# Patient Record
Sex: Male | Born: 1935 | Race: White | Hispanic: No
Health system: Western US, Academic
[De-identification: ages and names within clinical notes are randomized; demographics above are authoritative.]

## PROBLEM LIST (undated history)

## (undated) DIAGNOSIS — M629 Disorder of muscle, unspecified: Secondary | ICD-10-CM

## (undated) DIAGNOSIS — I719 Aortic aneurysm of unspecified site, without rupture: Secondary | ICD-10-CM

## (undated) DIAGNOSIS — G4730 Sleep apnea, unspecified: Secondary | ICD-10-CM

## (undated) HISTORY — PX: PR REVJ TOT KNEE ARTHRP FEM&ENTIRE TIBIAL COMPONE: 27487

## (undated) HISTORY — PX: SURGICAL HX OTHER: 99

## (undated) HISTORY — PX: PR RPR 1ST INGUN HRNA AGE 6 MO-5 YRS REDUCIBLE: 49500

## (undated) HISTORY — DX: Aortic aneurysm of unspecified site, without rupture: I71.9

## (undated) HISTORY — PX: VASECTOMY W/TRAY: 6200

## (undated) HISTORY — DX: Sleep apnea, unspecified: G47.30

## (undated) HISTORY — PX: LIGATION PRQ VAS DEFERENS UNI/BI SPX: 55450

## (undated) HISTORY — DX: Disorder of muscle, unspecified: M62.9

## (undated) MED ORDER — SYMBICORT 160-4.5 MCG/ACT IN AERO
INHALATION_SPRAY | RESPIRATORY_TRACT | 2 refills | Status: AC
Start: 2016-09-22 — End: ?

## (undated) MED ORDER — ATENOLOL 50 MG OR TABS
ORAL_TABLET | ORAL | Status: AC
Start: 2010-03-28 — End: ?

## (undated) MED ORDER — TAMSULOSIN HCL 0.4 MG OR CAPS
ORAL_CAPSULE | ORAL | Status: AC
Start: 2010-12-12 — End: ?

## (undated) MED ORDER — FINASTERIDE 5 MG OR TABS
ORAL_TABLET | ORAL | Status: AC
Start: 2010-11-20 — End: ?

## (undated) DEATH — deceased

---

## 1992-06-14 ENCOUNTER — Other Ambulatory Visit (HOSPITAL_BASED_OUTPATIENT_CLINIC_OR_DEPARTMENT_OTHER): Payer: Self-pay

## 1997-12-19 ENCOUNTER — Other Ambulatory Visit (HOSPITAL_BASED_OUTPATIENT_CLINIC_OR_DEPARTMENT_OTHER): Payer: Self-pay | Admitting: Family Medicine

## 1998-07-01 ENCOUNTER — Other Ambulatory Visit (HOSPITAL_BASED_OUTPATIENT_CLINIC_OR_DEPARTMENT_OTHER): Payer: Self-pay | Admitting: Gastroenterology

## 1998-07-03 LAB — PATHOLOGY, SURGICAL

## 1999-05-29 ENCOUNTER — Encounter: Payer: Self-pay | Admitting: Specialist

## 1999-06-13 ENCOUNTER — Encounter: Payer: Self-pay | Admitting: Specialist

## 1999-09-30 ENCOUNTER — Encounter: Payer: Self-pay | Admitting: Specialist

## 1999-10-15 ENCOUNTER — Encounter: Payer: No Typology Code available for payment source | Admitting: Podiatrist

## 1999-10-20 ENCOUNTER — Other Ambulatory Visit (HOSPITAL_BASED_OUTPATIENT_CLINIC_OR_DEPARTMENT_OTHER): Payer: Self-pay | Admitting: Specialist

## 1999-10-20 ENCOUNTER — Encounter: Payer: No Typology Code available for payment source | Admitting: Specialist

## 1999-10-23 ENCOUNTER — Encounter: Payer: No Typology Code available for payment source | Admitting: Specialist

## 1999-11-05 ENCOUNTER — Encounter: Payer: No Typology Code available for payment source | Admitting: Podiatrist

## 2000-03-03 ENCOUNTER — Encounter: Payer: No Typology Code available for payment source | Admitting: Family Medicine

## 2000-07-13 ENCOUNTER — Encounter: Payer: No Typology Code available for payment source | Admitting: Family Medicine

## 2000-07-14 ENCOUNTER — Encounter: Payer: No Typology Code available for payment source | Admitting: Orthopaedic Surgery

## 2000-07-14 ENCOUNTER — Encounter: Payer: No Typology Code available for payment source | Admitting: Podiatrist

## 2000-08-12 ENCOUNTER — Encounter: Payer: No Typology Code available for payment source | Admitting: Orthopaedic Surgery

## 2000-08-17 ENCOUNTER — Encounter: Payer: No Typology Code available for payment source | Admitting: Podiatrist

## 2000-08-24 ENCOUNTER — Encounter: Payer: No Typology Code available for payment source | Admitting: Family Medicine

## 2000-09-09 ENCOUNTER — Encounter: Payer: No Typology Code available for payment source | Admitting: Orthopaedic Surgery

## 2000-09-22 ENCOUNTER — Encounter: Payer: No Typology Code available for payment source | Admitting: Family Medicine

## 2000-10-13 ENCOUNTER — Encounter: Payer: No Typology Code available for payment source | Admitting: Orthopaedic Surgery

## 2000-12-23 ENCOUNTER — Encounter: Payer: No Typology Code available for payment source | Admitting: Orthopaedic Surgery

## 2001-01-13 ENCOUNTER — Encounter: Payer: Self-pay | Admitting: Family Medicine

## 2001-01-28 ENCOUNTER — Encounter: Payer: No Typology Code available for payment source | Admitting: Otolaryngology

## 2001-05-04 ENCOUNTER — Encounter: Payer: No Typology Code available for payment source | Admitting: Family Medicine

## 2001-05-11 ENCOUNTER — Other Ambulatory Visit: Payer: No Typology Code available for payment source

## 2001-05-31 ENCOUNTER — Encounter: Payer: No Typology Code available for payment source | Admitting: Orthopaedic Surgery

## 2001-08-23 ENCOUNTER — Encounter: Payer: No Typology Code available for payment source | Admitting: Orthopaedic Surgery

## 2001-08-25 ENCOUNTER — Encounter: Payer: No Typology Code available for payment source | Admitting: Family Medicine

## 2001-10-03 ENCOUNTER — Other Ambulatory Visit (HOSPITAL_BASED_OUTPATIENT_CLINIC_OR_DEPARTMENT_OTHER): Payer: Self-pay | Admitting: Gastroenterology

## 2001-10-03 ENCOUNTER — Encounter: Payer: No Typology Code available for payment source | Admitting: Gastroenterology

## 2001-10-05 LAB — PATHOLOGY, SURGICAL

## 2001-10-11 ENCOUNTER — Encounter: Payer: No Typology Code available for payment source | Admitting: Orthopaedic Surgery

## 2002-03-21 ENCOUNTER — Encounter: Payer: Self-pay | Admitting: Orthopaedic Surgery

## 2002-03-21 ENCOUNTER — Encounter: Payer: No Typology Code available for payment source | Admitting: Orthopaedic Surgery

## 2002-03-29 ENCOUNTER — Encounter: Payer: Self-pay | Admitting: Medical

## 2002-03-29 ENCOUNTER — Ambulatory Visit (INDEPENDENT_AMBULATORY_CARE_PROVIDER_SITE_OTHER): Payer: Medicare Other | Admitting: Family Medicine

## 2002-03-29 ENCOUNTER — Encounter (INDEPENDENT_AMBULATORY_CARE_PROVIDER_SITE_OTHER): Payer: Self-pay | Admitting: Family Medicine

## 2002-03-29 VITALS — BP 150/70 | HR 88 | Temp 96.8°F | Resp 16 | Ht 70.5 in | Wt 218.0 lb

## 2002-03-29 DIAGNOSIS — C020 Malignant neoplasm of dorsal surface of tongue: Secondary | ICD-10-CM

## 2002-03-29 HISTORY — DX: Malignant neoplasm of dorsal surface of tongue: C02.0

## 2002-03-29 MED ORDER — ALBUTEROL 90 MCG/ACT IN AERS
INHALATION_SPRAY | RESPIRATORY_TRACT | Status: DC
Start: 2002-03-29 — End: 2004-12-17

## 2002-03-29 MED ORDER — ZITHROMAX 250 MG OR TABS
ORAL_TABLET | ORAL | Status: AC
Start: 2002-03-29 — End: 2002-04-03

## 2002-03-29 NOTE — Progress Notes (Signed)
Pt c/o saturday with sore throat, The initial symptoms were sore throat and then the chills, upper body aches, cough, plugged ears all started yesterday. Temperature of 99.8 degrees.    There is a constant ache over the left axilla since three days ago.    Cough is productive of thick yellow phlegm.    No ill contacts. Had flu shot 3-4 weeks ago.    PHYSICAL EXAM:  General: healthy, alert, no distress  Skin: Skin color, texture, turgor normal. No rashes or concerning lesions.  Head: Normocephalic. No masses, lesions, tenderness or abnormalities  Eyes: Lids/periorbital skin normal, Conjunctivae/corneas clear, PERRL, EOM's intact  Oropharynx: Lips, mucosa, and tongue normal. Teeth and gums normal., posterior pharynx without erythema or drainage  Lungs: scattered rhales on left lower lung fields.  Heart: normal rate, regular rhythm and no murmurs, clicks, or gallops  Back: Back symmetric, no deformity; ROM normal; No CVA tenderness.    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    A/P    1) Bronchitis VS early pneumonia. Zithromax with albuterol.    2) Follow up with PMD for cardiac evaluation.

## 2002-04-18 ENCOUNTER — Encounter: Payer: Medicare Other | Admitting: Family Medicine

## 2002-07-11 ENCOUNTER — Encounter: Payer: Medicare Other | Admitting: Orthopaedic Surgery

## 2002-08-02 ENCOUNTER — Encounter: Payer: Medicare Other | Admitting: Orthopaedic Surgery

## 2002-09-06 ENCOUNTER — Encounter: Payer: Self-pay | Admitting: Orthopaedic Surgery

## 2002-09-18 ENCOUNTER — Telehealth (INDEPENDENT_AMBULATORY_CARE_PROVIDER_SITE_OTHER): Payer: Self-pay | Admitting: Family Medicine

## 2002-09-18 NOTE — Telephone Encounter (Signed)
>>   Tracy Conway Mon Sep 18, 2002 7:18 PM  Effort made to contact patient by phone to have a lipid test performed. No answer, no message left.

## 2002-09-19 ENCOUNTER — Telehealth (INDEPENDENT_AMBULATORY_CARE_PROVIDER_SITE_OTHER): Payer: Self-pay | Admitting: Family Medicine

## 2002-09-19 NOTE — Telephone Encounter (Signed)
>>   Tracy Conway Tue Sep 19, 2002 7:03 PM  Effort made to contact patient by phone to have a lipid test performed. No answer, no message left.  Letter Sent

## 2002-10-12 ENCOUNTER — Encounter: Payer: Medicare Other | Admitting: Family Medicine

## 2002-10-17 ENCOUNTER — Other Ambulatory Visit: Payer: Medicare Other

## 2002-10-24 ENCOUNTER — Encounter: Payer: Medicare Other | Admitting: Family Medicine

## 2002-11-21 ENCOUNTER — Encounter: Payer: Medicare Other | Admitting: Surgery

## 2002-12-12 ENCOUNTER — Encounter: Payer: Medicare Other | Admitting: Surgery

## 2003-01-23 ENCOUNTER — Encounter: Payer: Medicare Other | Admitting: Cardiac Surgery

## 2003-01-26 ENCOUNTER — Other Ambulatory Visit: Payer: Medicare Other

## 2003-02-02 ENCOUNTER — Other Ambulatory Visit: Payer: Medicare Other

## 2003-02-13 ENCOUNTER — Encounter: Payer: Medicare Other | Admitting: Cardiac Surgery

## 2003-02-14 ENCOUNTER — Encounter: Payer: Medicare Other | Admitting: Family Medicine

## 2003-03-20 ENCOUNTER — Encounter: Payer: Medicare Other | Admitting: Family Medicine

## 2003-05-21 ENCOUNTER — Other Ambulatory Visit: Payer: Medicare Other

## 2003-05-29 ENCOUNTER — Encounter: Payer: Medicare Other | Admitting: Cardiac Surgery

## 2003-08-27 ENCOUNTER — Encounter: Payer: No Typology Code available for payment source | Admitting: Family Practice

## 2003-08-27 ENCOUNTER — Encounter (HOSPITAL_COMMUNITY): Payer: No Typology Code available for payment source

## 2003-11-21 ENCOUNTER — Encounter: Payer: MEDICARE | Admitting: Family Medicine

## 2003-11-26 ENCOUNTER — Other Ambulatory Visit: Payer: Self-pay

## 2003-12-03 ENCOUNTER — Other Ambulatory Visit: Payer: No Typology Code available for payment source

## 2003-12-04 ENCOUNTER — Encounter: Payer: MEDICARE | Admitting: Cardiac Surgery

## 2004-02-11 ENCOUNTER — Encounter: Payer: PRIVATE HEALTH INSURANCE | Admitting: Family Medicine

## 2004-05-13 ENCOUNTER — Encounter: Payer: MEDICARE | Admitting: Cardiovascular Disease

## 2004-07-09 ENCOUNTER — Encounter: Payer: MEDICARE | Admitting: Cardiology

## 2004-11-18 ENCOUNTER — Other Ambulatory Visit: Payer: Self-pay | Admitting: Family Medicine

## 2004-12-01 ENCOUNTER — Other Ambulatory Visit: Payer: MEDICARE

## 2004-12-01 ENCOUNTER — Other Ambulatory Visit: Payer: Self-pay | Admitting: Family Medicine

## 2004-12-01 LAB — COMPREHENSIVE METABOLIC PANEL
ALT (GPT): 20 U/L (ref 12–59)
AST (GOT): 23 U/L (ref 10–44)
Albumin: 3.9 g/dL (ref 3.5–5.2)
Alkaline Phosphatase (Total): 47 U/L (ref 36–161)
Anion Gap: 5 (ref 5–15)
Bilirubin (Total): 1 mg/dL (ref 0.2–1.3)
Calcium: 9.7 mg/dL (ref 8.9–10.2)
Carbon Dioxide, Total: 27 mEq/L (ref 22–32)
Chloride: 108 mEq/L (ref 98–108)
Creatinine: 1.9 mg/dL — ABNORMAL HIGH (ref 0.3–1.2)
Glucose: 102 mg/dL (ref 62–125)
Potassium: 4.7 mEq/L (ref 3.7–5.2)
Protein (Total): 6.7 g/dL (ref 6.0–8.2)
Sodium: 140 mEq/L (ref 136–145)
Urea Nitrogen: 35 mg/dL — ABNORMAL HIGH (ref 8–21)

## 2004-12-01 LAB — CK, CREATINE KINASE, TOTAL ACTIVITY: Creatinine Kinase Total Activity: 152 U/L (ref 30–285)

## 2004-12-01 LAB — LIPID PANEL
Cholesterol (LDL): 90 mg/dL (ref <130)
Cholesterol/HDL Ratio: 4.1
HDL Cholesterol: 32 mg/dL — ABNORMAL LOW (ref >40)
Total Cholesterol: 132 mg/dL (ref <200)
Triglyceride: 50 mg/dL (ref <150)

## 2004-12-01 LAB — PSA, SCREENING: PSA, Diagnostic/Monitoring: 4.61 ng/mL — ABNORMAL HIGH (ref 0.00–4.00)

## 2004-12-09 ENCOUNTER — Encounter: Payer: MEDICARE | Admitting: Cardiac Surgery

## 2004-12-15 ENCOUNTER — Encounter: Payer: Self-pay | Admitting: Family Medicine

## 2004-12-15 NOTE — Progress Notes (Signed)
Tracy Conway, Tracy Conway [id number]  Family Medicine - Outpt Record Authenticated  Service date: 11-Feb-2004  Dictated by Burgess Estelle, MD, Thurnell Lose on 21-Feb-2004    Family Medicine - Outpt Record    REASON FOR VISIT: FOLLOWUP HYPERTENSION.    SUBJECTIVE: Tracy Conway has been diagnosed incidentally with an abdominal aortic aneurysm for which he has seen Dr. Brunilda Payor in Cardiac Surgery. His goal systolic blood pressure is supposed to be between 100 and 120. Almost all of his blood pressure readings have been above that level.    CURRENT MEDICATIONS:   1. Nifedipine SR 90 mg po daily.    2. Lotensin which he increased to 40 mg po daily.    3. Zocor 40 mg po daily.    4. Atenolol, which was at 25 mg daily, which he has gone up to 50 mg po daily and is doing well on.    5. Albuterol.    6. ASA.    7. Glucosamine.    8. Clobetasol ointment.    EXAMINATION: The patient alert, cooperative, oriented times three in NAD. Weight is 224 lb, blood pressure is 130/72 with a pulse of 62 on our machine, and was 131/75 on the patient's machine. Therefore, there seems to be good correlation between the patient's readings and our readings. The patient has brought in a very nicely laid out list of all of his blood pressure readings since August 30th. It does appear that a number of the readings are quite far above the range where the patient should be.    ASSESSMENT AND PLAN: Hypertension under less than adequate control given his aneurysm of the ascending aorta and small abdominal aortic aneurysm. I am a little reticent to go up on atenolol right now because the patient's pulse is fairly low, though we could potentially try to push this medication. At this point I think that what we will try to do is increase his Lotensin to a total of 60 mg a day. He will now take 20 mg tablets, two tablets in the morning, and one tablet in the evening. He was given a prescription for #270 with prn refills. I also refilled his atenolol. I would like to see him  again in about six weeks time for further assessment of his blood pressure. If it is not ideal at that time, then we will consider either going up with the atenolol, or perhaps adding a diuretic. The patient was happy with this plan. He will return to clinic prn. Of note is that he should have another comprehensive metabolic panel, and probably cholesterol panel done in about December or January.            Signature Line  Electronically Reviewed/Signed On: 03/27/04 at 14:07   _______________________________________   Tana Coast, MD   Attending, Department of [facility] Medicine   [address], [address]   [place], [place]                               CET/OM   DD:02/21/04   TD:02/22/04       [phone]         CC Address Information   none        ------------------------------------------------------------------Tracy Conway, Tracy Conway [id number]  Family Medicine - Outpt Record Authenticated  Service date: 21-Nov-2003  Dictated by Burgess Estelle, MD, Thurnell Lose on 21-Nov-2003    Family Medicine - Outpt Record      REASON FOR VISIT:  YEARLY PHYSICAL EXAM.    SUBJECTIVE: The patient is a very pleasant 70 year old gentleman who continues to work at [employer]. He is doing remarkably well, especially considering his rather extensive past medical history. His only complaint now is of achy joints, mostly his shoulders and his knees.    PAST MEDICAL HISTORY: This was extensively reviewed.    1. 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 He had partial glossectomy in 1994, along with lymph node dissection on that side of his neck.    2. Hypertension.    3. History of tobacco use, quit in the year 2000.    4. 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    5. History of alcohol abuse, quit in 1986.    6. Bilateral hearing aids.    7. Eczema, lower extremities.    8. Hypercholesterolemia.    9. Erectile dysfunction.    10. Tubular adenoma. Most recent colonoscopy was in 2003 which also showed mild  diverticulosis. Followup was recommended in 2008 with a CT colonograph because of difficulty in reaching the terminal ilium with the scope because of a tortuous colon. If the CT scan showed significant polyp regrowth, then colonoscopy would need to be repeated.    11. History of proteinuria with mild renal failure with a creatinine in the region of 1.3 to 1.5, stable over the last several years.    12. Nephrolithiasis in 1988, status post lithotripsy.    13. Episodes of pericarditis in 1977 and 1988 that apparently did not require hospitalization.    14. Incidental findings on CT scan of lung nodules which have been stable and are likely benign.    15. Incidental finding of ascending aortic aneurysm for which patient has been seen by Cardiothoracic Surgery, Dr. Brunilda Payor. He requires CT scans every six months to assess the stability of the aneurysm. Dr. Brunilda Payor also recommends that his blood pressure be kept under fairly tight control, in the region of 125/60s.    16. His past medical history on [employer] indicates that he has a past history of hepatitis C. This is not correct. 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 He has recovered well from that, and liver function tests and liver enzyme tests are unremarkable.    PAST SURGICAL HISTORY:    1. Ventral and umbilical hernias repaired in 1998 and 1969.    2. History of hallux rigidus with a left first MTP joint replaced by Dr. Lamar Benes in 2002.    3. Status post carpal tunnel surgeries; left hand in May 2003, right hand in June 2003.    4. Status post right trigger thumb release done in April 2004 by Dr. Clent Ridges.    5. Hemorrhoidectomy, July 2004.    CURRENT MEDICATIONS:    1. Nifedipine SR 90 mg po daily. Patient reports that sometimes this medication is seen in his stool completely undissolved. Therefore, he has started cutting them in half in order to have the medication absorbed into his system. The undissolved capsule is seen only rarely. I let  him know that there could be concern that the sustained release form of the medication would no longer be active and that he would be taking short-acting nifedipine if he did cut the medications in half, and short-acting nifedipine is associated with increased cardiac risk.    2. Lotensin 30 mg po daily.    3. Zocor 40 mg po daily.    4.  Atenolol 25 mg po daily.    5. Albuterol prn. He has not used this in many years.    6. ASA 81 mg po daily.    7. Glucosamine daily.    8. Saw palmetto daily.    9. Clobetasol 0.05% ointment, which he uses in conjunction with Ultimate Aloe Moisturizer that he gets over the Internet, which helps a lot with his eczema.    HABITS: Currently a nonsmoker. No alcohol.    REVIEW OF SYSTEMS: The patient is concerned about his prostate as his father has a history of prostate cancer. He has noticed a little bit of decreased urine flow. He only has nocturia times one. No postvoid dribbling or other problems. No problems getting his stream started or stopped. He gets occasional shortness of breath with activity, but he still is able to do essentially whatever he wants.    HEALTH CARE MAINTENANCE ISSUES: He recently had a cholesterol profile drawn that came back within normal limits. His creatinine was 1.4. Liver tests were unremarkable. PSA was normal. He is trying to follow a diet and to remain active. His last tetanus shot was in 2004. He had a Pneumovax shot in 2004.    PHYSICAL EXAMINATION: Patient alert, cooperative, oriented times three in NAD. Weight is 221 lb, which is essentially stable over the last few years. Height 70", blood pressure 138/68, pulse 60 and regular. Head and neck exam reveals EOMs full. Oropharynx unremarkable. There is a scar along the lateral border of the left side of his tongue. No increased cervical lymph nodes. He does have scarring from previous surgery along the left side of his neck. Auscultation of the chest reveals somewhat decreased air entry bilaterally,  but air entry present to bases bilaterally. No crackles or wheezes are heard. Heart sounds are somewhat distant, but S1 and S2 sound normal. I do not hear any S3 or S4 or murmurs. Abdomen: Mildly obese, soft, nontender. No detectable organomegaly or masses. I am unable to feel an abdominal aortic aneurysm. Extremities: No cyanosis. No edema. The patient actually has good peripheral pulses in the posterior tibial areas bilaterally.    ASSESSMENT AND PLAN: A 70 year old gentleman with extensive past medical history but currently doing well and is stable. He was given refills on his medications. We need to watch his blood pressure to keep it in the region of 125/60s. He should return to clinic in several months for a blood pressure recheck. He does have a blood pressure cuff at home, and he monitors his blood pressure there, as well. Otherwise, remain on current medications. He will return to clinic if he has any further problems.    ADDENDUM 12/14/03: Received email from patient that his blood pressure readings are going up somewhat. Will increase his Atenolol to 50 mg po qd. Another prescription reflecting this dosage change was mailed to the patient.      Signature Line  Electronically Reviewed/Signed On: 12/14/03 at 11:51   _______________________________________   Tana Coast, MD   Attending, Department of [facility] Medicine   [address], [address]   [place], [place]                               CET/OM   DD:11/21/03   TD:11/22/03       [phone]         CC Address Information   none

## 2004-12-17 ENCOUNTER — Ambulatory Visit: Payer: MEDICARE | Admitting: Family Medicine

## 2004-12-17 VITALS — BP 118/58 | HR 68 | Wt 219.0 lb

## 2004-12-17 LAB — BASIC METABOLIC PANEL
Anion Gap: 14 (ref 5–15)
Calcium: 9.7 mg/dL (ref 8.9–10.2)
Carbon Dioxide, Total: 25 mEq/L (ref 22–32)
Chloride: 108 mEq/L (ref 98–108)
Creatinine: 1.6 mg/dL — ABNORMAL HIGH (ref 0.3–1.2)
Glucose: 90 mg/dL (ref 62–125)
Potassium: 5.3 mEq/L — ABNORMAL HIGH (ref 3.7–5.2)
Sodium: 147 mEq/L — ABNORMAL HIGH (ref 136–145)
Urea Nitrogen: 35 mg/dL — ABNORMAL HIGH (ref 8–21)

## 2004-12-17 MED ORDER — LIPITOR 40 MG OR TABS
ORAL_TABLET | ORAL | Status: DC
Start: 2004-12-17 — End: 2005-02-05

## 2004-12-17 MED ORDER — HYDROCHLOROTHIAZIDE 12.5 MG OR CAPS
ORAL_CAPSULE | ORAL | Status: DC
Start: 2004-12-17 — End: 2005-02-05

## 2004-12-17 MED ORDER — ATENOLOL 50 MG OR TABS
ORAL_TABLET | ORAL | Status: DC
Start: 2004-12-17 — End: 2005-02-05

## 2004-12-17 MED ORDER — LOTENSIN 20 MG OR TABS
ORAL_TABLET | ORAL | Status: DC
Start: 2004-12-17 — End: 2005-02-05

## 2004-12-17 NOTE — Progress Notes (Signed)
Addended by: Sherlean Foot on: 12/18/2004 12:09:57 PM     Modules accepted: Orders      -----------------------------------------------  FOB BASIC SERVICES  Patient checked in: YES  Vitals: YES  Medications reviewed: YES     FU visit and/or single test scheduled: NO  Room Use in Excess of 25 Minutes: no  Room Use Additional 25 Minutes : no  Chaperoning (other than procedures): no    Total number of YES responses: 3  Completed by: Floria Raveling      ------------------------------------------   Tracy Conway is a 70 year old male here for an annual health maintenance exam. Other concerns today include the following:     1) Fatigue with exertion. pt wonders if this is due to Lipitor. Nurse in Cardiology seemed to think it might be. Pt does not get muscle pains, just "drags" after about 10 minutes of gardening or working around the house. He still manages to complete tasks, but has to pace himself. He did not feel this way when he was on Zocor. He does not get chest pain on exertion and was recently seen by Dr. Roseanne Reno in Cardiology clinic who did not think he had problems with ischemia. Pt has a stress echo in 2003 that did not show signs of ischemia. Pt is also on Atenolol 50 mg qd which might be contributing to exercise intolerance.    2) Abnormal recent PSA. this just came back slightly elevated at 4.61 which is a change for him. Pt does also report mild slowing of urinary stream.    3) Elevated Creatinine. Pt has history of mild chronic renal failure, perhaps secondary to HTN, with Creat generally around 1.2-1.3. last Creat up to 1.9. Also had elevated BUN. Pt wonders if this is related to dehydration as it was very hot at the time he had the test, and he was doing heavy work and was not drinking much. He also noted that his BP was quite low at that time.    4) Pt recently saw Dr. Brunilda Payor to f/u ascending aortic aneurysm, had repeat CT. BP has been under excellent control (goal systolic 100-120). No change  in aneurysm.    Current dietary habits: has modified his diet substantially after seeing Dr. Roseanne Reno. Avoiding simple starches, eating healthier grains, watching cholesterol. Has been successful in losing some weight. Was 227, now 219.  Current exercise habits: walks daily.    Patient Active Problem List:    HYPERTENSION NOS(aka HTN)[401.9]   Priority: High [1]   Date Noted: 03/29/2002   PURE HYPERCHOLESTEROLEM[272.0]   Priority: High [1]   Date Noted: 03/29/2002   MALIG NEOPLASM DORSAL TONGUE(aka CANCER)[141.1]   Priority: High [1]   Date Noted: 03/29/2002   Comment: Surgery ten years ago          There is no previous medical history on file.    There is no previous surgical history on file.    No family history on file      MEDICATIONS: See list below    Social History   Marital Status: Married Spouse Name:    Years of Education: Number of children:     Occupational History   None on file    Social History Main Topics   Tobacco Use: Quit Packs/Day: Years:    Comment: quit three years ago   Alcohol Use: No    Comment: quit 17 years ago   Drug Use: Not on file    Sexual Activity: Not on  file     Other Topics Concern   None on file    Social History Narrative   None on file          REVIEW OF SYSTEMS:    CONSTITUTIONAL: Denies, daytime somnolence, weight gain, fever, chills, night sweats    NEUROLOGIC: Denies, headaches, dizziness, syncope, focal weakness and any neurological problems    OPHTHALMIC: Denies, any vision problems    ENT: Denies, ear pain, nasal congestion and any ear nose or throat problems: though does have bilat hearing aids    RESPIRATORY: Denies, dyspnea at rest, cough, wheeze, known nighttime apneas, any respiratory problems, . Does have, dyspnea on exertion     CARDIOVASCULAR: Denies, chest pain, palpitations, paroxysmal nocturnal dyspnea, claudication    GI: Denies, change in appetite, dysphagia, nausea, vomiting, dyspepsia, abdominal pain, abdominal bloating, diarrhea, constipation,  hematochezia, melena, . Does have stools that are brownish-yellowish, soft easy to pass.    GENITOURINARY: Denies, penile sores, urethral discharge, testicular swelling, testicular pain, any genito-urinary problems, . Does have, erectile dysfunction for many years. takes saw palmetto, urinary hesitancy : see hpi.    INTEGUMENTARY: Denies, any skin problems    RHEUMATOLOGIC/MSK: Denies, arthralgias, any joint or arthritic problems    ENDOCRINE: Denies, polyuria, polydipsia    PSYCHOSOCIAL: Denies, dysthymia, sleep disturbance, anxiety and any psychological problems          PHYSICAL EXAM:      General: healthy, alert, no distress  Evident difficulty hearing: YES--only in that he is wearing bilat hearing aids.  Skin: Skin color, texture, turgor normal. No rashes or concerning lesions., prominent vasculature in lower extremities.  Head: Normocephalic. No masses, lesions, tenderness or abnormalities  Eyes: Lids/periorbital skin normal, Conjunctivae/corneas clear, PERRL, EOM's intact  Ears: exam deferred,   Nose:exam deferred  Oropharynx: Lips, mucosa, and tongue normal. Teeth and gums normal., posterior pharynx without erythema or drainage, though scar left side of tongue from prior cancer surgery.  Neck: Neck supple. No adenopathy. Thyroid symmetric, normal size, without nodules and scarring left side of neck from prior surgery for oral cancer. Had lymphnodes removed, but he seems to have a small non-tender mobile palpable LNat region of L tonsillar area.  Lungs: clear to auscultation though breath sounds somewhat distant in keeping with history of emphysema.  Heart: soft heart sounds. S1 and S2 present. no murmurs heard.  Abd: soft, non-tender. BS normal. No masses or organomegaly  GU: Penis: no lesions or discharge. Testes: no masses or tenderness. No hernia.  Rectal: sphincter tone normal, no mass and stool guaiac negative  Prostate: Normal size, symmetrical bilaterally, no nodules or tenderness, though difficult to  feel all ht way around the prostate.  Ext: Normal, without deformities, very minimal ankle edema, no skin discoloration, radial and post-tibial pulses 2+ bilaterally,  Neuro: Grossly normal to observation, gait normal    ASSESSMENT AND PLAN:    1. Routine screening:   Total nonfasting cholesterol: YES done in last several weeks. Results discussed. HDL a bit low.   Screen for Type 2 DM w/fasting plasma glucose: YES recent blood glucose normal.   PSA: YES see hpi.   TSH for hypothyroidism: NO   Colon Ca screening with Hemoccults x 3: N/A will be due for f/u WITH COLONOSCOPY AND CT COLONOGRAPHY in 2008      Screening for AAA with ultrasound: YES cT SCAN JUST DONE WHICH SHOWS ONLY BODERLINE ABDOMINAL AORTIC ANEURYSM, AND STABLE ASCENDING AORTIC ANEURYSM.   Screening for STD's including  HIV: NO   Screening for hepatitis C: NO   Vision screening: NO    2. Immunizations today:   Td: NO      Pneumococcal: NO Had immunization in 2004   Influenza: NO   Hep A vaccine: NO    3. Preventive counseling: healthy dietary guidelines, proper exercise    4. Re fatigue with exertion. Unclear if this is due to Lipitor. We discussed Crestor. Since it is a similar med, no sure if there would be an advantage, but will also look into whether it might be better for HDL. Will watch for now. i wonder if this symptom might be due to Atenolol.    5. Abnormal PSA: will refer to Urology, also because of mild urinary hesitancy symptoms.    6. Elevated Creat: will repeat it and BUN today. Also decided to reduce benazepril to 20 mg qd, which was also what Dr. Roseanne Reno had suggested (though pt did not implement) because of hyperkalemia, though pt does not have that any more.    This note was generated using a standard template approved by the Medical Director.

## 2004-12-18 LAB — PSA, SCREENING: PSA, Diagnostic/Monitoring: 3.53 ng/mL (ref 0.00–4.00)

## 2005-01-09 ENCOUNTER — Encounter: Payer: MEDICARE | Admitting: Registered Nurse

## 2005-02-05 ENCOUNTER — Other Ambulatory Visit: Payer: Self-pay | Admitting: Family Medicine

## 2005-02-05 MED ORDER — NIFEDIPINE 90 MG OR TBCR
EXTENDED_RELEASE_TABLET | ORAL | Status: DC
Start: 2005-02-05 — End: 2005-10-09

## 2005-02-05 MED ORDER — LIPITOR 40 MG OR TABS
ORAL_TABLET | ORAL | Status: DC
Start: 2005-02-05 — End: 2005-10-09

## 2005-02-05 MED ORDER — ATENOLOL 50 MG OR TABS
ORAL_TABLET | ORAL | Status: DC
Start: 2005-02-05 — End: 2005-10-09

## 2005-02-05 MED ORDER — HYDROCHLOROTHIAZIDE 12.5 MG OR CAPS
ORAL_CAPSULE | ORAL | Status: DC
Start: 2005-02-05 — End: 2005-10-09

## 2005-02-05 MED ORDER — LOTENSIN 20 MG OR TABS
ORAL_TABLET | ORAL | Status: DC
Start: 2005-02-05 — End: 2005-10-09

## 2005-02-05 NOTE — Progress Notes (Signed)
Pt requires new prescriptions for his medications because his insurance changed.    Sherlean Foot, MD

## 2005-05-08 ENCOUNTER — Encounter: Payer: Self-pay | Admitting: Cardiovascular Disease

## 2005-07-21 ENCOUNTER — Ambulatory Visit: Payer: MEDICARE | Admitting: Family Practice

## 2005-07-21 ENCOUNTER — Ambulatory Visit: Payer: PPO

## 2005-07-21 VITALS — BP 112/62 | HR 60 | Temp 98.4°F | Wt 226.0 lb

## 2005-07-21 LAB — COMPREHENSIVE METABOLIC PANEL
ALT (GPT): 17 U/L (ref 10–48)
AST (GOT): 20 U/L (ref 15–40)
Albumin: 3.8 g/dL (ref 3.5–5.2)
Alkaline Phosphatase (Total): 45 U/L (ref 36–161)
Anion Gap: 9 (ref 5–15)
Bilirubin (Total): 1.4 mg/dL — ABNORMAL HIGH (ref 0.2–1.3)
Calcium: 9.2 mg/dL (ref 8.9–10.2)
Carbon Dioxide, Total: 26 mEq/L (ref 22–32)
Chloride: 103 mEq/L (ref 98–108)
Creatinine: 1.9 mg/dL — ABNORMAL HIGH (ref 0.3–1.2)
Glucose: 103 mg/dL (ref 62–125)
Potassium: 3.8 mEq/L (ref 3.7–5.2)
Protein (Total): 6.8 g/dL (ref 6.0–8.2)
Sodium: 138 mEq/L (ref 136–145)
Urea Nitrogen: 31 mg/dL — ABNORMAL HIGH (ref 8–21)

## 2005-07-21 LAB — CBC (HEMOGRAM)
Hematocrit: 40 % (ref 38–50)
Hemoglobin: 13.3 g/dL (ref 13.0–18.0)
MCH: 28.9 pg (ref 27.3–33.6)
MCHC: 32.8 g/dL (ref 32.3–35.7)
MCV: 88 fL (ref 81–98)
Platelet Count: 196 10*3/uL (ref 150–400)
RBC: 4.6 mil/uL (ref 4.40–5.60)
WBC: 11.1 10*3/uL — ABNORMAL HIGH (ref 4.3–10.0)

## 2005-07-21 LAB — URINALYSIS COMPLETE, URN: Specific Gravity, URN: 1.02 g/mL (ref 1.002–1.02)

## 2005-07-21 LAB — DIFF/SMEAR EVALUATION ADD
Absolute Eosinophil Count: 0.22 10*3/uL (ref 0–0.50)
Absolute Lymphocyte Count: 2.22 10*3/uL (ref 1.00–4.80)
Basophils: 0 10*3/uL (ref 0–0.20)
Monocytes: 0.67 10*3/uL (ref 0–0.80)
Neutrophils: 7.99 10*3/uL — ABNORMAL HIGH (ref 1.80–7.00)

## 2005-07-21 NOTE — Progress Notes (Signed)
S:Elderly male in today with CC of RLQ abdominal pain for 1.5 days. Symptoms came on gradually and have persisted.Pain described as crampy. Severity 8/10 last evening, 3-4/10 now. He developed chills and fever reported at 103 degrees at 9:00 pm last evening.Symptoms aggravated by palpation or certain movements.He has no interest in eating though he is hungry. He denies nausea, vomiting, diarrhea, changes in stool shape or color. Denies shortness of breath, chest pain. No changes in urine output, no dysuria, no flank pain.    Patient Active Problem List:   HYPERTENSION NOS(aka HTN) [401.9]   PURE HYPERCHOLESTEROLEM [272.0]   MALIG NEOPLASM DORSAL TONGUE(aka CANCER) [141.1]    Review of patient's allergies indicates:   -- Penicillin G    -- hives     No current hospital medications on file as of 07/21/05.  Outpatient prescriptions as of 07/21/05:  LOTENSIN 20 MG OR TABS, Take 1 tablet by mouth every day, Disp: 90, Rfl: prn  ATENOLOL 50 MG OR TABS, 1 TABLET DAILY, Disp: 90, Rfl: prn  LIPITOR 40 MG OR TABS, 1 TABLET DAILY, Disp: 90, Rfl: prn  HYDROCHLOROTHIAZIDE 12.5 MG OR CAPS, 1 TABLET DAILY, Disp: 90, Rfl: prn  NIFEDIPINE 90 MG OR TBCR, Take 1 tablet by mouth every day, Disp: 90, Rfl: 0  ASPIRIN 81 MG OR TABS, 1 TABLET DAILY, Disp: , Rfl:   CLINDAMYCIN HCL 300 MG OR CAPS, 2 caps prn prior to dental surgery, Disp: , Rfl:     O: Pleasant male in NAD. BP 112/62   Pulse 60   Temp (Src) 98.4 F (36.9 C) (Temporal)   Wt 226 lbs (102.513 kg)  Abdomen reveals tenderness in the RLQ, with rebound tenderness noted. Mild guarding noted. No palpable mass or organomegaly. Abdomen is soft and bowel sounds are normal.    A/P: Abdominal pain-UA,CMP,CBC pending. Patient needs further imaging and evaluation of his abdomen possibly by surgery, radiology recommende CT with contrast, depending on his kidney function which the CMP should assess. To expedite this I spoke with the triage nurse in the [facility] ER and patient was sent directly  there. Patient will followup here in [facility] PRN.            -----------------------------------------------  FOB BASIC SERVICES  Patient checked in: YES  Vitals: YES  Medications reviewed: YES     FU visit and/or single test scheduled: NO  Room Use in Excess of 25 Minutes: no  Room Use Additional 25 Minutes : no  Chaperoning (other than procedures): no    Total number of YES responses: 3    Completed by: Malachy Chamber  ------------------------------

## 2005-07-22 ENCOUNTER — Ambulatory Visit: Payer: PPO | Admitting: Family Medicine

## 2005-07-22 VITALS — BP 106/60 | HR 59 | Temp 97.9°F | Wt 225.0 lb

## 2005-07-22 DIAGNOSIS — N2589 Other disorders resulting from impaired renal tubular function: Secondary | ICD-10-CM

## 2005-07-22 HISTORY — DX: Other disorders resulting from impaired renal tubular function: N25.89

## 2005-07-22 NOTE — Progress Notes (Signed)
-----------------------------------------------    FOB BASIC SERVICES  Patient checked in: YES  Vitals: YES  Medications reviewed: YES     FU visit and/or single test scheduled: NO  Room Use in Excess of 25 Minutes: no  Room Use Additional 25 Minutes : no  Chaperoning (other than procedures): no    Total number of YES responses: 3  Completed by: Jolaine Click  {delete this reminder and go to Orders to enter FOB level}  ------------------------------------------    Tracy Conway is a 71 year old male here to discuss the following:    789.03 ABDOMINAL PAIN RLQ Seen in ED after initial assessment in [facility] for RLQ abd pin. Concern for appendicitis  USN reassuring, CT not done because of eleveated creatinine  588.89 IMPAIRED RENAL FUNCT DISORDERS NEC PHs of slight elevatioin of creatnine to 1.6. PT has been taking aleve on a regular basis for aches     OBJECTIVE:  BP 106/60   Pulse 59   Temp (Src) 97.9 F (36.6 C) (Temporal)   Wt 225 lbs (102.059 kg)  General: healthy, alert, no distress  Abdomen: soft, non-tender. BS normal. No masses or organomegaly, positive findings: diastasis recti, obese, tenderness mild RLQ  Creatinein 1.9, bun 35  Urine spec grac 0,30  Skin warm dry well perfused    ASSESSMENT AND PLAN:    789.03 ABDOMINAL PAIN RLQ  Note:fu Ed and clinic assessment for possible surgicl abdomen  Plan: fu prn as he continues to improve, no blood tests recommended    588.89 IMPAIRED RENAL FUNCT DISORDERS NEC  Note: creatinine elevated in 11/2005 and now worst but pt on aleve  And pt dehydrated  Plan: advised no NSAIDS then recheck creatinine, also rehydrate  Advised fu with Dr. Burgess Estelle and recheck creatine in 4-8 weeks  Careful discussin with pt of the importance of fu      Illa Level, MD

## 2005-09-29 ENCOUNTER — Telehealth: Payer: Self-pay | Admitting: Family Medicine

## 2005-09-29 NOTE — Telephone Encounter (Signed)
Tracy Conway called to schedule his annual physical. Because there are no available slots for a physical on his PCP's schedule (Dr. Burgess Estelle), he has chosen to schedule with Dr. Waylan Boga who is a treating member of the same medical team. He is requesting lab tests to be ordered before his appointment, which is scheduled for Friday, 6.15. He would like a CBC and PSA, the latter to which is related to his upcoming urology appointment.

## 2005-10-07 ENCOUNTER — Other Ambulatory Visit: Payer: Self-pay | Admitting: Family Medicine

## 2005-10-07 LAB — BASIC METABOLIC PANEL
Anion Gap: 4 — ABNORMAL LOW (ref 5–15)
Calcium: 10.4 mg/dL — ABNORMAL HIGH (ref 8.9–10.2)
Carbon Dioxide, Total: 27 mEq/L (ref 22–32)
Chloride: 108 mEq/L (ref 98–108)
Creatinine: 1.7 mg/dL — ABNORMAL HIGH (ref 0.3–1.2)
Glucose: 107 mg/dL (ref 62–125)
Potassium: 3.9 mEq/L (ref 3.7–5.2)
Sodium: 139 mEq/L (ref 136–145)
Urea Nitrogen: 37 mg/dL — ABNORMAL HIGH (ref 8–21)

## 2005-10-09 ENCOUNTER — Ambulatory Visit: Payer: PPO | Admitting: Sports Medicine

## 2005-10-09 VITALS — BP 108/62 | HR 56 | Wt 222.0 lb

## 2005-10-09 LAB — CBC, DIFF
Absolute Eosinophil Count: 0.27 10*3/uL (ref 0–0.50)
Absolute Lymphocyte Count: 1.57 10*3/uL (ref 1.00–4.80)
Basophils: 0.06 10*3/uL (ref 0–0.20)
Hematocrit: 39 % (ref 38–50)
Hemoglobin: 13.2 g/dL (ref 13.0–18.0)
MCH: 29.3 pg (ref 27.3–33.6)
MCHC: 33.6 g/dL (ref 32.3–35.7)
MCV: 87 fL (ref 81–98)
Monocytes: 0.65 10*3/uL (ref 0–0.80)
Neutrophils: 2.71 10*3/uL (ref 1.80–7.00)
Platelet Count: 211 10*3/uL (ref 150–400)
RBC: 4.52 mil/uL (ref 4.40–5.60)
WBC: 5.25 10*3/uL (ref 4.3–10.0)

## 2005-10-09 LAB — PSA, FREE
Prostate Specific Antigen (Free) %: 25 %
Prostate Specific Antigen (Free): 0.94 ng/mL

## 2005-10-09 LAB — LIPID PANEL
Cholesterol (LDL): 77 mg/dL (ref <130)
Cholesterol/HDL Ratio: 3.4
HDL Cholesterol: 36 mg/dL — ABNORMAL LOW (ref >40)
Total Cholesterol: 123 mg/dL (ref <200)
Triglyceride: 48 mg/dL (ref <150)

## 2005-10-09 LAB — PSA, TOTAL & REFLEXIVE FREE: PSA, Diagnostic/Monitoring: 3.82 ng/mL (ref 0.00–4.00)

## 2005-10-09 MED ORDER — ATENOLOL 50 MG OR TABS
ORAL_TABLET | ORAL | Status: DC
Start: 2005-10-09 — End: 2006-03-02

## 2005-10-09 MED ORDER — LOTENSIN 20 MG OR TABS
ORAL_TABLET | ORAL | Status: DC
Start: 2005-10-09 — End: 2006-01-12

## 2005-10-09 MED ORDER — ASPIRIN 81 MG OR TABS
ORAL_TABLET | ORAL | Status: DC
Start: 2005-10-09 — End: 2010-12-30

## 2005-10-09 MED ORDER — HYDROCHLOROTHIAZIDE 12.5 MG OR CAPS
ORAL_CAPSULE | ORAL | Status: DC
Start: 2005-10-09 — End: 2006-03-02

## 2005-10-09 MED ORDER — LIPITOR 40 MG OR TABS
ORAL_TABLET | ORAL | Status: DC
Start: 2005-10-09 — End: 2006-03-02

## 2005-10-09 MED ORDER — NIFEDIPINE 90 MG OR TBCR
EXTENDED_RELEASE_TABLET | ORAL | Status: DC
Start: 2005-10-09 — End: 2006-03-03

## 2005-10-09 NOTE — Progress Notes (Signed)
-----------------------------------------------    FOB BASIC SERVICES  Patient checked in:  YES  Vitals:  YES  Medications reviewed:  YES     FU visit and/or single test scheduled:  NO  Room Use in Excess of 25 Minutes:  no  Room Use Additional 25 Minutes : no  Chaperoning (other than procedures): no    Total number of YES responses: 3  Completed by: Sanela Samardzic    ------------------------------------------

## 2005-10-09 NOTE — Progress Notes (Signed)
S: 71yo male pt of Dr. Burgess Estelle presenting for CPE.  Note: pt had CPE done 12/17/04.  Current c/o are:    1. Requesting labs - PSA, CBC.  He has his Urology appt scheduled for Monday for f/u elevated PSA.    2. Refill Meds -  Per pt, he initially lowered his Benazepril to 20mg  QD however after 2 months noted that his BP remained elevated.  Per pt, states goal BP is <120 as per Dr. Brunilda Payor (CT surgery) with respect to his aortic aneurysm.  He therefore increased back to 40mg  QD and has been stable on this.      All: nkda  Meds:  1. ASA 81mg  QD  2. Lipitor 40mg  QD  3. HCTZ 12.5mg  QD  4. Atenolol 50mg  QD  5. Nifedipine 90mg  ER QD  6. Benazepril 20mg  2 tabs QD  7. Omega-3 1200mg  QD    O: Gen - wnwd aaox3 in NAD  Resp - lungs CTA bilat, no w/c/r  CV - RRR, no s3/s4/m    Labs:  (10/07/05) BMP - wnl including K 3.9.  Except: Cr 1.7 BUN 37 Ca 10.4  (07/21/05) BUN 31 Cr 1.9    A/P:   1. Medication refill  Meds refilled as above.  Stable CRI.  Pt encouraged to f/u with Dr. Burgess Estelle for any other medication adjustment.    2. Labs  CBC, PSA, FLP ordered per pt request    3. CPE  D/w pt that he is not yet due for CPE.  He will schedule this with Dr. Burgess Estelle in 9/07.  Rtc prn    Precepted with Dr. Elita Boone    [place] Chun, MD

## 2005-10-12 ENCOUNTER — Encounter: Payer: PPO | Admitting: Registered Nurse

## 2005-10-15 NOTE — Progress Notes (Addendum)
The patient's history and physical findings were discussed with Dr. Chun on the day of service.  I agree with his assessment and plan.    Jonathan Drezner, M.D.

## 2005-10-16 NOTE — Telephone Encounter (Signed)
Pt already seen in clinic.  ct

## 2005-10-20 ENCOUNTER — Ambulatory Visit: Payer: PPO | Admitting: Family Practice

## 2005-10-20 ENCOUNTER — Encounter: Payer: Self-pay | Admitting: Family Practice

## 2005-10-20 VITALS — BP 110/60 | HR 74 | Wt 223.0 lb

## 2005-10-20 NOTE — Progress Notes (Signed)
S: 71 yo male with swollen lright elbow for 1 month. 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 This resulted in a swollen tender elbow. The tenderness subsided after 2 weeks, but the swelling persist. Non tender for the most part. No prior hx of this. No changes in his range of motion. He has a hx of aortic aneuryism that is being followed by surgery. HE has an elevated creatinine of 1.7 and has been told to avoid NSAIDS.  He reports a history of arthritis, but denies hx of gout. He denies fever, chills or sweats. Symptoms are improving gradually.  O: Male in NAD. BP 110/60  Pulse 74  Wt 223 lbs (101.152 kg)  .Upper extremities:  Normal alignment and symmetry.  Normal shoulder, elbow, wrist hand ROM without pain.  All joints appear stable without sign of subluxation.  Muscle strength and tone is normal.  The right elbow has swollen mildly tender mass present at the olecranon. Mobile mass that does not inhibit range of motion.    A/P: Olecranon bursittiis-Given his elevated creatinine, avoid NSAIDS. I recommeded tylenol as needed for pain which is mild currently. I suggest he give this more time as this may resolve on its own. If it continues, we could drain it with an 18 guge needle. He will folowup PRN.

## 2005-10-20 NOTE — Progress Notes (Signed)
-----------------------------------------------    FOB BASIC SERVICES  Patient checked in:  YES  Vitals:  YES  Medications reviewed:  YES     FU visit and/or single test scheduled:  NO  Room Use in Excess of 25 Minutes:  no  Room Use Additional 25 Minutes : no  Chaperoning (other than procedures): no    Total number of YES responses: 3    Completed by: Cheryl L Millett    ------------------------------------------

## 2005-12-02 ENCOUNTER — Ambulatory Visit: Payer: PPO | Admitting: Family Medicine

## 2005-12-02 VITALS — BP 106/64 | HR 58 | Wt 224.0 lb

## 2005-12-02 LAB — PR X-RAY SHOULDER 2+ VW

## 2005-12-02 LAB — PR X-RAY KNEE 1 OR 2 VIEW

## 2005-12-02 NOTE — Progress Notes (Signed)
Tracy Conway is a 71 year old male here for an annual health maintenance exam. Other concerns today include the following:     1) R Shoulder pain   3 wk hx of pain in the R shoulder.  Occurs periodically, worst in the morning after having slept on it.  Does not note limitation in range of motion or any particular activity/trauma that precipitates it.  No swelling, redness.    2) Stiff painful knees   Long hx of pain in both knees, L knee more painful.  Stiff in the morning.  Was dx w/ arthiritis years ago.  No weakness, swelling, redness, numbness, tingling or pins and needles in the legs.      Patient Active Problem List   Diagnoses Date Noted   . IMPAIRED RENAL FUNCT DISORDERS NEC [588.89] 07/22/2005     Priority: High   . HYPERTENSION NOS(aka HTN) [401.9] 03/29/2002     Priority: High   . PURE HYPERCHOLESTEROLEM [272.0] 03/29/2002     Priority: High   . MALIG NEOPLASM DORSAL TONGUE(aka CANCER) [141.1] 03/29/2002     Priority: High     Surgery ten years ago             Past Medical History   Diagnosis Date   . AORTIC ANEURYSM NOS      Aortic Aneurysm         History   Social History   . Marital Status: Married     Spouse Name: N/A     Number of Children: N/A   . Years of Education: N/A   Occupational History   . Not on file.   Social History Main Topics   . Tobacco Use: Quit      quit three years ago   . Alcohol Use: No      quit 17 years ago   . Drug Use: Not on file   . Sexually Active: Not on file   Other Topics Concern   . Not on file   Social History Narrative   . No narrative on file           REVIEW OF SYSTEMS:    CONSTITUTIONAL: Denies, fatigue, daytime somnolence, unexpected weight loss, unexpected weight gain, fever, chills, night sweats    NEUROLOGIC: Denies, headaches, syncope, focal weakness, signficant memory problems, sensory deficits, paresthesias, aphasia, tremors and involuntary movements    OPHTHALMIC: Denies, change in vision, discharge, redness, blurry vision, diplopia, eye pain    ENT:  Denies, ear pain, epistaxis, nasal congestion, throat pain, allergic rhinitis symptoms and any ear nose or throat problems    RESPIRATORY: Denies, dyspnea at rest, hemoptysis, nighttime apnea, any respiratory problems    CARDIOVASCULAR: Denies, chest pain or pressure at rest or during exercise, palpitations, orthopnea, paroxysmal nocturnal dyspnea, claudication, any cardiovascular problems    GI: Denies, change in appetite, dysphagia, odynophagia, nausea, vomiting, dyspepsia, abdominal pain, diarrhea, constipation, hematochezia, melena    GENITOURINARY: Denies, urethral discharge, erectile dysfunction, dysuria, any genito-urinary problems    INTEGUMENTARY: Denies, skin rash, easy bruising, hair loss    RHEUMATOLOGIC/MSK: Both Knee pain, swelling, stiffness in the morning (left knee more sever than the right).    ENDOCRINE: Denies, polydipsia, heat intolerance, cold intolerance, prior blood sugar problems, prior thyroid problems    PSYCHOSOCIAL: Denies, depressed mood, anhedonia, sleep disturbance, anxiety and any psychological problems    HIV RISK: Denies, history of a sexually transmitted disease, transfusion between 1978 and 1985 and use of illicit drugs  by injection      PHYSICAL EXAM:      General: healthy, alert, no distress, relaxed, cooperative, smiling  Evident difficulty hearing: NO  Skin: Skin color, texture, turgor normal. No rashes or concerning lesions.  Head: Normocephalic. No masses, lesions, tenderness or abnormalities  Eyes: Lids/periorbital skin normal, Conjunctivae/corneas clear, PERRL, EOM's intact  Ears: External ears normal. Canals clear. TM's normal.  Nose:normal  Oropharynx: Lips, mucosa, and tongue normal. Teeth and gums normal., posterior pharynx without erythema or drainage  Neck: Neck supple. No adenopathy. Thyroid symmetric, normal size, without nodules  Lungs: clear to auscultation  Heart: normal rate, regular rhythm and no murmurs, clicks, or gallops  Abd: soft, non-tender. BS normal.  No masses or organomegaly  GU: deferred  Rectal: deferred  Prostate: Deferred  Ext: Normal, without deformities, edema, or skin discoloration, radial and DP pulses 2+ bilaterally  Neuro:  Grossly normal to observation, gait normal  Knees are normal on inspection, no erythema, effusion.  No [employer] damage bilaterally.  Right acromion more pronounced than L.  Limitted range of motion     ASSESSMENT AND PLAN:  [name] is a 71 yo man in good health.    1.  R shoulder arthiralgia: Hx, Sx and PE suggest tendonitis or rotator cuff damage   - Xray to r/o bone/joint dz    2.  Knee pain: Hx, Sx, PE consistent w/ OA   -Xray to monitor joint integrity or bone spurs

## 2005-12-02 NOTE — Progress Notes (Signed)
Pt here for yearly exam.  Please see Karie Mainland Ravanpay's note for details of visit.    Generally doing well, though has some pain in both knees L > R.  Also some right shoulder pain, especially after he sleeps on that side at night.  Pt notices decreased urinary stream--was seen by urology for minimally elevated psa, and urologist suggested flomax, though pt is reluctant to start because of his chronic mild renal failure.    Labs on epic and mindscape reivewed.    PMH: as in Epic and reviewed in in [facility] paper chart.  Meds: see epic list.    Exam:  Pt alert and in NAD.  BP 106/64  Pulse 58  Wt 224 lbs (101.606 kg)  Chest: clear to bases  CV: distant heart sounds, slightly loudish S2, no obvious murmurs.  Abdo: obese, soft NT, ND, no obvious organomeg.  Extrem:  Decreased rom both knees esp in flexion, but no other abnormalities detected  Shoudler: right:  Decreased internal rotation and adduction  Pulses: decreased at post tib, and dorsalis pedis bilat.    A/P:  1) generally healthy 71 yo M despite his lengthy past history.  His labs are up to date.  2) will get xrays of his knees and shoulder.  3) will check with our pharmacist re his meds and his kidney function.  Might consider starting tamsulosin if deemed ok.    Sherlean Foot, MD

## 2005-12-02 NOTE — Progress Notes (Signed)
-----------------------------------------------    FOB BASIC SERVICES  Patient checked in:  YES  Vitals:  YES  Medications reviewed:  YES     FU visit and/or single test scheduled:  NO  Room Use in Excess of 25 Minutes:  no  Room Use Additional 25 Minutes : no  Chaperoning (other than procedures): no    Total number of YES responses: 3  Completed by: Sanela Samardzic    ------------------------------------------

## 2005-12-08 ENCOUNTER — Encounter: Payer: PPO | Admitting: Cardiac Surgery

## 2005-12-08 ENCOUNTER — Other Ambulatory Visit: Payer: PPO

## 2005-12-24 ENCOUNTER — Telehealth: Payer: Self-pay | Admitting: Family Medicine

## 2005-12-24 ENCOUNTER — Encounter: Payer: Self-pay | Admitting: Family Medicine

## 2005-12-24 DIAGNOSIS — I712 Thoracic aortic aneurysm, without rupture, unspecified: Secondary | ICD-10-CM

## 2005-12-24 DIAGNOSIS — J449 Chronic obstructive pulmonary disease, unspecified: Secondary | ICD-10-CM

## 2005-12-24 DIAGNOSIS — N200 Calculus of kidney: Secondary | ICD-10-CM

## 2005-12-24 DIAGNOSIS — L259 Unspecified contact dermatitis, unspecified cause: Secondary | ICD-10-CM

## 2005-12-24 DIAGNOSIS — N400 Enlarged prostate without lower urinary tract symptoms: Secondary | ICD-10-CM

## 2005-12-24 DIAGNOSIS — D126 Benign neoplasm of colon, unspecified: Secondary | ICD-10-CM

## 2005-12-24 DIAGNOSIS — N529 Male erectile dysfunction, unspecified: Secondary | ICD-10-CM

## 2005-12-24 DIAGNOSIS — H9190 Unspecified hearing loss, unspecified ear: Secondary | ICD-10-CM

## 2005-12-24 DIAGNOSIS — Q210 Ventricular septal defect: Secondary | ICD-10-CM

## 2005-12-24 DIAGNOSIS — I714 Abdominal aortic aneurysm, without rupture, unspecified: Secondary | ICD-10-CM

## 2005-12-24 DIAGNOSIS — F17200 Nicotine dependence, unspecified, uncomplicated: Secondary | ICD-10-CM

## 2005-12-24 HISTORY — DX: Unspecified hearing loss, unspecified ear: H91.90

## 2005-12-24 HISTORY — DX: Unspecified contact dermatitis, unspecified cause: L25.9

## 2005-12-24 HISTORY — DX: Chronic obstructive pulmonary disease, unspecified: J44.9

## 2005-12-24 HISTORY — DX: Ventricular septal defect: Q21.0

## 2005-12-24 HISTORY — DX: Calculus of kidney: N20.0

## 2005-12-24 HISTORY — DX: Thoracic aortic aneurysm, without rupture, unspecified: I71.20

## 2005-12-24 HISTORY — DX: Nicotine dependence, unspecified, uncomplicated: F17.200

## 2005-12-24 HISTORY — DX: Benign prostatic hyperplasia without lower urinary tract symptoms: N40.0

## 2005-12-24 HISTORY — DX: Male erectile dysfunction, unspecified: N52.9

## 2005-12-24 HISTORY — DX: Benign neoplasm of colon, unspecified: D12.6

## 2005-12-24 HISTORY — DX: Abdominal aortic aneurysm, without rupture, unspecified: I71.40

## 2005-12-24 MED ORDER — FLOMAX 0.4 MG OR CP24
EXTENDED_RELEASE_CAPSULE | ORAL | Status: DC
Start: 2005-12-24 — End: 2006-03-02

## 2005-12-24 NOTE — Telephone Encounter (Signed)
Spoke to Pt at work on 12/22/05.  Discussed with him that i had spoken to the pharmacist and that she believed that all his current medications should be OK with his degree of chronic renal failure.  The Atenolol might at some point need to be switched to Metoprolol--especially in the case that he develops hypotension that might be due to accumulation of active metabolites of atenolol.  However, we do not seem to be at that point currently.  The Flomax should be fine.  Will send him a presscription for that med.  Pt also mentioned that Dr. Brunilda Payor thought he might benefit from a cardiac stress test because of "plaques" seen on his latest chest CT--will get a stress echo. His last strress echo wa in 2003.

## 2006-01-12 ENCOUNTER — Telehealth: Payer: Self-pay | Admitting: Family Medicine

## 2006-01-12 MED ORDER — LOTENSIN 20 MG OR TABS
ORAL_TABLET | ORAL | Status: DC
Start: 2006-01-12 — End: 2006-03-02

## 2006-01-12 NOTE — Telephone Encounter (Signed)
Received email note from pt that his insurance is changing and he needs to resubmit his prescriptions.  Has them all from Dr. Waylan Boga, except Lotensin 20 mg tabs 2 tabs po daily.  Will write that out and mail to pt.  Sherlean Foot, MD

## 2006-01-21 ENCOUNTER — Other Ambulatory Visit: Payer: PPO

## 2006-02-12 ENCOUNTER — Telehealth: Payer: Self-pay | Admitting: Family Medicine

## 2006-02-12 NOTE — Telephone Encounter (Signed)
Received email from pt stating that he is still having considerable shoulder pain.  I wonder if he would benefit from steroid injection.  Will ask our sports med specialists to see him.  He is awa til the 24th October.  Sherlean Foot, MD

## 2006-02-18 ENCOUNTER — Ambulatory Visit: Payer: PRIVATE HEALTH INSURANCE | Admitting: Family Medicine

## 2006-02-18 VITALS — BP 107/61 | HR 59 | Wt 223.0 lb

## 2006-02-18 NOTE — Progress Notes (Addendum)
Cc:  R shoulder    Hx:  Tracy Conway is a pleasant 71 year old M sent to me by Dr. Burgess Estelle for evaluation of R shoulder pain.  He c/o R shoulder problems for the last 1.5 to 2 months.  Pain is superior and anterior and also in his axilla.  His only new activity was beginning a light workout and weightlifting program in September.  Prior to that he had some "normal achiness" in his shoulder.  Last week he reports having more excruciating shoulder pain and spoke to Dr. Burgess Estelle who made the referral. He has been on vacation since (in [place]) and has not worked out and his shoulder is feeling better today.  When his shoulder did hurt more, ibuprofen was helpful.    ROS:  No neck pain.  No numbness or tingling.    Past Medical History   Diagnosis Date   . AORTIC ANEURYSM NOS      Aortic Aneurysm   . MALIG NEOPLASM DORSAL TONGUE(aka CANCER) 03/29/2002     Ssurgery 1994, T1N1 squamous cell ca L tongue, partial glossectomy; L supraorbital hyoid neck dissection    . TOBACCO USE DISORDER 12/24/2005     Quit 2000.   Marland Kitchen HEARING LOSS NOS 12/24/2005     Bilat hearing aids.   . ECZEMA  12/24/2005     Lower extremities in particular.   Marland Kitchen ERECTILE DYSFUNCTION 12/24/2005   . BENIGN NEOPLASM LG BOWEL 12/24/2005     Tubular adenoma. Dx'ed flex sig 2000--> colonoscopy 2000: N.  F/U 2003: mild diverticulosis, tubular adenoma.  F/U due 2008 with CT colonography.   . IMPAIRED RENAL FUNCT DISORDERS NEC 07/22/2005     Creat ~ 1.2   . CALCULUS OF KIDNEY 12/24/2005     Lithotripsy 1988   . 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 THORACIC AORTIC ANEURYSM 12/24/2005     Ascending aortic aneurysm.  Followed by Dr. Brunilda Payor, Cardiac surgery.  Gets regular CT scans.  Goal systolic BP: 100-120   . ABDOM AORTIC ANEURYSM 12/24/2005     Borderline, seen on CT 8/06.   Marland Kitchen VENTRICULAR SEPT DEFECT 12/24/2005     Small muscular VSD--NEEDS SBE PROPHYLAXIS. (Clindamycin 600 mg prior to dental visits.)   . HYPERTROPHY PROSTATE W/O OBST 12/24/2005     Current outpatient  prescriptions   Medication Sig   . LOTENSIN 20 MG OR TABS Take 2 tablets by mouth every day   . FLOMAX 0.4 MG OR CP24 1 cap po qd, 1/2 hour after same meal of each day   . ATENOLOL 50 MG OR TABS 1 TABLET DAILY   . LIPITOR 40 MG OR TABS 1 TABLET DAILY   . HYDROCHLOROTHIAZIDE 12.5 MG OR CAPS 1 TABLET DAILY   . NIFEDIPINE 90 MG OR TBCR Take 1 tablet by mouth every day   . ASPIRIN 81 MG OR TABS 1 TABLET DAILY   . CLINDAMYCIN HCL 300 MG OR CAPS 2 caps prn prior to dental surgery     Exam:  Blood pressure 107/61, pulse 59, weight 223 lbs (101.152 kg).  Gen- WD, NAD  Neck- good ROM without shoulder pain  Neck- FROM in flexion, extension, side flexion and rotation without exacerbation of shoulder pain  R shoulder- no muscle atrophy, limited active and passive ROM in flexion, abduction, external rotation, and internal rotation compared to L shoulder, R shoulder flexion to 110 passive, 20 external rotation passive, L3 internal rotation active, normal deltoid and  RTC strength, 5/5 abduction, 5/5 external rotation with elbow at side, 5/5 Jobe's position without pain, neg Neer and Hawking impingement tests,  enlarged AC jt but nontender, anterior subacromial region, biceps tendon, and GH jt nontender    Xrays:  I reviewed xrays of his R shoulder taken in August 2007 and my interpretation is as follows: moderated AC jt arthritis with superior spurs, mild GH jt arthritis with subchondral sclerosis of glenoid but preserved jt space    A/P:  1) R shoulder mild GH jt arthritis  - ROM limitations are greater than expected given xray appearance, suggesting some soft tissue restrictions and fibrosis.  Expect would respond well to physical therapy.  - referred to PT for gentle ROM exercises  - recent new wtlifting activities may be culprit that exacerbated shoulder.  Discussed in great detail is wtlifting program and suggested modifications to reduce stress on the shoulder.  Also emphasized importance of low resistance and no straining  (ie must be able to breath through all repetitions) given h/o aortic aneurysm.  Currently BP well controlled.    2) R shoulder moderate AC jt arthritis  - may also contribute a little to pain, but not restriction in ROM.    3) aortic aneurysm  - wt lifting modifications as above    F/u prn.  If pain returns, would recommend cortisone injection (probably both subacromial and GH jt).    Christiane Ha Drezner,MD

## 2006-02-18 NOTE — Progress Notes (Signed)
-----------------------------------------------    FOB BASIC SERVICES  Patient checked in:  YES  Vitals:  YES  Medications reviewed:  YES     FU visit and/or single test scheduled:  NO  Room Use in Excess of 25 Minutes:  no  Room Use Additional 25 Minutes : no  Chaperoning (other than procedures): no    Total number of YES responses: 3  Completed by: Kevin H Arnett    ------------------------------------------    Pre-immunization screening questions were asked and answered.  VIS given to patient.  Patient has no allergies to vaccine or components.  Patient or their caregiver gave verbal consent for immunization.      I administered the injection.  Kevin H Arnett, Medical Assistant

## 2006-03-02 ENCOUNTER — Telehealth: Payer: Self-pay | Admitting: Family Medicine

## 2006-03-02 NOTE — Telephone Encounter (Signed)
PLease call [employer] at [phone] to confirm validity of written Rx signed last June by Dr Waylan Boga and 1 by Dr Burgess Estelle.

## 2006-03-03 MED ORDER — LIPITOR 40 MG OR TABS
ORAL_TABLET | ORAL | Status: DC
Start: 2006-03-02 — End: 2007-01-03

## 2006-03-03 MED ORDER — ATENOLOL 50 MG OR TABS
ORAL_TABLET | ORAL | Status: DC
Start: 2006-03-02 — End: 2007-01-03

## 2006-03-03 MED ORDER — HYDROCHLOROTHIAZIDE 12.5 MG OR CAPS
ORAL_CAPSULE | ORAL | Status: DC
Start: 2006-03-02 — End: 2007-01-03

## 2006-03-03 MED ORDER — LOTENSIN 20 MG OR TABS
ORAL_TABLET | ORAL | Status: DC
Start: 2006-03-02 — End: 2006-09-24

## 2006-03-03 MED ORDER — FLOMAX 0.4 MG OR CP24
EXTENDED_RELEASE_CAPSULE | ORAL | Status: DC
Start: 2006-03-02 — End: 2007-01-03

## 2006-03-03 MED ORDER — PROCARDIA XL 90 MG OR TBCR
EXTENDED_RELEASE_TABLET | ORAL | Status: DC
Start: 2006-03-03 — End: 2007-01-03

## 2006-03-03 NOTE — Telephone Encounter (Signed)
I called the number and the question was whether he was getting long acting generic adalat or procardia--the generic forms are not quite the same.  I decided to place him on Procardia XL 90 mg qd--generic form.  Sherlean Foot, MD

## 2006-05-17 ENCOUNTER — Telehealth: Payer: Self-pay | Admitting: Family Medicine

## 2006-05-17 NOTE — Telephone Encounter (Signed)
Received email from pt who states that his knee pain is progressing to the point that it is very painful to walk and feels as if it is going to give out.  Will see if we can have him seen by Orthopedics soon.  I wonder if he might require surgery.

## 2006-06-11 ENCOUNTER — Encounter: Payer: PRIVATE HEALTH INSURANCE | Admitting: Orthopaedic Surgery

## 2006-07-23 ENCOUNTER — Encounter (HOSPITAL_BASED_OUTPATIENT_CLINIC_OR_DEPARTMENT_OTHER): Payer: PRIVATE HEALTH INSURANCE | Admitting: Orthopaedic Surgery

## 2006-09-18 ENCOUNTER — Telehealth (HOSPITAL_BASED_OUTPATIENT_CLINIC_OR_DEPARTMENT_OTHER): Payer: Self-pay | Admitting: Family Medicine

## 2006-09-18 ENCOUNTER — Ambulatory Visit (EMERGENCY_DEPARTMENT_HOSPITAL): Payer: PRIVATE HEALTH INSURANCE

## 2006-09-18 NOTE — Telephone Encounter (Signed)
SUBJECTIVE:  Tracy Conway is a 72 year old male who calls in to after-hours line for a one-day h/o RLQ cramping, pain, and bloated feeling. He states he has had this before put usually passes in a few hours. This started last night. He denies N/V. Last BM was yesterday and was normal. Today, he has only had water. His temp currently is 101.5 and BP is 116/64 as measured at home. He said he was able to palpate deeply around his RLQ to see if it was his appendix and he didn't think so.    OBJECTIVE:  This may represent a diverticulitis vs appendicitis. Given the history I urged him to seek evaluation and treatment at the ER. He agreed to this plan and will have his wife drive him down presently.

## 2006-09-19 ENCOUNTER — Encounter (HOSPITAL_COMMUNITY): Payer: PRIVATE HEALTH INSURANCE | Admitting: Family Medicine

## 2006-09-23 ENCOUNTER — Encounter (HOSPITAL_BASED_OUTPATIENT_CLINIC_OR_DEPARTMENT_OTHER): Payer: PRIVATE HEALTH INSURANCE | Admitting: Orthopaedic Surgery

## 2006-09-24 ENCOUNTER — Ambulatory Visit (HOSPITAL_BASED_OUTPATIENT_CLINIC_OR_DEPARTMENT_OTHER): Payer: PRIVATE HEALTH INSURANCE | Admitting: Family Medicine

## 2006-09-24 VITALS — BP 126/80 | HR 76 | Wt 226.0 lb

## 2006-09-24 LAB — URINALYSIS COMPLETE, URN
Bacteria, URN: NONE SEEN
Bilirubin (Qual), URN: NEGATIVE
Epith Cells_Renal/Trans,URN: NEGATIVE /HPF
Epith Cells_Squamous, URN: NEGATIVE /LPF
Glucose Qual, URN: NEGATIVE mg/dL
Ketones, URN: NEGATIVE mg/dL
Leukocyte Esterase, URN: NEGATIVE
Nitrite, URN: NEGATIVE
Occult Blood, URN: NEGATIVE
Protein (Alb Semiquant), URN: NEGATIVE mg/dL
RBC, URN: NEGATIVE /HPF
Specific Gravity, URN: 1.019 g/mL (ref 1.002–1.02)
WBC, URN: NEGATIVE /HPF
pH, URN: 5.5 (ref 5.0–8.0)

## 2006-09-24 LAB — BASIC METABOLIC PANEL
Anion Gap: 2 — ABNORMAL LOW (ref 5–15)
Calcium: 10.3 mg/dL — ABNORMAL HIGH (ref 8.9–10.2)
Carbon Dioxide, Total: 28 mEq/L (ref 22–32)
Chloride: 110 mEq/L — ABNORMAL HIGH (ref 98–108)
Creatinine: 1.5 mg/dL — ABNORMAL HIGH (ref 0.3–1.2)
GFR, Calc, African American: 60 mL/min (ref >59)
GFR, Calc, European American: 49 mL/min — ABNORMAL LOW (ref >59)
Glucose: 143 mg/dL — ABNORMAL HIGH (ref 62–125)
Potassium: 4.7 mEq/L (ref 3.7–5.2)
Sodium: 140 mEq/L (ref 136–145)
Urea Nitrogen: 31 mg/dL — ABNORMAL HIGH (ref 8–21)

## 2006-09-24 LAB — PHOSPHATE: Phosphate: 3.7 mg/dL (ref 2.5–4.5)

## 2006-09-24 MED ORDER — LOTENSIN 20 MG OR TABS
ORAL_TABLET | ORAL | Status: DC
Start: 2006-09-24 — End: 2007-01-03

## 2006-09-24 NOTE — Progress Notes (Signed)
-------------------------------------------    Attending: Tyrone Schimke, MD  I discussed this patient's history and physical findings with the resident, as well as the plan for this patient.  I have reviewed and confirm the findings and plan as documented in the resident's note .  Key findings based upon this discussion:  73 year old in f/u for sigmoid diverticulitis.  Cr was somewhat elevated during stay without workup.  His most recent Cr=2.0.    His last u/a was over a year ago, neg for protein.  Repeat CMP and complete u/a with culture.  Referral to nephrology.  -------------------------------------------

## 2006-09-24 NOTE — Progress Notes (Signed)
1. F/U diverticulitis - Admitted earlier this week, got IV abx, with remarkable overnight improvement. D/C'd on Levofloxacin 250 and Flagyl 500 qid x 10-day total course. Says RLQ pain has resolved. Still feels somewhat bloated. Had some lower back pain that resolved.  Still has soft stool, one BM/day. NO n/v. Appetite okay.    2. F/U CRI - has had baseline Cr 1.6-1.9 x 2 yr, was 1.8-2.0 in hospital. UA one yr ago was neg for prot. No dysuria. Has hx nephrolithiasis and lithotripsy, no recent stones.     Patient Active Problem List   Diagnoses Code    HYPERTENSION NOS(aka HTN) 401.9    PURE HYPERCHOLESTEROLEM 272.0    MALIG NEOPLASM DORSAL TONGUE(aka CANCER) 141.1    IMPAIRED RENAL FUNCT DISORDERS NEC 588.89    TOBACCO USE DISORDER 305.1    HEARING LOSS NOS 389.9    ECZEMA  692.9    ERECTILE DYSFUNCTION 607.84    BENIGN NEOPLASM LG BOWEL 211.3    CALCULUS OF KIDNEY 592.0    COPD 496    THORACIC AORTIC ANEURYSM 441.2    ABDOM AORTIC ANEURYSM 441.4    VENTRICULAR SEPT DEFECT 745.4    HYPERTROPHY PROSTATE W/O OBST 600.00       BP 126/80   Pulse 76   Wt 226 lbs (102.513 kg)  GEN: aaox3. Nad.  ABD: ND. NT. BS+    A/P: 71yM with:  1. Resolving diverticulitis -- responding well to Levofloxacin and Flagyl. Cont course. ADAT. RTC if pain returns, or cannot tolerate anything orally.  2. Elevated Cr, c/w Stage III CKD  -recheck BMP with PO4  -check UA and UCx. If UTI --> treat. If protein --> get 24h.  -increase Lotensin for ACEI benefit to 60mg  daily  -refer to Renal for assessment and recs.    F/U with Dr. Burgess Estelle in 3 months for med mgmt

## 2006-09-26 LAB — URINE C/S: Colony Count: 400

## 2006-10-11 ENCOUNTER — Telehealth (HOSPITAL_BASED_OUTPATIENT_CLINIC_OR_DEPARTMENT_OTHER): Payer: Self-pay | Admitting: Family Medicine

## 2006-10-11 NOTE — Telephone Encounter (Signed)
[employer] [facility] IS CALLING ON REFERENCE #: [id number]  HAS QUESTIONS ABOUT THE MEDICATON  LOTENSIN 20 MG

## 2006-10-12 NOTE — Telephone Encounter (Signed)
Patient mailed to [facility], needs for 90 days and not 60. [employer] notified.

## 2006-10-25 ENCOUNTER — Telehealth (HOSPITAL_BASED_OUTPATIENT_CLINIC_OR_DEPARTMENT_OTHER): Payer: Self-pay | Admitting: Family Medicine

## 2006-10-25 NOTE — Telephone Encounter (Signed)
Received email from Pt requesting that he be referred for CT colonography--he is due for this for colon cancer screening--this test recommended by GI as it was difficult to reach the end of his colon.  If there is evidence on this test of significant polyp regrowth--he will need repeat colonoscopy too. ct

## 2006-11-02 ENCOUNTER — Encounter (HOSPITAL_BASED_OUTPATIENT_CLINIC_OR_DEPARTMENT_OTHER): Payer: PRIVATE HEALTH INSURANCE | Admitting: Nephrology

## 2006-11-10 ENCOUNTER — Telehealth (HOSPITAL_BASED_OUTPATIENT_CLINIC_OR_DEPARTMENT_OTHER): Payer: Self-pay | Admitting: Family Medicine

## 2006-11-10 NOTE — Telephone Encounter (Signed)
Patient is scheduled for a CT tomorrow, Thurs. July 17th. It has been denied, but they need a provider to contact the MD at [employer] who does the authorizing for [employer] to see if they can have this authorized, otherwise we will need to cancel the CT for tomorrow.  Please let me know what is decided so that I can contact CT.

## 2006-11-10 NOTE — Telephone Encounter (Signed)
Dr. Biagio Quint calls back from [employer] [facility].  Monia Pouch has strict criteria for allowing CT colonography.  Relayed to this physician that Mr. Hollings had an incolomplete colonoscopy in 6/03 2/2 tortuous colon, where it was recommended that he receive a CT colonosgraphy.  Dr. Biagio Quint says w/ this info, the patient meets criteria for this procedure, so he will fax an authorization to our office.  Jenna Luo, MD

## 2006-11-11 ENCOUNTER — Other Ambulatory Visit (HOSPITAL_BASED_OUTPATIENT_CLINIC_OR_DEPARTMENT_OTHER): Payer: PRIVATE HEALTH INSURANCE

## 2006-11-15 ENCOUNTER — Encounter (HOSPITAL_BASED_OUTPATIENT_CLINIC_OR_DEPARTMENT_OTHER): Payer: PRIVATE HEALTH INSURANCE

## 2006-11-19 ENCOUNTER — Telehealth (HOSPITAL_BASED_OUTPATIENT_CLINIC_OR_DEPARTMENT_OTHER): Payer: Self-pay | Admitting: Family Medicine

## 2006-11-19 NOTE — Telephone Encounter (Signed)
Pharmacy is requesting refills on the attached, please advise.  Fax received 11/19/06  Thanks, Courtney Smith.

## 2006-11-22 ENCOUNTER — Telehealth (HOSPITAL_BASED_OUTPATIENT_CLINIC_OR_DEPARTMENT_OTHER): Payer: Self-pay | Admitting: Family Medicine

## 2006-11-22 NOTE — Telephone Encounter (Signed)
Pharmacy is requesting refills on the attached, please advise.  Fax recieved07/27/08  Thanks, Kae Heller.

## 2006-11-29 MED ORDER — NIFEDIPINE 90 MG OR TBCR
EXTENDED_RELEASE_TABLET | ORAL | Status: DC
Start: 2006-11-22 — End: 2007-01-03

## 2006-11-29 NOTE — Telephone Encounter (Signed)
OK to refill.  ct

## 2006-12-17 ENCOUNTER — Encounter (HOSPITAL_BASED_OUTPATIENT_CLINIC_OR_DEPARTMENT_OTHER): Payer: Self-pay | Admitting: Family Medicine

## 2006-12-21 ENCOUNTER — Encounter (HOSPITAL_BASED_OUTPATIENT_CLINIC_OR_DEPARTMENT_OTHER): Payer: PRIVATE HEALTH INSURANCE | Admitting: Nephrology

## 2006-12-25 ENCOUNTER — Encounter (HOSPITAL_BASED_OUTPATIENT_CLINIC_OR_DEPARTMENT_OTHER): Payer: Self-pay | Admitting: Family Medicine

## 2007-01-03 ENCOUNTER — Ambulatory Visit (HOSPITAL_BASED_OUTPATIENT_CLINIC_OR_DEPARTMENT_OTHER): Payer: PRIVATE HEALTH INSURANCE | Admitting: Family Medicine

## 2007-01-03 ENCOUNTER — Other Ambulatory Visit: Payer: Self-pay | Admitting: Nephrology

## 2007-01-03 ENCOUNTER — Encounter (HOSPITAL_BASED_OUTPATIENT_CLINIC_OR_DEPARTMENT_OTHER): Payer: Self-pay | Admitting: Family Medicine

## 2007-01-03 VITALS — BP 102/51 | HR 58 | Temp 98.2°F | Ht 70.25 in | Wt 219.0 lb

## 2007-01-03 MED ORDER — TRIAMCINOLONE ACETONIDE 0.1 % EX OINT
TOPICAL_OINTMENT | CUTANEOUS | Status: DC
Start: 2007-01-03 — End: 2009-12-17

## 2007-01-03 MED ORDER — FLOMAX 0.4 MG OR CP24
EXTENDED_RELEASE_CAPSULE | ORAL | Status: DC
Start: 2007-01-03 — End: 2009-05-24

## 2007-01-03 MED ORDER — LIPITOR 40 MG OR TABS
ORAL_TABLET | ORAL | Status: DC
Start: 2007-01-03 — End: 2008-01-11

## 2007-01-03 MED ORDER — NIFEDIPINE 90 MG OR TBCR
EXTENDED_RELEASE_TABLET | ORAL | Status: DC
Start: 2007-01-03 — End: 2008-03-12

## 2007-01-03 MED ORDER — LOTENSIN 20 MG OR TABS
ORAL_TABLET | ORAL | Status: DC
Start: 2007-01-03 — End: 2007-05-06

## 2007-01-03 MED ORDER — SPECTAZOLE 1 % EX CREA
TOPICAL_CREAM | CUTANEOUS | Status: DC
Start: 2007-01-03 — End: 2009-12-17

## 2007-01-03 MED ORDER — PROCARDIA XL 90 MG OR TBCR
EXTENDED_RELEASE_TABLET | ORAL | Status: DC
Start: 2007-01-03 — End: 2007-01-12

## 2007-01-03 MED ORDER — HYDROCHLOROTHIAZIDE 12.5 MG OR CAPS
ORAL_CAPSULE | ORAL | Status: DC
Start: 2007-01-03 — End: 2008-01-11

## 2007-01-03 MED ORDER — ATENOLOL 50 MG OR TABS
ORAL_TABLET | ORAL | Status: DC
Start: 2007-01-03 — End: 2008-01-06

## 2007-01-03 NOTE — Progress Notes (Signed)
Tracy Conway is a 72 year old male here for an annual health maintenance exam. Other concerns today include the following:     1) Prostate Ca surveillance:  Pt has history of PSA in high-normal range and a family history of prostate cancer.  Has been receiving annual PSA and had a Urology consult which recommended no additional intervention other than following the PSA annually.    2) R arm skin lesion - Thick, crusted skin lesion noted in R distal tricep region.  Has been increasing in size over past year.  Previous treatment with liquid nitrogen did not remove lesion.  Lesion is slightly pruritic, non-painful and does not bleed.  Primary concern for pt is increase in size.    3) Axillary rash - Pt notes 2-3 mo history of pruritic, red rash in bilateral axilla.  Previously used Old Spice deoderant but has discontinued this.  Application of OTC topical Lamisil helps to relieve pruritis but has not completely resolved condition and has not relieved redness.  No prior history of similar condition.  No pain or unusual odor noted        Patient Active Problem List   Diagnoses Date Noted   . TOBACCO USE DISORDER [305.1] 12/24/2005     Priority: High     Quit 2000.  EtOH--quit 1986.   Marland Kitchen HEARING LOSS NOS [389.9] 12/24/2005     Priority: High     Bilat hearing aids.   . ECZEMA  [692.9] 12/24/2005     Priority: High     Lower extremities in particular.   Marland Kitchen ERECTILE DYSFUNCTION [607.84] 12/24/2005     Priority: High   . BENIGN NEOPLASM LG BOWEL [211.3] 12/24/2005     Priority: High     Tubular adenoma. Dx'ed flex sig 2000--> colonoscopy 2000: N.  F/U 2003: mild diverticulosis, tubular adenoma.  F/U due 2008 with CT colonography.--done 7/08: ess. N.  F/U??   . CALCULUS OF KIDNEY [592.0] 12/24/2005     Priority: High     Lithotripsy 1988   . COPD [496] 12/24/2005     Priority: High     emphysema   . THORACIC AORTIC ANEURYSM [441.2] 12/24/2005     Priority: High     Ascending aortic aneurysm.  Followed by Dr. Brunilda Payor,  Cardiac surgery.  Gets regular CT scans.  Goal systolic BP: 100-120   . ABDOM AORTIC ANEURYSM [441.4] 12/24/2005     Priority: High     Borderline, seen on CT 8/06.   Marland Kitchen VENTRICULAR SEPT DEFECT [745.4] 12/24/2005     Priority: High     Small muscular VSD--NEEDS SBE PROPHYLAXIS. (Clindamycin 600 mg prior to dental visits.)   . HYPERTROPHY PROSTATE W/O OBST [600.00] 12/24/2005     Priority: High   . Chronic Kidney Disease -- Stage III [588.89] 07/22/2005     Priority: High     Creat ~ 1.8. Normal albuminuria (8/08)  Likely 2/2 HTN, poss component from hx obstructing nephrolithiasis.  8/08 Renal: referred to Uro for 8mm stone, nonobstructing. Also doing w/u for stone type, poss target prevention.   Marland Kitchen HYPERTENSION NOS(aka HTN) [401.9] 03/29/2002     Priority: High   . PURE HYPERCHOLESTEROLEM [272.0] 03/29/2002     Priority: High   . MALIG NEOPLASM DORSAL TONGUE(aka CANCER) [141.1] 03/29/2002     Priority: High     Ssurgery 1994, T1N1 squamous cell ca L tongue, partial glossectomy; L supraorbital hyoid neck dissection  Past Medical History   Diagnosis Date   . AORTIC ANEURYSM NOS      Aortic Aneurysm   . MALIG NEOPLASM DORSAL TONGUE(aka CANCER) 03/29/2002     Ssurgery 1994, T1N1 squamous cell ca L tongue, partial glossectomy; L supraorbital hyoid neck dissection    . TOBACCO USE DISORDER 12/24/2005     Quit 2000.   Marland Kitchen HEARING LOSS NOS 12/24/2005     Bilat hearing aids.   . ECZEMA  12/24/2005     Lower extremities in particular.   Marland Kitchen ERECTILE DYSFUNCTION 12/24/2005   . BENIGN NEOPLASM LG BOWEL 12/24/2005     Tubular adenoma. Dx'ed flex sig 2000--> colonoscopy 2000: N.  F/U 2003: mild diverticulosis, tubular adenoma.  F/U due 2008 with CT colonography.   . IMPAIRED RENAL FUNCT DISORDERS NEC 07/22/2005     Creat ~ 1.2   . CALCULUS OF KIDNEY 12/24/2005     Lithotripsy 1988   . 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 THORACIC AORTIC ANEURYSM 12/24/2005     Ascending aortic aneurysm.  Followed by Dr. Brunilda Payor, Cardiac surgery.  Gets  regular CT scans.  Goal systolic BP: 100-120   . ABDOM AORTIC ANEURYSM 12/24/2005     Borderline, seen on CT 8/06.   Marland Kitchen VENTRICULAR SEPT DEFECT 12/24/2005     Small muscular VSD--NEEDS SBE PROPHYLAXIS. (Clindamycin 600 mg prior to dental visits.)   . HYPERTROPHY PROSTATE W/O OBST 12/24/2005         Past Surgical History   Procedure Date   . Ventral hernia repair      x3   . Removal of tongue carcinoma with lymphadenectomy    . Fusion l foot distal phalange    . Lithotrypsy of r renal calculi    . Retinaculum release b/l wrists    . Vasectomy w/tray      x2   . Vasectomy reversal          Family History   Problem Relation   . Cancer Father     Prostate   . Arthritis Father   . Other Father     Chronic Kidney Disease   . Psych Mother     Alzheimer's Disease         MEDICATIONS: See list below    History   Social History   . Marital Status: Married     Spouse Name: N/A     Number of Children: 5   . Years of Education: N/A   Occupational History   . 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 Tobacco Use: Quit      quit three years ago   . Alcohol Use: No      quit 17 years ago   . Drug Use: No   . Sexually Active: Yes -- Male partner(s)      no protection   Other Topics Concern   . Not on file   Social History Narrative   . No narrative on file           REVIEW OF SYSTEMS:    CONSTITUTIONAL: Denies, fatigue, daytime somnolence    NEUROLOGIC: Denies, headaches and signficant memory problems    OPHTHALMIC: Denies, change in vision, discharge    ENT: Denies, hearing difficulties, nasal congestion and any ear nose or throat problems    RESPIRATORY: Denies, dyspnea on exertion, dyspnea at rest, cough, wheezing, any respiratory problems    CARDIOVASCULAR:  Denies, chest pain or pressure at rest or during exercise, palpitations, peripheral edema, any cardiovascular problems    GI: Denies, change in appetite, dysphagia, nausea, vomiting, dyspepsia, abdominal pain, diarrhea, constipation, melena    GENITOURINARY:  Denies, dysuria, any genito-urinary problems    INTEGUMENTARY: See above    RHEUMATOLOGIC/MSK: Denies, arthralgias, joint swelling, pain in limbs, back pain, any joint or arthritic problems    ENDOCRINE: Denies, polyuria, prior blood sugar problems    PSYCHOSOCIAL: Denies, depressed mood, sleep disturbance and any psychological problems    HIV RISK: Denies, multiple sexual partners and history of a sexually transmitted disease      PHYSICAL EXAM:  Blood pressure 102/51, pulse 58, temperature 98.2 F (36.8 C), temperature source Temporal, height 5' 10.25" (1.784 m), weight 219 lb (99.338 kg).    General: healthy, alert, no distress  Evident difficulty hearing: NO  Skin: Skin color, texture, turgor normal.   60mmx6mm hyperkeratotic lesion raised above skin, non-painful, non-mobile noted on R posterios upper arm.  Head: Normocephalic. No masses, lesions, tenderness or abnormalities  Eyes: Lids/periorbital skin normal, Conjunctivae/corneas clear, PERRL, EOM's intact  Ears: External ears normal. Canals clear. R TM clear, L TM degenrated with multiple holes and cerumen impaction  Nose:normal, nares patent, no rhinorrhea  Oropharynx: Lips, mucosa, and tongue normal. Teeth and gums normal., posterior pharynx without erythema or drainage  Neck: supple. No adenopathy. Thyroid symmetric, normal size, without nodules  Lungs: clear to auscultation  Heart: normal rate, regular rhythm and no murmurs, clicks, or gallops  Abd: soft, non-tender. BS normal. No masses or organomegaly, palpable mesh located medially at location of past ventral hernia repairs.  GU: Penis: deferred  Rectal: deferred  Prostate: Deferred  Ext: Normal, without deformities, edema, or skin discoloration, radial and DP pulses 2+ bilaterally  Neuro:  Grossly normal to observation, Cranial nerves 2-12 intact, strength intact all four extremities, sensation intact to soft touch all four extremities, patellar and biceps brachii reflexes 2+ bilaterally, gait  normal    ASSESSMENT AND PLAN:    1. Health Maintenance:       Fasting Lipid Panel, Fasting CMP (glucose noted to be 142 in past CMP, post-hospitalization), PSA for  annual screening.  CT Colonoscopy completed recently, results showed no evidence of colonic   disease or neoplasm (other notable findings include abdominal aortic aneurysm (3.0cm), and   atherosclerotic disease of abdomial aorta and common iliac arteries).     2. Aortic Aneurysm - Thoracic and abdominal anerysms have been noted.  Cardiology following progress of aneurysms, continue follow-up as needed.    3.  Sebborheic Keratosis - Examination of lesion on posterior R arm consistent with this diagnosis, unlikely malignant in nature.  Second cryotherapy performed in office today.  Pt instructed on what to expect at site.  Pt instructed to return if lesion persists or worsens and will consider dematology referral at the time.    4. Tinea Cruris - Rash in axilla most likely fungal in nature.  Recommended keeping the site clean and dry.  Prescriptions given for 14 days of Spectazole and Triamcinolone treatment course.  Pt again instructed to return to clinic for presistence or worsening.    5. Medication Refills - Pt in changing insurance carriers and requested paper copies of all prescriptions (see med list).  These were provide during the visit.    6. Follow-up: 1 year unless otherwise indicated by lab results or other pt concerns.    Dezarae Mcclaran, MS-4.

## 2007-01-03 NOTE — Progress Notes (Signed)
-----------------------------------------------    FOB BASIC SERVICES  Patient checked in:  YES  Vitals:  YES  Medications reviewed:  YES     FU visit and/or single test scheduled:  NO  Room Use in Excess of 25 Minutes:  no  Room Use Additional 25 Minutes : no  Chaperoning (other than procedures): no    Total number of YES responses: 3  Completed by: Kevin H Arnett    ------------------------------------------

## 2007-01-06 LAB — STONE FORMER PANEL 1, URN
Calcium Interval: 24 hr
Calcium Total Volume: 1875 mL
Calcium, URN: 13.4 mg/dL
Calcium/24 HR, URN: 251 mg/24 hr (ref 50–300)
Citrate Interval, URN: 24 hr
Citrate Total Volume, URN: 1875 mL
Citrate/24 Hr, URN: 1.9 mmol/24 hr (ref 1.7–6.4)
Citrate/Unit, URN: 1 mmol/L
Creatinine Interval, URN: 24 hr
Creatinine Total Volume, URN: 1875 mL
Creatinine/24H, URN: 1800 mg/24 hr (ref 1000–2000)
Creatinine/Unit, URN: 96 mg/dL
Interval, URN: 24 hr
Oxalate Collection Interval (Sendout): 24 h
Oxalate Conc., URN (Sendout): 0.23 mmol/L
Oxalate Total Volume (Sendout: 1875 mL
Oxalate/24 hr (mmol/24 h), Urine (Sendout): 0.43 mmol/spec (ref 0.11–0.46)
Oxalate/24hr, (mg/spec), Urine (Sendout): 37.8 mg/spec (ref 9.7–40.5)
Oxalate/Unit, URN (Sendout): 20.2 mg/L
Sodium, URN: 135 mEq/L
Sodium/24Hr, URN: 253 mEq/24 hr — ABNORMAL HIGH (ref 40–220)
Total Volume, URN: 1875 mL
Uric Acid Interval, URN: 24 hr
Uric Acid Total Volume, URN: 1875 mL
Uric Acid, URN: 22 mg/dL
Uric Acid/24H, URN: 0.41 g/24 hr (ref 0.30–0.80)

## 2007-01-07 ENCOUNTER — Encounter (HOSPITAL_BASED_OUTPATIENT_CLINIC_OR_DEPARTMENT_OTHER): Payer: Self-pay | Admitting: Family Medicine

## 2007-01-07 DIAGNOSIS — K5732 Diverticulitis of large intestine without perforation or abscess without bleeding: Secondary | ICD-10-CM

## 2007-01-07 DIAGNOSIS — M159 Polyosteoarthritis, unspecified: Secondary | ICD-10-CM

## 2007-01-07 HISTORY — DX: Polyosteoarthritis, unspecified: M15.9

## 2007-01-07 HISTORY — DX: Diverticulitis of large intestine without perforation or abscess without bleeding: K57.32

## 2007-01-07 NOTE — Progress Notes (Signed)
Tracy Conway is a 72 year old male here for an annual health maintenance exam. Pt seen along with Nicole Cella MS IV--please see his note for dettails. Other concerns today include the following:     Yearly exam  Wants psa check.  Was high last year and pt saw urology--no abnormalities detected, other than bph.  It was recommended that he continue with psa checks yearly.  Pt has some decreased urinary stream from bph, better with flomax.  R arm lesion was treated with liquid N2--did not go away.  Has axillary rash that itches a bit.  Used to use old spice deodorant, but has stopped using any products in that area.  Has tried lamisil otc cream with not much help.    Current dietary habits: tries  Current exercise habits: active    Patient Active Problem List   Diagnoses Date Noted   . GENERAL OSTEOARTHROSIS [715.00] 01/07/2007     Priority: High     Has seen orthopedics--cortisone shot to L knee helped.   Marland Kitchen DIVERTICULITIS OF COLON W/O BLEED [562.11] 01/07/2007     Priority: High     Hospitalized at [facility] May 2008   . TOBACCO USE DISORDER [305.1] 12/24/2005     Priority: High     Quit 2000.  EtOH--quit 1986.   Marland Kitchen HEARING LOSS NOS [389.9] 12/24/2005     Priority: High     Bilat hearing aids.   . ECZEMA  [692.9] 12/24/2005     Priority: High     Lower extremities in particular.   Marland Kitchen ERECTILE DYSFUNCTION [607.84] 12/24/2005     Priority: High   . BENIGN NEOPLASM LG BOWEL [211.3] 12/24/2005     Priority: High     Tubular adenoma. Dx'ed flex sig 2000--> colonoscopy 2000: N.  F/U 2003: mild diverticulosis, tubular adenoma.  F/U due 2008 with CT colonography.--done 7/08: ess. N.  F/U??   . CALCULUS OF KIDNEY [592.0] 12/24/2005     Priority: High     Lithotripsy 1988   . COPD [496] 12/24/2005     Priority: High     emphysema   . THORACIC AORTIC ANEURYSM [441.2] 12/24/2005     Priority: High     Ascending aortic aneurysm.  Followed by Dr. Brunilda Payor, Cardiac surgery.  Gets regular CT scans.  Goal systolic BP: 100-120   . ABDOM  AORTIC ANEURYSM [441.4] 12/24/2005     Priority: High     Borderline, seen on CT 8/06.   Marland Kitchen VENTRICULAR SEPT DEFECT [745.4] 12/24/2005     Priority: High     Small muscular VSD--NEEDS SBE PROPHYLAXIS. (Clindamycin 600 mg prior to dental visits.)   . HYPERTROPHY PROSTATE W/O OBST [600.00] 12/24/2005     Priority: High   . Chronic Kidney Disease -- Stage III [588.89] 07/22/2005     Priority: High     Creat ~ 1.8. Normal albuminuria (8/08)  Likely 2/2 HTN, poss component from hx obstructing nephrolithiasis.  8/08 Renal: referred to Uro for 8mm stone, nonobstructing. Also doing w/u for stone type, poss target prevention.   Marland Kitchen HYPERTENSION NOS(aka HTN) [401.9] 03/29/2002     Priority: High   . PURE HYPERCHOLESTEROLEM [272.0] 03/29/2002     Priority: High   . 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 AORTIC ANEURYSM NOS      Aortic Aneurysm   . MALIG NEOPLASM DORSAL TONGUE(aka CANCER) 03/29/2002     Ssurgery 1994, T1N1 squamous cell ca L tongue, partial glossectomy; L supraorbital hyoid neck dissection    . TOBACCO USE DISORDER 12/24/2005     Quit 2000.   Marland Kitchen HEARING LOSS NOS 12/24/2005     Bilat hearing aids.   . ECZEMA  12/24/2005     Lower extremities in particular.   Marland Kitchen ERECTILE DYSFUNCTION 12/24/2005   . BENIGN NEOPLASM LG BOWEL 12/24/2005     Tubular adenoma. Dx'ed flex sig 2000--> colonoscopy 2000: N.  F/U 2003: mild diverticulosis, tubular adenoma.  F/U due 2008 with CT colonography.   . IMPAIRED RENAL FUNCT DISORDERS NEC 07/22/2005     Creat ~ 1.2   . CALCULUS OF KIDNEY 12/24/2005     Lithotripsy 1988   . 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 THORACIC AORTIC ANEURYSM 12/24/2005     Ascending aortic aneurysm.  Followed by Dr. Brunilda Payor, Cardiac surgery.  Gets regular CT scans.  Goal systolic BP: 100-120   . ABDOM AORTIC ANEURYSM 12/24/2005      Borderline, seen on CT 8/06.   Marland Kitchen VENTRICULAR SEPT DEFECT 12/24/2005     Small muscular VSD--NEEDS SBE PROPHYLAXIS. (Clindamycin 600 mg prior to dental visits.)   . HYPERTROPHY PROSTATE W/O OBST 12/24/2005   . GENERAL OSTEOARTHROSIS 01/07/2007     Has seen orthopedics--cortisone shot to L knee helped.   Marland Kitchen DIVERTICULITIS OF COLON W/O BLEED 01/07/2007     Hospitalized at [facility] May 2008         Past Surgical History   Procedure Date   . Ventral hernia repair      x3   . Removal of tongue carcinoma with lymphadenectomy    . Fusion l foot distal phalange    . Lithotrypsy of r renal calculi    . Retinaculum release b/l wrists    . Vasectomy w/tray      x2   . Vasectomy reversal          Family History   Problem Relation   . Cancer Father     Prostate   . Arthritis Father   . Other Father     Chronic Kidney Disease   . Psych Mother     Alzheimer's Disease         MEDICATIONS: See list below    History   Social History   . Marital Status: Married     Spouse Name: N/A     Number of Children: 5   . Years of Education: N/A   Occupational History   . 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 Tobacco Use: Quit      quit three years ago   . Alcohol Use: No      quit 17 years ago   . Drug Use: No   . Sexually Active: Yes -- Male partner(s)      no protection   Other Topics Concern   . Not on file   Social History Narrative   . No narrative on file           REVIEW OF SYSTEMS:    CONSTITUTIONAL: Denies, fatigue, daytime somnolence, unexpected weight loss, unexpected weight gain, fever, chills    NEUROLOGIC: Denies, headaches, syncope, focal weakness, signficant memory problems,  paresthesias and tremors    OPHTHALMIC: Denies, change in vision, discharge, eye pain    ENT: Denies, nasal congestion, any ear nose or throat problems, .  Does have and hearing difficulties wears hearing aid    RESPIRATORY: Denies, dyspnea on exertion, dyspnea at rest, cough, hemoptysis, wheezing, any respiratory problems    CARDIOVASCULAR: Denies,  chest pain or pressure at rest or during exercise, palpitations, orthopnea, paroxysmal nocturnal dyspnea, peripheral edema, any cardiovascular problems    GI: Denies, change in appetite, dysphagia, nausea, vomiting, dyspepsia, abdominal pain, diarrhea, constipation, hematochezia, melena, .  Does have recent hospitalization for diverticulitis.    GENITOURINARY: Denies, urethral discharge, dysuria    INTEGUMENTARY: Denies, easy bruising, hair loss, changes in moles or new moles, .  Does have, other skin changes: see HPI    RHEUMATOLOGIC/MSK: Denies, joint swelling, joint stiffness, limited range of motion    ENDOCRINE: Denies, polyuria, polydipsia, heat intolerance, cold intolerance, prior blood sugar problems, prior thyroid problems    PSYCHOSOCIAL: Denies, depressed mood, anhedonia, anxiety and any psychological problems      PHYSICAL EXAM:      General: healthy, alert, no distress, relaxed, cooperative, appears fit.  Evident difficulty hearing: YES--has hearing aids  Skin: negative, other than a slightly erythematous warty type lesion measuring about 1 cm idn diam on R arm near elbow--looks like seb K.  Head: Normocephalic. No masses, lesions, tenderness or abnormalities  Eyes: Lids/periorbital skin normal, Conjunctivae/corneas clear, PERRL, EOM's intact  Ears: External ears normal. Canals clear. TM's normal., hearing aids  Nose:normal  Oropharynx: Lips, mucosa, and tongue normal. Teeth and gums normal., posterior pharynx without erythema or drainage  Neck: supple. No adenopathy. Thyroid symmetric, normal size, without nodules  Lungs: clear to auscultation though breath sounds diminished bilat  Heart: normal rate, regular rhythm and no murmurs, clicks, or gallops  Abd: soft, non-tender. BS normal. No masses or organomegaly  GU: deferred  Rectal: neg  Prostate: Deferred  Ext: Normal, without deformities, edema, or skin discoloration  Neuro:  Grossly normal to observation, gait normal    ASSESSMENT AND PLAN:    1.  Routine screening:       Total nonfasting cholesterol: YES       Screen for Type 2 DM w/fasting plasma glucose: YES       PSA: YES       TSH for hypothyroidism: YES       Colon Ca screening with Hemoccults x 3: had cT colonography done recently       Colon Ca screening with flexible sigmoidoscopy: see above       Screening for AAA with ultrasound: followed for this by vascular surgery       Screening for STD's including HIV: NO       Screening for hepatitis C: NO       Vision screening: NO    2. Immunizations today:       Td: NO       Varicella: NO        Pneumococcal: had it in 2004       Influenza: NO       Hep A vaccine: NO    3. Preventive counseling: skin Ca prevention & skin self-exam, moderation in EtOH, healthy dietary   guidelines, proper exercise    4. Lesion on R arm treated again with liquid N2--3 freeze-thaw cycles with "gun".  If this does not work--consider biopsy vs excisional biopsy vs derm referral.    5. Axillary rash--probably  fungal .  Will try spectazole with triamcinolone 0.1%.  If no help, consider derm referral.    6. PSA ordered.  If elevated--will probably have to see urology again.    This note was generated using a standard template approved by the Medical Director.    Sherlean Foot, MD

## 2007-01-12 ENCOUNTER — Telehealth (HOSPITAL_BASED_OUTPATIENT_CLINIC_OR_DEPARTMENT_OTHER): Payer: Self-pay | Admitting: Family Medicine

## 2007-01-12 NOTE — Telephone Encounter (Signed)
Received email from pt who noted that he got both a nifedipine and procardia prescription recently.  He rightly noted that these were the same medication, and destroyed the procardia script.  I will note that on his medication list. ct

## 2007-01-19 ENCOUNTER — Ambulatory Visit (HOSPITAL_BASED_OUTPATIENT_CLINIC_OR_DEPARTMENT_OTHER): Payer: PRIVATE HEALTH INSURANCE | Admitting: Physician Assistant

## 2007-01-19 VITALS — BP 126/80 | HR 78 | Wt 225.0 lb

## 2007-01-19 NOTE — Progress Notes (Addendum)
OD- 20/20  OS--20/20  OU-20/15

## 2007-01-19 NOTE — Progress Notes (Addendum)
Addended by: Edsel Petrin on: 01/19/2007 10:27:48 AM     Modules accepted: Orders

## 2007-01-19 NOTE — Progress Notes (Signed)
Tracy Conway is a 72 year old male here to discuss the following:    372.72 CONJUNCTIVAL HEMORRHAGE  (primary encounter diagnosis)    Last night working on computer, noted slight right eye irrititation.  Working on computer at the time. No visual changes.  Woke up this morning with red eye.  Slight scratchiness, otherwise no other symtpoms.    Patient Active Problem List   Diagnoses Code   . HYPERTENSION NOS(aka HTN) 401.9   . PURE HYPERCHOLESTEROLEM 272.0   . MALIG NEOPLASM DORSAL TONGUE(aka CANCER) 141.1   . Chronic Kidney Disease -- Stage III 588.89   . TOBACCO USE DISORDER 305.1   . HEARING LOSS NOS 389.9   . ECZEMA  692.9   . ERECTILE DYSFUNCTION 607.84   . BENIGN NEOPLASM LG BOWEL 211.3   . CALCULUS OF KIDNEY 592.0   . COPD 496   . THORACIC AORTIC ANEURYSM 441.2   . ABDOM AORTIC ANEURYSM 441.4   . VENTRICULAR SEPT DEFECT 745.4   . HYPERTROPHY PROSTATE W/O OBST 600.00   . GENERAL OSTEOARTHROSIS 715.00   . DIVERTICULITIS OF COLON W/O BLEED 562.11       Current outpatient prescriptions prior to encounter   Medication Sig Dispense Refill   . NIFEDIPINE 90 MG OR TBCR Take 1 tablet by mouth every day  90  4   . LOTENSIN 20 MG OR TABS Take 3 tablets by mouth every day  180  0   . ATENOLOL 50 MG OR TABS 1 TABLET DAILY  90  4   . FLOMAX 0.4 MG OR CP24 1 cap po qd, 1/2 hour after same meal of each day  90  prn   . HYDROCHLOROTHIAZIDE 12.5 MG OR CAPS 1 TABLET DAILY  90  4   . LIPITOR 40 MG OR TABS 1 TABLET DAILY  90  4   . SPECTAZOLE 1 % EX CREA apply sparingly bid to affected area  30  0   . TRIAMCINOLONE ACETONIDE 0.1 % EX OINT apply sparingly bid to affected area  30 g  0   . ASPIRIN 81 MG OR TABS 1 TABLET DAILY  90  4   . CLINDAMYCIN HCL 300 MG OR CAPS 2 caps prn prior to dental surgery               REVIEW OF SYSTEMS:  See HPI    OBJECTIVE:  BP 126/80  Pulse 78  Wt 225 lb (102.059 kg)  Visual Acuity:  20/20 OU; 20/20 OD; 20/20 AS  General: healthy, alert, no distress  HEENT:  Atraumatic; PERRL, EOMI,  Conjunctiva: OS: clear  OD: nasal half with obvious subconjunctival bleed.    ASSESSMENT AND PLAN:    372.72 CONJUNCTIVAL HEMORRHAGE  (primary encounter diagnosis)  Comment:   Plan: FOB LEVEL III        Reasurance and education      Sherrilee Gilles, PharmD,PA-C

## 2007-01-20 ENCOUNTER — Encounter (HOSPITAL_BASED_OUTPATIENT_CLINIC_OR_DEPARTMENT_OTHER): Payer: PRIVATE HEALTH INSURANCE | Admitting: Orthopaedic Surgery

## 2007-01-24 ENCOUNTER — Telehealth (HOSPITAL_BASED_OUTPATIENT_CLINIC_OR_DEPARTMENT_OTHER): Payer: Self-pay | Admitting: Family Medicine

## 2007-01-24 NOTE — Telephone Encounter (Signed)
Patient requesting lab orders same as the one done on 10/09/2005

## 2007-01-24 NOTE — Telephone Encounter (Signed)
Message forwarded to Dr Burgess Estelle, please advisetanner,

## 2007-01-26 NOTE — Telephone Encounter (Signed)
Ordered labs according to last visit plan, patient will come in tomorrow fasting.

## 2007-01-27 LAB — LIPID PANEL
Cholesterol (LDL): 68 mg/dL (ref <130)
Cholesterol/HDL Ratio: 3.3
HDL Cholesterol: 35 mg/dL — ABNORMAL LOW (ref >40)
Non-HDL Cholesterol: 79 mg/dL (ref 0–159)
Total Cholesterol: 114 mg/dL (ref <200)
Triglyceride: 56 mg/dL (ref <150)

## 2007-01-27 LAB — COMPREHENSIVE METABOLIC PANEL
ALT (GPT): 21 U/L (ref 10–48)
AST (GOT): 25 U/L (ref 15–40)
Albumin: 3.7 g/dL (ref 3.5–5.2)
Alkaline Phosphatase (Total): 44 U/L (ref 36–161)
Anion Gap: 5 (ref 3–11)
Bilirubin (Total): 0.8 mg/dL (ref 0.2–1.3)
Calcium: 9.5 mg/dL (ref 8.9–10.2)
Carbon Dioxide, Total: 29 mEq/L (ref 22–32)
Chloride: 107 mEq/L (ref 98–108)
Creatinine: 1.5 mg/dL — ABNORMAL HIGH (ref 0.2–1.1)
GFR, Calc, African American: 56 mL/min — ABNORMAL LOW (ref >59)
GFR, Calc, European American: 46 mL/min — ABNORMAL LOW (ref >59)
Glucose: 104 mg/dL (ref 62–125)
Potassium: 4.7 mEq/L (ref 3.7–5.2)
Protein (Total): 6.4 g/dL (ref 6.0–8.2)
Sodium: 141 mEq/L (ref 136–145)
Urea Nitrogen: 24 mg/dL — ABNORMAL HIGH (ref 8–21)

## 2007-01-27 LAB — THYROID STIMULATING HORMONE: Thyroid Stimulating Hormone: 1.526 u[IU]/mL (ref 0.400–5.00)

## 2007-01-27 LAB — PSA, FREE
Prostate Specific Antigen (Free) %: 25 %
Prostate Specific Antigen (Free): 1.15 ng/mL

## 2007-01-27 LAB — PSA, TOTAL & REFLEXIVE FREE: PSA, Diagnostic/Monitoring: 4.6 ng/mL — ABNORMAL HIGH (ref 0.00–4.00)

## 2007-02-16 ENCOUNTER — Other Ambulatory Visit (HOSPITAL_BASED_OUTPATIENT_CLINIC_OR_DEPARTMENT_OTHER): Payer: Self-pay

## 2007-02-18 ENCOUNTER — Other Ambulatory Visit (HOSPITAL_BASED_OUTPATIENT_CLINIC_OR_DEPARTMENT_OTHER): Payer: PRIVATE HEALTH INSURANCE

## 2007-02-22 ENCOUNTER — Encounter (HOSPITAL_BASED_OUTPATIENT_CLINIC_OR_DEPARTMENT_OTHER): Payer: PRIVATE HEALTH INSURANCE | Admitting: Cardiac Surgery

## 2007-03-09 ENCOUNTER — Telehealth (HOSPITAL_BASED_OUTPATIENT_CLINIC_OR_DEPARTMENT_OTHER): Payer: Self-pay | Admitting: Family Medicine

## 2007-03-09 NOTE — Telephone Encounter (Signed)
Pt calling to request a referral to urology,also want to discuss u/s of his neck.Thanks

## 2007-03-11 NOTE — Telephone Encounter (Signed)
Referrals completed.  Lisa--can you call pt to let him know how to go about making the appointments?  Thanks  ct

## 2007-03-17 ENCOUNTER — Encounter (INDEPENDENT_AMBULATORY_CARE_PROVIDER_SITE_OTHER): Payer: Self-pay | Admitting: Physician Assistant

## 2007-03-17 ENCOUNTER — Encounter (HOSPITAL_BASED_OUTPATIENT_CLINIC_OR_DEPARTMENT_OTHER): Payer: PRIVATE HEALTH INSURANCE | Admitting: Orthopaedic Surgery

## 2007-03-21 ENCOUNTER — Encounter (HOSPITAL_BASED_OUTPATIENT_CLINIC_OR_DEPARTMENT_OTHER): Payer: PRIVATE HEALTH INSURANCE

## 2007-03-21 ENCOUNTER — Encounter (HOSPITAL_BASED_OUTPATIENT_CLINIC_OR_DEPARTMENT_OTHER): Payer: PRIVATE HEALTH INSURANCE | Admitting: Internal Medicine

## 2007-03-21 ENCOUNTER — Encounter (HOSPITAL_BASED_OUTPATIENT_CLINIC_OR_DEPARTMENT_OTHER): Payer: PRIVATE HEALTH INSURANCE | Admitting: Registered Nurse

## 2007-03-31 ENCOUNTER — Encounter (HOSPITAL_BASED_OUTPATIENT_CLINIC_OR_DEPARTMENT_OTHER): Payer: PRIVATE HEALTH INSURANCE

## 2007-04-13 ENCOUNTER — Encounter (HOSPITAL_BASED_OUTPATIENT_CLINIC_OR_DEPARTMENT_OTHER): Payer: PRIVATE HEALTH INSURANCE

## 2007-04-13 ENCOUNTER — Encounter (HOSPITAL_COMMUNITY): Payer: PRIVATE HEALTH INSURANCE | Admitting: Orthopaedic Surgery

## 2007-04-15 ENCOUNTER — Encounter (HOSPITAL_BASED_OUTPATIENT_CLINIC_OR_DEPARTMENT_OTHER): Payer: Self-pay

## 2007-04-15 ENCOUNTER — Encounter (HOSPITAL_BASED_OUTPATIENT_CLINIC_OR_DEPARTMENT_OTHER): Payer: PRIVATE HEALTH INSURANCE

## 2007-04-22 ENCOUNTER — Encounter (HOSPITAL_BASED_OUTPATIENT_CLINIC_OR_DEPARTMENT_OTHER): Payer: PRIVATE HEALTH INSURANCE | Admitting: Orthopaedic Surgery

## 2007-05-06 ENCOUNTER — Encounter (HOSPITAL_BASED_OUTPATIENT_CLINIC_OR_DEPARTMENT_OTHER): Payer: PRIVATE HEALTH INSURANCE | Admitting: Orthopaedic Surgery

## 2007-05-06 ENCOUNTER — Telehealth (HOSPITAL_BASED_OUTPATIENT_CLINIC_OR_DEPARTMENT_OTHER): Payer: Self-pay | Admitting: Family Medicine

## 2007-05-06 MED ORDER — LOTENSIN 20 MG OR TABS
ORAL_TABLET | ORAL | Status: DC
Start: 2007-05-06 — End: 2008-03-08

## 2007-05-06 NOTE — Telephone Encounter (Signed)
Needs refill on benazepril 20. Prescription written out and at the front desk for patient pick up. ct

## 2007-05-11 ENCOUNTER — Encounter (HOSPITAL_BASED_OUTPATIENT_CLINIC_OR_DEPARTMENT_OTHER): Payer: PRIVATE HEALTH INSURANCE | Admitting: Nephrology

## 2007-05-27 ENCOUNTER — Encounter (HOSPITAL_BASED_OUTPATIENT_CLINIC_OR_DEPARTMENT_OTHER): Payer: PRIVATE HEALTH INSURANCE | Admitting: Orthopaedic Surgery

## 2007-05-30 ENCOUNTER — Encounter (HOSPITAL_BASED_OUTPATIENT_CLINIC_OR_DEPARTMENT_OTHER): Payer: Self-pay | Admitting: Family Medicine

## 2007-06-17 ENCOUNTER — Encounter (INDEPENDENT_AMBULATORY_CARE_PROVIDER_SITE_OTHER): Payer: Self-pay | Admitting: Orthopaedic Surgery

## 2007-07-08 ENCOUNTER — Encounter (HOSPITAL_BASED_OUTPATIENT_CLINIC_OR_DEPARTMENT_OTHER): Payer: PRIVATE HEALTH INSURANCE | Admitting: Orthopaedic Surgery

## 2007-08-10 ENCOUNTER — Encounter (HOSPITAL_BASED_OUTPATIENT_CLINIC_OR_DEPARTMENT_OTHER): Payer: Self-pay | Admitting: Family Medicine

## 2007-08-24 ENCOUNTER — Encounter (HOSPITAL_BASED_OUTPATIENT_CLINIC_OR_DEPARTMENT_OTHER): Payer: PRIVATE HEALTH INSURANCE | Admitting: Registered Nurse

## 2007-08-24 ENCOUNTER — Encounter (HOSPITAL_BASED_OUTPATIENT_CLINIC_OR_DEPARTMENT_OTHER): Payer: PRIVATE HEALTH INSURANCE | Admitting: Nephrology

## 2007-09-16 ENCOUNTER — Encounter (HOSPITAL_BASED_OUTPATIENT_CLINIC_OR_DEPARTMENT_OTHER): Payer: BLUE CROSS/BLUE SHIELD | Admitting: Registered Nurse

## 2007-10-19 ENCOUNTER — Encounter (HOSPITAL_BASED_OUTPATIENT_CLINIC_OR_DEPARTMENT_OTHER): Payer: BLUE CROSS/BLUE SHIELD | Admitting: Nephrology

## 2007-11-17 ENCOUNTER — Ambulatory Visit (HOSPITAL_BASED_OUTPATIENT_CLINIC_OR_DEPARTMENT_OTHER): Payer: BLUE CROSS/BLUE SHIELD

## 2007-11-17 NOTE — Progress Notes (Signed)
FOB Worksheet for [employer] [facility]  Arrived:  YES  Vital(s) &/or pain assessment:  NO  Lab, chart, reports &/or meds reviewed:  NO  Symptoms, diagnosis, plan, meds &/or other explained/clarified:  NO  Room use 26-50 min (excludes wait time):  NO  Room use >50 min (excludes wait time):  NO  Chaperone: NO  Other procedures (not separately billed):  NO    MA-only visit  YES  Other (specify):  injection    Total number of YES responses: 2  Completed by: Edsel Petrin      Pt here for second Hep B vaccine. First one done 4 weeks ago at [facility]

## 2007-12-15 ENCOUNTER — Encounter (HOSPITAL_BASED_OUTPATIENT_CLINIC_OR_DEPARTMENT_OTHER): Payer: BLUE CROSS/BLUE SHIELD

## 2007-12-27 ENCOUNTER — Telehealth (HOSPITAL_BASED_OUTPATIENT_CLINIC_OR_DEPARTMENT_OTHER): Payer: Self-pay | Admitting: Family Medicine

## 2007-12-27 NOTE — Telephone Encounter (Signed)
I will have to do this next week. ct

## 2007-12-27 NOTE — Telephone Encounter (Signed)
Insurance is requesting prior authorization form  And proof of prerequisite therapy call [phone].  Please advise.  Thanks

## 2008-01-06 ENCOUNTER — Telehealth (HOSPITAL_BASED_OUTPATIENT_CLINIC_OR_DEPARTMENT_OTHER): Payer: Self-pay | Admitting: Family Medicine

## 2008-01-06 NOTE — Telephone Encounter (Signed)
Patient requesting the attached medications

## 2008-01-09 ENCOUNTER — Telehealth (HOSPITAL_BASED_OUTPATIENT_CLINIC_OR_DEPARTMENT_OTHER): Payer: Self-pay | Admitting: Family Medicine

## 2008-01-09 NOTE — Telephone Encounter (Signed)
Pharmacy faxed patient refill request.  Please advise, thanks.  Pharmacy requesting a generic version for Lipitor.  Insurance wanting generic first.

## 2008-01-11 MED ORDER — ATENOLOL 50 MG OR TABS
ORAL_TABLET | ORAL | Status: DC
Start: 2008-01-06 — End: 2008-01-12

## 2008-01-11 MED ORDER — HYDROCHLOROTHIAZIDE 12.5 MG OR CAPS
ORAL_CAPSULE | ORAL | Status: DC
Start: 2008-01-11 — End: 2008-03-08

## 2008-01-11 MED ORDER — ATORVASTATIN CALCIUM 40 MG OR TABS
ORAL_TABLET | ORAL | Status: DC
Start: 2008-01-09 — End: 2008-01-13

## 2008-01-11 NOTE — Telephone Encounter (Signed)
Ok to refill, except I ordered generic microzide which is HCTZ.  ct

## 2008-01-11 NOTE — Telephone Encounter (Signed)
Ok to refill, though I refused the hctz as I just refilled this in another message instead of microzide. ct

## 2008-01-12 ENCOUNTER — Telehealth (HOSPITAL_BASED_OUTPATIENT_CLINIC_OR_DEPARTMENT_OTHER): Payer: Self-pay | Admitting: Family Medicine

## 2008-01-12 NOTE — Telephone Encounter (Signed)
Pharmacy faxed patient refill request.  Please advise, thanks.  Also :ipitor is not covered by patients medicare D plan, alternative is generic statin, such as simvastatin, pravastatin, lovastatin.  If patient has tried and failed lipitor, patient request by Dr. Is required.  Please advise.

## 2008-01-13 MED ORDER — ATENOLOL 50 MG OR TABS
ORAL_TABLET | ORAL | Status: DC
Start: 2008-01-12 — End: 2009-05-15

## 2008-01-13 MED ORDER — SIMVASTATIN 40 MG OR TABS
ORAL_TABLET | ORAL | Status: DC
Start: 2008-01-13 — End: 2009-05-15

## 2008-01-13 NOTE — Telephone Encounter (Signed)
Will substitute Simvastatin for the atorvastatin and see how pt does. ct

## 2008-01-13 NOTE — Telephone Encounter (Signed)
Substituted simvastatin instead. ct

## 2008-01-30 ENCOUNTER — Encounter (HOSPITAL_BASED_OUTPATIENT_CLINIC_OR_DEPARTMENT_OTHER): Payer: Self-pay | Admitting: Family Medicine

## 2008-02-28 ENCOUNTER — Encounter (HOSPITAL_BASED_OUTPATIENT_CLINIC_OR_DEPARTMENT_OTHER): Payer: Self-pay | Admitting: Family Medicine

## 2008-02-28 NOTE — Telephone Encounter (Signed)
From: Lisbeth Ply   To: Sherlean Foot   Sent: Sun Feb 26, 2008 9:47 PM   Subject: Nov 12 appointment    Hello Dr. Burgess Estelle,    I would like to get the seasonal flu vacine during my appointment.  Also, durong the past year I am experiencing more shortness of breath. Please check tests done prior to and during my hospital stay for my knee replacement for some cause other than emphysema if possible.    Thanks,

## 2008-03-01 ENCOUNTER — Encounter (HOSPITAL_BASED_OUTPATIENT_CLINIC_OR_DEPARTMENT_OTHER): Payer: Self-pay | Admitting: Family Medicine

## 2008-03-08 ENCOUNTER — Encounter (HOSPITAL_BASED_OUTPATIENT_CLINIC_OR_DEPARTMENT_OTHER): Payer: Self-pay | Admitting: Family Medicine

## 2008-03-08 ENCOUNTER — Ambulatory Visit (HOSPITAL_BASED_OUTPATIENT_CLINIC_OR_DEPARTMENT_OTHER): Payer: PPO | Admitting: Family Medicine

## 2008-03-08 VITALS — BP 115/64 | HR 58 | Wt 233.0 lb

## 2008-03-08 MED ORDER — BENAZEPRIL HCL 20 MG OR TABS
ORAL_TABLET | ORAL | Status: DC
Start: 2008-03-08 — End: 2009-05-15

## 2008-03-08 MED ORDER — ALBUTEROL 90 MCG/ACT IN AERS
INHALATION_SPRAY | RESPIRATORY_TRACT | Status: DC
Start: 2008-03-08 — End: 2009-05-24

## 2008-03-08 MED ORDER — ALBUTEROL 90 MCG/ACT IN AERS
INHALATION_SPRAY | RESPIRATORY_TRACT | Status: DC
Start: 2008-03-08 — End: 2009-05-15

## 2008-03-08 MED ORDER — HYDROCHLOROTHIAZIDE 25 MG OR TABS
ORAL_TABLET | ORAL | Status: DC
Start: 2008-03-08 — End: 2009-05-15

## 2008-03-08 NOTE — Progress Notes (Signed)
FOB Worksheet for MA Services  Arrived:  YES  Vital(s) &/or pain assessment:  YES  Lab, chart, reports &/or meds reviewed:  YES  Medication(s)  Symptoms, diagnosis, plan, meds &/or other explained/clarified:  YES   Symptom(s)  Room use 26-50 min (excludes wait time):  NO  Room use >50 min (excludes wait time):  NO  Chaperone: NO  Other procedures (not separately billed):  NO    MA-only visit  NO    Total number of YES responses: 3  Completed by: Russia Scheiderer

## 2008-03-08 NOTE — Progress Notes (Signed)
Tracy Conway is a 73 year old male here for an annual health maintenance exam. Other concerns today include the following:     General Health/blood/urine values.  Which were discussed.      Shortness of Breath/COPD: Feels SOB is getting worse.  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  Ok flat ground.  Now 2-3 flight DOE.  No dyspnea at rest. Quit smoking 9 years ago. 40 pack/yr history with diagnosis of COPD based on PFTs from ?1993/1994?  Never on meds.      Current dietary habits: Good.  High fiber.  Lots of "greens", chicken.  Red meat 1x/wk.  Admits needs to drink more water.  Current exercise habits: none    Meds:  Nifedipine 90mg  qAm  HCTZ 15.5mg  qAM  Atenolol 50mg  qAM  Benazepril 40mg  qAM  Avodart 0.5mg  qHS  Simvastatin 40mg  qHS  Vit. D 50,000 IU Awilda Metro  ASA 81mg  qAM  Omega 3 1300mg  qAM    L knee replacement 03/2008: Much improved; pain and functionality.    Patient Active Problem List   Diagnoses Date Noted   GENERAL OSTEOARTHROSIS [715.00] 01/07/2007   Priority: High   Overview Note:   Has seen orthopedics--cortisone shot to L knee helped.     DIVERTICULITIS OF COLON W/O BLEED [562.11] 01/07/2007   Priority: High   Overview Note:   Hospitalized at [facility] May 2008     TOBACCO USE DISORDER [305.1] 12/24/2005   Priority: High   Overview Note:   Quit 2000.  EtOH--quit 1986.     HEARING LOSS NOS [389.9] 12/24/2005   Priority: High   Overview Note:   Bilat hearing aids.     ECZEMA  [692.9] 12/24/2005   Priority: High   Overview Note:   Lower extremities in particular.     ERECTILE DYSFUNCTION [607.84] 12/24/2005   Priority: High      BENIGN NEOPLASM LG BOWEL [211.3] 12/24/2005   Priority: High   Overview Note:   Tubular adenoma. Dx'ed flex sig 2000--> colonoscopy 2000: N.  F/U 2003: mild diverticulosis, tubular adenoma.  F/U due 2008 with CT colonography.--done 7/08: ess. N.  F/U??     CALCULUS OF KIDNEY [592.0] 12/24/2005   Priority: High   Overview Note:      Lithotripsy 1988  Stone panel in 0ct 09: high oxalate.       COPD [496] 12/24/2005   Priority: High   Overview Note:   emphysema     THORACIC AORTIC ANEURYSM [441.2] 12/24/2005   Priority: High   Overview Note:   Ascending aortic aneurysm.  Followed by Dr. Brunilda Payor, Cardiac surgery.  Gets regular CT scans.  Goal systolic BP: 100-120     ABDOM AORTIC ANEURYSM [441.4] 12/24/2005   Priority: High   Overview Note:   Borderline, seen on CT 8/06.     VENTRICULAR SEPT DEFECT [745.4] 12/24/2005   Priority: High   Overview Note:   Small muscular VSD--NEEDS SBE PROPHYLAXIS. (Clindamycin 600 mg prior to dental visits.)     HYPERTROPHY PROSTATE W/O OBST [600.00] 12/24/2005   Priority: High      Chronic Kidney Disease -- Stage III [588.89] 07/22/2005   Priority: High   Overview Note:   Creat ~ 1.8. Normal albuminuria (8/08)  Likely 2/2 HTN, poss component from hx obstructing nephrolithiasis.  8/08 Renal: referred to Uro for 8mm stone, nonobstructing. Also doing w/u for stone type, poss target prevention.     HYPERTENSION  NOS(aka HTN) [401.9] 03/29/2002   Priority: High      PURE HYPERCHOLESTEROLEM [272.0] 03/29/2002   Priority: High      MALIG NEOPLASM DORSAL TONGUE(aka CANCER) [141.1] 03/29/2002   Priority: High   Overview Note:   Ssurgery 1994, T1N1 squamous cell ca L tongue, partial glossectomy; L supraorbital hyoid neck dissection             Past Medical History   Diagnosis Date    AORTIC ANEURYSM NOS      Aortic Aneurysm    MALIG NEOPLASM DORSAL TONGUE(aka CANCER) 03/29/2002     Ssurgery 1994, T1N1 squamous cell ca L tongue, partial glossectomy; L supraorbital hyoid neck dissection     TOBACCO USE DISORDER 12/24/2005     Quit 2000.    HEARING LOSS NOS 12/24/2005     Bilat hearing aids.    ECZEMA  12/24/2005     Lower extremities in particular.    ERECTILE DYSFUNCTION 12/24/2005    BENIGN NEOPLASM LG BOWEL 12/24/2005     Tubular adenoma. Dx'ed flex sig 2000--> colonoscopy 2000: N.  F/U 2003: mild diverticulosis, tubular  adenoma.  F/U due 2008 with CT colonography.    IMPAIRED RENAL FUNCT DISORDERS NEC 07/22/2005     Creat ~ 1.2    CALCULUS OF KIDNEY 12/24/2005     Lithotripsy 1988    COPD 12/24/2005     emphysema    THORACIC AORTIC ANEURYSM 12/24/2005     Ascending aortic aneurysm.  Followed by Dr. Brunilda Payor, Cardiac surgery.  Gets regular CT scans.  Goal systolic BP: 100-120    ABDOM AORTIC ANEURYSM 12/24/2005     Borderline, seen on CT 8/06.    VENTRICULAR SEPT DEFECT 12/24/2005     Small muscular VSD--NEEDS SBE PROPHYLAXIS. (Clindamycin 600 mg prior to dental visits.)    HYPERTROPHY PROSTATE W/O OBST 12/24/2005    GENERAL OSTEOARTHROSIS 01/07/2007     Has seen orthopedics--cortisone shot to L knee helped.    DIVERTICULITIS OF COLON W/O BLEED 01/07/2007     Hospitalized at [facility] May 2008           Past Surgical History   Procedure Date    Ventral hernia repair      x3    Removal of tongue carcinoma with lymphadenectomy     Fusion l foot distal phalange     Lithotrypsy of r renal calculi     Retinaculum release b/l wrists     Vasectomy w/tray      x2    Vasectomy reversal          Family History   Problem Relation    Cancer Father     Prostate    Arthritis Father    Other Father     Chronic Kidney Disease    Psych Mother     Alzheimer's Disease         MEDICATIONS: See list below (updated today)    History   Social History    Marital Status: Married     Spouse Name: N/A     Number of Children: 5    Years of Education: N/A   Occupational History    Associate Professor   Social History Main Topics    Tobacco Use: Quit      quit three years ago    Alcohol Use: No      quit 17 years ago    Drug Use: No    Sexually Active:  Yes -- Male partner(s)      no protection   Other Topics Concern    Not on file   Social History Narrative    No narrative on file           REVIEW OF SYSTEMS:    CONSTITUTIONAL: Denies, fatigue, daytime somnolence, unexpected weight loss, unexpected weight gain, fever, chills    NEUROLOGIC:  Denies, headaches and tremors Occ STM loss.    OPHTHALMIC: Denies, change in vision, discharge, redness, eye pain R eye floater    ENT: Denies, nasal congestion, any ear nose or throat problems and hearing difficulties Has R eye floater.  Known and stable.    RESPIRATORY: Denies, dyspnea at rest     CARDIOVASCULAR: Denies, chest pain or pressure at rest or during exercise, palpitations, orthopnea, paroxysmal nocturnal dyspnea,     GI: Denies, change in appetite, nausea, vomiting, dyspepsia, abdominal pain, diarrhea, constipation, hematochezia, melena    GENITOURINARY: Denies, urethral discharge, hematuria, dysuria, endorse weak steram/stable erectile dysfunction     INTEGUMENTARY: Denies, skin rash, easy bruising, changes in moles or new moles    RHEUMATOLOGIC/MSK: Denies, joint swelling, joint stiffness, any joint or arthritic problems L 3 fingers, R elbow/sholder.    ENDOCRINE: Denies, polyuria, polydipsia    PSYCHOSOCIAL: Denies, depressed mood, anhedonia, sleep disturbance and any psychological problems    HIV RISK: Denies and any HIV risk      PHYSICAL EXAM:    General: healthy, alert, no distress, alert and oriented x 3  Evident difficulty hearing: YES, resolved with hearing aids bilaterally  Skin: Skin color, texture, turgor normal. No rashes or concerning lesions.  Multiple tattoos on forearms/biceps bilaterally  Head: Normocephalic. No masses, lesions, tenderness or abnormalities  Eyes: Lids/periorbital skin normal, Conjunctivae/corneas clear, PERRL, EOM's intact  Oropharynx: Lips, mucosa, and tongue normal. Teeth and gums normal., posterior pharynx without erythema or drainage.  Tongue with defect on L side 2/2 cancer removal  Neck: supple. No adenopathy. Thyroid symmetric, normal size, without nodules.  L neck diagonal scar from lymph node removal 2/2 tongue cancer  Lungs: clear to auscultation, distant breath sounds, prolonged expiratory phase, no rhales, no rhonchi  Heart: normal rate, regular rhythm  and no murmurs, clicks, or gallops  Abd: soft, non-tender. BS normal. No organomegaly;  Midline bulge on valsalva 20cm, with well healed vertical scar s/p ventral hernia repair   GU: deferred  Rectal: deferred  Prostate: Deferred  Ext: 2+ radial pulses, 1+ DP, PT not felt, 2+ pitting edema to about ankle, 1+ edema to midshin, equal bilaterally.  Dilated tortuous small veins also noted.  No skin pigment changes apparent.  Neuro: Strength 5/5, sensation intact.  MMSE: A&Ox3, Recall intact, concentration intact.    ASSESSMENT AND PLAN:  5 y /o man with multiple medical problems here for an annual health maintenance exam; Dyspnea advancing, and lower extremity edema.  Unclear if two are related, clinical exam neg for fluid overload other than edema.  Lung exam demonstrates distant breath sounds but no rhales.  Venous obstruction also a possibility to explain edema, but bilateral nature and lack of skin changes around feet and ankles make this less likely.     1.  Dyspnea on exertion/Lower Extremity Edema  -- given Albuterol for rescue  -- consider formal PFTs to assess lung function and to base initiation of treatment  -- TTE to asses cardiac function    2.  Routine screening:       Total nonfasting cholesterol: YES  Screen for Type 2 DM w/fasting plasma glucose: NO       PSA: YES       TSH for hypothyroidism: NO       Colon Ca screening with Hemoccults x 3: NO       Colon Ca screening with flexible sigmoidoscopy: NO       Screening for AAA with ultrasound: Pt with known stable AAA       Screening for STD's including HIV: NO       Screening for hepatitis C: NO       Vision screening: NO    2. Immunizations today:       Td: NO       Varicella: NO (has had clinical varicella)       Pneumococcal: NO       Influenza: NO       Hep A vaccine: NO    3. Preventive counseling: healthy dietary guidelines, proper exercise    This note was generated using a standard template approved by the Medical Director.    Lavone Neri  MSIV

## 2008-03-08 NOTE — Progress Notes (Signed)
Tracy Conway is a 73 year old male here for an annual health maintenance exam. Other concerns today include the following:       Here for yearly exam. Pt seemalong with Ferne Reus his note for details.  Main concern at present is for SOBOE that seems associated with the development of lower extrem edema. Has past Hx copd from smoking.      REVIEW OF SYSTEMS:    See Lavone Neri, MSIV's note.      PHYSICAL EXAM:    BP 115/64  Pulse 58  Wt 233 lb (105.688 kg)    General: healthy, alert, no distress, alert and oriented x 3  Evident difficulty hearing: YES--wears hearing aids  Skin: Skin color, texture, turgor normal. No rashes or concerning lesions  Head: Normocephalic. No masses, lesions, tenderness or abnormalities  Eyes: Lids/periorbital skin normal, Conjunctivae/corneas clear, PERRL, EOM's intact  Ears: External ears normal. Canals clear. TM's normal.  Nose:normal  Oropharynx: Lips, mucosa, and tongue normal. Teeth and gums normal., posterior pharynx without erythema or drainage, tongue abnormality 2/2 prior cancer surgery.  Neck: supple. No adenopathy. Thyroid symmetric, normal size, without nodules  Lungs: clear to auscultation and decreased breath sounds bilat, no crackles heard.  Heart: normal rate, regular rhythm and no murmurs, clicks, or gallops  Abd: soft, non-tender. BS normal. No masses or organomegaly  GU: deferred  Rectal: deferred  Prostate: Deferred  Ext: positive findings: some peripheral edema, decreased pulses peripherally  Neuro:  Grossly normal to observation, gait normal    ASSESSMENT AND PLAN:    1. Routine screening:       Total nonfasting cholesterol: YES       Screen for Type 2 DM w/fasting plasma glucose: YES       PSA: YES       TSH for hypothyroidism: NO       Colon Ca screening with Hemoccults x 3: N/A       Colon Ca screening with flexible sigmoidoscopy: no colonoscopy       Screening for AAA with ultrasound: followed by vasc surgery       Screening for STD's including HIV:  NO       Screening for hepatitis C: NO       Vision screening: NO    2. Immunizations today:       Td: NO       Varicella: NO        Pneumococcal: NO       Influenza: NO       Hep A vaccine: NO    3. Preventive counseling: importance of regular dental care, moderation in EtOH, healthy dietary guidelines, consider calcium supplementation for fracture prevention, proper exercise, vit d    4. Regarding the soboe and the peripheral edema, i think we have to rule out CHF.  Will send for cardiac echo. Last echo was stress echo in 2007 which was non-diagnositc for ischemia and no wall motion abnormalities were noted. I would like to see him again after he has the echo.    This note was generated using a standard template approved by the Medical Director.

## 2008-03-10 ENCOUNTER — Other Ambulatory Visit (HOSPITAL_BASED_OUTPATIENT_CLINIC_OR_DEPARTMENT_OTHER): Payer: Self-pay | Admitting: Family Medicine

## 2008-03-12 NOTE — Telephone Encounter (Signed)
From: Tracy Conway   To: Sherlean Foot   Sent: Sat Mar 10, 2008 10:08 AM   Subject: Medication Renewal Request    Original authorizing provider: Sherlean Foot, MD    Tracy Conway would like a refill of the following medications:  NIFEDIPINE 90 MG OR TBCR Sherlean Foot, MD]    Preferred pharmacy: [facility] [place]    Comment:  Forgot to get this RX on Thursday 03/08/2008.    Medication renewals requested in this message routed to other providers:

## 2008-03-12 NOTE — Progress Notes (Signed)
FOB Worksheet for [facility] Services  Scheduled 1 test, consult or follow-up appt (on date of service):  YES appointment  Scheduled 2 or more test(s), consult(s), admission, surgery planning, pre-auth, &/or surgery scheduling (w/in 48 hrs of appt):  NO  Other procedures (not separately billed):  NO    Total number of YES responses: 1  Completed by: Lisa Fontanos

## 2008-03-13 ENCOUNTER — Encounter (HOSPITAL_BASED_OUTPATIENT_CLINIC_OR_DEPARTMENT_OTHER): Payer: Self-pay

## 2008-03-13 MED ORDER — NIFEDIPINE 90 MG OR TBCR
EXTENDED_RELEASE_TABLET | ORAL | Status: DC
Start: 2008-03-12 — End: 2008-06-07

## 2008-03-13 NOTE — Telephone Encounter (Signed)
Pt completely out!  Please refill ASAP!

## 2008-03-13 NOTE — Telephone Encounter (Signed)
Prescription refilled for three months as is chronic medication.

## 2008-03-19 ENCOUNTER — Encounter (HOSPITAL_BASED_OUTPATIENT_CLINIC_OR_DEPARTMENT_OTHER): Payer: PPO | Admitting: Registered Nurse

## 2008-03-30 ENCOUNTER — Encounter (HOSPITAL_BASED_OUTPATIENT_CLINIC_OR_DEPARTMENT_OTHER): Payer: BLUE CROSS/BLUE SHIELD | Admitting: Registered Nurse

## 2008-04-11 ENCOUNTER — Encounter (HOSPITAL_BASED_OUTPATIENT_CLINIC_OR_DEPARTMENT_OTHER): Payer: PPO | Admitting: Nephrology

## 2008-04-13 ENCOUNTER — Other Ambulatory Visit (HOSPITAL_BASED_OUTPATIENT_CLINIC_OR_DEPARTMENT_OTHER): Payer: PPO

## 2008-04-18 ENCOUNTER — Ambulatory Visit (HOSPITAL_BASED_OUTPATIENT_CLINIC_OR_DEPARTMENT_OTHER): Payer: PPO | Admitting: Family Medicine

## 2008-04-18 VITALS — BP 138/76 | HR 54 | Wt 234.0 lb

## 2008-04-18 NOTE — Progress Notes (Signed)
FOB Worksheet for MA Services  Arrived:  YES  Vital(s) &/or pain assessment:  YES  Lab, chart, reports &/or meds reviewed:  YES  Medication(s)  Symptoms, diagnosis, plan, meds &/or other explained/clarified:  YES   Symptom(s)  Room use 26-50 min (excludes wait time):  NO  Room use >50 min (excludes wait time):  NO  Chaperone: NO  Other procedures (not separately billed):  NO    MA-only visit  NO    Total number of YES responses: 3  Completed by: Roxane Puerto

## 2008-04-18 NOTE — Progress Notes (Signed)
S:  Here for follow up on dyspnea and recent cardiac echo.  Inhaler--tried it once--no significant change in SOB symptoms.  Since knee surgery has not been as active--feels like he is not in great shape.  Activities more sedentary of late. Has been making a rockinghorse for his granddaughter!  No chest pain.  Still some ankle edema.    Reviewed echo results: good EF, and no significant valvular abnormalities, though small muscular VSD noted, unchanged from prior echo finding.  Reviewed labs: last done in Oct 09: urine tests basically OK, though does produced some oxalate--which is responsible for his kidney stones. N protein levels at that time.    O:  BP 138/76  Pulse 54  Wt 234 lb (106.142 kg)  Pt alert and cooperative and in NAD.  Chest: A/E + to bases bilat, though diminished.  Mild ankle edema bilat, pitting.    A/P:  1) dyspnea most likely due to emphysema and deconditioning.  Cardiac function at present seems good. Bp excellent.  Pt should try to exercise more--should gradually ramp up.   2) pt mentions that his insurance may force him to change providers. Let him know that I would be happy to provide new provider with any medical records, and I gave him a copy of his problem list that is pretty much updated for his new provider.  rtc prn.    Sherlean Foot, MD

## 2008-06-06 ENCOUNTER — Telehealth (HOSPITAL_BASED_OUTPATIENT_CLINIC_OR_DEPARTMENT_OTHER): Payer: Self-pay | Admitting: Family Medicine

## 2008-06-06 NOTE — Telephone Encounter (Signed)
Pharmacy faxed patient refill request.  Please advise, thanks.  Nifedipine ER (XL) 90 mg tab

## 2008-06-07 MED ORDER — NIFEDIPINE ER 90 MG OR TB24
EXTENDED_RELEASE_TABLET | ORAL | Status: DC
Start: 2008-06-07 — End: 2009-05-15

## 2008-08-01 ENCOUNTER — Encounter (HOSPITAL_BASED_OUTPATIENT_CLINIC_OR_DEPARTMENT_OTHER): Payer: Self-pay | Admitting: Family Medicine

## 2009-03-04 ENCOUNTER — Telehealth (HOSPITAL_BASED_OUTPATIENT_CLINIC_OR_DEPARTMENT_OTHER): Payer: Self-pay | Admitting: Family Medicine

## 2009-03-04 NOTE — Telephone Encounter (Signed)
Faxed refill request received from pharmacy 03/04/09.  Patient was last seen in our office 04/18/08.  Medication was last filled 01/03/07 (flomax). The Finasteride does not appear to have been filled here.  Please advise, thanks.

## 2009-03-08 MED ORDER — FINASTERIDE 5 MG OR TABS
ORAL_TABLET | ORAL | Status: DC
Start: 2009-03-04 — End: 2009-05-15

## 2009-03-08 MED ORDER — FLOMAX 0.4 MG OR CAPS
ORAL_CAPSULE | ORAL | Status: DC
Start: 2009-03-04 — End: 2009-05-15

## 2009-03-08 NOTE — Telephone Encounter (Signed)
Ok to refill as indicated, though I believe the patient has a new physician. ct

## 2009-05-15 ENCOUNTER — Ambulatory Visit: Payer: PRIVATE HEALTH INSURANCE | Attending: Family Medicine | Admitting: Family Medicine

## 2009-05-15 ENCOUNTER — Encounter (HOSPITAL_BASED_OUTPATIENT_CLINIC_OR_DEPARTMENT_OTHER): Payer: Self-pay | Admitting: Family Medicine

## 2009-05-15 VITALS — BP 130/62 | HR 56 | Temp 97.9°F | Ht 70.0 in | Wt 240.9 lb

## 2009-05-15 DIAGNOSIS — E7800 Pure hypercholesterolemia, unspecified: Secondary | ICD-10-CM | POA: Insufficient documentation

## 2009-05-15 DIAGNOSIS — I712 Thoracic aortic aneurysm, without rupture, unspecified: Secondary | ICD-10-CM | POA: Insufficient documentation

## 2009-05-15 DIAGNOSIS — R0602 Shortness of breath: Secondary | ICD-10-CM | POA: Insufficient documentation

## 2009-05-15 DIAGNOSIS — N400 Enlarged prostate without lower urinary tract symptoms: Secondary | ICD-10-CM | POA: Insufficient documentation

## 2009-05-15 DIAGNOSIS — Z0000 Encounter for general adult medical examination without abnormal findings: Secondary | ICD-10-CM | POA: Insufficient documentation

## 2009-05-15 DIAGNOSIS — I10 Essential (primary) hypertension: Secondary | ICD-10-CM | POA: Insufficient documentation

## 2009-05-15 DIAGNOSIS — N2589 Other disorders resulting from impaired renal tubular function: Secondary | ICD-10-CM | POA: Insufficient documentation

## 2009-05-15 MED ORDER — SIMVASTATIN 40 MG OR TABS
ORAL_TABLET | ORAL | Status: DC
Start: 2009-05-15 — End: 2010-03-03

## 2009-05-15 MED ORDER — FINASTERIDE 5 MG OR TABS
ORAL_TABLET | ORAL | Status: DC
Start: 2009-05-15 — End: 2009-09-06

## 2009-05-15 MED ORDER — NIFEDIPINE ER 90 MG OR TB24
EXTENDED_RELEASE_TABLET | ORAL | Status: DC
Start: 2009-05-15 — End: 2010-08-14

## 2009-05-15 MED ORDER — BENAZEPRIL HCL 20 MG OR TABS
ORAL_TABLET | ORAL | Status: DC
Start: 2009-05-15 — End: 2011-04-06

## 2009-05-15 MED ORDER — ALBUTEROL 90 MCG/ACT IN AERS
INHALATION_SPRAY | RESPIRATORY_TRACT | Status: DC
Start: 2009-05-15 — End: 2009-12-17

## 2009-05-15 MED ORDER — HYDROCHLOROTHIAZIDE 25 MG OR TABS
ORAL_TABLET | ORAL | Status: DC
Start: 2009-05-15 — End: 2010-12-30

## 2009-05-15 MED ORDER — ATENOLOL 50 MG OR TABS
ORAL_TABLET | ORAL | Status: DC
Start: 2009-05-15 — End: 2010-03-31

## 2009-05-15 MED ORDER — TAMSULOSIN HCL 0.4 MG OR CAPS
ORAL_CAPSULE | ORAL | Status: DC
Start: 2009-05-15 — End: 2009-08-26

## 2009-05-15 MED ORDER — IPRATROPIUM BROMIDE HFA 17 MCG/ACT IN AERS
INHALATION_SPRAY | RESPIRATORY_TRACT | Status: DC
Start: 2009-05-15 — End: 2009-12-17

## 2009-05-15 NOTE — Progress Notes (Signed)
Tracy Conway is a 75 year old male here for an annual health maintenance exam. Other concerns today include the following:     Concerned about emphysema. Still some SOB, constant cough, lots of phlegm, wheezing--comes and goes, phlegm every couple of hours. Not losing sleep. activties seem to be curtailed. SOB quite quickly. Some is psychological. Uses does not use the ventolin, but uses the atrovent . Wonders about spiriva.  Was smoker quit 10 yrs ago.  Ventolin--uses occ  + atrovent x 1 month    BP; has been great      Current dietary habits: good  Current exercise habits: active    Patient Active Problem List   Diagnoses Date Noted   ROUTINE MEDICAL EXAM [V70.0] 05/15/2009   Priority: High   Overview Note:   Td/Tdap (q 10 yr):    Pneumococcal (65 yoa and high risk x1):  2010, 2004  Influenza vaccine (q 1 yr):  2010 nov  Blood pressure:  excellent  Cholesterol (q 5 yr begin age 57):   AAA screen (x1, male age 33, if ever smoked):  Checked at [facility]  Colorectal cancer: ct colonoscopy done 2008   PSA:    Tobacco:    Exercise:    Alcohol /Drugs:    Sexual practice/contraception:    Advanced directives:         GENERAL OSTEOARTHROSIS [715.00] 01/07/2007   Priority: High   Overview Note:   Has seen orthopedics--cortisone shot to L knee helped.     DIVERTICULITIS OF COLON W/O BLEED [562.11] 01/07/2007   Priority: High   Overview Note:   Hospitalized at [facility] May 2008     TOBACCO USE DISORDER [305.1] 12/24/2005   Priority: High   Overview Note:   Quit 2000.  EtOH--quit 1986.     HEARING LOSS NOS [389.9] 12/24/2005   Priority: High   Overview Note:   Bilat hearing aids.     ECZEMA  [692.9] 12/24/2005   Priority: High   Overview Note:   Lower extremities in particular.     ERECTILE DYSFUNCTION [607.84] 12/24/2005   Priority: High      BENIGN NEOPLASM LG BOWEL [211.3] 12/24/2005   Priority: High   Overview Note:   Tubular adenoma. Dx'ed flex sig 2000--> colonoscopy 2000: N.  F/U 2003: mild  diverticulosis, tubular adenoma.  F/U due 2008 with CT colonography.--done 7/08: ess. N.  Next due 2013       CALCULUS OF KIDNEY [592.0] 12/24/2005   Priority: High   Overview Note:   Lithotripsy 1988  Stone panel in 0ct 09: high oxalate.       COPD [496] 12/24/2005   Priority: High   Overview Note:   emphysema     THORACIC AORTIC ANEURYSM [441.2] 12/24/2005   Priority: High   Overview Note:   Ascending aortic aneurysm.  Followed by Dr. Brunilda Payor, Cardiac surgery.  Gets regular CT scans.  Goal systolic BP: 100-120     ABDOM AORTIC ANEURYSM [441.4] 12/24/2005   Priority: High   Overview Note:   Borderline, seen on CT 8/06.     VENTRICULAR SEPT DEFECT [745.4] 12/24/2005   Priority: High   Overview Note:   Small muscular VSD--NEEDS SBE PROPHYLAXIS. (Clindamycin 600 mg prior to dental visits.)     HYPERTROPHY PROSTATE W/O OBST [600.00] 12/24/2005   Priority: High      Chronic Kidney Disease -- Stage III [588.89] 07/22/2005   Priority: High   Overview Note:   Creat ~  1.8. Normal albuminuria (8/08)  Likely 2/2 HTN, poss component from hx obstructing nephrolithiasis.  8/08 Renal: referred to Uro for 8mm stone, nonobstructing. Also doing w/u for stone type, poss target prevention.     HYPERTENSION NOS(aka HTN) [401.9] 03/29/2002   Priority: High      PURE HYPERCHOLESTEROLEM [272.0] 03/29/2002   Priority: High      MALIG NEOPLASM DORSAL TONGUE(aka CANCER) [141.1] 03/29/2002   Priority: High   Overview Note:   Ssurgery 1994, T1N1 squamous cell ca L tongue, partial glossectomy; L supraorbital hyoid neck dissection             Past Medical History   Diagnosis Date   . AORTIC ANEURYSM NOS      Aortic Aneurysm   . MALIG NEOPLASM DORSAL TONGUE(aka CANCER) 03/29/2002     Ssurgery 1994, T1N1 squamous cell ca L tongue, partial glossectomy; L supraorbital hyoid neck dissection    . TOBACCO USE DISORDER 12/24/2005     Quit 2000.   Marland Kitchen HEARING LOSS NOS 12/24/2005     Bilat hearing aids.   . ECZEMA  12/24/2005     Lower extremities in particular.    Marland Kitchen ERECTILE DYSFUNCTION 12/24/2005   . BENIGN NEOPLASM LG BOWEL 12/24/2005     Tubular adenoma. Dx'ed flex sig 2000--> colonoscopy 2000: N.  F/U 2003: mild diverticulosis, tubular adenoma.  F/U due 2008 with CT colonography.   . IMPAIRED RENAL FUNCT DISORDERS NEC 07/22/2005     Creat ~ 1.2   . CALCULUS OF KIDNEY 12/24/2005     Lithotripsy 1988   . 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 THORACIC AORTIC ANEURYSM 12/24/2005     Ascending aortic aneurysm.  Followed by Dr. Brunilda Payor, Cardiac surgery.  Gets regular CT scans.  Goal systolic BP: 100-120   . ABDOM AORTIC ANEURYSM 12/24/2005     Borderline, seen on CT 8/06.   Marland Kitchen VENTRICULAR SEPT DEFECT 12/24/2005     Small muscular VSD--NEEDS SBE PROPHYLAXIS. (Clindamycin 600 mg prior to dental visits.)   . HYPERTROPHY PROSTATE W/O OBST 12/24/2005   . GENERAL OSTEOARTHROSIS 01/07/2007     Has seen orthopedics--cortisone shot to L knee helped.   Marland Kitchen DIVERTICULITIS OF COLON W/O BLEED 01/07/2007     Hospitalized at [facility] May 2008         Past Surgical History   Procedure Date   . Ventral hernia repair      x3   . Removal of tongue carcinoma with lymphadenectomy    . Fusion l foot distal phalange    . Lithotrypsy of r renal calculi    . Retinaculum release b/l wrists    . Vasectomy w/tray      x2   . Vasectomy reversal          Family History   Problem Relation   . Cancer Father     Prostate   . Arthritis Father   . Other Father     Chronic Kidney Disease   . Psych Mother     Alzheimer's Disease         MEDICATIONS: See list below    History   Social History   . Marital Status: Married     Spouse Name: N/A     Number of Children: 5   . Years of Education: N/A   Occupational History   . 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 Tobacco Use: Quit  quit three years ago   . Alcohol Use: No      quit 17 years ago   . Drug Use: No   . Sexually Active: Yes -- Male partner(s)      no protection   Other Topics Concern   . Not on file   Social History Narrative   . No narrative on file            REVIEW OF SYSTEMS:    CONSTITUTIONAL: Denies, fatigue, daytime somnolence, unexpected weight loss, unexpected weight gain, fever, chills    NEUROLOGIC: Denies, headaches and tremors    OPHTHALMIC: Denies, change in vision, discharge, redness, eye pain    ENT: Denies, hearing difficulties, nasal congestion and any ear nose or throat problems    RESPIRATORY: Denies, dyspnea at rest    CARDIOVASCULAR: Denies, chest pain or pressure at rest or during exercise, palpitations, orthopnea, paroxysmal nocturnal dyspnea, peripheral edema    GI: Denies, change in appetite, dysphagia, nausea, vomiting, dyspepsia, abdominal pain, diarrhea, constipation, hematochezia, melena    GENITOURINARY: Denies, urethral discharge, dysuria    INTEGUMENTARY: Denies, skin rash, easy bruising, hair loss, changes in moles or new moles    RHEUMATOLOGIC/MSK: Denies, dry mouth, gritty eyes    ENDOCRINE: Denies, polyuria, polydipsia, heat intolerance, cold intolerance, prior blood sugar problems, prior thyroid problems    PSYCHOSOCIAL: Denies and any psychological problems    HIV RISK: Denies, multiple sexual partners, history of a sexually transmitted disease, transfusion between 1978 and 1985 and use of illicit drugs by injection      PHYSICAL EXAM:  BP 130/62  Pulse 56  Temp(Src) 97.9 F (36.6 C) (Temporal)  Ht 5\' 10"  (1.778 m)  Wt 240 lb 14.4 oz (109.272 kg)  SpO2 94%  General: healthy, alert, no distress  Evident difficulty hearing: YES  Skin: Skin color, texture, turgor normal. No rashes or concerning lesions  Head: Normocephalic. No masses, lesions, tenderness or abnormalities  Eyes: Conjunctivae/corneas clear, PERRL, EOM's intact  Ears: External ears normal. Canals clear. TM's normal.  Nose:normal  Oropharynx: Lips, mucosa, and tongue normal. Teeth and gums normal., posterior pharynx without erythema or drainage  Neck: supple. No adenopathy. Thyroid symmetric, normal size, without nodules  Lungs: A/e diminished bilat but  present to bases bilat. No crackles no wheezes  Heart: heart sounds rather distant  Abd: soft, non-tender. BS normal. No masses or organomegaly  GU: deferred  Rectal: deferred  Prostate: Deferred  Ext: Normal, without deformities, edema, or skin discoloration, radial and DP pulses 2+ bilaterally  Neuro:  Grossly normal to observation, gait normal    ASSESSMENT AND PLAN:    441.2 THORACIC AORTIC ANEURYSM  Comment: also needs repeat CT scan to assess stability  Plan: NIFEDIPINE 90 MG OR TB24            588.89 Chronic Kidney Disease -- Stage III  Comment: need to follow creatinine  Plan: LIPID PANEL, COMPREHENSIVE METABOLIC PANEL              401.9 HYPERTENSION NOS(aka HTN)  Comment: doing very well. BP's are in excellent range, and he brings in a graph of his BPs at home which will be scanned into the chart.  Plan: LIPID PANEL, COMPREHENSIVE METABOLIC PANEL,         NIFEDIPINE 90 MG OR TB24            272.0 PURE HYPERCHOLESTEROLEM  Comment: repeat labs  Plan: LIPID PANEL, CREATINE KINASE TOTAL ACTIVITY  786.05 SHORTNESS OF BREATH  Comment: pt is very concerned about this. i will refer him to Pulmonology for their help in this. He also requests that he be seen by a cardiologist. He has seen Dr. Brunilda Payor in CT surgery, but not by a cardiologist.   Plan: CBC, DIFF, REFERRAL TO CARDIOLOGY, REFERRAL TO         PULMONOLOGY, ALBUTEROL 90 MCG/ACT IN AERS              V70.0 ROUTINE MEDICAL EXAM  Plan: PROSTATE SPECIFIC ANTIGEN                      272.0 PURE HYPERCHOLESTEROLEM  Comment:   Plan: LIPID PANEL, CREATINE KINASE TOTAL ACTIVITY,         SIMVASTATIN 40 MG OR TABS              401.9 HYPERTENSION NOS  Comment: refill meds, check labs  Plan: LIPID PANEL, COMPREHENSIVE METABOLIC PANEL,         NIFEDIPINE 90 MG OR TB24, HYDROCHLOROTHIAZIDE         25 MG OR TABS, ATENOLOL 50 MG OR TABS,         BENAZEPRIL HCL 20 MG OR TABS            600.00 HYPERTROPHY PROSTATE W/O OBST  Comment: refill meds  Plan: TAMSULOSIN HCL 0.4  MG OR CAPS, FINASTERIDE 5 MG        OR TABS          rtc in ~2 months              Sherlean Foot, MD

## 2009-05-17 ENCOUNTER — Other Ambulatory Visit (HOSPITAL_BASED_OUTPATIENT_CLINIC_OR_DEPARTMENT_OTHER): Payer: Self-pay | Admitting: Family Medicine

## 2009-05-17 ENCOUNTER — Ambulatory Visit
Admit: 2009-05-17 | Discharge: 2009-05-17 | Disposition: A | Discharge status: Home / Self Care | Payer: PRIVATE HEALTH INSURANCE | Attending: Family Medicine | Admitting: Family Medicine

## 2009-05-17 DIAGNOSIS — I10 Essential (primary) hypertension: Secondary | ICD-10-CM | POA: Insufficient documentation

## 2009-05-17 DIAGNOSIS — Z0000 Encounter for general adult medical examination without abnormal findings: Secondary | ICD-10-CM | POA: Insufficient documentation

## 2009-05-17 DIAGNOSIS — N2589 Other disorders resulting from impaired renal tubular function: Secondary | ICD-10-CM | POA: Insufficient documentation

## 2009-05-17 DIAGNOSIS — E7800 Pure hypercholesterolemia, unspecified: Secondary | ICD-10-CM | POA: Insufficient documentation

## 2009-05-17 DIAGNOSIS — R0602 Shortness of breath: Secondary | ICD-10-CM | POA: Insufficient documentation

## 2009-05-17 LAB — CBC, DIFF
% Basophils: 0 % (ref 0–1)
% Eosinophils: 8 % — ABNORMAL HIGH (ref 0–7)
% Lymphocytes: 23 % (ref 19–53)
% Monocytes: 10 % (ref 5–13)
% Neutrophils: 59 % (ref 34–71)
Absolute Eosinophil Count: 0.48 10*3/uL (ref 0.00–0.50)
Absolute Lymphocyte Count: 1.34 10*3/uL (ref 1.00–4.80)
Basophils: 0.02 10*3/uL (ref 0.00–0.20)
Hematocrit: 43 % (ref 38–50)
Hemoglobin: 13.9 g/dL (ref 13.0–18.0)
MCH: 29 pg (ref 27.3–33.6)
MCHC: 32.7 g/dL (ref 32.2–36.5)
MCV: 89 fL (ref 81–98)
Monocytes: 0.59 10*3/uL (ref 0.00–0.80)
Neutrophils: 3.42 10*3/uL (ref 1.80–7.00)
Platelet Count: 200 10*3/uL (ref 150–400)
RBC: 4.8 mil/uL (ref 4.40–5.60)
RDW-CV: 13.3 % (ref 11.6–14.4)
WBC: 5.85 10*3/uL (ref 4.3–10.0)

## 2009-05-17 LAB — PSA, DIAGNOSTIC/MONITORING: PSA, Diagnostic/Monitoring: 2.26 ng/mL (ref 0.00–4.00)

## 2009-05-17 LAB — COMPREHENSIVE METABOLIC PANEL
ALT (GPT): 18 U/L (ref 10–48)
AST (GOT): 21 U/L (ref 15–40)
Albumin: 3.9 g/dL (ref 3.5–5.2)
Alkaline Phosphatase (Total): 42 U/L (ref 36–161)
Anion Gap: 9 (ref 3–11)
Bilirubin (Total): 0.9 mg/dL (ref 0.2–1.3)
Calcium: 9.3 mg/dL (ref 8.9–10.2)
Carbon Dioxide, Total: 26 mEq/L (ref 22–32)
Chloride: 105 mEq/L (ref 98–108)
Creatinine: 1.7 mg/dL — ABNORMAL HIGH (ref 0.2–1.1)
GFR, Calc, African American: 48 mL/min — ABNORMAL LOW (ref >59)
GFR, Calc, European American: 40 mL/min — ABNORMAL LOW (ref >59)
Glucose: 118 mg/dL (ref 62–125)
Potassium: 4.3 mEq/L (ref 3.7–5.2)
Protein (Total): 6.6 g/dL (ref 6.0–8.2)
Sodium: 140 mEq/L (ref 136–145)
Urea Nitrogen: 28 mg/dL — ABNORMAL HIGH (ref 8–21)

## 2009-05-17 LAB — LIPID PANEL
Cholesterol (LDL): 70 mg/dL (ref <130)
Cholesterol/HDL Ratio: 3.4
HDL Cholesterol: 40 mg/dL — ABNORMAL LOW (ref >40)
Non-HDL Cholesterol: 96 mg/dL (ref 0–159)
Total Cholesterol: 136 mg/dL (ref <200)
Triglyceride: 130 mg/dL (ref <150)

## 2009-05-17 LAB — CK, CREATINE KINASE, TOTAL ACTIVITY: Creatinine Kinase Total Activity: 109 U/L (ref 30–285)

## 2009-05-21 ENCOUNTER — Encounter (HOSPITAL_BASED_OUTPATIENT_CLINIC_OR_DEPARTMENT_OTHER): Payer: Self-pay | Admitting: Family Medicine

## 2009-05-24 ENCOUNTER — Ambulatory Visit: Payer: PRIVATE HEALTH INSURANCE | Attending: Family Medicine

## 2009-05-24 DIAGNOSIS — I712 Thoracic aortic aneurysm, without rupture, unspecified: Secondary | ICD-10-CM | POA: Insufficient documentation

## 2009-05-24 NOTE — Progress Notes (Addendum)
Addended by: Tana Coast on: 05/24/2009      Modules accepted: Orders, Medications

## 2009-05-25 LAB — PR CT ANGIOGRAPHY CHEST W/CONTRAST/NONCONTRAST

## 2009-05-27 ENCOUNTER — Encounter (HOSPITAL_BASED_OUTPATIENT_CLINIC_OR_DEPARTMENT_OTHER): Payer: Self-pay | Admitting: Family Medicine

## 2009-06-13 ENCOUNTER — Ambulatory Visit: Payer: PRIVATE HEALTH INSURANCE | Attending: Cardiovascular Disease | Admitting: Cardiovascular Disease

## 2009-06-13 DIAGNOSIS — E785 Hyperlipidemia, unspecified: Secondary | ICD-10-CM | POA: Insufficient documentation

## 2009-06-13 DIAGNOSIS — I719 Aortic aneurysm of unspecified site, without rupture: Secondary | ICD-10-CM | POA: Insufficient documentation

## 2009-06-13 DIAGNOSIS — J4489 Other specified chronic obstructive pulmonary disease: Secondary | ICD-10-CM | POA: Insufficient documentation

## 2009-06-13 DIAGNOSIS — I10 Essential (primary) hypertension: Secondary | ICD-10-CM | POA: Insufficient documentation

## 2009-06-13 DIAGNOSIS — R0989 Other specified symptoms and signs involving the circulatory and respiratory systems: Secondary | ICD-10-CM | POA: Insufficient documentation

## 2009-06-19 ENCOUNTER — Ambulatory Visit: Payer: PRIVATE HEALTH INSURANCE | Attending: Pulmonary Disease | Admitting: Pulmonary Disease

## 2009-06-19 DIAGNOSIS — R059 Cough, unspecified: Secondary | ICD-10-CM | POA: Insufficient documentation

## 2009-06-19 DIAGNOSIS — J4489 Other specified chronic obstructive pulmonary disease: Secondary | ICD-10-CM | POA: Insufficient documentation

## 2009-08-15 ENCOUNTER — Ambulatory Visit: Payer: PRIVATE HEALTH INSURANCE | Attending: Family Medicine | Admitting: Family Medicine

## 2009-08-15 VITALS — BP 110/62 | HR 64 | Temp 97.5°F | Wt 241.4 lb

## 2009-08-15 DIAGNOSIS — Z96659 Presence of unspecified artificial knee joint: Secondary | ICD-10-CM | POA: Insufficient documentation

## 2009-08-15 DIAGNOSIS — S8990XA Unspecified injury of unspecified lower leg, initial encounter: Secondary | ICD-10-CM | POA: Insufficient documentation

## 2009-08-15 DIAGNOSIS — M25569 Pain in unspecified knee: Secondary | ICD-10-CM | POA: Insufficient documentation

## 2009-08-15 DIAGNOSIS — S99919A Unspecified injury of unspecified ankle, initial encounter: Secondary | ICD-10-CM | POA: Insufficient documentation

## 2009-08-15 NOTE — Progress Notes (Signed)
-------------------------------------------    Attending: Doristine Section, MD    I discussed the patient with the resident and agree with the findings, assessment and plan we discussed.    Key findings based upon this discussion:  Contusion knee refer to PT Musculoskeletal Back Pain refer to PT.  -------------------------------------------

## 2009-08-15 NOTE — Patient Instructions (Signed)
ICE your knee and hip for 20 minutes 4 times a day as needed for pain.  You may take tylenol as directed on the bottle for pain.  Work with your physical therapist on knee and hip stretching and strengthening once you have heard that the x rays were OK.  Slowly advance your activities as your pain allows.  Call our clinic if things are getting worse or you notice new problems like fever, increasing pain, giving way, numbness, weakness, or tingling.

## 2009-08-15 NOTE — Progress Notes (Signed)
75 yo man here for eval of L knee and R hip    Was in [place] visiting relatives 2 weeks ago  Was going into house -- stepped on threshold -- caught shoe and fell directly on L knee on tile floor.  Within 30 min got swollen on kneecap -- like baseball.  Did ice that night and next am.  Swelling went down but still not completley gone.  Has click in artificial joint on L knee which he fell (suergery 2 years).  Hurt over front of knee and even now if he puts pressure on if he hurts or a bit of walking.  Some warmth before but no redness or other.  Could bear weight gingerly next day.  No pop/snap/cruch with fall. No catching, locking, giving way.  No numbness, weakness, tingling.  Getting better but still swollen with black/blue.  Took some APAP to help.  Now walking worse exacerbation -- extended.    R HIP:  Hurts constantly located (point to back of R buttock) with pain radiating down back of leg.  Showed up a few days after fall.  No known direct injury.  Dull constant pain.  Like bone headache.  Better with standing and still.  Sitting or lying on that side makes sore.  Some stretching helps.  Sleeps on other side.  No assoc sx like swelling, numbnes, weakness, tingling, catching, locking, redness.  No problems with this hip before.  No back pain.    Patient Active Problem List   Diagnoses Date Noted   HYPERTROPHY PROSTATE W/O OBST [600.00] 05/24/2009   Priority: High      ROUTINE MEDICAL EXAM [V70.0] 05/15/2009   Priority: High   Overview Note:   Td/Tdap (q 10 yr):    Pneumococcal (65 yoa and high risk x1):  2010, 2004  Influenza vaccine (q 1 yr):  2010 nov  Blood pressure:  excellent  Cholesterol (q 5 yr begin age 37):   AAA screen (x1, male age 83, if ever smoked):  Checked at [facility]  Colorectal cancer: ct colonoscopy done 2008   PSA:    Tobacco:    Exercise:    Alcohol /Drugs:    Sexual practice/contraception:    Advanced directives:         GENERAL OSTEOARTHROSIS [715.00] 01/07/2007      Priority: High   Overview Note:   Has seen orthopedics--cortisone shot to L knee helped.     DIVERTICULITIS OF COLON W/O BLEED [562.11] 01/07/2007   Priority: High   Overview Note:   Hospitalized at [facility] May 2008     TOBACCO USE DISORDER [305.1] 12/24/2005   Priority: High   Overview Note:   Quit 2000.  EtOH--quit 1986.     HEARING LOSS NOS [389.9] 12/24/2005   Priority: High   Overview Note:   Bilat hearing aids.     ECZEMA  [692.9] 12/24/2005   Priority: High   Overview Note:   Lower extremities in particular.     ERECTILE DYSFUNCTION [607.84] 12/24/2005   Priority: High      BENIGN NEOPLASM LG BOWEL [211.3] 12/24/2005   Priority: High   Overview Note:   Tubular adenoma. Dx'ed flex sig 2000--> colonoscopy 2000: N.  F/U 2003: mild diverticulosis, tubular adenoma.  F/U due 2008 with CT colonography.--done 7/08: ess. N.  Next due 2013       CALCULUS OF KIDNEY [592.0] 12/24/2005   Priority: High   Overview Note:   Lithotripsy 1988  Stone  panel in 0ct 09: high oxalate.       COPD [496] 12/24/2005   Priority: High   Overview Note:   emphysema     THORACIC AORTIC ANEURYSM [441.2] 12/24/2005   Priority: High   Overview Note:   Ascending aortic aneurysm.  Followed by Dr. Brunilda Payor, Cardiac surgery.  Gets regular CT scans.  Goal systolic BP: 100-120     ABDOM AORTIC ANEURYSM [441.4] 12/24/2005   Priority: High   Overview Note:   Borderline, seen on CT 8/06.     VENTRICULAR SEPT DEFECT [745.4] 12/24/2005   Priority: High   Overview Note:   Small muscular VSD--NEEDS SBE PROPHYLAXIS. (Clindamycin 600 mg prior to dental visits.)     HYPERTROPHY PROSTATE W/O OBST [600.00] 12/24/2005   Priority: High      Chronic Kidney Disease -- Stage III [588.89] 07/22/2005   Priority: High   Overview Note:   Creat ~ 1.8. Normal albuminuria (8/08)  Likely 2/2 HTN, poss component from hx obstructing nephrolithiasis.  8/08 Renal: referred to Uro for 8mm stone, nonobstructing. Also doing w/u for stone type, poss target prevention.     HYPERTENSION  NOS(aka HTN) [401.9] 03/29/2002   Priority: High      PURE HYPERCHOLESTEROLEM [272.0] 03/29/2002   Priority: High      MALIG NEOPLASM DORSAL TONGUE(aka CANCER) [141.1] 03/29/2002   Priority: High   Overview Note:   Ssurgery 1994, T1N1 squamous cell ca L tongue, partial glossectomy; L supraorbital hyoid neck dissection           Past Surgical History   Procedure Date   . Ventral hernia repair      x3   . Removal of tongue carcinoma with lymphadenectomy    . Fusion l foot distal phalange    . Lithotrypsy of r renal calculi    . Retinaculum release b/l wrists    . Vasectomy w/tray      x2   . Vasectomy reversal        O:  BP 110/62  Pulse 64  Temp(Src) 97.5 F (36.4 C) (Temporal)  Wt 241 lb 6.4 oz (109.498 kg)  General: healthy, alert, no distress  Knee exam (focus on L knee):  -- Inspection: anterior effusion notable --no frank intraarticular effusion present.  Significant bruising anterior an anterolateralknee.  -- Gait: WNL. Non antalgic. No genu recurvatum. Normal stance and stride phases, although slightly tenative  -- ROM: ROM from 0 to 130 degrees with knee flexion and extension  -- Strength: 5/5 strength with knee flexion and extension  -- Special tests: Anterior drawer feels normal for TKA. Posterior drawer normal for TKA. Lachman's maneuver normal for TKA. Varus and valgus stress testing at 0 and 30 degrees is normal for TKA . Patellar grind painful. Single leg squats ~20-30 deg B/L with some discomfort an instability -- symmetric. Ober's test normal,  -- Palpation: Effusion is not obvious and obscured by anterior bruising and swelling. Patellar facets tender. Palpation along the patellar tendon is non tender. There is no joint line tenderness but there is TTP on anterior tibia more so on lateral aspect  Hip exam -- focus on Right  -- Gait: Nonantalgic, no gluteus medius lurch  -- Inspection: no deformity, no atrophy, no asymmetry, no pelvic obliquity  -- ROM: 10 degrees of IR, 40 degrees of ER,  80  degrees of flexion (knee extended), 100 degrees of flexion (knee flexed).  Pain illicted with hip flexion with knee flexed but no other maneuvers  --  Strength: 5/5 with hip flexion (knee extended), 5/5 with hip adduction, 5/5 with hip abduction (lateral decub position), hip flexion 5/5  -- Special tests: see knee exam, SLR normal on both sides    Sensation and strength intact to B extremeties  Circulation intact to pulses and cap refill distally    A/P:  KNEE:  Suspect contusion based on MOI and ability to bear weight after with anterior knee swelling and bruising without sx of instability.  However, given post surgical state and exam limitations based on extensive bruising and swelling anterior, will get xray to look for small fracture, hardware disruption or evidence of intraarticular effusion.  --X rays L knee  --call with results  --IF xray normal and reassuring, patient given referral to take to existing PT to consider knee stretching and stabilization routine suitable for post TKA patients.  --APAP, ice PRN pain    R HIP:  Exam and history c/w mucle strain secondary to altered gait due to knee pain.  No R sided knee pathology present to suggest referred.  No sings of radiculopathy although history somewhat concerning.  --ICE., APAP PRN  --referreal to PT with precautions re imperfect balance multifactorial many medical conditoins to work on strethcing (has tight hamstrings) and pelvic stablization    Strict return precautions given for both knee and hip.  May need ortho eval if does not continue to improve as expected or x ray abnormalities seen.    Due for colo 2013 (CT colo in 2008)

## 2009-08-16 LAB — PR X-RAY KNEE 3 VIEW

## 2009-08-19 NOTE — Progress Notes (Addendum)
---------------------------------------------    I have reviewed the resident's note and agree with the findings, assessment and plan documented in that note.  Attending: Maxmilian Trostel K Katrinna Travieso, MD  ---------------------------------------------

## 2009-08-22 ENCOUNTER — Telehealth (HOSPITAL_BASED_OUTPATIENT_CLINIC_OR_DEPARTMENT_OTHER): Payer: Self-pay | Admitting: Family Medicine

## 2009-08-22 NOTE — Telephone Encounter (Signed)
Called patient with knee xray results.  He had reviewed in [place] as well.  Explained that results normal except soft tissue swelling and silght effusion.  Patient reports he is getting better every day still.  Still with some swelling/bruising over front of knee.  Walking, moving, doing ADLs without problems.  Kneeling still painful though.  No knee instability.    Plan: safe to do PT to improve knee strength, flexibility, proprioception.    Call or return to clinic if sx get worse, has loss of ROM, instability or other concerns.  OTW expect gradual return to baseline as contusion resolves.    Glory Buff, MD  PGY2 Family Medicine Resident

## 2009-08-26 ENCOUNTER — Telehealth (HOSPITAL_BASED_OUTPATIENT_CLINIC_OR_DEPARTMENT_OTHER): Payer: Self-pay | Admitting: Family Medicine

## 2009-08-26 NOTE — Telephone Encounter (Signed)
Faxed received from the pharmacy on 08/26/2009   Patient was last seen on 08/15/2009  Prescription was last filled on 05/15/2009    Please advise

## 2009-08-27 ENCOUNTER — Encounter (HOSPITAL_BASED_OUTPATIENT_CLINIC_OR_DEPARTMENT_OTHER): Payer: Self-pay | Admitting: Family Medicine

## 2009-08-28 MED ORDER — TAMSULOSIN HCL 0.4 MG OR CAPS
ORAL_CAPSULE | ORAL | Status: DC
Start: 2009-08-26 — End: 2010-12-11

## 2009-08-28 NOTE — Telephone Encounter (Signed)
Second request for refill

## 2009-08-28 NOTE — Telephone Encounter (Signed)
Ok to refill. ct

## 2009-09-03 ENCOUNTER — Encounter (HOSPITAL_BASED_OUTPATIENT_CLINIC_OR_DEPARTMENT_OTHER): Payer: Self-pay | Admitting: Family Medicine

## 2009-09-06 ENCOUNTER — Telehealth (HOSPITAL_BASED_OUTPATIENT_CLINIC_OR_DEPARTMENT_OTHER): Payer: Self-pay | Admitting: Family Medicine

## 2009-09-06 MED ORDER — FINASTERIDE 5 MG OR TABS
ORAL_TABLET | ORAL | Status: DC
Start: 2009-09-06 — End: 2010-11-20

## 2009-09-06 NOTE — Telephone Encounter (Signed)
Faxed refill request received from pharmacy 09/05/09.  Patient was last seen in our office 08/15/09.  Medication was last filled 05/15/09.  Please advise.    Thank you.

## 2009-09-06 NOTE — Telephone Encounter (Signed)
Ok to refill. ct

## 2009-09-09 ENCOUNTER — Encounter (HOSPITAL_BASED_OUTPATIENT_CLINIC_OR_DEPARTMENT_OTHER): Payer: Self-pay | Admitting: Family Medicine

## 2009-09-17 ENCOUNTER — Encounter (HOSPITAL_BASED_OUTPATIENT_CLINIC_OR_DEPARTMENT_OTHER): Payer: Medicare Other | Admitting: Pulmonary Disease

## 2009-09-18 ENCOUNTER — Ambulatory Visit: Payer: PRIVATE HEALTH INSURANCE | Attending: Pulmonary Disease | Admitting: Internal Medicine

## 2009-09-18 DIAGNOSIS — Z87891 Personal history of nicotine dependence: Secondary | ICD-10-CM | POA: Insufficient documentation

## 2009-09-18 DIAGNOSIS — J438 Other emphysema: Secondary | ICD-10-CM | POA: Insufficient documentation

## 2009-11-05 ENCOUNTER — Telehealth (HOSPITAL_BASED_OUTPATIENT_CLINIC_OR_DEPARTMENT_OTHER): Payer: Self-pay | Admitting: Family Medicine

## 2009-11-05 NOTE — Telephone Encounter (Signed)
Received email message from pt that he is having more back pain and wishes to be seen by sports med. Will place referral. Ct    Hello PSRs--can you help get Tracy Conway scheduled? Thanks! ct

## 2009-11-06 NOTE — Telephone Encounter (Signed)
Spoke to pt who would like to go to [facility], not see our sports medicine specialist.  The referral has been closed.  Pls enter a referral to [facility] or adv

## 2009-11-07 NOTE — Telephone Encounter (Signed)
Ok will refer to [facility]...  Thanks tristen.  ct

## 2009-11-07 NOTE — Telephone Encounter (Signed)
PT adv [facility] will call him to schedule

## 2009-11-19 ENCOUNTER — Ambulatory Visit
Payer: PRIVATE HEALTH INSURANCE | Attending: Physical Medicine & Rehabilitation | Admitting: Physical Medicine & Rehabilitation

## 2009-11-19 ENCOUNTER — Encounter (HOSPITAL_BASED_OUTPATIENT_CLINIC_OR_DEPARTMENT_OTHER): Payer: Self-pay | Admitting: Physical Medicine & Rehabilitation

## 2009-11-19 VITALS — BP 115/60 | HR 60 | Ht 71.25 in | Wt 235.1 lb

## 2009-11-19 DIAGNOSIS — M545 Low back pain, unspecified: Secondary | ICD-10-CM | POA: Insufficient documentation

## 2009-11-20 NOTE — Progress Notes (Signed)
Tracy Conway is a 75 year old man referred to the [facility] by Dr. Sherlean Foot for evaluation of low back pain. He reports that he developed low back pain while participating in a rehabilitation program for his emphysema during the early part of 2011. Although he completed the program in mid June, his symptoms have persisted.    He reports pain off center to the right at the lumbosacral junction and some discomfort in the right groin. Sometimes he gets discomfort off center to the left at the lumbosacral junction. He denies any pain, sensory loss in the lower extremities, except for occasional numbness over the lateral aspect of the right thigh.  No focal motor loss in the lower extremities. He rates the current severity of his pain at 7-8/10.    He reports that his pain is aggravated by walking. Sitting does not produce any pain. He is able to manage his shoes and socks without difficulty. He gets occasional nocturnal pain. He reports that he gives working out on a regular basis. He goes to a gym 3 times per week. On off days, he walks about three fourths of a mile.    Past medical history: The patient has history of right shoulder problems. We did not review these in detail.He has a thoracic aortic aneurysm. He underwent a CTA study of the aorta with IV contrast on May 24, 2009. The root of the aorta was measured at 4.1x3.7 cm. He did ascending dimensions were 4.3 cm x4.2 cm. The arch dimensions were 3.0 cm x2.8 cm. The proximal ascending dimensions were 3.0 cm x2.9 cm. The distal descending aorta was 2.8 cm x2.9 cm. No significant changes were noted compared to an earlier study performed in 2008. Other general medical problems include prostatic hypertrophy, hypertension, COPD, hypercholesteremia, and impaired renal function. The patient's medications include nifedipine, hydrochlorothiazide, atenolol, enalapril, Spiriva, tamsulosin, finasteride, and simvastatin. He reports an allergy to  penicillin. He smoked until 2000. He does not drink alcohol. Review of systems is positive or and need for glasses, hearing loss, ringing in the ears, high blood pressure, shortness of breath, lung disease, arthritis, and prostate problems.    Family history is positive for alcohol or drug abuse, arthritis, cancer, and heart disease.    Social history: The patient is married. 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    Examination: the patient is 5 feet 11 inches in height. His weight is 235 pounds. Inspection reveals a healthy-appearing somewhat overweight man with fairly good posture. His gait is normal and he does not describe any pain during a short walk. There is no soft tissue hypersensitivity in the lumbosacral region. The patient demonstrates good flexion of the lumbar spine. Extension is moderately restricted, and produces mild midline pain in the lumbosacral region. Left lateral bending reproduces the patient's right lumbosacral pain. Right lateral bending is tolerated well. The patient is able to balance on either leg without difficulty he struggles to do a single set up. He does not report any pain. Straight leg raising is negative bilaterally. Passive range of motion of the right hip is normal, and does not provoke any symptoms. Anterior sacroiliac compression test is negative. Inspection of the distal lower extremities does not reveal evidence of her arthroplasty disease. Deep tendon reflexes are symmetric in the lower extremities. Light touch sensation is intact. Strength is 5 over 5 in all muscle groups tested in    Assessment and plan: #1 the  patient presents with axial spinal pain. As is often the case, it is difficult to identify upper sites pain generator. Some of his symptoms suggestive that he may have irritation of the right sacroiliac joint. #2 the patient is an excellent candidate for a spine rehabilitation program,and have referred him to PT.Marland Kitchen However  some caution will be needed because of his  aortic aneurysm. After he left the bone and joint center, I tried to get information about appropriate cautions from the vascular surgery service. I was told that the patient would need to be seen in clinic in order for them to render any opinion. I subsequently talked with Dr. Burgess Estelle.  We agree that a consultation with vascular surgery would be helpful to identify appropriate activity limitations for Tracy Conway, based on his aneurysm.  I later called the patient, and made it clear that he should not do situps or other exercises that are likely to increase intra-abdominal pressure until he has been evaluated by vascular surgery.

## 2009-12-04 ENCOUNTER — Ambulatory Visit: Payer: PRIVATE HEALTH INSURANCE | Attending: Internal Medicine | Admitting: Internal Medicine

## 2009-12-04 DIAGNOSIS — J4489 Other specified chronic obstructive pulmonary disease: Secondary | ICD-10-CM | POA: Insufficient documentation

## 2009-12-17 ENCOUNTER — Encounter (HOSPITAL_BASED_OUTPATIENT_CLINIC_OR_DEPARTMENT_OTHER): Payer: Self-pay | Admitting: Physical Medicine & Rehabilitation

## 2009-12-17 ENCOUNTER — Ambulatory Visit
Payer: PRIVATE HEALTH INSURANCE | Attending: Physical Medicine & Rehabilitation | Admitting: Physical Medicine & Rehabilitation

## 2009-12-17 VITALS — BP 101/58 | HR 59

## 2009-12-17 DIAGNOSIS — M545 Low back pain, unspecified: Secondary | ICD-10-CM | POA: Insufficient documentation

## 2009-12-19 NOTE — Progress Notes (Signed)
The patient returns for his first followup visit. He is getting physical therapy, and reports that his low back pain is much better than when I initially saw him on July 26. He is pleased because he's been able to get up to 22 minutes on a treadmill. In the past, he has been severely limited by emphysema. He reports that he will be seen in vascular surgery clinic in early September.    Exam: The patient's gait is normal. He demonstrates good range of motion of the lumbar spine in all planes.    Assessment and plan: #1 the patient is doing well overall. He understands that he needs to avoid exercises that will increased intra-abdominal pressure, pending evaluation and vascular surgery clinic. #2 I did show him an exercise strengthen the lumbar paraspinal muscles. Also, he reports that his physical therapist as him doing the "dead bug" exercise. This is appropriate. #3 I will see him back after he is evaluated in the and vascular surgery.

## 2009-12-26 ENCOUNTER — Ambulatory Visit: Payer: PRIVATE HEALTH INSURANCE | Attending: Cardiovascular Disease | Admitting: Cardiovascular Disease

## 2009-12-26 DIAGNOSIS — I712 Thoracic aortic aneurysm, without rupture, unspecified: Secondary | ICD-10-CM | POA: Insufficient documentation

## 2009-12-26 DIAGNOSIS — E785 Hyperlipidemia, unspecified: Secondary | ICD-10-CM | POA: Insufficient documentation

## 2009-12-26 DIAGNOSIS — N19 Unspecified kidney failure: Secondary | ICD-10-CM | POA: Insufficient documentation

## 2009-12-26 DIAGNOSIS — I10 Essential (primary) hypertension: Secondary | ICD-10-CM | POA: Insufficient documentation

## 2009-12-26 DIAGNOSIS — J4489 Other specified chronic obstructive pulmonary disease: Secondary | ICD-10-CM | POA: Insufficient documentation

## 2010-01-07 ENCOUNTER — Encounter (HOSPITAL_BASED_OUTPATIENT_CLINIC_OR_DEPARTMENT_OTHER): Payer: PRIVATE HEALTH INSURANCE | Admitting: Physical Medicine & Rehabilitation

## 2010-01-09 ENCOUNTER — Encounter (HOSPITAL_BASED_OUTPATIENT_CLINIC_OR_DEPARTMENT_OTHER): Payer: Self-pay | Admitting: Physical Medicine & Rehabilitation

## 2010-01-09 NOTE — Progress Notes (Signed)
[facility] Initial eval dated 8.12 signed and faxed 9.13 sent to [facility] 9.14

## 2010-01-10 ENCOUNTER — Ambulatory Visit: Payer: PRIVATE HEALTH INSURANCE | Attending: Cardiovascular Disease

## 2010-01-10 ENCOUNTER — Other Ambulatory Visit (HOSPITAL_BASED_OUTPATIENT_CLINIC_OR_DEPARTMENT_OTHER): Payer: Self-pay | Admitting: Cardiovascular Disease

## 2010-01-10 DIAGNOSIS — I658 Occlusion and stenosis of other precerebral arteries: Secondary | ICD-10-CM | POA: Insufficient documentation

## 2010-01-14 ENCOUNTER — Encounter (HOSPITAL_BASED_OUTPATIENT_CLINIC_OR_DEPARTMENT_OTHER): Payer: PRIVATE HEALTH INSURANCE | Admitting: Physical Medicine & Rehabilitation

## 2010-01-17 LAB — PR US CAROTID DOPPLER

## 2010-01-21 ENCOUNTER — Telehealth (HOSPITAL_BASED_OUTPATIENT_CLINIC_OR_DEPARTMENT_OTHER): Payer: Self-pay | Admitting: Physical Medicine & Rehabilitation

## 2010-01-21 NOTE — Telephone Encounter (Signed)
Message copied by Yolanda Manges on Tue Jan 21, 2010  9:54 AM  ------       Message from: Sammie Bench A       Created: Tue Dec 31, 2009 11:47 AM       Contact: [facility]         RE: NEEDS SIGNATURE/PHYSICAL THERAPY              [facility] CALL[facility], NEEDS SIGNED FAX (INITIAL EVAL REPORT) SENT BACK TO THEM AT [phone]              [facility], PHONE [phone]

## 2010-01-21 NOTE — Telephone Encounter (Signed)
This was faxed over 01/07/10 to [phone]. Will fax again today. Yolanda Manges, 01/21/2010 9:55 AM

## 2010-02-26 ENCOUNTER — Ambulatory Visit: Payer: PRIVATE HEALTH INSURANCE | Attending: Internal Medicine | Admitting: Internal Medicine

## 2010-02-26 DIAGNOSIS — J4489 Other specified chronic obstructive pulmonary disease: Secondary | ICD-10-CM | POA: Insufficient documentation

## 2010-03-03 ENCOUNTER — Other Ambulatory Visit (HOSPITAL_BASED_OUTPATIENT_CLINIC_OR_DEPARTMENT_OTHER): Payer: Self-pay | Admitting: Family Medicine

## 2010-03-03 MED ORDER — SIMVASTATIN 40 MG OR TABS
ORAL_TABLET | ORAL | Status: DC
Start: 2010-03-03 — End: 2010-05-20

## 2010-03-03 NOTE — Telephone Encounter (Signed)
CONFIRMED PHONE NUMBER: [phone]  CALLERS FIRST AND LAST NAME: Kendal Hymen - Pharmacist  FACILITY NAME: [facility] TITLE: Pharmacy  CALLERS RELATIONSHIP:OTHER: Pharmacy  RETURN CALL: Detailed message on voicemail only    SUBJECT: Prescription Management   REASON FOR REQUEST: Medication/refill question    MEDICATION: Simvastatin  CONCERN/QUESTION: Received script from Dr Burgess Estelle but not clear if it is a refill request or not?  PRESCRIBING PROVIDER: Tanner  DOSE TAKING NOW: 40mg   PHARMACY NAME, LOCATION, & PHONE #: [facility], [phone] (Doctor line)      Thank you!

## 2010-03-28 ENCOUNTER — Other Ambulatory Visit (HOSPITAL_BASED_OUTPATIENT_CLINIC_OR_DEPARTMENT_OTHER): Payer: Self-pay | Admitting: Family Medicine

## 2010-03-31 ENCOUNTER — Other Ambulatory Visit (HOSPITAL_BASED_OUTPATIENT_CLINIC_OR_DEPARTMENT_OTHER): Payer: Self-pay | Admitting: Family Medicine

## 2010-04-01 MED ORDER — ATENOLOL 50 MG OR TABS
ORAL_TABLET | ORAL | Status: DC
Start: 2010-03-31 — End: 2010-12-30

## 2010-04-01 NOTE — Telephone Encounter (Signed)
Looks like this is a duplicate. ct

## 2010-04-01 NOTE — Telephone Encounter (Signed)
Ok to refill. ct

## 2010-04-26 ENCOUNTER — Encounter (HOSPITAL_BASED_OUTPATIENT_CLINIC_OR_DEPARTMENT_OTHER): Payer: Self-pay | Admitting: Family Medicine

## 2010-05-02 ENCOUNTER — Telehealth (HOSPITAL_BASED_OUTPATIENT_CLINIC_OR_DEPARTMENT_OTHER): Payer: Self-pay | Admitting: Family Medicine

## 2010-05-02 NOTE — Telephone Encounter (Addendum)
CONFIRMED PHONE NUMBER: [phone]  CALLERS FIRST AND LAST NAME: Lisbeth Ply  FACILITY NAME: n/a TITLE: n/a  CALLERS RELATIONSHIP:Self  RETURN CALL: Detailed message on voicemail only    SUBJECT: General Message   REASON FOR REQUEST: Patient requesting Labs be done before the appointment so that he and the Dr are able to discuss the results during the appointment on 05/20/2010 at 10:15 am for the annual    MESSAGE: Please contact patient to advise him that the above is able to be done   FORWARD TO: Dr. Burgess Estelle

## 2010-05-05 ENCOUNTER — Other Ambulatory Visit (HOSPITAL_BASED_OUTPATIENT_CLINIC_OR_DEPARTMENT_OTHER): Payer: Self-pay | Admitting: Family Medicine

## 2010-05-05 ENCOUNTER — Ambulatory Visit: Payer: PRIVATE HEALTH INSURANCE | Attending: Family Medicine | Admitting: Family Medicine

## 2010-05-05 VITALS — BP 118/68 | HR 61 | Wt 239.2 lb

## 2010-05-05 DIAGNOSIS — M25519 Pain in unspecified shoulder: Secondary | ICD-10-CM | POA: Insufficient documentation

## 2010-05-05 MED ORDER — HYDROCODONE-ACETAMINOPHEN 5-500 MG OR CAPS
ORAL_CAPSULE | ORAL | Status: AC
Start: 2010-05-05 — End: 2010-05-14

## 2010-05-05 NOTE — Progress Notes (Signed)
-------------------------------------------    Attending: Amie Portland, MD    I discussed the patient with the resident and agree with the findings, assessment and plan we discussed.    Key findings based upon this discussion:  Probable rotator cuff tear.  -------------------------------------------

## 2010-05-05 NOTE — Progress Notes (Signed)
SUBJECTIVE>    History of Present Illness:   Tracy Conway is a 76 year old male who presents for discussion/evaluation of:   1. R shoulder pain: Chronic for several years has done PT, etc.  3 weeks ago had injury - sharp pain in top of shoulder while trying to lift arm, hit hand on faucet.  Pain has been constant since. No fevers.      Pred burst about 2 months ago for emphysema, otherwise no new meds.      Review of Systems:  Largely as above  No fever, chills, nausea, vomiting, diarrhea, changes in weight, chest pain, SOB, numbness, tingling, weakness, or loss of consciousness.        OBJECTIVE>    BP 118/68  Pulse 61  Wt 239 lb 3.2 oz (108.5 kg)    General: healthy, alert, no distress  Shoulder Exam (Right)  -- Inspection: No deformity, No asymmetry, No atrophy. Scapulothoracic motion reveals no winging.  -- ROM: Abduction to  180 degrees. Cross Body Adduction  to  160 degrees. Forward flexion  to  180 degrees. IR  to the iliac crest level bilaterally. ER  to  30 degrees.  -- Strength: 3/5 with abduction.  -- Special tests: Jobe's test is positive for pain and weakness on the R markedly greater than L.  O'Brien's test negative.  Lift off test negative.   -- Palpation:: No tenderness at the glenohumeral,or sternoclavicular joints. + TTP over [facility] joint. Tenderness is not at the bicipital groove. no trigger point tenderness to palpation.  -- Neurovascular:  Intact with regard to sensation and distal pulses (radial).      ASSESSMENT AND PLAN>  Tracy Conway is a 76 year old male with right shoulder pain     1.  SHOULDER PAIN: Suspect rotator cuff pathology given history (mech of injury) and PE findings  - Will check plain films to r/o fracture, cuff arthropathy, etc.  - PT referral   - Likely will need MRI pending improvement from PT - says that he would be interested in surgery. Will discuss with radiology the need for intra-articular contrast and its safety in the setting of CKD.        Dennison Nancy, MD

## 2010-05-06 ENCOUNTER — Other Ambulatory Visit (HOSPITAL_BASED_OUTPATIENT_CLINIC_OR_DEPARTMENT_OTHER): Payer: Self-pay | Admitting: Family Medicine

## 2010-05-06 ENCOUNTER — Ambulatory Visit
Admit: 2010-05-06 | Discharge: 2010-05-06 | Disposition: A | Discharge status: Home / Self Care | Payer: PRIVATE HEALTH INSURANCE | Attending: Family Medicine | Admitting: Family Medicine

## 2010-05-06 DIAGNOSIS — E782 Mixed hyperlipidemia: Secondary | ICD-10-CM | POA: Insufficient documentation

## 2010-05-06 DIAGNOSIS — M19019 Primary osteoarthritis, unspecified shoulder: Secondary | ICD-10-CM | POA: Insufficient documentation

## 2010-05-06 DIAGNOSIS — M25519 Pain in unspecified shoulder: Secondary | ICD-10-CM | POA: Insufficient documentation

## 2010-05-06 DIAGNOSIS — N400 Enlarged prostate without lower urinary tract symptoms: Secondary | ICD-10-CM | POA: Insufficient documentation

## 2010-05-06 DIAGNOSIS — I10 Essential (primary) hypertension: Secondary | ICD-10-CM | POA: Insufficient documentation

## 2010-05-06 LAB — COMPREHENSIVE METABOLIC PANEL
ALT (GPT): 19 U/L (ref 10–48)
AST (GOT): 24 U/L (ref 15–40)
Albumin: 3.8 g/dL (ref 3.5–5.2)
Alkaline Phosphatase (Total): 36 U/L (ref 36–161)
Anion Gap: 6 (ref 3–11)
Bilirubin (Total): 0.7 mg/dL (ref 0.2–1.3)
Calcium: 9.6 mg/dL (ref 8.9–10.2)
Carbon Dioxide, Total: 27 mEq/L (ref 22–32)
Chloride: 105 mEq/L (ref 98–108)
Creatinine: 1.89 mg/dL — ABNORMAL HIGH (ref 0.51–1.18)
GFR, Calc, African American: 43 mL/min — ABNORMAL LOW (ref >59)
GFR, Calc, European American: 35 mL/min — ABNORMAL LOW (ref >59)
Glucose: 113 mg/dL (ref 62–125)
Potassium: 4.2 mEq/L (ref 3.7–5.2)
Protein (Total): 6.2 g/dL (ref 6.0–8.2)
Sodium: 138 mEq/L (ref 136–145)
Urea Nitrogen: 37 mg/dL — ABNORMAL HIGH (ref 8–21)

## 2010-05-06 LAB — PR XR SHOULDER MIN 2 VIEWS BILAT

## 2010-05-06 LAB — LIPID PANEL
Cholesterol (LDL): 94 mg/dL (ref <130)
Cholesterol/HDL Ratio: 3.9
HDL Cholesterol: 38 mg/dL — ABNORMAL LOW (ref >40)
Non-HDL Cholesterol: 109 mg/dL (ref 0–159)
Total Cholesterol: 147 mg/dL (ref <200)
Triglyceride: 76 mg/dL (ref <150)

## 2010-05-06 LAB — PSA, DIAGNOSTIC/MONITORING: PSA, Diagnostic/Monitoring: 4.38 ng/mL — ABNORMAL HIGH (ref 0.00–4.00)

## 2010-05-07 ENCOUNTER — Ambulatory Visit: Payer: PRIVATE HEALTH INSURANCE | Attending: Pulmonary Disease | Admitting: Internal Medicine

## 2010-05-07 DIAGNOSIS — I129 Hypertensive chronic kidney disease with stage 1 through stage 4 chronic kidney disease, or unspecified chronic kidney disease: Secondary | ICD-10-CM | POA: Insufficient documentation

## 2010-05-07 DIAGNOSIS — N189 Chronic kidney disease, unspecified: Secondary | ICD-10-CM | POA: Insufficient documentation

## 2010-05-07 DIAGNOSIS — J4489 Other specified chronic obstructive pulmonary disease: Secondary | ICD-10-CM | POA: Insufficient documentation

## 2010-05-08 NOTE — Telephone Encounter (Signed)
Orders placed.  Pt had some labs recently ordered by Dr. Heloise Ochoa are supplemental.    Becky--Could you call pt to let him know?  Thanks  ct

## 2010-05-09 NOTE — Telephone Encounter (Addendum)
Advised pt, who will have them done before his appt.

## 2010-05-12 ENCOUNTER — Other Ambulatory Visit (HOSPITAL_BASED_OUTPATIENT_CLINIC_OR_DEPARTMENT_OTHER): Payer: Self-pay | Admitting: Family Medicine

## 2010-05-15 ENCOUNTER — Encounter (HOSPITAL_BASED_OUTPATIENT_CLINIC_OR_DEPARTMENT_OTHER): Payer: Self-pay | Admitting: Cardiovascular Disease

## 2010-05-16 ENCOUNTER — Encounter (HOSPITAL_BASED_OUTPATIENT_CLINIC_OR_DEPARTMENT_OTHER): Payer: PRIVATE HEALTH INSURANCE | Admitting: Cardiovascular Disease

## 2010-05-19 ENCOUNTER — Other Ambulatory Visit (HOSPITAL_BASED_OUTPATIENT_CLINIC_OR_DEPARTMENT_OTHER): Payer: Self-pay | Admitting: Cardiovascular Disease

## 2010-05-19 ENCOUNTER — Ambulatory Visit: Payer: PRIVATE HEALTH INSURANCE | Attending: Cardiovascular Disease | Admitting: Registered Nurse

## 2010-05-19 ENCOUNTER — Ambulatory Visit (HOSPITAL_BASED_OUTPATIENT_CLINIC_OR_DEPARTMENT_OTHER): Payer: PRIVATE HEALTH INSURANCE

## 2010-05-19 DIAGNOSIS — E7800 Pure hypercholesterolemia, unspecified: Secondary | ICD-10-CM | POA: Insufficient documentation

## 2010-05-19 DIAGNOSIS — R39198 Other difficulties with micturition: Secondary | ICD-10-CM | POA: Insufficient documentation

## 2010-05-19 DIAGNOSIS — N401 Enlarged prostate with lower urinary tract symptoms: Secondary | ICD-10-CM | POA: Insufficient documentation

## 2010-05-19 DIAGNOSIS — R972 Elevated prostate specific antigen [PSA]: Secondary | ICD-10-CM | POA: Insufficient documentation

## 2010-05-19 DIAGNOSIS — I712 Thoracic aortic aneurysm, without rupture, unspecified: Secondary | ICD-10-CM | POA: Insufficient documentation

## 2010-05-19 DIAGNOSIS — N419 Inflammatory disease of prostate, unspecified: Secondary | ICD-10-CM | POA: Insufficient documentation

## 2010-05-19 DIAGNOSIS — I10 Essential (primary) hypertension: Secondary | ICD-10-CM | POA: Insufficient documentation

## 2010-05-19 LAB — CBC, DIFF
% Basophils: 1 % (ref 0–1)
% Eosinophils: 6 % (ref 0–7)
% Immature Granulocytes: 1 % (ref 0–1)
% Lymphocytes: 30 % (ref 19–53)
% Monocytes: 9 % (ref 5–13)
% Neutrophils: 53 % (ref 34–71)
Absolute Eosinophil Count: 0.35 10*3/uL (ref 0.00–0.50)
Absolute Lymphocyte Count: 1.82 10*3/uL (ref 1.00–4.80)
Basophils: 0.03 10*3/uL (ref 0.00–0.20)
Hematocrit: 41 % (ref 38–50)
Hemoglobin: 13.4 g/dL (ref 13.0–18.0)
Immature Granulocytes: 0.04 10*3/uL (ref 0.00–0.05)
MCH: 29 pg (ref 27.3–33.6)
MCHC: 32.8 g/dL (ref 32.2–36.5)
MCV: 88 fL (ref 81–98)
Monocytes: 0.57 10*3/uL (ref 0.00–0.80)
Neutrophils: 3.29 10*3/uL (ref 1.80–7.00)
Platelet Count: 211 10*3/uL (ref 150–400)
RBC: 4.62 mil/uL (ref 4.40–5.60)
RDW-CV: 13.7 % (ref 11.6–14.4)
WBC: 6.1 10*3/uL (ref 4.3–10.0)

## 2010-05-19 LAB — BASIC METABOLIC PANEL
Anion Gap: 7 (ref 3–11)
Calcium: 9.5 mg/dL (ref 8.9–10.2)
Carbon Dioxide, Total: 27 mEq/L (ref 22–32)
Chloride: 105 mEq/L (ref 98–108)
Creatinine: 1.84 mg/dL — ABNORMAL HIGH (ref 0.51–1.18)
GFR, Calc, African American: 44 mL/min — ABNORMAL LOW (ref >59)
GFR, Calc, European American: 36 mL/min — ABNORMAL LOW (ref >59)
Glucose: 112 mg/dL (ref 62–125)
Potassium: 4.2 mEq/L (ref 3.7–5.2)
Sodium: 139 mEq/L (ref 136–145)
Urea Nitrogen: 36 mg/dL — ABNORMAL HIGH (ref 8–21)

## 2010-05-19 LAB — CK, CREATINE KINASE, TOTAL ACTIVITY: Creatinine Kinase Total Activity: 155 U/L (ref 30–285)

## 2010-05-20 ENCOUNTER — Ambulatory Visit: Payer: PRIVATE HEALTH INSURANCE | Attending: Family Medicine | Admitting: Family Medicine

## 2010-05-20 VITALS — BP 111/63 | HR 55 | Ht 70.25 in | Wt 239.5 lb

## 2010-05-20 DIAGNOSIS — M25519 Pain in unspecified shoulder: Secondary | ICD-10-CM | POA: Insufficient documentation

## 2010-05-20 DIAGNOSIS — J4489 Other specified chronic obstructive pulmonary disease: Secondary | ICD-10-CM | POA: Insufficient documentation

## 2010-05-20 DIAGNOSIS — E7800 Pure hypercholesterolemia, unspecified: Secondary | ICD-10-CM | POA: Insufficient documentation

## 2010-05-20 DIAGNOSIS — Z23 Encounter for immunization: Secondary | ICD-10-CM | POA: Insufficient documentation

## 2010-05-20 DIAGNOSIS — Z0000 Encounter for general adult medical examination without abnormal findings: Secondary | ICD-10-CM | POA: Insufficient documentation

## 2010-05-20 LAB — PR CT ANGIOGRAPHY CHEST W/CONTRAST/NONCONTRAST

## 2010-05-20 MED ORDER — SIMVASTATIN 40 MG OR TABS
ORAL_TABLET | ORAL | Status: DC
Start: 2010-05-20 — End: 2011-07-03

## 2010-05-20 NOTE — Progress Notes (Signed)
Pre-immunization screening questions were asked and answered.  VIS given to patient.  Patient has no allergies to vaccine or components.  Patient or their caregiver gave verbal consent for immunization.      Patient tolerated well.    I administered the injection.  Tamsin Nader, Medical Assistant

## 2010-05-20 NOTE — Progress Notes (Signed)
S:  Yearly exam.  Had labs recently    Saw urologist yeaterday--given prostatitis meds--antibiotics for 30 days and aleve--which he will take judiciously because of his kidney function. This was because of the mildly elevated PSA.    Shoulder xrays--shows moderate osteoparthrosis bilat . on L, some intraarticular bodies, though R shoulder is more painful for him.  Now has lots of pain lifting the R arm. Hit R arm on shoulder prior to that--achy but Ok. Refer to sports med. Does not really want PT at this point.    Using spiriva for about one year, now also on atrovent powder--voice comes and goes--gets hoarse easily. No trouble swallowing  Did pulm rehab last year--lots of exercises and stretches--found it very helpful to increase his exercise tolerance. Sees pulm regularly.    Retired this past Saturday--had big retirement party.    Breathing OK not coughing much, generally doing quite well.    Patient Active Problem List   Diagnoses Date Noted   . LOW BACK PAIN(LUMBAGO) 12/19/2009     Priority: High   . ROUTINE MEDICAL EXAM 05/15/2009     Priority: High     05/20/2010  Td/Tdap (q 10 yr):  2004  Pneumococcal (65 yoa and high risk x1):  2010, 2004  Influenza vaccine (q 1 yr):  2010 nov, 2011  Blood pressure:  excellent  Cholesterol (q 5 yr begin age 54):good 05/06/2010: H: 38  AAA screen (x1, male age 39, if ever smoked):  Checked at [place] [facility] and [facility]  Colorectal cancer: ct colonoscopy done 2008, repeat due 2013   PSA:  Slightly high 2012--seen by urology, prescribed antibiotics for prostatitis  Tobacco:  NS x 11 yrs, 50 pack yr hx  Exercise:  Reg--treadmill x 20-30 mins then weight machines--every other day!  Alcohol /Drugs:  None in 25 yrs  Sexual practice/contraception:    Advanced directives:         Marland Kitchen GENERAL OSTEOARTHROSIS 01/07/2007     Priority: High     Has seen orthopedics--cortisone shot to L knee helped.  L knee replacement 2008     . DIVERTICULITIS OF COLON W/O BLEED 01/07/2007     Priority: High      Hospitalized at [facility] May 2008     . TOBACCO USE DISORDER 12/24/2005     Priority: High     Quit 2000.  50 pack year history  EtOH--quit 1986.     Marland Kitchen HEARING LOSS NOS 12/24/2005     Priority: High     Bilat hearing aids.     . ECZEMA  12/24/2005     Priority: High     Lower extremities in particular.     Marland Kitchen ERECTILE DYSFUNCTION 12/24/2005     Priority: High   . BENIGN NEOPLASM LG BOWEL 12/24/2005     Priority: High     Tubular adenoma. Dx'ed flex sig 2000--> colonoscopy 2000: N.  F/U 2003: mild diverticulosis, tubular adenoma.  F/U due 2008 with CT colonography.--done 7/08: ess. N.  Next due 2013       . CALCULUS OF KIDNEY 12/24/2005     Priority: High     Lithotripsy 1988  Stone panel in 0ct 09: high oxalate. Given low oxalae diet by Dr. Arbie Cookey, nephrology in 2009.       Marland Kitchen COPD 12/24/2005     Priority: High     Emphysema: an FEV1 which is approximately at 33% of predicted    04/2010--Pulm: Chronic obstructive pulmonary disease, severe to  very severe. I explained to him that continuing on the Spiriva as well as the Serevent was likely good therapy.I further told him that if he should have recurrent exacerbations within the course of the year that I would consider adding an inhaled corticosteroid at that point in time. He does not need oxygen at this point in time. He remains a nonsmoker. He is already engaged in a pulmonary rehabilitation.    Today I described to him how overall lung volume reduction surgery had no significant benefit on mortality. Subgroup analysis did indicate that those patients, who had upper lobe predominant disease with low exercise capacity were the most likely to benefit. Given the fact that he actually still has reasonably good functional status and is getting around and going to the gym and working out, it is not clear that he would be in the group that would most likely benefit, although, to safe for certain, we need to get a cardiopulmonary exercise test. At this point in time, he is  feeling good with his chronic obstructive pulmonary disease therapy and is not interested in pursuing lung volume reduction surgery.         . THORACIC AORTIC ANEURYSM 12/24/2005     Priority: High     Ascending aortic aneurysm.  Followed by Dr. Brunilda Payor, Cardiac surgery.  Gets regular CT scans.  Goal systolic BP: 100-120     . ABDOM AORTIC ANEURYSM 12/24/2005     Priority: High     Father also had AAA. Borderline, seen on CT 8/06.     Marland Kitchen VENTRICULAR SEPT DEFECT 12/24/2005     Priority: High     Small muscular VSD--NEEDS SBE PROPHYLAXIS. (Clindamycin 600 mg prior to dental visits.)     . HYPERTROPHY PROSTATE W/O OBST 12/24/2005     Priority: High   . Chronic Kidney Disease -- Stage III 07/22/2005     Priority: High     Creat ~ 1.8. Normal albuminuria (8/08)  Likely 2/2 HTN, poss component from hx obstructing nephrolithiasis.  8/08 Renal: referred to Uro for 8mm stone, nonobstructing. Also doing w/u for stone type, poss target prevention.     Marland Kitchen HYPERTENSION NOS(aka HTN) 03/29/2002     Priority: High   . PURE HYPERCHOLESTEROLEM 03/29/2002     Priority: High   . MALIG NEOPLASM DORSAL TONGUE(aka CANCER) 03/29/2002     Priority: High     Ssurgery 1994, T1N1 squamous cell ca L tongue, partial glossectomy; L supraorbital hyoid neck dissection         Outpatient Prescriptions Prior to Visit   Medication Sig Dispense Refill   . ASPIRIN 81 MG OR TABS 1 TABLET DAILY  90  4   . Atenolol 50 MG Oral Tab TAKE ONE TABLET DAILY  90 Tab  4   . Benazepril HCl 20 MG OR TABS Take 1 tablet by mouth daily  90 Tab  3   . Finasteride 5 MG Oral Tab 1 TABLET DAILY  90 Tab  prn   . Hydrochlorothiazide 25 MG OR TABS Take 1 tablet by mouth every day  90 Tab  prn   . NIFEdipine 90 MG OR TB24 1 tab po qd  90 Tab  prn   . Simvastatin 40 MG Oral Tab Take 1 tablet by mouth every evening for cholesterol  90 Tab  3   . Tamsulosin HCl (FLOMAX) 0.4 MG OR CAPS one capsule by mouth at bedtime  90 Cap  3   . Tiotropium Bromide  Monohydrate (SPIRIVA HANDIHALER)  18 MCG Inhalation Cap None Entered         Review of Systems:  Constitutional:  Denies unexplained weight loss or gain, fever, fatigue.  Skin: Denies rashes, unusual moles or skin changes.  Denies hair or nail changes.  Eyes:  Denies eye pain, redness, drainage, change in vision or double vision.  HENT:  Denies ear pain, sore throat, nasal drainage.does have hoarseness associated with his inhalers, does have bilat hearing loss for which he has hearing aids.  Respiratory:  Denies hemoptysis.  Cardiovascular: Denies chest pain, palpitations, mild edema in ankles edema--chronic.  GI:  Denies nausea, vomiting, abdominal pain, change in stools.  Neuro:  Denies headaches, paresthesias, weakness, difficulties with balance.  Psych:  Denies depressed mood, anxiety, anhedonia, sleep disturbance. denies memory problems.  Heme/Lymph:  Denies swollen glands, anemia  Endo:  Denies cold or heat intolerance, polyuria, polydipsia.Marland Kitchen    History     Social History Narrative    From sept 2011 note from Dr. Garnette Czech, cardiology:  He has been married 3 times, 25 years to his current wife.  He has 5 daughters living and well although two of his daughters are rather overweight. He is working as a Naval architect. He smoked one package of cigarettes a day from the age of 18 years until about 10 years ago, although for the last 5 years of smoking, he was down to about half a package of cigarettes a day.  He is an admitted alcoholic and quit drinking in 2956, at which time he attended AA meetings but not any more.1.24.2012: Married to [employer]. 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 Plans on spending more time woodworking. Goes to gym--exercises 20-30 mins every other day. ct         O;  Pt alert and cooperative and in NAD.  BP 111/63  Pulse 55  Ht 5' 10.25" (1.784 m)  Wt 239 lb 8 oz (108.636 kg)  BMI 34.12 kg/m2  H+N: PERL, EOMs full. Oroph: unremarkable, TMs clear bilat is wearing hearing aids bilat,  no thyromeg, no increased cervical LNs. No supra clavicular LNs. Scars on L side of neck from prior surgery  Chest: clear to bases bilat. But decreased A/E bilat  CV: Faint S1 and S2, no murmurs heard.  Good peripheral pulses.  Abdo: mildly obese. BS present throughout, soft, NT, ND, no organomegaly or masses. Scarred from prior surgeries  Extrem: decreased ROM R shoulder  Neuro: grossly normal to observation, gait normal    A/P:   JOINT PAIN-SHLDER  (primary encounter diagnosis)  Comment: because of significantly decreased ROM R shoulder, will have pt seen by sports med to see if shoulder steroid injection would be of benefit.   Plan: REFERRAL TO SPORTS MEDICINE        Pt has not yet had MRI shoulder--will defer that decision to sports med.     PURE HYPERCHOLESTEROLEM  Comment: continue with exercise and with simvastatin  Plan: Simvastatin 40 MG Oral Tab             VACCINE FOR DTP/DTAP/TDAP  Comment: pt will be in contact with his newborn greatgranddaughter soon so will give this immunization  Plan: TDAP VACC, >7 YRS, 0.5 ML/IM, IMMUNIZATION         ADMIN, SINGLE IMM             COPD--[facility] notes from pulmonology reviewed. He is surrently doing well conitnue on current  meds.    RTC prn.    Tana Coast, MD

## 2010-05-21 NOTE — Progress Notes (Signed)
---------------------------------------------    I have reviewed the resident's note and agree with the findings, assessment and plan documented in the note.  Attending: Barret Esquivel, MD  ---------------------------------------------

## 2010-05-23 ENCOUNTER — Ambulatory Visit: Payer: PRIVATE HEALTH INSURANCE | Attending: Family Medicine | Admitting: Cardiovascular Disease

## 2010-05-23 ENCOUNTER — Encounter (HOSPITAL_BASED_OUTPATIENT_CLINIC_OR_DEPARTMENT_OTHER): Payer: Self-pay | Admitting: Family Medicine

## 2010-05-23 ENCOUNTER — Ambulatory Visit (HOSPITAL_BASED_OUTPATIENT_CLINIC_OR_DEPARTMENT_OTHER): Payer: PRIVATE HEALTH INSURANCE | Admitting: Family Medicine

## 2010-05-23 VITALS — BP 118/64 | HR 53 | Wt 241.4 lb

## 2010-05-23 DIAGNOSIS — E785 Hyperlipidemia, unspecified: Secondary | ICD-10-CM | POA: Insufficient documentation

## 2010-05-23 DIAGNOSIS — J4489 Other specified chronic obstructive pulmonary disease: Secondary | ICD-10-CM | POA: Insufficient documentation

## 2010-05-23 DIAGNOSIS — I719 Aortic aneurysm of unspecified site, without rupture: Secondary | ICD-10-CM | POA: Insufficient documentation

## 2010-05-23 DIAGNOSIS — I10 Essential (primary) hypertension: Secondary | ICD-10-CM | POA: Insufficient documentation

## 2010-05-23 DIAGNOSIS — M19019 Primary osteoarthritis, unspecified shoulder: Secondary | ICD-10-CM | POA: Insufficient documentation

## 2010-05-23 NOTE — Progress Notes (Signed)
CC: Right Shoulder Pain  HPI: Tracy Conway is a 76 year old male presenting with a 5-6 week h/o right superior shoulder pain.   Symptoms began when reaching down to catch an object that was falling in the shower, and then raising his arm, inadvertently striking the shower water outlet, causing sharp pain in the superior shoulder. 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 Pain is currently described as an ache with overhead and reaching back motions.. Intensity of discomfort is 3-5/10, made worse by the aforementioned activitys. Pain is improved by alleve, which he has taken for prostatis. no h/o dislocation and no h/o subluxation. Treatments employed to date have included NSAID's, and there as been some interval improvement as a result of these treatments. No prior h/o shoulder problems.    ROS: As per HPI, no other joint pain, stiffness, fevers, chills, malaise, rash, N/V/D/C, CP, SOB or other complaint on 12 point CPT based review of systems.    Patient Active Problem List   Diagnoses   . HYPERTENSION NOS(aka HTN)   . PURE HYPERCHOLESTEROLEM   . MALIG NEOPLASM DORSAL TONGUE(aka CANCER)   . Chronic Kidney Disease -- Stage III   . TOBACCO USE DISORDER   . HEARING LOSS NOS   . ECZEMA    . ERECTILE DYSFUNCTION   . BENIGN NEOPLASM LG BOWEL   . CALCULUS OF KIDNEY   . COPD   . THORACIC AORTIC ANEURYSM   . ABDOM AORTIC ANEURYSM   . VENTRICULAR SEPT DEFECT   . 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 GENERAL OSTEOARTHROSIS   . DIVERTICULITIS OF COLON W/O BLEED   . ROUTINE MEDICAL EXAM   . LOW BACK PAIN(LUMBAGO)       Past Medical History   Diagnosis Date   . AORTIC ANEURYSM NOS      Aortic Aneurysm   . MALIG NEOPLASM DORSAL TONGUE(aka CANCER) 03/29/2002     Ssurgery 1994, T1N1 squamous cell ca L tongue, partial glossectomy; L supraorbital hyoid neck dissection    . TOBACCO USE DISORDER 12/24/2005     Quit 2000.   Marland Kitchen HEARING LOSS NOS 12/24/2005     Bilat hearing aids.   . ECZEMA  12/24/2005      Lower extremities in particular.   Marland Kitchen ERECTILE DYSFUNCTION 12/24/2005   . BENIGN NEOPLASM LG BOWEL 12/24/2005     Tubular adenoma. Dx'ed flex sig 2000--> colonoscopy 2000: N.  F/U 2003: mild diverticulosis, tubular adenoma.  F/U due 2008 with CT colonography.   . IMPAIRED RENAL FUNCT DISORDERS NEC 07/22/2005     Creat ~ 1.2   . CALCULUS OF KIDNEY 12/24/2005     Lithotripsy 1988   . 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 THORACIC AORTIC ANEURYSM 12/24/2005     Ascending aortic aneurysm.  Followed by Dr. Brunilda Payor, Cardiac surgery.  Gets regular CT scans.  Goal systolic BP: 100-120   . ABDOM AORTIC ANEURYSM 12/24/2005     Borderline, seen on CT 8/06.   Marland Kitchen VENTRICULAR SEPT DEFECT 12/24/2005     Small muscular VSD--NEEDS SBE PROPHYLAXIS. (Clindamycin 600 mg prior to dental visits.)   . HYPERTROPHY PROSTATE W/O OBST 12/24/2005   . GENERAL OSTEOARTHROSIS 01/07/2007     Has seen orthopedics--cortisone shot to L knee helped.   Marland Kitchen DIVERTICULITIS OF COLON W/O BLEED 01/07/2007     Hospitalized at [facility] May 2008       Current Outpatient Prescriptions  Medication Sig   . ASPIRIN 81 MG OR TABS 1 TABLET DAILY   . Atenolol 50 MG Oral Tab TAKE ONE TABLET DAILY   . Benazepril HCl 20 MG OR TABS Take 1 tablet by mouth daily   . Finasteride 5 MG Oral Tab 1 TABLET DAILY   . Hydrochlorothiazide 25 MG OR TABS Take 1 tablet by mouth every day   . NIFEdipine 90 MG OR TB24 1 tab po qd   . Simvastatin 40 MG Oral Tab Take 1 tablet by mouth every evening for cholesterol   . Tamsulosin HCl (FLOMAX) 0.4 MG OR CAPS one capsule by mouth at bedtime   . Tiotropium Bromide Monohydrate (SPIRIVA HANDIHALER) 18 MCG Inhalation Cap None Entered       Review of patient's allergies indicates:  Allergies   Allergen Reactions   . Penicillin G      hives         Family History   Problem Relation Age of Onset   . Cancer Father      Prostate   . Arthritis Father    . Other Father      Chronic Kidney Disease   . Psych Mother      Alzheimer's Disease       History     Social History  Narrative    From sept 2011 note from Dr. Garnette Czech, cardiology:  He has been married 3 times, 25 years to his current wife.  He has 5 daughters living and well although two of his daughters are rather overweight. He is working as a Naval architect. He smoked one package of cigarettes a day from the age of 18 years until about 10 years ago, although for the last 5 years of smoking, he was down to about half a package of cigarettes a day.  He is an admitted alcoholic and quit drinking in 0981, at which time he attended AA meetings but not any more.[id number]: Married to [employer]. 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 Plans on spending more time woodworking. Goes to gym--exercises 20-30 mins every other day. Ct-- Considering working for [employer]       PE: BP 118/64  Pulse 53  Wt 241 lb 6.4 oz (109.498 kg)  GEN: NAD, cooperative with exam  Shoulder Exam (focus on R)  -- Inspection: mild AC joint prominence/ deformity, no asymmetry, no atrophy. Scapulothoracic motion reveals no gross winging.  -- ROM: Abduction to  100 degrees, limited by pain. Cross Body Adduction  to  20 degrees, not grossly painful. Forward flexion  to  120 (pain limited) degrees. Extension  to  50 degrees. IR  to the T10 (T7 on other side) level. ER  to  60 degrees.  -- Strength: 5-/5h abduction, 55 with adduction,55 with IR,5/5with ER,55 with flexion, and 55 with extension.   -- Special tests: Jobe's test is pos for pain and 4/5 strength. Drop arm test neg. Hawkin's test def and Neer's test def. O'Brien's test POS and Crank test neg. Speed's test neg and Yergason's test neg. Lift off test causes anterior shoulder pain.   -- Palpation:: No gross tenderness at the glenohumeral, acromioclavicular, or sternoclavicular joints. Tenderness is absent at the bicipital groove. no trigger point tenderness to palpation.  -- Neurovascular:  Intact with regard to sensation and distal pulses (radial, antecubital).    Injection  of Right shoulder acromioclavicular joint    Informed Consent:    The risks and benefits  of the procedure were discussed.  Potential benefits include decreased pain, reduced inflammation, and improved function.  The risks include the small risk of bleeding, a reaction to the medicine, allergy, infection, skin atrophy or hypopigmentation.  The patient was given an opportunity to ask questions.  Both verbal and written consent was obtained before the procedure.      Procedure:  Final verification of correct patient identity, procedure, site/side and instrument was performed prior to start of procedure: YES  Shoulder AC jointInjection:  The patient was in the sitting position and a superior approach (lateral to medial ) taken for the injection, based off an ultrasound utilization to identify best approach to injection.  The skin was prepped with Betadine and alcohol, Using a 25G needle, a mixture of 1 cc of 1% lidocaine and 1 cc of Kenalog 40 mg/ml was injected easily into the subacromial space.  A sterile bandage was applied.  I was present for the entire procedure and assisted Dr Aneta Mins who performed the following parts of the procedure: Corticosteroid injection of the [facility] joint.   The patient tolerated the procedure well. Symptoms were improved after the injection with O'Brien's test. Jobe's test improved, but pain is still posterioly.    A/PR shoulder pain with mixed etiology, x-rays show AC joint and glenohumer arthritis, with exam showing today more AC joint related symptoms to my appreciation than rotator cuff symptoms, though there are components of pathology in both areas  1. S/p corticosteroid injection to the [facility] joint  2. Consider f/u if 1 month, if pain persists, to address the subacromial space and rotator cuff  3. Glenohumeral joint doesn't seem as involved    Chryl Heck, M.D.  Family and Sports Medicine  [facility] [place]

## 2010-05-23 NOTE — Progress Notes (Signed)
Tracy Conway is a 76 year old male here to discuss the following:    Right shoulder pain: has chronic OA with manageable "achiness" in bilateral shoulders. Injured shoulder in 02/2010 in the shower; something was falling, he reached up to grab it, hit his arm on the faucet, which caused sudden downward movement of his arm with immediate sharp pain over antero-superior aspect of the shoulder. Since then has had slightly decreased but continued pain. Has never had similar symptoms. No previous traumatic injury to the area.  Pain awakens him at night, worsened with lying on the right side, but not immediately relieved by offloading pressure. Has tried tylenol, not much improvement. Taking Aleve this week for prostatitis, noted improvement in shoulder pain.  Has difficulty with straight arm raise. Interfering with ADLs, including putting dishes away, but okay if he keeps arm close to body. No numbness or tingling. Not certain that there's actual weakness, as he thinks pain limits most motion. Goes to gym every other day, cannot do certain strength exercises/machines 2/2 pain.    REVIEW OF SYSTEMS:  No fevers, chills. No rashes. No nausea/vomiting.    Past Medical History   Diagnosis Date   . AORTIC ANEURYSM NOS      Aortic Aneurysm   . MALIG NEOPLASM DORSAL TONGUE(aka CANCER) 03/29/2002     Ssurgery 1994, T1N1 squamous cell ca L tongue, partial glossectomy; L supraorbital hyoid neck dissection    . TOBACCO USE DISORDER 12/24/2005     Quit 2000.   Marland Kitchen HEARING LOSS NOS 12/24/2005     Bilat hearing aids.   . ECZEMA  12/24/2005     Lower extremities in particular.   Marland Kitchen ERECTILE DYSFUNCTION 12/24/2005   . BENIGN NEOPLASM LG BOWEL 12/24/2005     Tubular adenoma. Dx'ed flex sig 2000--> colonoscopy 2000: N.  F/U 2003: mild diverticulosis, tubular adenoma.  F/U due 2008 with CT colonography.   . IMPAIRED RENAL FUNCT DISORDERS NEC 07/22/2005     Creat ~ 1.2   . CALCULUS OF KIDNEY 12/24/2005     Lithotripsy 1988   . 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 THORACIC AORTIC ANEURYSM 12/24/2005     Ascending aortic aneurysm.  Followed by Dr. Brunilda Payor, Cardiac surgery.  Gets regular CT scans.  Goal systolic BP: 100-120   . ABDOM AORTIC ANEURYSM 12/24/2005     Borderline, seen on CT 8/06.   Marland Kitchen VENTRICULAR SEPT DEFECT 12/24/2005     Small muscular VSD--NEEDS SBE PROPHYLAXIS. (Clindamycin 600 mg prior to dental visits.)   . HYPERTROPHY PROSTATE W/O OBST 12/24/2005   . GENERAL OSTEOARTHROSIS 01/07/2007     Has seen orthopedics--cortisone shot to L knee helped.   Marland Kitchen DIVERTICULITIS OF COLON W/O BLEED 01/07/2007     Hospitalized at [facility] May 2008     Past Surgical History   Procedure Date   . Ventral hernia repair      x3   . Removal of tongue carcinoma with lymphadenectomy    . Fusion l foot distal phalange    . Lithotrypsy of r renal calculi    . Retinaculum release b/l wrists    . Vasectomy w/tray      x2   . Vasectomy reversal      Outpatient Prescriptions Prior to Visit   Medication Sig Dispense Refill   . ASPIRIN 81 MG OR TABS 1 TABLET DAILY  90  4   . Atenolol 50 MG Oral Tab TAKE ONE TABLET DAILY  90 Tab  4   . Benazepril HCl 20 MG OR TABS Take 1 tablet by mouth daily  90 Tab  3   . Finasteride 5 MG Oral Tab 1 TABLET DAILY  90 Tab  prn   . Hydrochlorothiazide 25 MG OR TABS Take 1 tablet by mouth every day  90 Tab  prn   . NIFEdipine 90 MG OR TB24 1 tab po qd  90 Tab  prn   . Simvastatin 40 MG Oral Tab Take 1 tablet by mouth every evening for cholesterol  90 Tab  3   . Tamsulosin HCl (FLOMAX) 0.4 MG OR CAPS one capsule by mouth at bedtime  90 Cap  3   . Tiotropium Bromide Monohydrate (SPIRIVA HANDIHALER) 18 MCG Inhalation Cap None Entered         History     Social History   . Marital Status: Married     Spouse Name: N/A     Number of Children: 5   . Years of Education: N/A     Occupational History   . STAFF      Purchasing     Social History Main Topics   . Smoking status: Former Games developer   . Smokeless tobacco: Not on file    Comment: quit three years ago   . Alcohol  Use: No      quit 17 years ago   . Drug Use: No   . Sexually Active: Yes -- Male partner(s)      no protection     Other Topics Concern   . Not on file     Social History Narrative    From sept 2011 note from Dr. Garnette Czech, cardiology:  He has been married 3 times, 25 years to his current wife.  He has 5 daughters living and well although two of his daughters are rather overweight. He is working as a Naval architect. He smoked one package of cigarettes a day from the age of 18 years until about 10 years ago, although for the last 5 years of smoking, he was down to about half a package of cigarettes a day.  He is an admitted alcoholic and quit drinking in 1610, at which time he attended AA meetings but not any more.[id number]: Married to [employer]. 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 Plans on spending more time woodworking. Goes to gym--exercises 20-30 mins every other day. Ct-- Considering working for [employer]       OBJECTIVE:  BP 118/64  Pulse 53  Wt 241 lb 6.4 oz (109.498 kg)  Last blood pressure recorded in this encounter:  118/64  General: healthy, alert, no distress  MSK, shoulders:  Inspection: holds right shoulder higher than the left, no obvious deformity, no scapular winging  Palpation: TTP over right AC joint, slightly in subacromial space anteriorly and posteriorly. No TTP over glenohumeral joint.  ROM: abduction: 100 on right, 120 on left, flexion 120 on right, 160 on left, external rotation 60 bilaterally. Internal rotation on left to T7, on right to T10, chronic per patient. Cross body adduction 20 degrees  Strength: 5/5 bilateral biceps, triceps, external rotation, 4+/5 on right internal rotation, 5/5 on left IR.  5-/5 right abduction  Special tests: O'Brien's test positive, Neer's test negative, lift off causes anterior shoulder pain. Crank test negative.  Neurovascular: sensation and radial, antecubital pulses intact      Procedure note: RIGHT  acromioclavicular joint corticosteroid injection  Verbal  informed consent was obtained, discussing the risks (including pain, bleeding, infection, fat necrosis, skin pigmentation), benefits, and alternatives.     The [facility] joint and approach was marked using ultrasound guidance. The skin was prepped and draped in the usual sterile fashion using sterile cleanser. Needle of appropriate gauge and length for procedure was directed towards the target location.     After negative aspirate, solution was administered using volumes documented below. Needle was removed, skin cleansed, and bandage placed over injection site.     No complications. Patient tolerated the procedure well. Pt reported good symptomatic improvement during anesthetic phase. Pt was counseled re: icing and activity modification. O'Brien's test was negative after injection, and Jobe's test improved, but still with mild-mod posterior shoulder pain after the injection.    Solution volumes: 1 cc of 1% lidocaine and 1 cc of Kenalog 40 mg/ml      ASSESSMENT AND PLAN:    Right shoulder pain: Likely combination of right acromioclavicular joint sprain and osteoarthritis, with possible contribution of rotator cuff sprain given weakness on my exam today. Right AC joint injection was well tolerated and provided some immediate benefit.  -f/u 1 month if pain persists, may consider subacromial injection to address rotator cuff    Jonelle Sports, MD

## 2010-05-27 ENCOUNTER — Encounter (HOSPITAL_BASED_OUTPATIENT_CLINIC_OR_DEPARTMENT_OTHER): Payer: Self-pay | Admitting: Family Medicine

## 2010-06-27 ENCOUNTER — Encounter (HOSPITAL_BASED_OUTPATIENT_CLINIC_OR_DEPARTMENT_OTHER): Payer: Self-pay | Admitting: Family Medicine

## 2010-07-03 ENCOUNTER — Telehealth (INDEPENDENT_AMBULATORY_CARE_PROVIDER_SITE_OTHER): Payer: Self-pay | Admitting: Family Medicine

## 2010-07-03 NOTE — Telephone Encounter (Signed)
I saw pt on my Saturday schedule and noted he had a recent shoulder injection.  I felt I should call him as I do not do repeat shoulder injections.  He said his shoulder was painful.  Pt has seen Dr. Smith Robert and the note says the patient may need follow up.  When the pt called he was told that Dr. Smith Robert was not available until much later this month (Mar 23 is correct) 'and I just can't wait that long.'  He has an appt with me on Saturday and hopes I could give the injection or some relief.  I told him I was not qualified to do a shoulder injection on Saturday.  Dr. Smith Robert does not have an opening until Mar 23.  It is not clear that the pt does need or qualify for a second injection.  Best plan we can come up with tonight:  Ask Dr. Jonathon Bellows if he is comfortable seeing the patient.

## 2010-07-04 ENCOUNTER — Ambulatory Visit (INDEPENDENT_AMBULATORY_CARE_PROVIDER_SITE_OTHER): Payer: Medicare Other | Admitting: Family Medicine

## 2010-07-04 ENCOUNTER — Encounter (INDEPENDENT_AMBULATORY_CARE_PROVIDER_SITE_OTHER): Payer: Self-pay | Admitting: Family Medicine

## 2010-07-04 VITALS — BP 114/62 | HR 64 | Temp 97.0°F | Resp 12 | Wt 234.0 lb

## 2010-07-04 NOTE — Telephone Encounter (Signed)
Patient seen today in clinic, see my note for additional details.    Kathrynn Ducking, MD

## 2010-07-04 NOTE — Progress Notes (Signed)
------------------------------------------    Attending: Carlena Hurl, MD  I saw and evaluated the patient with this resident.  I repeated the essential components of the history and exam.  I discussed the case with the resident, and have reviewed the resident's note.  I agree with the findings and plan as documented in the resident's note.  Key findings based upon my evaluation and discussion:  76 year old man here for follow up of his right shoulder pain.  His ac pain has improved since injection at the end of Jan  But he now has more subachromial impingement type pain.  Will try Subacrom injection.  See note from Dr. Jonathon Bellows.  ----------------------------------------  ---------------------------------------------  Attending: Carlena Hurl, MD    Procedure:  Subacromial injection - steroid/lidocaine    Consent:  The patient was informed of the indication for, nature of, and risks and benefits of the procedure.  Consent was obtained from the patient.    Final verification:  Immediately prior the following were verified:  --Correct patient using name and date of birth:  Yes  --Correct procedure:  Yes  --Correct site and side:  Yes  --Correct patient position for procedure: Yes    I was present for or participated in the key portions of the procedure.    Comments:  R shoulder area injection - posterior approach - completed.  See note by Dr. Jonathon Bellows.     ----------------------------------------------

## 2010-07-04 NOTE — Progress Notes (Signed)
CC: R shoulder pain    HPI:  - Follow-up regarding R shoulder pain initially evaluated by Dr. Smith Robert.  As per previous notes, onset was after reaching down in shower and suddenly lifting object.  Had [facility] joint corticosteroid injection on 1/27.  States this helped significantly with his superior, localized shoulder pain.  Since had noted more functional and aching shoulder pain.  When describing most painful movements they mostly encompass internal rotation and abduction.  Pain is roughly stable.  Denies laxity or dislocation.    Patient Active Problem List   Diagnoses   . HYPERTENSION NOS(aka HTN)   . PURE HYPERCHOLESTEROLEM   . MALIG NEOPLASM DORSAL TONGUE(aka CANCER)   . Chronic Kidney Disease -- Stage III   . TOBACCO USE DISORDER   . HEARING LOSS NOS   . ECZEMA    . ERECTILE DYSFUNCTION   . BENIGN NEOPLASM LG BOWEL   . CALCULUS OF KIDNEY   . COPD   . THORACIC AORTIC ANEURYSM   . ABDOM AORTIC ANEURYSM   . VENTRICULAR SEPT DEFECT   . 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 GENERAL OSTEOARTHROSIS   . DIVERTICULITIS OF COLON W/O BLEED   . ROUTINE MEDICAL EXAM   . LOW BACK PAIN(LUMBAGO)     EXAM:  Filed Vitals:    07/04/10 0940   BP: 114/62   Pulse: 64   Temp: 97 F (36.1 C)   TempSrc: Temporal   Resp: 12   Weight: 234 lb (106.142 kg)   SpO2: 94%   -reviewed    General: Alert, pleasant, NAD  Shoulder Exam (R)  -- Inspection: no deformity, no asymmetry, no atrophy.  -- ROM: Abduction to  160 degrees. Cross Body Adduction  to  20 degrees. Forward flexion  to  180 degrees. Extension  to  30 degrees. IR  to the L1 level. ER  to 75 degrees.  -- Strength: 5/5 with abduction, 5/5 with adduction,4/5 with IR,5/5 with ER,5/5 with flexion, and 5/5 with extension.   -- Special tests: Jobe's test is + for pain and 4/5 strenght. Drop arm test negn. Lift off test positive  -- Palpation:: no tenderness at the glenohumeral, acromioclavicular, or sternoclavicular joints. Tenderness is no at the bicipital groove. no trigger point tenderness to  palpation.  -- Neurovascular:  Intact with regard to sensation and distal pulses (radial, antecubital).    ASSESSMENT AND PLAN:   SPRAIN ROTATOR CUFF  (primary encounter diagnosis)  Comment: AC joint symptoms largely resolved after corticosteroid injection with Dr. Smith Robert.  Now with hx and exam more consistent with cuff pathology.  Given symptoms and response to previous injection pt desired additional injection today.  Feel this is reasonable given presentation, subacromial injection was performed today, see note.  Dr. Waynette Buttery was present for the entire procedure.  Follow-up in 1 month for further evaluation, may consider additional imaging if not improving.    Right subacromial bursa injection    Informed Consent:   The risks and benefits of the procedure were discussed. Potential benefits include decreased pain, reduced inflammation, and improved function. The risks include the small risk of bleeding, a reaction to the medicine, allergy, infection, skin atrophy or hypopigmentation. The patient was given an opportunity to ask questions. Verbal written consent was obtained before the procedure.   Procedure:   Final verification of correct patient identity, procedure, site/side and instrument was performed prior to start of procedure: YES   Shoulder AC jointInjection: The patient was in the sitting position  and a posterior approach was taken for the injection. The skin was prepped with Betadine and alcohol. Using a 30G needle 2cc on 1% lidocaine with injected into the subcutaneous space. Then, using a 25G needle, a mixture of 5 cc of 1% lidocaine and 1 cc of Kenalog 40 mg/ml was injected easily into the subacromial space. A sterile bandage was applied. The patient tolerated the procedure well. Symptoms were improved after the injection.    Kathrynn Ducking, MD

## 2010-07-05 ENCOUNTER — Ambulatory Visit (INDEPENDENT_AMBULATORY_CARE_PROVIDER_SITE_OTHER): Payer: Self-pay | Admitting: Family Medicine

## 2010-07-05 NOTE — Progress Notes (Signed)
---------------------------------------------    I have reviewed the resident's note and agree with the findings, assessment and plan documented in the note.  Attending: Irianna Gilday Thomas Glendal Cassaday JR, MD  ---------------------------------------------

## 2010-07-07 ENCOUNTER — Other Ambulatory Visit (HOSPITAL_BASED_OUTPATIENT_CLINIC_OR_DEPARTMENT_OTHER): Payer: Self-pay | Admitting: Registered Nurse

## 2010-07-07 ENCOUNTER — Ambulatory Visit (INDEPENDENT_AMBULATORY_CARE_PROVIDER_SITE_OTHER): Payer: Self-pay | Admitting: Family Medicine

## 2010-07-07 ENCOUNTER — Ambulatory Visit: Payer: PRIVATE HEALTH INSURANCE | Attending: Registered Nurse | Admitting: Registered Nurse

## 2010-07-07 DIAGNOSIS — Z8042 Family history of malignant neoplasm of prostate: Secondary | ICD-10-CM | POA: Insufficient documentation

## 2010-07-07 DIAGNOSIS — N403 Nodular prostate with lower urinary tract symptoms: Secondary | ICD-10-CM | POA: Insufficient documentation

## 2010-07-07 DIAGNOSIS — R972 Elevated prostate specific antigen [PSA]: Secondary | ICD-10-CM | POA: Insufficient documentation

## 2010-07-07 DIAGNOSIS — N138 Other obstructive and reflux uropathy: Secondary | ICD-10-CM | POA: Insufficient documentation

## 2010-07-07 LAB — PSA, FREE
Prostate Specific Antigen (Free) %: 17 %
Prostate Specific Antigen (Free): 0.6 ng/mL

## 2010-07-07 LAB — PSA, TOTAL & REFLEXIVE FREE: PSA, Diagnostic/Monitoring: 3.63 ng/mL (ref 0.00–4.00)

## 2010-07-09 NOTE — Progress Notes (Signed)
Addended by: Ovidio Kin on: 07/09/2010 01:14 PM     Modules accepted: Level of Service

## 2010-07-24 ENCOUNTER — Other Ambulatory Visit (HOSPITAL_BASED_OUTPATIENT_CLINIC_OR_DEPARTMENT_OTHER): Payer: Self-pay | Admitting: Urology

## 2010-07-24 ENCOUNTER — Ambulatory Visit: Payer: PRIVATE HEALTH INSURANCE | Attending: Urology | Admitting: Urology

## 2010-07-24 DIAGNOSIS — R972 Elevated prostate specific antigen [PSA]: Secondary | ICD-10-CM | POA: Insufficient documentation

## 2010-07-24 DIAGNOSIS — C61 Malignant neoplasm of prostate: Secondary | ICD-10-CM | POA: Insufficient documentation

## 2010-08-07 ENCOUNTER — Ambulatory Visit: Payer: PRIVATE HEALTH INSURANCE | Attending: Urology | Admitting: Urology

## 2010-08-07 DIAGNOSIS — C61 Malignant neoplasm of prostate: Secondary | ICD-10-CM | POA: Insufficient documentation

## 2010-08-08 ENCOUNTER — Ambulatory Visit (INDEPENDENT_AMBULATORY_CARE_PROVIDER_SITE_OTHER): Payer: PRIVATE HEALTH INSURANCE | Admitting: Family Medicine

## 2010-08-08 ENCOUNTER — Encounter (INDEPENDENT_AMBULATORY_CARE_PROVIDER_SITE_OTHER): Payer: Self-pay | Admitting: Family Medicine

## 2010-08-08 VITALS — BP 107/58 | HR 58 | Temp 97.4°F | Resp 16 | Wt 218.0 lb

## 2010-08-08 NOTE — Progress Notes (Signed)
CC: f/u R shoulder pain  HPI: Tracy Conway is a 76 year old male presenting for f/u. See Dr. Ezekiel Ina note for full details. He continues to have some residual pain and clicking deep in and in the superior shoulder. Pain is worse with activity, but he remains active. He denies any progression other than in the L shoulder, which he "tweaked" a few days ago, and reports having some discomfort that is somewhat radiating down the arm. 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    ROS: As per HPI, no other joint pain, stiffness, fevers, chills, malaise, rash, N/V/D/C, CP, SOB or other complaint. All other ROS negative    Patient Active Problem List   Diagnoses   . HYPERTENSION NOS(aka HTN)   . PURE HYPERCHOLESTEROLEM   . MALIG NEOPLASM DORSAL TONGUE(aka CANCER)   . Chronic Kidney Disease -- Stage III   . TOBACCO USE DISORDER   . HEARING LOSS NOS   . ECZEMA    . ERECTILE DYSFUNCTION   . BENIGN NEOPLASM LG BOWEL   . CALCULUS OF KIDNEY   . COPD   . THORACIC AORTIC ANEURYSM   . ABDOM AORTIC ANEURYSM   . VENTRICULAR SEPT DEFECT   . 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 GENERAL OSTEOARTHROSIS   . DIVERTICULITIS OF COLON W/O BLEED   . ROUTINE MEDICAL EXAM   . LOW BACK PAIN(LUMBAGO)     Past Medical History   Diagnosis Date   . AORTIC ANEURYSM NOS      Aortic Aneurysm   . MALIG NEOPLASM DORSAL TONGUE(aka CANCER) 03/29/2002     Ssurgery 1994, T1N1 squamous cell ca L tongue, partial glossectomy; L supraorbital hyoid neck dissection    . TOBACCO USE DISORDER 12/24/2005     Quit 2000.   Marland Kitchen HEARING LOSS NOS 12/24/2005     Bilat hearing aids.   . ECZEMA  12/24/2005     Lower extremities in particular.   Marland Kitchen ERECTILE DYSFUNCTION 12/24/2005   . BENIGN NEOPLASM LG BOWEL 12/24/2005     Tubular adenoma. Dx'ed flex sig 2000--> colonoscopy 2000: N.  F/U 2003: mild diverticulosis, tubular adenoma.  F/U due 2008 with CT colonography.   . IMPAIRED RENAL  FUNCT DISORDERS NEC 07/22/2005     Creat ~ 1.2   . CALCULUS OF KIDNEY 12/24/2005     Lithotripsy 1988   . 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 THORACIC AORTIC ANEURYSM 12/24/2005     Ascending aortic aneurysm.  Followed by Dr. Brunilda Payor, Cardiac surgery.  Gets regular CT scans.  Goal systolic BP: 100-120   . ABDOM AORTIC ANEURYSM 12/24/2005     Borderline, seen on CT 8/06.   Marland Kitchen VENTRICULAR SEPT DEFECT 12/24/2005     Small muscular VSD--NEEDS SBE PROPHYLAXIS. (Clindamycin 600 mg prior to dental visits.)   . HYPERTROPHY PROSTATE W/O OBST 12/24/2005   . GENERAL OSTEOARTHROSIS 01/07/2007     Has seen orthopedics--cortisone shot to L knee helped.   Marland Kitchen DIVERTICULITIS OF COLON W/O BLEED 01/07/2007     Hospitalized at [facility] May 2008     Current Outpatient Prescriptions   Medication Sig   . ASPIRIN 81 MG OR TABS 1 TABLET DAILY   . Atenolol 50 MG Oral Tab TAKE ONE TABLET DAILY   . Benazepril HCl 20 MG OR TABS Take 1 tablet by mouth daily   . Finasteride 5 MG  Oral Tab 1 TABLET DAILY   . Hydrochlorothiazide 25 MG OR TABS Take 1 tablet by mouth every day   . Ipratropium Bromide HFA (ATROVENT HFA) 17 MCG/ACT Inhalation Aero Soln 1-2 puffs twice daily regardless of symptoms   . NIFEdipine 90 MG OR TB24 1 tab po qd   . Simvastatin 40 MG Oral Tab Take 1 tablet by mouth every evening for cholesterol   . Tamsulosin HCl (FLOMAX) 0.4 MG OR CAPS one capsule by mouth at bedtime   . Tiotropium Bromide Monohydrate (SPIRIVA HANDIHALER) 18 MCG Inhalation Cap None Entered     Review of patient's allergies indicates:  Allergies   Allergen Reactions   . Penicillin G      hives           PE; BP 107/58  Pulse 58  Temp(Src) 97.4 F (36.3 C) (Temporal)  Resp 16  Wt 218 lb (98.884 kg)  GEN: NAD, cooperative  Shoulder: Overhead motion causes a clicking. See Dr. Ezekiel Ina note    Injection of Right shoulder    Informed Consent:    The risks and benefits of the procedure were discussed.  Potential benefits include decreased pain, reduced inflammation, and improved  function.  The risks include the small risk of bleeding, a reaction to the medicine, allergy, infection, skin atrophy or hypopigmentation.  The patient was given an opportunity to ask questions.  Both verbal and written consent was obtained before the procedure.      Procedure:  Final verification of correct patient identity, procedure, site/side and instrument was performed prior to start of procedure: YES  Shoulder Glenohumera Injection:  The patient was in the sitting position and a posterolateral approach taken for the injection.  The skin was prepped with Betadine and alcohol, and a small amount of 1% lidocaine infiltrated locally for skin anesthesia using a 30G needle.  Using a 22G needle, a mixture of 4 cc of 1% lidocaine and 1 cc of Kenalog 40 mg/ml was injected easily into the subacromial space.  A sterile bandage was applied.  I was present for the entire procedure and assisted Dr Jones Broom who performed the following parts of the procedure: glenohumeral injection  The patient tolerated the procedure well.        A/P s/p glenohumeral injection (has had AC, glenohumeral, and subacromial injections now)  1. Hopes to gather added pain relief from this injection, but we discussed limits on cortisone injection trials. He has had 3 injections into the R shoulder, but into different potential spaces  2. F/u should symptoms persist or progress    Tracy Alcario Drought, MD

## 2010-08-08 NOTE — Progress Notes (Signed)
CLINIC VISIT - 08/08/2010    Chief Complaint:  Tracy Conway is a 76 year old male seen in clinic for right shoulder pain.    History:  He returns to clinic for consideration of a glenohumeral corticosteroid injection. Radiographs on 05/06/2010 showed AC and GH osteoarthritis. He underwent AC joint injection on 05/23/2010 by Dr. Smith Robert and subacromial injection by Dr. Jonathon Bellows on 07/04/2010. Today he reports the that he received significant improvement in symptoms after the subacromial injection. Specifically, he has decreased nighttime aching. He continues to have baseline aching in the superior/anterior aspect of his shoulder that is exacerbated by any overhead movements. No radiations, numbness, tingling, or weakness. He has also cut down on the weights he is doing at the gym and instead doing increased reps.    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 He now has mild pain in the left shoulder posteriorly with radiations to his elbow and ulnar aspect of his forearm. No weakness. No prior injuries to his left shoulder.      Patient Active Problem List   Diagnoses   . HYPERTENSION NOS(aka HTN)   . PURE HYPERCHOLESTEROLEM   . MALIG NEOPLASM DORSAL TONGUE(aka CANCER)   . Chronic Kidney Disease -- Stage III   . TOBACCO USE DISORDER   . HEARING LOSS NOS   . ECZEMA    . ERECTILE DYSFUNCTION   . BENIGN NEOPLASM LG BOWEL   . CALCULUS OF KIDNEY   . COPD   . THORACIC AORTIC ANEURYSM   . ABDOM AORTIC ANEURYSM   . VENTRICULAR SEPT DEFECT   . 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 GENERAL OSTEOARTHROSIS   . DIVERTICULITIS OF COLON W/O BLEED   . ROUTINE MEDICAL EXAM   . LOW BACK PAIN(LUMBAGO)       Current Outpatient Prescriptions   Medication Sig   . ASPIRIN 81 MG OR TABS 1 TABLET DAILY   . Atenolol 50 MG Oral Tab TAKE ONE TABLET DAILY   . Benazepril HCl 20 MG OR TABS Take 1 tablet by mouth daily   . Finasteride 5 MG Oral Tab 1 TABLET DAILY   .  Hydrochlorothiazide 25 MG OR TABS Take 1 tablet by mouth every day   . Ipratropium Bromide HFA (ATROVENT HFA) 17 MCG/ACT Inhalation Aero Soln 1-2 puffs twice daily regardless of symptoms   . NIFEdipine 90 MG OR TB24 1 tab po qd   . Simvastatin 40 MG Oral Tab Take 1 tablet by mouth every evening for cholesterol   . Tamsulosin HCl (FLOMAX) 0.4 MG OR CAPS one capsule by mouth at bedtime   . Tiotropium Bromide Monohydrate (SPIRIVA HANDIHALER) 18 MCG Inhalation Cap None Entered       Allergies:  Allergen Reaction Noted   . Penicillin g  03/29/2002      Review of Systems:   Const: no fever  MSK: see HPI  Neuro: no numbness/weakness  Psych: no changes in mood    Exam:  Vitals: BP 107/58  Pulse 58  Temp(Src) 97.4 F (36.3 C) (Temporal)  Resp 16  Wt 218 lb (98.884 kg)  Gen: pleasant, no distress  Right shoulder:  -- Inspection: no deformity, no asymmetry, no atrophy  -- Palpation: no tenderness at [facility], AC, of SC joints  -- ROM: abduction to 110 degrees, full cross body adduction, forward flexion to 120 degrees, IR to the T12 level, ER to  75 degrees  -- Strength: 5-/5 with abduction, 5/5 with IR, 5/5 with ER   -- Special tests: empty can test negative for pain (4+/5 strength), Hawkin's test negative, O'Brien's test negative, lift off test test   Left shoulder:  -- Inspection: no deformity, no asymmetry, no atrophy  -- Palpation: no tenderness at [facility], AC, of SC joints  -- ROM: abduction to 130 degrees, full cross body adduction, forward flexion to 145 degrees, IR to the T10 level, ER to 75 degrees  -- Strength: 5/5 with abduction, 5/5 with IR, 5/5 with ER   -- Special tests: empty can test negative for pain (5/5 strength), Hawkin's test negative, O'Brien's test negative, lift off test test     Assessment/Plan:  He has known AC and GH osteoarthritis on the right and had significant improvement in symptoms following the subacromial injection. We discussed the option of holding off on another injection today as he is doing  better but there would not be a contraindication to a GH injection if he desired as he has never had this done before. We reviewed the risks and benefits, as well as the limitation for lifetime injections in a single joint. He elected to have a GH injection as he feels he is experiencing enough symptoms that it would be worthwhile (see procedure note below). As for his new pain in the left shoulder, he likely has a rotator cuff strain but has no localizing pain or weakness on exam. As this has been present only for a couple weeks, we decided to watch and wait to see how his symptoms progress with the expectation that if this is a mild rotator cuff strain it with time. He was instructed to return to clinic as needed for persistent symptoms.    Injection of Right Glenohumeral Joint  Informed Consent:  The risks and benefits of the procedure were discussed.  Potential benefits include decreased pain, reduced inflammation, and improved function.  The risks include the small risk of bleeding, a reaction to the medicine, allergy, infection, skin atrophy or hypopigmentation.  The patient was given an opportunity to ask questions.  Verbal consent was obtained before the procedure.  Procedure:  Final verification of correct patient identity, procedure, site/side and instrument was performed prior to start of procedure.  Glenohumeral Injection: The patient was in the sitting position and a posterolateral approach taken for the injection.  The skin was prepped with Betadine. Using a 22G needle, a mixture of 4 mL of 1% lidocaine and 1 mL of Kenalog 40 mg/ml was injected easily into the glenohumeral space.  A bandage was applied. Dr. Clovia Cuff was present for the entire procedure. The patient tolerated the procedure well.  Follow-up:  Routine post-injection instructions were reviewed.    Delia Heady, MD

## 2010-08-11 ENCOUNTER — Telehealth (INDEPENDENT_AMBULATORY_CARE_PROVIDER_SITE_OTHER): Payer: Self-pay | Admitting: Family Medicine

## 2010-08-11 ENCOUNTER — Encounter (INDEPENDENT_AMBULATORY_CARE_PROVIDER_SITE_OTHER): Payer: Self-pay | Admitting: Family Medicine

## 2010-08-12 ENCOUNTER — Ambulatory Visit: Payer: PRIVATE HEALTH INSURANCE | Attending: Radiation Oncology | Admitting: Radiation Oncology

## 2010-08-12 DIAGNOSIS — C61 Malignant neoplasm of prostate: Secondary | ICD-10-CM | POA: Insufficient documentation

## 2010-08-12 NOTE — Telephone Encounter (Signed)
Dear clinical staff,  Can you get Tracy Conway scheduled for a visit? Thanks  ct

## 2010-08-13 NOTE — Telephone Encounter (Signed)
Pt sched

## 2010-08-14 ENCOUNTER — Ambulatory Visit (INDEPENDENT_AMBULATORY_CARE_PROVIDER_SITE_OTHER): Payer: PRIVATE HEALTH INSURANCE | Admitting: Family Medicine

## 2010-08-14 VITALS — BP 97/59 | HR 56

## 2010-08-14 NOTE — Progress Notes (Signed)
S:  Just diagnosed with prostate cancer. Dr. Rennis Harding, urology  and Dr. Perlie Gold, rad onc.  Is contemplating external beam radiation vs brachytherapy.   He is also seeking a second opinion at the suggestion of his wife, just to make sure that he is comfortable with the proposed treatments.  He has an appointment already set up with an outside urologist.    Here to follow up on his BP control.  He has had several episodes of significant dizziness when trying to install a "[place] closet" in his home.  No obvious palpitations.  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 Was not particularly SOB, no chest pain or other significant symptoms.  The next day 4/18, he too all his meds and bp as low as 68/48, other readings were 80's and 90's/40's.  Exercising every other day--asymptomatic during exercise  Retired--less stress!    Outpatient Prescriptions Prior to Visit   Medication Sig Dispense Refill   . ASPIRIN 81 MG OR TABS 1 TABLET DAILY  90  4   . Atenolol 50 MG Oral Tab TAKE ONE TABLET DAILY  90 Tab  4   . Benazepril HCl 20 MG OR TABS Take 1 tablet by mouth daily  90 Tab  3   . Finasteride 5 MG Oral Tab 1 TABLET DAILY  90 Tab  prn   . Hydrochlorothiazide 25 MG OR TABS Take 1 tablet by mouth every day  90 Tab  prn   . Ipratropium Bromide HFA (ATROVENT HFA) 17 MCG/ACT Inhalation Aero Soln 1-2 puffs twice daily regardless of symptoms       . NIFEdipine 90 MG OR TB24 1 tab po qd  90 Tab  prn   . Simvastatin 40 MG Oral Tab Take 1 tablet by mouth every evening for cholesterol  90 Tab  3   . Tamsulosin HCl (FLOMAX) 0.4 MG OR CAPS one capsule by mouth at bedtime  90 Cap  3   . Tiotropium Bromide Monohydrate (SPIRIVA HANDIHALER) 18 MCG Inhalation Cap None Entered             O:  Pt alert and cooperative and in NAD.  BP 97/59  Pulse 56  SpO2 96%  No problems with  transfers in the office    A/P:  Reviewed all his meds.  Decided to DC the nifedipine. He has been having some ankle swelling and this may be side effect of the calcium channel blocker. He is also up at night with frequent urination, which may be contributed to by the HCTZ, though also perhaps by prostate problems. His pulse is slow and I also considered reducing the atenolol, but he did so well the day that he only took that med.  He does not have DM so therefore his need for an ACE is less.  He will keep me informed via ecare email as to how he is doing.  He will let us know without delay if he has any increases in BP or if he has any worrisome symptoms.    I note that his last ekg in [facility] was in 2008, last echo was in 2009. If this problem persists, consider repeat ekg.    Tana Coast, MD

## 2010-09-04 ENCOUNTER — Other Ambulatory Visit (INDEPENDENT_AMBULATORY_CARE_PROVIDER_SITE_OTHER): Payer: Self-pay | Admitting: Family Medicine

## 2010-09-30 ENCOUNTER — Other Ambulatory Visit (HOSPITAL_BASED_OUTPATIENT_CLINIC_OR_DEPARTMENT_OTHER): Payer: Self-pay | Admitting: Nephrology

## 2010-09-30 ENCOUNTER — Ambulatory Visit: Payer: PRIVATE HEALTH INSURANCE | Attending: Nephrology | Admitting: Nephrology

## 2010-09-30 DIAGNOSIS — N183 Chronic kidney disease, stage 3 unspecified: Secondary | ICD-10-CM | POA: Insufficient documentation

## 2010-09-30 DIAGNOSIS — I10 Essential (primary) hypertension: Secondary | ICD-10-CM | POA: Insufficient documentation

## 2010-09-30 LAB — RENAL FUNCTION PANEL
Albumin: 3.9 g/dL (ref 3.5–5.2)
Anion Gap: 8 (ref 3–11)
Calcium: 10 mg/dL (ref 8.9–10.2)
Carbon Dioxide, Total: 31 mEq/L (ref 22–32)
Chloride: 101 mEq/L (ref 98–108)
Creatinine: 1.75 mg/dL — ABNORMAL HIGH (ref 0.51–1.18)
GFR, Calc, African American: 46 mL/min — ABNORMAL LOW (ref >59)
GFR, Calc, European American: 38 mL/min — ABNORMAL LOW (ref >59)
Glucose: 61 mg/dL — ABNORMAL LOW (ref 62–125)
Phosphate: 4.1 mg/dL (ref 2.5–4.5)
Potassium: 4 mEq/L (ref 3.7–5.2)
Sodium: 140 mEq/L (ref 136–145)
Urea Nitrogen: 28 mg/dL — ABNORMAL HIGH (ref 8–21)

## 2010-09-30 LAB — ALBUMIN/CREATININE RATIO, RANDOM URINE
Albumin (Micro), URN: 4.4 mg/dL
Albumin/Creatinine Ratio, URN: 31.7 mg/g Creat — ABNORMAL HIGH (ref <30)
Creatinine/Unit, URN: 139 mg/dL

## 2010-09-30 LAB — URINALYSIS COMPLETE, URN
Bacteria, URN: NONE SEEN
Bilirubin (Qual), URN: NEGATIVE
Epith Cells_Renal/Trans,URN: NEGATIVE /HPF
Epith Cells_Squamous, URN: NEGATIVE /LPF
Glucose Qual, URN: NEGATIVE mg/dL
Ketones, URN: NEGATIVE mg/dL
Nitrite, URN: NEGATIVE
Occult Blood, URN: NEGATIVE
Protein (Alb Semiquant), URN: NEGATIVE mg/dL
RBC, URN: NEGATIVE /HPF
Specific Gravity, URN: 1.015 g/mL (ref 1.002–1.027)
WBC, URN: NEGATIVE /HPF
pH, URN: 7 (ref 5.0–8.0)

## 2010-09-30 LAB — 1ST EXTRA MISC SPECIMEN

## 2010-09-30 LAB — CBC (HEMOGRAM)
Hematocrit: 40 % (ref 38–50)
Hemoglobin: 13.1 g/dL (ref 13.0–18.0)
MCH: 29.1 pg (ref 27.3–33.6)
MCHC: 32.9 g/dL (ref 32.2–36.5)
MCV: 88 fL (ref 81–98)
Platelet Count: 202 10*3/uL (ref 150–400)
RBC: 4.5 mil/uL (ref 4.40–5.60)
RDW-CV: 14.3 % (ref 11.6–14.4)
WBC: 7.19 10*3/uL (ref 4.3–10.0)

## 2010-09-30 LAB — PROTEIN/CREATININE RATIO, TIMED URINE
Creatinine/Unit, URN: 139 mg/dL
Protein (Total), Urine: 18 mg/dL — ABNORMAL HIGH (ref 0–14)
Protein/Creatinine Ratio: 0.1 (ref <0.2)

## 2010-09-30 LAB — 2ND EXTRA MISC SPECIMEN

## 2010-09-30 LAB — PARATHYROID HORMONE: Parathyroid Hormone: 21 pg/mL (ref 12–88)

## 2010-10-02 LAB — VITAMIN D (25 HYDROXY)
Vit D (25_Hydroxy) Total: 36.8 ng/mL (ref 20.1–50.0)
Vitamin D2 (25_Hydroxy): 9.7 ng/mL
Vitamin D3 (25_Hydroxy): 27.1 ng/mL

## 2010-10-22 ENCOUNTER — Ambulatory Visit: Payer: PRIVATE HEALTH INSURANCE | Attending: Pulmonary Disease | Admitting: Internal Medicine

## 2010-10-22 DIAGNOSIS — J42 Unspecified chronic bronchitis: Secondary | ICD-10-CM | POA: Insufficient documentation

## 2010-10-22 DIAGNOSIS — J984 Other disorders of lung: Secondary | ICD-10-CM | POA: Insufficient documentation

## 2010-10-24 ENCOUNTER — Encounter (INDEPENDENT_AMBULATORY_CARE_PROVIDER_SITE_OTHER): Payer: Self-pay | Admitting: Family Medicine

## 2010-11-02 ENCOUNTER — Encounter (INDEPENDENT_AMBULATORY_CARE_PROVIDER_SITE_OTHER): Payer: Self-pay | Admitting: Family Medicine

## 2010-11-03 ENCOUNTER — Encounter (INDEPENDENT_AMBULATORY_CARE_PROVIDER_SITE_OTHER): Payer: Self-pay | Admitting: Family Medicine

## 2010-11-03 ENCOUNTER — Ambulatory Visit (INDEPENDENT_AMBULATORY_CARE_PROVIDER_SITE_OTHER): Payer: PRIVATE HEALTH INSURANCE | Admitting: Family Medicine

## 2010-11-03 VITALS — BP 106/52 | HR 58 | Temp 97.6°F | Resp 12 | Wt 228.0 lb

## 2010-11-03 MED ORDER — NIFEDIPINE ER 30 MG OR TB24
EXTENDED_RELEASE_TABLET | ORAL | Status: DC
Start: 2010-11-03 — End: 2010-12-30

## 2010-11-03 NOTE — Progress Notes (Signed)
SUBJECTIVE:  Tracy Conway is a 76 year old male here to discuss follow-up of BP since PCP recommended stopping nifedipine. See last OV for details. BP had been ranging 100-110/60 for a few months after stopping nifedipine. Patient had brachytherapy for prostate CA on 10/07/10. Then patient checked BP at [facility] with pulmonary with reading of 155 systolic with subsequent high numbers 130-140's systolic. Patient restarted the nifedipine but BP now are again down to 90/55 which makes patient feel dizzy and with low energy. He is wondering about a lower dose of the nifedipine. He reports chronic ankle edema which worsened while on the nifedipine. It improved when off it now again worsening. No other symptoms at this time.    Patient is followed by Dr. Angelena Sole with [facility] cardiology for ascending aortic aneurysm with recommendation to keep BP below 120 systolic.          Problem List:  Patient Active Problem List   Diagnoses   . HYPERTENSION NOS(aka HTN)   . PURE HYPERCHOLESTEROLEM   . MALIG NEOPLASM DORSAL TONGUE(aka CANCER)   . Chronic Kidney Disease -- Stage III   . TOBACCO USE DISORDER   . HEARING LOSS NOS   . ECZEMA    . ERECTILE DYSFUNCTION   . BENIGN NEOPLASM LG BOWEL   . CALCULUS OF KIDNEY   . COPD   . THORACIC AORTIC ANEURYSM   . ABDOM AORTIC ANEURYSM   . VENTRICULAR SEPT DEFECT   . 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 GENERAL OSTEOARTHROSIS   . DIVERTICULITIS OF COLON W/O BLEED   . ROUTINE MEDICAL EXAM   . LOW BACK PAIN(LUMBAGO)   . MALIGN NEOPL PROSTATE       Pertinent past medical history, med list and allergy history reviewed.    OBJECTIVE:  BP 106/52  Pulse 58  Temp(Src) 97.6 F (36.4 C) (Temporal)  Resp 12  Wt 228 lb (103.42 kg)  SpO2 96%  General: well appearing, in no distress  Heart: nl S1S2, systolic ejection murmur  Ext: 1 + pitting edema          ASSESSMENT/PLAN:     1. Hypertension-now with hypotension with current dose of nifedipine. Will start new dose at 30 mg daily and monitor BP with  recommendations to increase to 60 mg (two tablets) if BP systolic above 120 after 3-5 days on new dose. Follow-up with PCP within one month. See AVS.      Follow up: one month with PCP

## 2010-11-03 NOTE — Patient Instructions (Signed)
Stop nifedipine 90 mg.  Start nifedipine 30 mg tomorrow. Continue to check BP daily at same time after sitting for 30 minutes.  May increase to 60 mg or two tablets if BP higher than 120/80.  Follow-up with PCP in next month.

## 2010-11-12 ENCOUNTER — Inpatient Hospital Stay (HOSPITAL_COMMUNITY): Payer: PRIVATE HEALTH INSURANCE | Admitting: Cardiovascular Disease

## 2010-11-12 ENCOUNTER — Other Ambulatory Visit (EMERGENCY_DEPARTMENT_HOSPITAL): Payer: Self-pay

## 2010-11-12 ENCOUNTER — Other Ambulatory Visit (EMERGENCY_DEPARTMENT_HOSPITAL): Payer: Self-pay | Admitting: Anesthesiology

## 2010-11-12 ENCOUNTER — Inpatient Hospital Stay
Admission: EM | Admit: 2010-11-12 | Discharge: 2010-11-14 | DRG: 853 | Disposition: A | Discharge status: Home / Self Care | Payer: PRIVATE HEALTH INSURANCE | Attending: Cardiovascular Disease | Admitting: Cardiovascular Disease

## 2010-11-12 DIAGNOSIS — I2510 Atherosclerotic heart disease of native coronary artery without angina pectoris: Secondary | ICD-10-CM | POA: Diagnosis present

## 2010-11-12 DIAGNOSIS — J4489 Other specified chronic obstructive pulmonary disease: Secondary | ICD-10-CM | POA: Diagnosis present

## 2010-11-12 DIAGNOSIS — I712 Thoracic aortic aneurysm, without rupture, unspecified: Secondary | ICD-10-CM | POA: Diagnosis present

## 2010-11-12 DIAGNOSIS — Z8546 Personal history of malignant neoplasm of prostate: Secondary | ICD-10-CM

## 2010-11-12 DIAGNOSIS — I129 Hypertensive chronic kidney disease with stage 1 through stage 4 chronic kidney disease, or unspecified chronic kidney disease: Secondary | ICD-10-CM | POA: Diagnosis present

## 2010-11-12 DIAGNOSIS — N183 Chronic kidney disease, stage 3 unspecified: Secondary | ICD-10-CM | POA: Diagnosis present

## 2010-11-12 DIAGNOSIS — E785 Hyperlipidemia, unspecified: Secondary | ICD-10-CM | POA: Diagnosis present

## 2010-11-12 DIAGNOSIS — I214 Non-ST elevation (NSTEMI) myocardial infarction: Principal | ICD-10-CM | POA: Diagnosis present

## 2010-11-12 DIAGNOSIS — Z87442 Personal history of urinary calculi: Secondary | ICD-10-CM

## 2010-11-12 LAB — COMPREHENSIVE METABOLIC PANEL
ALT (GPT): 25 U/L (ref 10–48)
AST (GOT): 60 U/L — ABNORMAL HIGH (ref 15–40)
Albumin: 3.8 g/dL (ref 3.5–5.2)
Alkaline Phosphatase (Total): 46 U/L (ref 36–161)
Anion Gap: 8 (ref 3–11)
Bilirubin (Total): 0.7 mg/dL (ref 0.2–1.3)
Calcium: 10.5 mg/dL — ABNORMAL HIGH (ref 8.9–10.2)
Carbon Dioxide, Total: 27 mEq/L (ref 22–32)
Chloride: 103 mEq/L (ref 98–108)
Creatinine: 1.58 mg/dL — ABNORMAL HIGH (ref 0.51–1.18)
GFR, Calc, African American: 52 mL/min — ABNORMAL LOW (ref >59)
GFR, Calc, European American: 43 mL/min — ABNORMAL LOW (ref >59)
Glucose: 138 mg/dL — ABNORMAL HIGH (ref 62–125)
Potassium: 4.4 mEq/L (ref 3.7–5.2)
Protein (Total): 7.4 g/dL (ref 6.0–8.2)
Sodium: 138 mEq/L (ref 136–145)
Urea Nitrogen: 40 mg/dL — ABNORMAL HIGH (ref 8–21)

## 2010-11-12 LAB — 1ST EXTRA LIME GREEN TOP

## 2010-11-12 LAB — CBC, DIFF
% Basophils: 1 % (ref 0–1)
% Eosinophils: 3 % (ref 0–7)
% Immature Granulocytes: 1 % (ref 0–1)
% Lymphocytes: 20 % (ref 19–53)
% Monocytes: 8 % (ref 5–13)
% Neutrophils: 67 % (ref 34–71)
Absolute Eosinophil Count: 0.31 10*3/uL (ref 0.00–0.50)
Absolute Lymphocyte Count: 1.83 10*3/uL (ref 1.00–4.80)
Basophils: 0.05 10*3/uL (ref 0.00–0.20)
Hematocrit: 40 % (ref 38–50)
Hemoglobin: 13.7 g/dL (ref 13.0–18.0)
Immature Granulocytes: 0.08 10*3/uL — ABNORMAL HIGH (ref 0.00–0.05)
MCH: 29.9 pg (ref 27.3–33.6)
MCHC: 34.1 g/dL (ref 32.2–36.5)
MCV: 88 fL (ref 81–98)
Monocytes: 0.72 10*3/uL (ref 0.00–0.80)
Neutrophils: 6.21 10*3/uL (ref 1.80–7.00)
Platelet Count: 318 10*3/uL (ref 150–400)
RBC: 4.58 mil/uL (ref 4.40–5.60)
RDW-CV: 13.8 % (ref 11.6–14.4)
WBC: 9.2 10*3/uL (ref 4.3–10.0)

## 2010-11-12 LAB — PROTHROMBIN & PTT
Partial Thromboplastin Time: 33 s (ref 22–35)
Prothrombin INR: 1 (ref 0.8–1.3)
Prothrombin Time Patient: 12.5 s (ref 10.7–15.6)

## 2010-11-12 LAB — TROPONIN BY I_STAT (POC): Troponin_I by i Stat (POC): 1.78 ng/mL — ABNORMAL HIGH (ref 0.00–0.39)

## 2010-11-12 LAB — 1ST EXTRA LAVENDER TOP

## 2010-11-12 LAB — 1ST EXTRA RED TOP

## 2010-11-12 LAB — 1ST EXTRA BLUE TOP

## 2010-11-13 ENCOUNTER — Other Ambulatory Visit (HOSPITAL_COMMUNITY): Payer: Self-pay

## 2010-11-13 ENCOUNTER — Other Ambulatory Visit (EMERGENCY_DEPARTMENT_HOSPITAL): Payer: Self-pay

## 2010-11-13 ENCOUNTER — Other Ambulatory Visit (HOSPITAL_COMMUNITY): Payer: Self-pay | Admitting: Neurology

## 2010-11-13 ENCOUNTER — Ambulatory Visit (HOSPITAL_BASED_OUTPATIENT_CLINIC_OR_DEPARTMENT_OTHER): Payer: PRIVATE HEALTH INSURANCE

## 2010-11-13 ENCOUNTER — Other Ambulatory Visit (HOSPITAL_BASED_OUTPATIENT_CLINIC_OR_DEPARTMENT_OTHER): Payer: Self-pay | Admitting: Cardiovascular Disease

## 2010-11-13 LAB — CHEST PAIN REFLEXIVE TESTING

## 2010-11-13 LAB — BASIC METABOLIC PANEL
Anion Gap: 7 (ref 3–11)
Calcium: 9.4 mg/dL (ref 8.9–10.2)
Carbon Dioxide, Total: 25 mEq/L (ref 22–32)
Chloride: 104 mEq/L (ref 98–108)
Creatinine: 1.43 mg/dL — ABNORMAL HIGH (ref 0.51–1.18)
GFR, Calc, African American: 59 mL/min — ABNORMAL LOW (ref >59)
GFR, Calc, European American: 48 mL/min — ABNORMAL LOW (ref >59)
Glucose: 134 mg/dL — ABNORMAL HIGH (ref 62–125)
Potassium: 3.8 mEq/L (ref 3.7–5.2)
Sodium: 136 mEq/L (ref 136–145)
Urea Nitrogen: 36 mg/dL — ABNORMAL HIGH (ref 8–21)

## 2010-11-13 LAB — CBC, DIFF
% Basophils: 0 % (ref 0–1)
% Eosinophils: 2 % (ref 0–7)
% Immature Granulocytes: 0 % (ref 0–1)
% Lymphocytes: 9 % — ABNORMAL LOW (ref 19–53)
% Monocytes: 7 % (ref 5–13)
% Neutrophils: 82 % — ABNORMAL HIGH (ref 34–71)
Absolute Eosinophil Count: 0.23 10*3/uL (ref 0.00–0.50)
Absolute Lymphocyte Count: 0.96 10*3/uL — ABNORMAL LOW (ref 1.00–4.80)
Basophils: 0.03 10*3/uL (ref 0.00–0.20)
Hematocrit: 35 % — ABNORMAL LOW (ref 38–50)
Hemoglobin: 11.5 g/dL — ABNORMAL LOW (ref 13.0–18.0)
Immature Granulocytes: 0.04 10*3/uL (ref 0.00–0.05)
MCH: 28.5 pg (ref 27.3–33.6)
MCHC: 33.1 g/dL (ref 32.2–36.5)
MCV: 86 fL (ref 81–98)
Monocytes: 0.75 10*3/uL (ref 0.00–0.80)
Neutrophils: 8.6 10*3/uL — ABNORMAL HIGH (ref 1.80–7.00)
Platelet Count: 254 10*3/uL (ref 150–400)
RBC: 4.04 mil/uL — ABNORMAL LOW (ref 4.40–5.60)
RDW-CV: 14.1 % (ref 11.6–14.4)
WBC: 10.61 10*3/uL — ABNORMAL HIGH (ref 4.3–10.0)

## 2010-11-13 LAB — CK MB MASS & QUOTIENT
CK MB Mass: 285 ng/mL — ABNORMAL HIGH (ref 0–5)
CK MB Mass: 147 ng/mL — ABNORMAL HIGH (ref 0–5)
CK MB Quotient: 9 — ABNORMAL HIGH (ref 0–3)
CK MB Quotient: 6 — ABNORMAL HIGH (ref 0–3)

## 2010-11-13 LAB — CBC (HEMOGRAM)
Hematocrit: 36 % — ABNORMAL LOW (ref 38–50)
Hemoglobin: 12 g/dL — ABNORMAL LOW (ref 13.0–18.0)
MCH: 28.8 pg (ref 27.3–33.6)
MCHC: 33.3 g/dL (ref 32.2–36.5)
MCV: 86 fL (ref 81–98)
Platelet Count: 269 10*3/uL (ref 150–400)
RBC: 4.17 mil/uL — ABNORMAL LOW (ref 4.40–5.60)
RDW-CV: 14.2 % (ref 11.6–14.4)
WBC: 9.63 10*3/uL (ref 4.3–10.0)

## 2010-11-13 LAB — PROTHROMBIN TIME
Prothrombin INR: 1.1 (ref 0.8–1.3)
Prothrombin Time Patient: 14 s (ref 10.7–15.6)

## 2010-11-13 LAB — TROPONIN_I
Troponin_I: 18.5 ng/mL — ABNORMAL HIGH (ref 0.00–0.39)
Troponin_I: 175.2 ng/mL — ABNORMAL HIGH (ref 0.00–0.39)
Troponin_I: 64.9 ng/mL — ABNORMAL HIGH (ref 0.00–0.39)

## 2010-11-13 LAB — TROPONIN BY I_STAT (POC): Troponin_I by i Stat (POC): 11.15 ng/mL — ABNORMAL HIGH (ref 0.00–0.39)

## 2010-11-13 LAB — PARTIAL THROMBOPLASTIN TIME: Partial Thromboplastin Time: 106 s — ABNORMAL HIGH (ref 22–35)

## 2010-11-13 LAB — CK, CREATINE KINASE, TOTAL ACTIVITY
Creatinine Kinase Total Activity: 3225 U/L — ABNORMAL HIGH (ref 30–285)
Creatinine Kinase Total Activity: 2268 U/L — ABNORMAL HIGH (ref 30–285)

## 2010-11-14 ENCOUNTER — Other Ambulatory Visit (HOSPITAL_COMMUNITY): Payer: Self-pay

## 2010-11-14 ENCOUNTER — Other Ambulatory Visit (HOSPITAL_COMMUNITY): Payer: Self-pay | Admitting: Neurology

## 2010-11-14 ENCOUNTER — Other Ambulatory Visit (HOSPITAL_COMMUNITY): Payer: Self-pay | Admitting: Cardiovascular Disease

## 2010-11-14 LAB — CBC (HEMOGRAM)

## 2010-11-14 LAB — CK MB MASS & QUOTIENT
CK MB Mass: 49 ng/mL — ABNORMAL HIGH (ref 0–5)
CK MB Mass: 33 ng/mL — ABNORMAL HIGH (ref 0–5)
CK MB Quotient: 3 (ref 0–3)
CK MB Quotient: 4 — ABNORMAL HIGH (ref 0–3)

## 2010-11-14 LAB — PARTIAL THROMBOPLASTIN TIME: Partial Thromboplastin Time: 36 s — ABNORMAL HIGH (ref 22–35)

## 2010-11-14 LAB — BASIC METABOLIC PANEL
Anion Gap: 8 (ref 3–11)
Calcium: 8.9 mg/dL (ref 8.9–10.2)
Carbon Dioxide, Total: 27 mEq/L (ref 22–32)
Chloride: 99 mEq/L (ref 98–108)
Creatinine: 1.6 mg/dL — ABNORMAL HIGH (ref 0.51–1.18)
GFR, Calc, African American: 51 mL/min — ABNORMAL LOW (ref >59)
GFR, Calc, European American: 42 mL/min — ABNORMAL LOW (ref >59)
Glucose: 116 mg/dL (ref 62–125)
Potassium: 4 mEq/L (ref 3.7–5.2)
Sodium: 134 mEq/L — ABNORMAL LOW (ref 136–145)
Urea Nitrogen: 28 mg/dL — ABNORMAL HIGH (ref 8–21)

## 2010-11-14 LAB — TROPONIN_I
Troponin_I: 67.4 ng/mL — ABNORMAL HIGH (ref 0.00–0.39)
Troponin_I: 40.47 ng/mL — ABNORMAL HIGH (ref 0.00–0.39)

## 2010-11-14 LAB — CBC, DIFF
% Basophils: 0 % (ref 0–1)
% Eosinophils: 1 % (ref 0–7)
% Immature Granulocytes: 0 % (ref 0–1)
% Lymphocytes: 9 % — ABNORMAL LOW (ref 19–53)
% Monocytes: 11 % (ref 5–13)
% Neutrophils: 79 % — ABNORMAL HIGH (ref 34–71)
Absolute Eosinophil Count: 0.11 10*3/uL (ref 0.00–0.50)
Absolute Lymphocyte Count: 1.14 10*3/uL (ref 1.00–4.80)
Basophils: 0.02 10*3/uL (ref 0.00–0.20)
Hematocrit: 36 % — ABNORMAL LOW (ref 38–50)
Hemoglobin: 12.3 g/dL — ABNORMAL LOW (ref 13.0–18.0)
Immature Granulocytes: 0.04 10*3/uL (ref 0.00–0.05)
MCH: 29.3 pg (ref 27.3–33.6)
MCHC: 33.8 g/dL (ref 32.2–36.5)
MCV: 87 fL (ref 81–98)
Monocytes: 1.34 10*3/uL — ABNORMAL HIGH (ref 0.00–0.80)
Neutrophils: 9.74 10*3/uL — ABNORMAL HIGH (ref 1.80–7.00)
Platelet Count: 249 10*3/uL (ref 150–400)
RBC: 4.2 mil/uL — ABNORMAL LOW (ref 4.40–5.60)
RDW-CV: 13.8 % (ref 11.6–14.4)
WBC: 12.39 10*3/uL — ABNORMAL HIGH (ref 4.3–10.0)

## 2010-11-14 LAB — CK, CREATINE KINASE, TOTAL ACTIVITY
Creatinine Kinase Total Activity: 1417 U/L — ABNORMAL HIGH (ref 30–285)
Creatinine Kinase Total Activity: 938 U/L — ABNORMAL HIGH (ref 30–285)

## 2010-11-18 ENCOUNTER — Other Ambulatory Visit (HOSPITAL_BASED_OUTPATIENT_CLINIC_OR_DEPARTMENT_OTHER): Payer: Self-pay | Admitting: Neurology

## 2010-11-18 ENCOUNTER — Ambulatory Visit: Payer: PRIVATE HEALTH INSURANCE | Attending: Cardiovascular Disease | Admitting: Cardiovascular Disease

## 2010-11-18 DIAGNOSIS — I214 Non-ST elevation (NSTEMI) myocardial infarction: Secondary | ICD-10-CM | POA: Insufficient documentation

## 2010-11-18 DIAGNOSIS — I219 Acute myocardial infarction, unspecified: Secondary | ICD-10-CM | POA: Insufficient documentation

## 2010-11-18 DIAGNOSIS — R079 Chest pain, unspecified: Secondary | ICD-10-CM | POA: Insufficient documentation

## 2010-11-18 LAB — BASIC METABOLIC PANEL
Anion Gap: 9 (ref 3–11)
Calcium: 10 mg/dL (ref 8.9–10.2)
Carbon Dioxide, Total: 24 mEq/L (ref 22–32)
Chloride: 105 mEq/L (ref 98–108)
Creatinine: 1.84 mg/dL — ABNORMAL HIGH (ref 0.51–1.18)
GFR, Calc, African American: 44 mL/min — ABNORMAL LOW (ref >59)
GFR, Calc, European American: 36 mL/min — ABNORMAL LOW (ref >59)
Glucose: 106 mg/dL (ref 62–125)
Potassium: 4.5 mEq/L (ref 3.7–5.2)
Sodium: 138 mEq/L (ref 136–145)
Urea Nitrogen: 42 mg/dL — ABNORMAL HIGH (ref 8–21)

## 2010-11-19 ENCOUNTER — Other Ambulatory Visit (HOSPITAL_BASED_OUTPATIENT_CLINIC_OR_DEPARTMENT_OTHER): Payer: PRIVATE HEALTH INSURANCE

## 2010-11-19 ENCOUNTER — Encounter (HOSPITAL_BASED_OUTPATIENT_CLINIC_OR_DEPARTMENT_OTHER): Payer: PRIVATE HEALTH INSURANCE | Admitting: Internal Medicine

## 2010-11-20 ENCOUNTER — Other Ambulatory Visit (INDEPENDENT_AMBULATORY_CARE_PROVIDER_SITE_OTHER): Payer: Self-pay | Admitting: Family Medicine

## 2010-11-21 MED ORDER — FINASTERIDE 5 MG OR TABS
ORAL_TABLET | ORAL | Status: DC
Start: 2010-11-20 — End: 2011-07-15

## 2010-11-26 IMAGING — US Venous
1 series · 14 of 16 positions shown · non-contrast
Comparison: none

[Series 1: venous · 0.11mm/px · 14 of 31 slices shown]
[im 1/31]
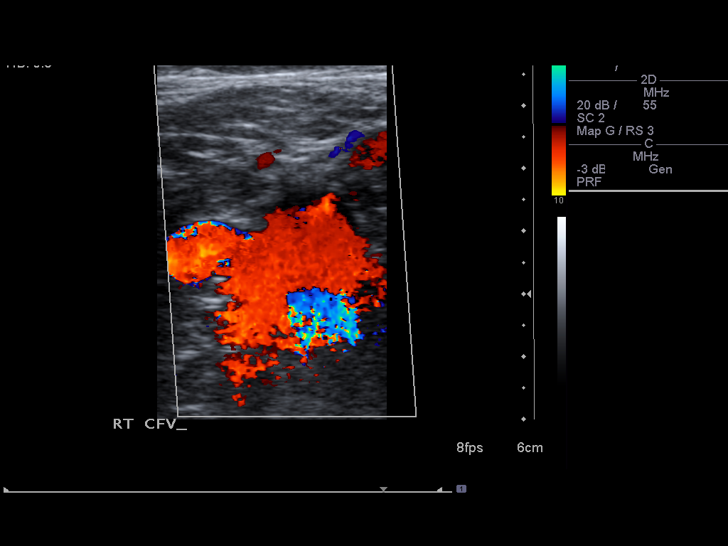
[im 3/31]
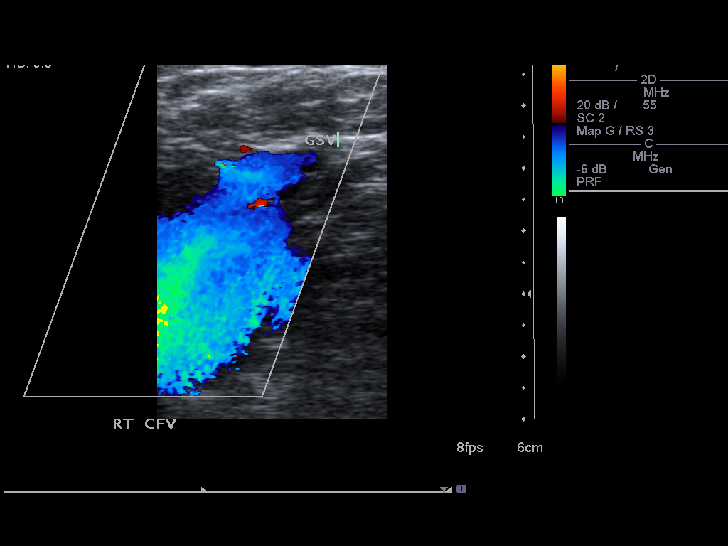
[im 5/31]
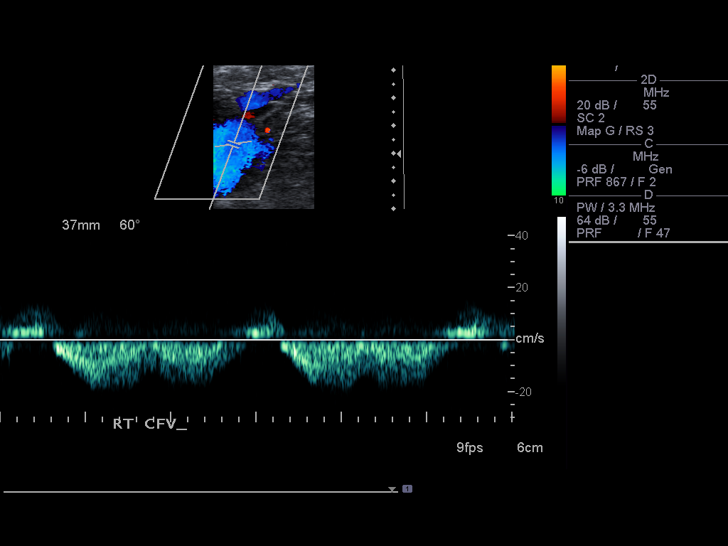
[im 9/31]
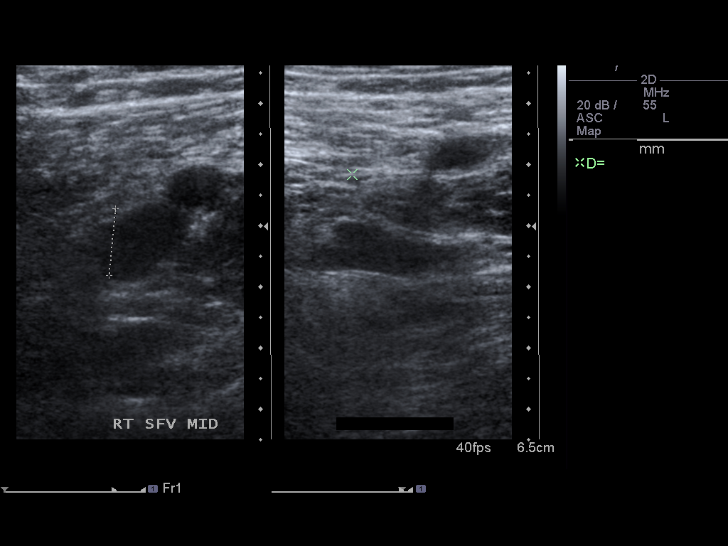
[im 11/31]
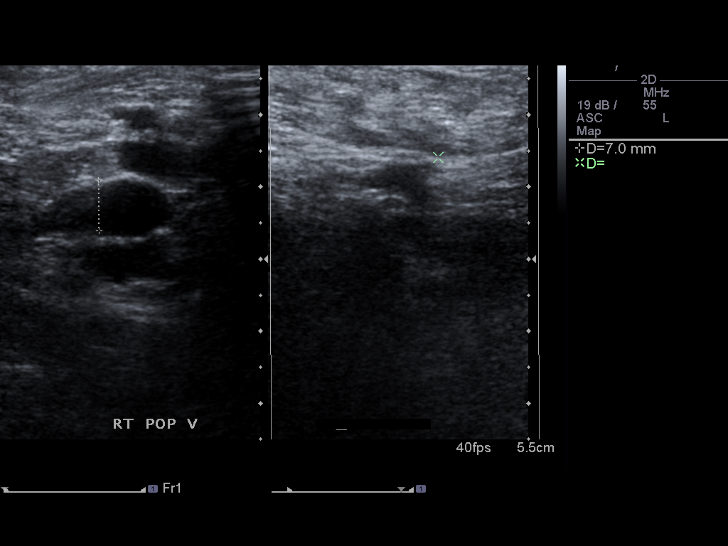
[im 13/31]
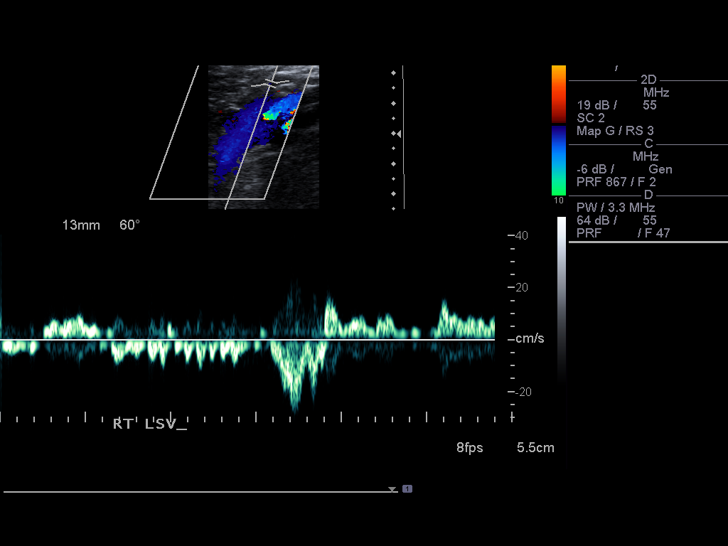
[im 15/31]
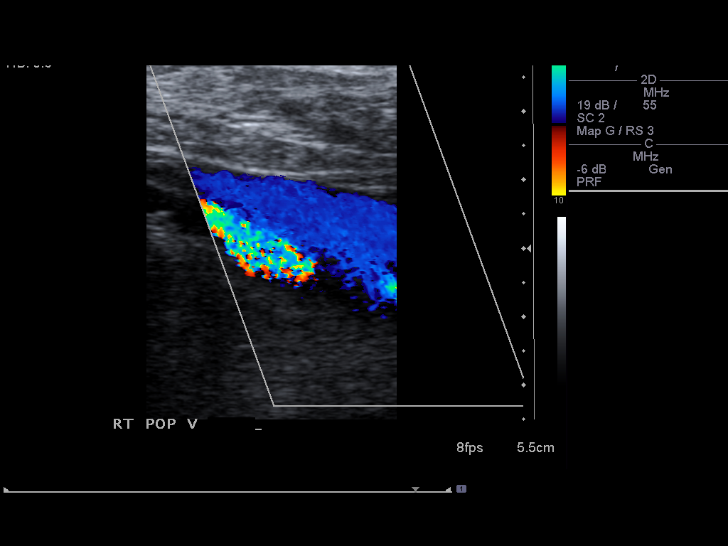
[im 17/31]
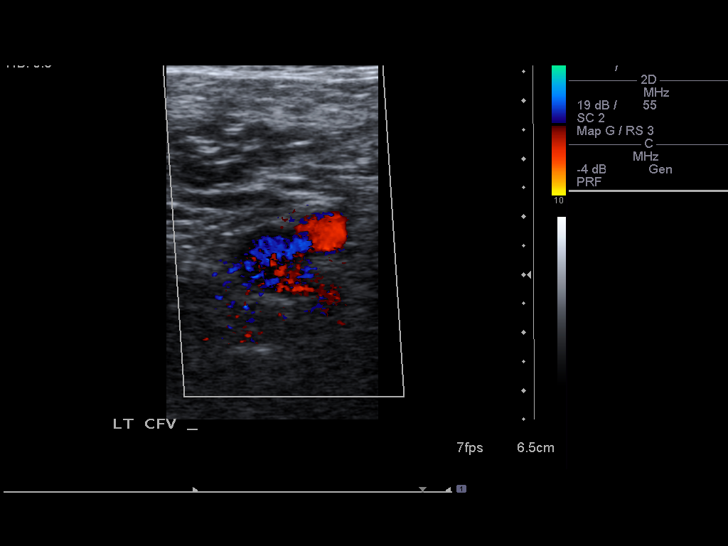
[im 19/31]
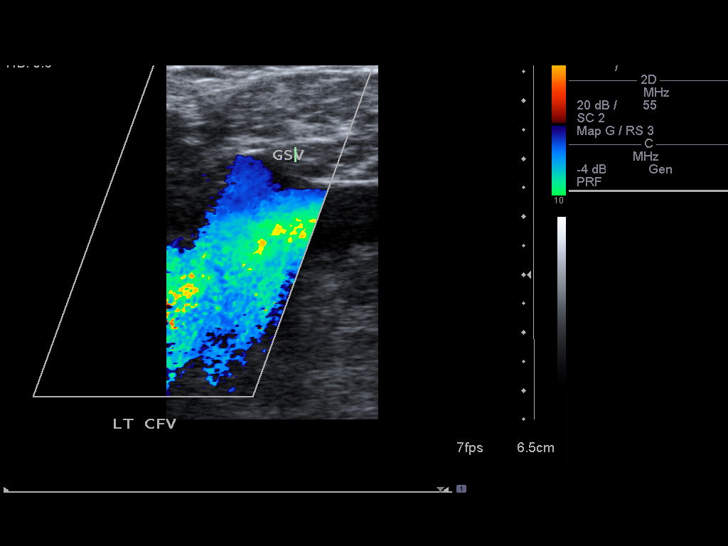
[im 21/31]
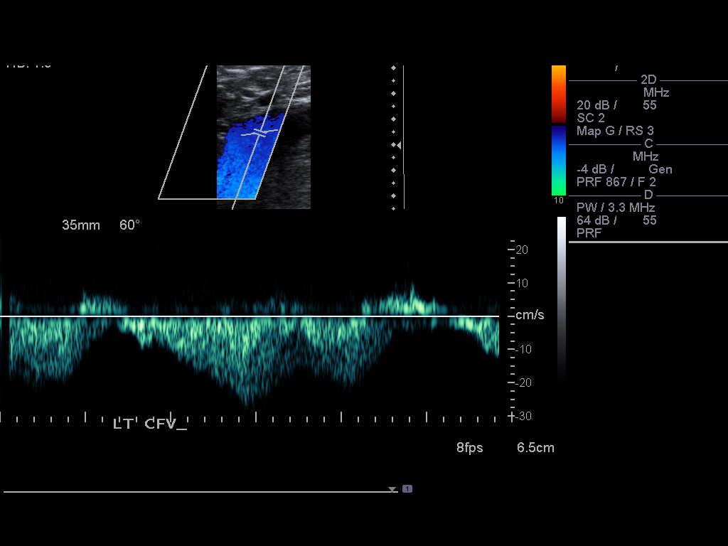
[im 25/31]
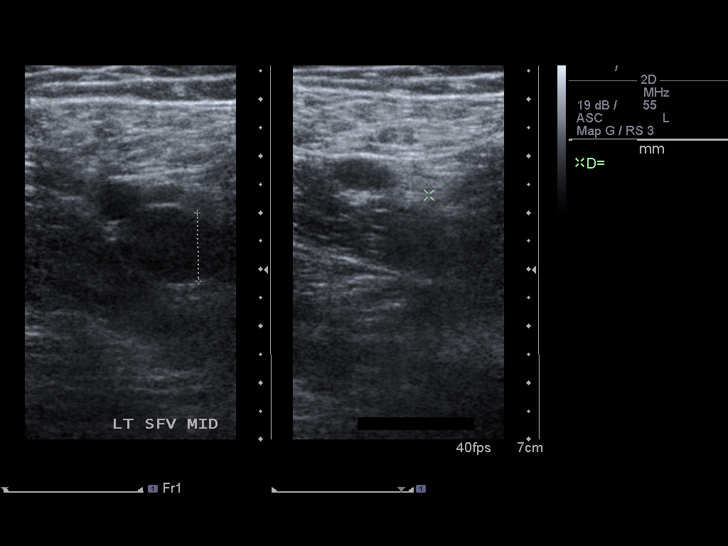
[im 27/31]
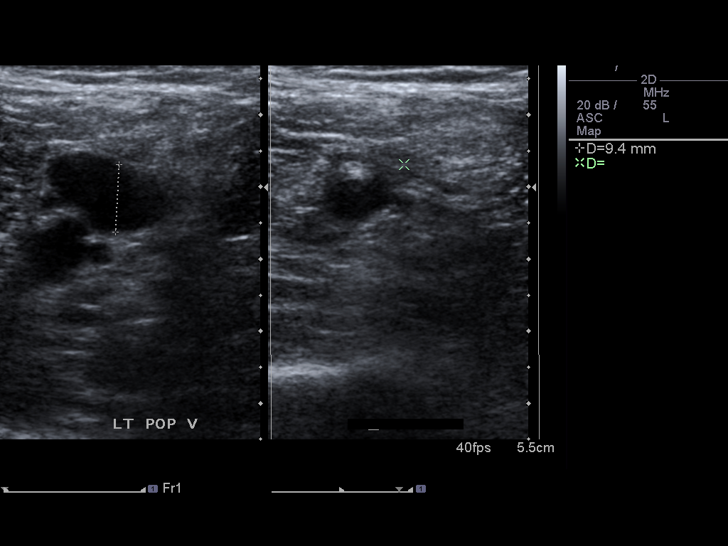
[im 29/31]
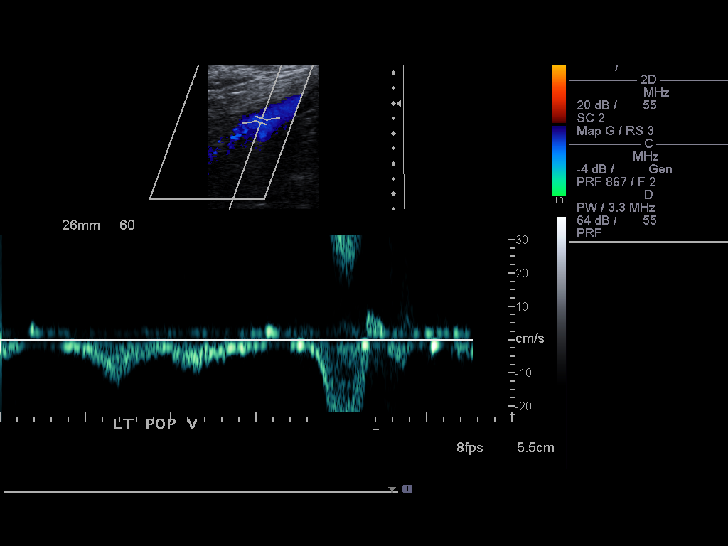
[im 31/31]
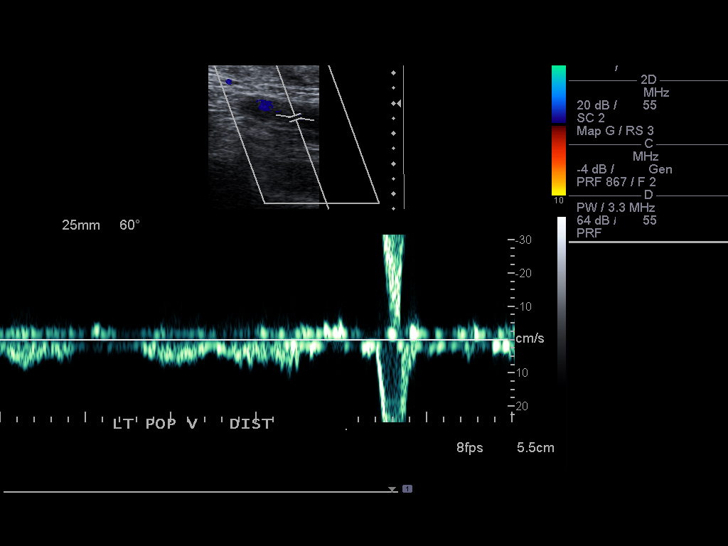

[14 of 16 positions shown; findings below may reference images not displayed]

Clinical statementPain 

Grey scale and Doppler ultrasound of the bilateral lower extremity 

deep venous system was performed.  No comparisons.

There is good compression of the veins. 

There is good augmentation of waveforms.  

There is no evidence of intraluminal filling defect.  

There is no evidence of acute DVT.

Impression No evidence of acute DVT in either lower extremity.

## 2010-12-04 ENCOUNTER — Ambulatory Visit: Payer: PRIVATE HEALTH INSURANCE | Attending: Cardiovascular Disease

## 2010-12-04 DIAGNOSIS — I2119 ST elevation (STEMI) myocardial infarction involving other coronary artery of inferior wall: Secondary | ICD-10-CM | POA: Insufficient documentation

## 2010-12-10 ENCOUNTER — Other Ambulatory Visit (HOSPITAL_BASED_OUTPATIENT_CLINIC_OR_DEPARTMENT_OTHER): Payer: Self-pay | Admitting: Pulmonary Disease

## 2010-12-10 ENCOUNTER — Ambulatory Visit (HOSPITAL_BASED_OUTPATIENT_CLINIC_OR_DEPARTMENT_OTHER): Payer: PRIVATE HEALTH INSURANCE

## 2010-12-10 ENCOUNTER — Ambulatory Visit: Payer: PRIVATE HEALTH INSURANCE | Attending: Pulmonary Disease | Admitting: Internal Medicine

## 2010-12-10 DIAGNOSIS — J984 Other disorders of lung: Secondary | ICD-10-CM | POA: Insufficient documentation

## 2010-12-10 DIAGNOSIS — J4489 Other specified chronic obstructive pulmonary disease: Secondary | ICD-10-CM | POA: Insufficient documentation

## 2010-12-10 DIAGNOSIS — R918 Other nonspecific abnormal finding of lung field: Secondary | ICD-10-CM | POA: Insufficient documentation

## 2010-12-11 ENCOUNTER — Other Ambulatory Visit (INDEPENDENT_AMBULATORY_CARE_PROVIDER_SITE_OTHER): Payer: Self-pay | Admitting: Family Medicine

## 2010-12-11 LAB — PR DIAGNOSTIC COMPUTED TOMOGRAPHY THORAX W/O CNTRST

## 2010-12-12 ENCOUNTER — Other Ambulatory Visit (INDEPENDENT_AMBULATORY_CARE_PROVIDER_SITE_OTHER): Payer: Self-pay | Admitting: Family Medicine

## 2010-12-15 ENCOUNTER — Ambulatory Visit: Payer: PRIVATE HEALTH INSURANCE | Attending: Cardiovascular Disease | Admitting: Cardiovascular Disease

## 2010-12-15 DIAGNOSIS — I219 Acute myocardial infarction, unspecified: Secondary | ICD-10-CM | POA: Insufficient documentation

## 2010-12-15 DIAGNOSIS — I712 Thoracic aortic aneurysm, without rupture, unspecified: Secondary | ICD-10-CM | POA: Insufficient documentation

## 2010-12-15 DIAGNOSIS — E785 Hyperlipidemia, unspecified: Secondary | ICD-10-CM | POA: Insufficient documentation

## 2010-12-15 DIAGNOSIS — I209 Angina pectoris, unspecified: Secondary | ICD-10-CM | POA: Insufficient documentation

## 2010-12-15 DIAGNOSIS — I10 Essential (primary) hypertension: Secondary | ICD-10-CM | POA: Insufficient documentation

## 2010-12-15 DIAGNOSIS — J4489 Other specified chronic obstructive pulmonary disease: Secondary | ICD-10-CM | POA: Insufficient documentation

## 2010-12-15 MED ORDER — TAMSULOSIN HCL 0.4 MG OR CAPS
ORAL_CAPSULE | ORAL | Status: DC
Start: 2010-12-11 — End: 2011-08-04

## 2010-12-17 ENCOUNTER — Other Ambulatory Visit (HOSPITAL_BASED_OUTPATIENT_CLINIC_OR_DEPARTMENT_OTHER): Payer: PRIVATE HEALTH INSURANCE

## 2010-12-17 ENCOUNTER — Encounter (HOSPITAL_BASED_OUTPATIENT_CLINIC_OR_DEPARTMENT_OTHER): Payer: PRIVATE HEALTH INSURANCE | Admitting: Internal Medicine

## 2010-12-30 ENCOUNTER — Encounter (INDEPENDENT_AMBULATORY_CARE_PROVIDER_SITE_OTHER): Payer: Self-pay | Admitting: Family Medicine

## 2010-12-30 ENCOUNTER — Ambulatory Visit (INDEPENDENT_AMBULATORY_CARE_PROVIDER_SITE_OTHER): Payer: PRIVATE HEALTH INSURANCE | Admitting: Family Medicine

## 2010-12-30 VITALS — BP 98/56 | HR 56 | Temp 97.5°F | Wt 222.5 lb

## 2010-12-30 NOTE — Progress Notes (Signed)
---------------------------------------------    I have reviewed the resident's note and agree with the findings, assessment and plan documented in the note.  Attending: Charnika Herbst Elena Kortez Murtagh, MD  ---------------------------------------------

## 2010-12-30 NOTE — Progress Notes (Signed)
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 Aortic aneurysm of unspecified site without mention of rupture      Aortic Aneurysm   . MALIG NEOPLASM DORSAL TONGUE(aka CANCER) 03/29/2002     Ssurgery 1994, T1N1 squamous cell ca L tongue, partial glossectomy; L supraorbital hyoid neck dissection    . Tobacco use disorder 12/24/2005     Quit 2000.   Marland Kitchen Unspecified hearing loss 12/24/2005     Bilat hearing aids.   . ECZEMA  12/24/2005     Lower extremities in particular.   Marland Kitchen ERECTILE DYSFUNCTION 12/24/2005   . Benign neoplasm of colon 12/24/2005     Tubular adenoma. Dx'ed flex sig 2000--> colonoscopy 2000: N.  F/U 2003: mild diverticulosis, tubular adenoma.  F/U due 2008 with CT colonography.   . Other specified disorders resulting from impaired renal function 07/22/2005     Creat ~ 1.2   . Calculus of kidney 12/24/2005     Lithotripsy 1988   . 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 Thoracic aneurysm without mention of rupture 12/24/2005     Ascending aortic aneurysm.  Followed by Dr. Brunilda Payor, Cardiac surgery.  Gets regular CT scans.  Goal systolic BP: 100-120   . Abdominal aneurysm without mention of rupture 12/24/2005     Borderline, seen on CT 8/06.   . Ventricular septal defect 12/24/2005     Small muscular VSD--NEEDS SBE PROPHYLAXIS. (Clindamycin 600 mg prior to dental visits.)   . Hypertrophy of prostate without urinary obstruction and other lower urinary tract symptoms (LUTS) 12/24/2005   . Generalized osteoarthrosis, unspecified site 01/07/2007     Has seen orthopedics--cortisone shot to L knee helped.   . Diverticulitis of colon (without mention of hemorrhage) 01/07/2007     Hospitalized at [facility] May 2008     Prostate CA  3months ago had seed implants      MED  Outpatient Prescriptions Prior to Visit   Medication Sig Dispense Refill   . ASPIRIN 81 MG OR TABS 1 TABLET DAILY  90  4   . Atenolol 50 MG Oral Tab TAKE ONE TABLET DAILY  90 Tab  4   . Benazepril HCl 20 MG OR TABS Take 1 tablet by mouth daily  90 Tab  3   . Finasteride 5 MG Oral Tab TAKE ONE TABLET DAILY  90 Tab  PRN   . Hydrochlorothiazide 25 MG OR TABS Take 1 tablet by mouth every day  90 Tab  prn   . NIFEdipine 30 MG Oral TABLET SR 24 HR one tablet daily  30 Tab  1   . Salmeterol Xinafoate (SEREVENT DISKUS) 50 MCG/DOSE Inhalation AEROSOL POWDER, BREATH ACTIVATED 1 inh bid       . Simvastatin 40 MG Oral Tab Take 1 tablet by mouth every evening for cholesterol  90 Tab  3   . Tamsulosin HCl 0.4 MG Oral Cap TAKE 1 CAPSULE BY MOUTH AT BEDTIME (Make an appointment)  90 Cap  0   . Tiotropium  Bromide Monohydrate (SPIRIVA HANDIHALER) 18 MCG Inhalation Cap None Entered       Med list updated    SOCIAL  Retired Naval architect  Works at host at [employer]  Exercised regularly after emphysema Dx; ~2 years    REVIEW of systems    CONSTITUTIONAL: constipation after seed implants, better with milk of magnesia. Weight steady  NEUROLOGIC: negative  OPHTHALMIC: negative.  Recent eye exam, no changes  ENT: hoarse, sore throat  RESPIRATORY: negative; Emphysema at baseline, exercises w/o increasing dyspnea  CARDIOVASCULAR: MI with stents in circumflex artery  GI: constipation after palladium seed  GENITOURINARY: negative.  ENDOCRINE: negative    OBJECTIVE  VITALS  Filed Vitals:    12/30/10 1023   BP: 98/56   Pulse: 56   Temp: 97.5 F (36.4 C)   TempSrc: Temporal   Weight: 222 lb 8 oz (100.925 kg)   SpO2: 95%     GEN - Well-appearing, No distress, wearing mask  HEENT - Eyes EOMI, conjunctiva clear, OP benign w/moist mucous membranes, no injection or erythma, neck supple w/ full range of motion, no palpable masses, surgical scar in left neck  post. to sternocleidomastoid muscle with decreased fullness to palp in this area compared to right neck.    CHEST - Heart regular rate w/o murmurs, Lungs w/slight crackles in left lower lobe anterior, otherwise clear in all fields   EXTREMITIES - moves well x 4. No pedal edema  SKIN - no rashes or skin breakdown.  NEURO - AA&Ox4    ASSESSMENT/PLAN  Tracy Conway is a 76 year old male     784.1 Throat pain  (primary encounter diagnosis)  Comment: Mostly likely mild URI, based on Hx and Dx.  Concern for malignancy low; improved with conservative treatment, tongue CA was 1994.  Plan:   - Continue conservative care  - check with patient in one week to make sure symptoms have resolved  - ENT referral if symptoms not resolved.

## 2010-12-30 NOTE — Progress Notes (Signed)
-------------------------------------------    Attending: Tana Coast, MD  I discussed this patient's history and physical findings with the resident, as well as the plan for this patient.   Key findings based upon this discussion:  Sore throat, hoarseness. No F. Exam not suspicious. Observe for now. RTC prn.  -------------------------------------------

## 2011-01-05 ENCOUNTER — Telehealth (INDEPENDENT_AMBULATORY_CARE_PROVIDER_SITE_OTHER): Payer: Self-pay | Admitting: Family Medicine

## 2011-01-05 NOTE — Telephone Encounter (Signed)
Per Dr. Judith Blonder I spoke with the patient his voice came back on Friday and his throat is not sore as of today. Pt feels good over all.

## 2011-01-26 ENCOUNTER — Other Ambulatory Visit (HOSPITAL_BASED_OUTPATIENT_CLINIC_OR_DEPARTMENT_OTHER): Payer: Self-pay | Admitting: Cardiovascular Disease

## 2011-01-26 ENCOUNTER — Ambulatory Visit: Payer: PRIVATE HEALTH INSURANCE | Attending: Cardiovascular Disease | Admitting: Cardiovascular Disease

## 2011-01-26 ENCOUNTER — Encounter (INDEPENDENT_AMBULATORY_CARE_PROVIDER_SITE_OTHER): Payer: Self-pay | Admitting: Family Medicine

## 2011-01-26 DIAGNOSIS — I252 Old myocardial infarction: Secondary | ICD-10-CM | POA: Insufficient documentation

## 2011-01-26 DIAGNOSIS — J4489 Other specified chronic obstructive pulmonary disease: Secondary | ICD-10-CM | POA: Insufficient documentation

## 2011-01-26 DIAGNOSIS — N19 Unspecified kidney failure: Secondary | ICD-10-CM | POA: Insufficient documentation

## 2011-01-26 DIAGNOSIS — I2510 Atherosclerotic heart disease of native coronary artery without angina pectoris: Secondary | ICD-10-CM | POA: Insufficient documentation

## 2011-01-26 DIAGNOSIS — E785 Hyperlipidemia, unspecified: Secondary | ICD-10-CM | POA: Insufficient documentation

## 2011-01-26 DIAGNOSIS — I10 Essential (primary) hypertension: Secondary | ICD-10-CM | POA: Insufficient documentation

## 2011-01-26 LAB — BASIC METABOLIC PANEL
Anion Gap: 8 (ref 3–11)
Calcium: 9.9 mg/dL (ref 8.9–10.2)
Carbon Dioxide, Total: 24 mEq/L (ref 22–32)
Chloride: 106 mEq/L (ref 98–108)
Creatinine: 1.92 mg/dL — ABNORMAL HIGH (ref 0.51–1.18)
GFR, Calc, African American: 42 mL/min — ABNORMAL LOW (ref >59)
GFR, Calc, European American: 34 mL/min — ABNORMAL LOW (ref >59)
Glucose: 92 mg/dL (ref 62–125)
Potassium: 4.4 mEq/L (ref 3.7–5.2)
Sodium: 138 mEq/L (ref 136–145)
Urea Nitrogen: 34 mg/dL — ABNORMAL HIGH (ref 8–21)

## 2011-01-26 NOTE — Telephone Encounter (Signed)
Immunization documented

## 2011-01-27 ENCOUNTER — Encounter (INDEPENDENT_AMBULATORY_CARE_PROVIDER_SITE_OTHER): Payer: Self-pay | Admitting: Family Medicine

## 2011-02-24 ENCOUNTER — Ambulatory Visit: Payer: PRIVATE HEALTH INSURANCE | Attending: Cardiovascular Disease | Admitting: Cardiovascular Disease

## 2011-02-24 ENCOUNTER — Other Ambulatory Visit (HOSPITAL_BASED_OUTPATIENT_CLINIC_OR_DEPARTMENT_OTHER): Payer: Self-pay | Admitting: Cardiovascular Disease

## 2011-02-24 DIAGNOSIS — I209 Angina pectoris, unspecified: Secondary | ICD-10-CM | POA: Insufficient documentation

## 2011-02-24 DIAGNOSIS — E785 Hyperlipidemia, unspecified: Secondary | ICD-10-CM | POA: Insufficient documentation

## 2011-02-24 DIAGNOSIS — N19 Unspecified kidney failure: Secondary | ICD-10-CM | POA: Insufficient documentation

## 2011-02-24 DIAGNOSIS — I252 Old myocardial infarction: Secondary | ICD-10-CM | POA: Insufficient documentation

## 2011-02-24 DIAGNOSIS — J4489 Other specified chronic obstructive pulmonary disease: Secondary | ICD-10-CM | POA: Insufficient documentation

## 2011-02-24 DIAGNOSIS — I10 Essential (primary) hypertension: Secondary | ICD-10-CM | POA: Insufficient documentation

## 2011-02-24 LAB — CBC, DIFF
% Basophils: 0 % (ref 0–1)
% Eosinophils: 0 % (ref 0–7)
% Immature Granulocytes: 0 % (ref 0–1)
% Lymphocytes: 30 % (ref 19–53)
% Monocytes: 12 % (ref 5–13)
% Neutrophils: 58 % (ref 34–71)
Absolute Eosinophil Count: 0 10*3/uL (ref 0.00–0.50)
Absolute Lymphocyte Count: 1.54 10*3/uL (ref 1.00–4.80)
Basophils: 0.02 10*3/uL (ref 0.00–0.20)
Hematocrit: 40 % (ref 38–50)
Hemoglobin: 13.1 g/dL (ref 13.0–18.0)
Immature Granulocytes: 0.01 10*3/uL (ref 0.00–0.05)
MCH: 28.2 pg (ref 27.3–33.6)
MCHC: 33.2 g/dL (ref 32.2–36.5)
MCV: 85 fL (ref 81–98)
Monocytes: 0.64 10*3/uL (ref 0.00–0.80)
Neutrophils: 2.94 10*3/uL (ref 1.80–7.00)
Platelet Count: 154 10*3/uL (ref 150–400)
RBC: 4.65 mil/uL (ref 4.40–5.60)
RDW-CV: 14.4 % (ref 11.6–14.4)
WBC: 5.15 10*3/uL (ref 4.3–10.0)

## 2011-02-24 LAB — BASIC METABOLIC PANEL
Anion Gap: 7 (ref 3–11)
Calcium: 10.2 mg/dL (ref 8.9–10.2)
Carbon Dioxide, Total: 26 mEq/L (ref 22–32)
Chloride: 107 mEq/L (ref 98–108)
Creatinine: 1.77 mg/dL — ABNORMAL HIGH (ref 0.51–1.18)
GFR, Calc, African American: 46 mL/min — ABNORMAL LOW (ref >59)
GFR, Calc, European American: 38 mL/min — ABNORMAL LOW (ref >59)
Glucose: 112 mg/dL (ref 62–125)
Potassium: 4.5 mEq/L (ref 3.7–5.2)
Sodium: 140 mEq/L (ref 136–145)
Urea Nitrogen: 32 mg/dL — ABNORMAL HIGH (ref 8–21)

## 2011-02-24 LAB — LIPID PANEL
Cholesterol (LDL): 71 mg/dL (ref <130)
Cholesterol/HDL Ratio: 2.8
HDL Cholesterol: 46 mg/dL (ref >40)
Non-HDL Cholesterol: 81 mg/dL (ref 0–159)
Total Cholesterol: 127 mg/dL (ref <200)
Triglyceride: 51 mg/dL (ref <150)

## 2011-02-24 LAB — THYROID STIMULATING HORMONE: Thyroid Stimulating Hormone: 1.05 u[IU]/mL (ref 0.400–5.000)

## 2011-02-24 LAB — ALT (GPT): ALT (GPT): 30 U/L (ref 10–48)

## 2011-03-04 ENCOUNTER — Ambulatory Visit (HOSPITAL_BASED_OUTPATIENT_CLINIC_OR_DEPARTMENT_OTHER): Payer: PRIVATE HEALTH INSURANCE

## 2011-03-04 ENCOUNTER — Other Ambulatory Visit (HOSPITAL_BASED_OUTPATIENT_CLINIC_OR_DEPARTMENT_OTHER): Payer: Self-pay | Admitting: Cardiovascular Disease

## 2011-03-04 ENCOUNTER — Ambulatory Visit
Admission: RE | Admit: 2011-03-04 | Discharge: 2011-03-05 | Disposition: A | Discharge status: Home / Self Care | Payer: PRIVATE HEALTH INSURANCE | Attending: Cardiovascular Disease | Admitting: Cardiovascular Disease

## 2011-03-04 ENCOUNTER — Encounter (INDEPENDENT_AMBULATORY_CARE_PROVIDER_SITE_OTHER): Payer: Self-pay | Admitting: Family Medicine

## 2011-03-04 ENCOUNTER — Ambulatory Visit (HOSPITAL_COMMUNITY): Payer: PRIVATE HEALTH INSURANCE | Admitting: Cardiovascular Disease

## 2011-03-04 DIAGNOSIS — I252 Old myocardial infarction: Secondary | ICD-10-CM | POA: Insufficient documentation

## 2011-03-04 DIAGNOSIS — I2510 Atherosclerotic heart disease of native coronary artery without angina pectoris: Secondary | ICD-10-CM | POA: Insufficient documentation

## 2011-03-04 DIAGNOSIS — I10 Essential (primary) hypertension: Secondary | ICD-10-CM | POA: Insufficient documentation

## 2011-03-04 LAB — BASIC METABOLIC PANEL
Anion Gap: 7 (ref 3–11)
Calcium: 9.2 mg/dL (ref 8.9–10.2)
Carbon Dioxide, Total: 24 mEq/L (ref 22–32)
Chloride: 109 mEq/L — ABNORMAL HIGH (ref 98–108)
Creatinine: 1.56 mg/dL — ABNORMAL HIGH (ref 0.51–1.18)
GFR, Calc, African American: 53 mL/min — ABNORMAL LOW (ref >59)
GFR, Calc, European American: 44 mL/min — ABNORMAL LOW (ref >59)
Glucose: 80 mg/dL (ref 62–125)
Potassium: 4.3 mEq/L (ref 3.7–5.2)
Sodium: 140 mEq/L (ref 136–145)
Urea Nitrogen: 29 mg/dL — ABNORMAL HIGH (ref 8–21)

## 2011-03-04 LAB — CBC, DIFF
% Basophils: 1 % (ref 0–1)
% Basophils: 2 % — ABNORMAL HIGH (ref 0–1)
% Eosinophils: 0 % (ref 0–7)
% Eosinophils: 0 % (ref 0–7)
% Immature Granulocytes: 0 % (ref 0–1)
% Immature Granulocytes: 0 % (ref 0–1)
% Lymphocytes: 36 % (ref 19–53)
% Lymphocytes: 23 % (ref 19–53)
% Monocytes: 11 % (ref 5–13)
% Monocytes: 10 % (ref 5–13)
% Neutrophils: 52 % (ref 34–71)
% Neutrophils: 65 % (ref 34–71)
Absolute Eosinophil Count: 0 10*3/uL (ref 0.00–0.50)
Absolute Eosinophil Count: 0 10*3/uL (ref 0.00–0.50)
Absolute Lymphocyte Count: 1.58 10*3/uL (ref 1.00–4.80)
Absolute Lymphocyte Count: 0.85 10*3/uL — ABNORMAL LOW (ref 1.00–4.80)
Basophils: 0.02 10*3/uL (ref 0.00–0.20)
Basophils: 0.06 10*3/uL (ref 0.00–0.20)
Hematocrit: 39 % (ref 38–50)
Hematocrit: 36 % — ABNORMAL LOW (ref 38–50)
Hemoglobin: 13.1 g/dL (ref 13.0–18.0)
Hemoglobin: 12.2 g/dL — ABNORMAL LOW (ref 13.0–18.0)
Immature Granulocytes: 0.01 10*3/uL (ref 0.00–0.05)
Immature Granulocytes: 0 10*3/uL (ref 0.00–0.05)
MCH: 28.4 pg (ref 27.3–33.6)
MCH: 28.4 pg (ref 27.3–33.6)
MCHC: 33.6 g/dL (ref 32.2–36.5)
MCHC: 33.7 g/dL (ref 32.2–36.5)
MCV: 85 fL (ref 81–98)
MCV: 84 fL (ref 81–98)
Monocytes: 0.47 10*3/uL (ref 0.00–0.80)
Monocytes: 0.36 10*3/uL (ref 0.00–0.80)
Neutrophils: 2.36 10*3/uL (ref 1.80–7.00)
Neutrophils: 2.5 10*3/uL (ref 1.80–7.00)
Platelet Count: 152 10*3/uL (ref 150–400)
Platelet Count: 135 10*3/uL — ABNORMAL LOW (ref 150–400)
RBC: 4.61 mil/uL (ref 4.40–5.60)
RBC: 4.29 mil/uL — ABNORMAL LOW (ref 4.40–5.60)
RDW-CV: 14.8 % — ABNORMAL HIGH (ref 11.6–14.4)
RDW-CV: 14.7 % — ABNORMAL HIGH (ref 11.6–14.4)
WBC: 4.44 10*3/uL (ref 4.3–10.0)
WBC: 3.78 10*3/uL — ABNORMAL LOW (ref 4.3–10.0)

## 2011-03-04 LAB — CK TOTAL WITH MBMASS AND QUOTIENT
CK MB Mass: 4 ng/mL (ref 0–5)
CK MB Quotient: 4 — ABNORMAL HIGH (ref 0–3)
Creatinine Kinase Total Activity: 100 U/L (ref 30–285)

## 2011-03-04 LAB — PROTHROMBIN & PTT
Partial Thromboplastin Time: 37 s — ABNORMAL HIGH (ref 22–35)
Prothrombin INR: 1 (ref 0.8–1.3)
Prothrombin Time Patient: 13 s (ref 10.7–15.6)

## 2011-03-04 LAB — TROPONIN_I: Troponin_I: 0.02 ng/mL (ref 0.00–0.39)

## 2011-03-04 NOTE — Telephone Encounter (Signed)
Patient scheduled at [facility] on 11/9 (Fri) at 3:45pm.

## 2011-03-04 NOTE — Telephone Encounter (Signed)
Referral placed.  Thanks for helping getting this organized,  ct

## 2011-03-04 NOTE — Telephone Encounter (Signed)
Forwarding to PCP to advise on referral request--[facility]?  Does patient need an appointment for this request?

## 2011-03-04 NOTE — Telephone Encounter (Signed)
Informed patient through eCare for patient to call [facility] for an appointment.

## 2011-03-04 NOTE — Telephone Encounter (Signed)
Left detail message for patient to advise Dr. Burgess Estelle not in clinic this week and will return next Tuesday.  Also wanted to check with patient to see if wants to follow up with provider to discuss new care plan.

## 2011-03-04 NOTE — Telephone Encounter (Signed)
Patient has been admitted to [facility].

## 2011-03-05 ENCOUNTER — Other Ambulatory Visit (HOSPITAL_COMMUNITY): Payer: Self-pay | Admitting: Cardiovascular Disease

## 2011-03-05 LAB — CBC, DIFF
% Basophils: 0 % (ref 0–1)
% Eosinophils: 0 % (ref 0–7)
% Immature Granulocytes: 0 % (ref 0–1)
% Lymphocytes: 23 % (ref 19–53)
% Monocytes: 9 % (ref 5–13)
% Neutrophils: 68 % (ref 34–71)
Absolute Eosinophil Count: 0 10*3/uL (ref 0.00–0.50)
Absolute Lymphocyte Count: 1.28 10*3/uL (ref 1.00–4.80)
Basophils: 0.02 10*3/uL (ref 0.00–0.20)
Hematocrit: 37 % — ABNORMAL LOW (ref 38–50)
Hemoglobin: 12.2 g/dL — ABNORMAL LOW (ref 13.0–18.0)
Immature Granulocytes: 0.01 10*3/uL (ref 0.00–0.05)
MCH: 28 pg (ref 27.3–33.6)
MCHC: 33.1 g/dL (ref 32.2–36.5)
MCV: 85 fL (ref 81–98)
Monocytes: 0.48 10*3/uL (ref 0.00–0.80)
Neutrophils: 3.73 10*3/uL (ref 1.80–7.00)
Platelet Count: 134 10*3/uL — ABNORMAL LOW (ref 150–400)
RBC: 4.35 mil/uL — ABNORMAL LOW (ref 4.40–5.60)
RDW-CV: 14.7 % — ABNORMAL HIGH (ref 11.6–14.4)
WBC: 5.52 10*3/uL (ref 4.3–10.0)

## 2011-03-05 LAB — CK TOTAL WITH MBMASS AND QUOTIENT
CK MB Mass: 4 ng/mL (ref 0–5)
CK MB Mass: 3 ng/mL (ref 0–5)
CK MB Quotient: 4 — ABNORMAL HIGH (ref 0–3)
CK MB Quotient: 3 (ref 0–3)
Creatinine Kinase Total Activity: 93 U/L (ref 30–285)
Creatinine Kinase Total Activity: 93 U/L (ref 30–285)

## 2011-03-05 LAB — TROPONIN_I
Troponin_I: 0.05 ng/mL (ref 0.00–0.39)
Troponin_I: 0.07 ng/mL (ref 0.00–0.39)

## 2011-03-06 ENCOUNTER — Other Ambulatory Visit (HOSPITAL_BASED_OUTPATIENT_CLINIC_OR_DEPARTMENT_OTHER): Payer: Self-pay | Admitting: Physical Medicine & Rehabilitation

## 2011-03-06 ENCOUNTER — Ambulatory Visit (HOSPITAL_BASED_OUTPATIENT_CLINIC_OR_DEPARTMENT_OTHER)
Payer: PRIVATE HEALTH INSURANCE | Attending: Physical Medicine & Rehabilitation | Admitting: Physical Medicine & Rehabilitation

## 2011-03-06 ENCOUNTER — Encounter (HOSPITAL_BASED_OUTPATIENT_CLINIC_OR_DEPARTMENT_OTHER): Payer: PRIVATE HEALTH INSURANCE

## 2011-03-06 DIAGNOSIS — M545 Low back pain, unspecified: Secondary | ICD-10-CM | POA: Insufficient documentation

## 2011-03-06 LAB — PR RADEX SPINE LUMBOSACRAL 2/3 VIEWS

## 2011-03-17 ENCOUNTER — Other Ambulatory Visit (HOSPITAL_BASED_OUTPATIENT_CLINIC_OR_DEPARTMENT_OTHER): Payer: Self-pay | Admitting: Physical Medicine & Rehabilitation

## 2011-03-17 ENCOUNTER — Ambulatory Visit (HOSPITAL_BASED_OUTPATIENT_CLINIC_OR_DEPARTMENT_OTHER): Payer: PRIVATE HEALTH INSURANCE | Attending: Physical Medicine & Rehabilitation

## 2011-03-17 DIAGNOSIS — M545 Low back pain, unspecified: Secondary | ICD-10-CM | POA: Insufficient documentation

## 2011-03-17 DIAGNOSIS — M5106 Intervertebral disc disorders with myelopathy, lumbar region: Secondary | ICD-10-CM | POA: Insufficient documentation

## 2011-03-17 LAB — PR MRI SPINAL CANAL LUMBAR W/O CONTRAST MATERIAL

## 2011-03-23 ENCOUNTER — Ambulatory Visit (HOSPITAL_BASED_OUTPATIENT_CLINIC_OR_DEPARTMENT_OTHER)
Payer: PRIVATE HEALTH INSURANCE | Attending: Physical Medicine & Rehabilitation | Admitting: Physical Medicine & Rehabilitation

## 2011-03-23 DIAGNOSIS — M48062 Spinal stenosis, lumbar region with neurogenic claudication: Secondary | ICD-10-CM | POA: Insufficient documentation

## 2011-03-23 DIAGNOSIS — R292 Abnormal reflex: Secondary | ICD-10-CM | POA: Insufficient documentation

## 2011-04-03 ENCOUNTER — Encounter (INDEPENDENT_AMBULATORY_CARE_PROVIDER_SITE_OTHER): Payer: Self-pay | Admitting: Family Medicine

## 2011-04-03 NOTE — Telephone Encounter (Signed)
Dr. Burgess Estelle - Would you like pt to schedule appt asap? Thanks.

## 2011-04-06 ENCOUNTER — Other Ambulatory Visit: Payer: Self-pay | Admitting: Pharmacist

## 2011-04-06 MED ORDER — BENAZEPRIL HCL 20 MG OR TABS
ORAL_TABLET | ORAL | Status: DC
Start: 2011-04-06 — End: 2012-06-07

## 2011-04-06 NOTE — Telephone Encounter (Signed)
NV 04/16/11 with Cardio

## 2011-04-14 ENCOUNTER — Encounter (INDEPENDENT_AMBULATORY_CARE_PROVIDER_SITE_OTHER): Payer: Self-pay | Admitting: Family Medicine

## 2011-04-14 MED ORDER — HYDROCODONE-ACETAMINOPHEN 5-500 MG OR TABS
ORAL_TABLET | ORAL | Status: AC
Start: 2011-04-14 — End: 2011-04-23

## 2011-04-16 ENCOUNTER — Ambulatory Visit: Payer: PRIVATE HEALTH INSURANCE | Attending: Cardiovascular Disease | Admitting: Cardiovascular Disease

## 2011-04-16 DIAGNOSIS — I10 Essential (primary) hypertension: Secondary | ICD-10-CM | POA: Insufficient documentation

## 2011-04-16 DIAGNOSIS — N19 Unspecified kidney failure: Secondary | ICD-10-CM | POA: Insufficient documentation

## 2011-04-16 DIAGNOSIS — E785 Hyperlipidemia, unspecified: Secondary | ICD-10-CM | POA: Insufficient documentation

## 2011-04-16 DIAGNOSIS — I712 Thoracic aortic aneurysm, without rupture, unspecified: Secondary | ICD-10-CM | POA: Insufficient documentation

## 2011-04-16 DIAGNOSIS — I2510 Atherosclerotic heart disease of native coronary artery without angina pectoris: Secondary | ICD-10-CM | POA: Insufficient documentation

## 2011-04-30 ENCOUNTER — Encounter (HOSPITAL_BASED_OUTPATIENT_CLINIC_OR_DEPARTMENT_OTHER): Payer: Self-pay | Admitting: Cardiovascular Disease

## 2011-06-24 ENCOUNTER — Encounter (HOSPITAL_BASED_OUTPATIENT_CLINIC_OR_DEPARTMENT_OTHER): Payer: PRIVATE HEALTH INSURANCE | Admitting: Internal Medicine

## 2011-07-01 ENCOUNTER — Ambulatory Visit (INDEPENDENT_AMBULATORY_CARE_PROVIDER_SITE_OTHER): Payer: PRIVATE HEALTH INSURANCE | Admitting: Family Medicine

## 2011-07-03 ENCOUNTER — Other Ambulatory Visit: Payer: Self-pay | Admitting: Pharmacist

## 2011-07-03 MED ORDER — SIMVASTATIN 40 MG OR TABS
ORAL_TABLET | ORAL | Status: DC
Start: 2011-07-03 — End: 2011-07-15

## 2011-07-03 NOTE — Telephone Encounter (Signed)
Next visit = 07/15/11 (PCP), 08/17/11 ([facility])

## 2011-07-15 ENCOUNTER — Ambulatory Visit (INDEPENDENT_AMBULATORY_CARE_PROVIDER_SITE_OTHER): Payer: PRIVATE HEALTH INSURANCE | Admitting: Family Medicine

## 2011-07-15 ENCOUNTER — Encounter (INDEPENDENT_AMBULATORY_CARE_PROVIDER_SITE_OTHER): Payer: Self-pay | Admitting: Family Medicine

## 2011-07-15 VITALS — BP 138/64 | HR 80 | Temp 97.8°F | Resp 14 | Ht 70.0 in | Wt 235.6 lb

## 2011-07-15 MED ORDER — HYDROCODONE-ACETAMINOPHEN 10-300 MG OR TABS
ORAL_TABLET | ORAL | Status: DC
Start: 2011-07-15 — End: 2012-03-30

## 2011-07-15 MED ORDER — SIMVASTATIN 40 MG OR TABS
ORAL_TABLET | ORAL | Status: DC
Start: 2011-07-15 — End: 2012-08-09

## 2011-07-15 MED ORDER — SIMVASTATIN 40 MG OR TABS
ORAL_TABLET | ORAL | Status: DC
Start: 2011-07-15 — End: 2011-07-15

## 2011-07-15 NOTE — Patient Instructions (Addendum)
Try albuterol inhaler 2 puffs up to every 4 hours though unlikely that you will need it this frequently to see if this helps with cough and shortness of breath    remember to use the vicodin (pain med) very judiciously    For constipation: try prunes! High fiber cereals. Docusate sodium (otc)--stool softener. Metamucil can be helpful--make sure you drink plenty of water with it.

## 2011-07-15 NOTE — Progress Notes (Signed)
S:  Tracy Conway is here for a yearly physical exam  Doing pretty well  Takes hydrocodone when he needs to standing for a period of time for his back pain (lumbar stenosis) for which he has seen Dr. Ellin Goodie at [facility]:  L4-5 advanced central stenosis with secondary neurogenic claudication with symmetric loss of reflexes in the distal lower limbs but no overt weakness noted. His main symptom is neurogenic claudication/pain    I had a long discussion with the patient regarding my above impression and the foregoing management plan. I discussed the natural history, pathophysiology, and treatment approaches for lumbar stenosis. We discussed the role of oral medications, rehabilitation measures, activity modification, epidural steroid injections as a temporizing measure at best, as well as surgical intervention. He would like to consider an epidural steroid injection. However, given he is on Plavix, this would need to be discontinued for risk of epidural hematoma. He will discuss this with his cardiologist, but my understanding is that with drug-eluting stents, that they will need to be on the anti-platelet agent for a year and not discontinued for elective procedures such as an epidural steroid injection. He will again discuss this with his cardiologist.    Thus, for now I have given him a pool therapy program which he felt was quite helpful for his wife and thus he would like to trial this closer to his home in [facility].   .     May wake up a couple times at night with the back pain  But generally can tolerate it ok.    takes some pain meds before going to the gym    Had brachytherapy march 2012 for prostate ca--doing well    Respiratory/breathing problems come and go. Never totally good. Coughing more in last couple of weeks. No real sputum production  Jan--goes to [place] every year. This yr went for 7 weeks and also was in [place]--did not get as much exercise as usual.  Feels that reg exercise helps keep his breathing  in as good shape as possible  Has appt with pulm next week  On spiriva and serevent  Has 2 albuterol inhalers that he got from his daughter at home though has not been using these    ROS:  Constitutional: no weight loss, weight gain  Eyes: no complaints  ENT/mouth: neg  Cardiovascular: no chest pain  Respiratory: no asthma, pneumonia, bronchitis, TB.   Gastrointestinal: no PUD, GB, liver disease, change in bowels.   GU/GYNE: no urinary complaints.   Musculoskeletal: see above  Skin/breast: neg  Neuro: no seizures, migraines, loc.  Psych: no depression,anxiety, substance abuse.   Endo: no thyroid problems, no DM.   Hematologic/Lymphatic: no blood disorders  Allergic: see allergy profile.     Patient Active Problem List    Diagnosis Date Noted   . CORONARY ARTERY DISEASE [410.90] 12/30/2010     Non-ST elevation myocardial infarction., July 18,2012  LAD drug-eluting stent nov, 2012. clopidogrel       . Malignant neoplasm of prostate [185] 08/14/2010     Diagnosed 07/2010. Sees Dr. Rennis Harding (urol) and Dr. Perlie Gold (rad onc). Saw Birdie Hopes, [name]--[facility]--palladium seed implants June 2012  Tracy. Forget has an intermediate risk prostate cancer on the basis of a Gleason 7 histology       . LOW BACK PAIN(LUMBAGO) [724.2] 12/19/2009   . Routine general medical examination at a health care facility [V70.0] 05/15/2009     07/16/2011  Td/Tdap (q 10 yr):  2004, 2012  Pneumococcal (65 yoa and high risk x1):  2010, 2004  Influenza vaccine (q 1 yr):  2010 nov, 2011, 2012  Blood pressure:  Excellent 138/64  Cholesterol (q 5 yr begin age 72):on simvastatin good 05/06/2010: H: 38, oct 2012: excellent  AAA screen (x1, male age 13, if ever smoked):  Checked at [place]-- abdo and thoracic CT--aug 2012: stable ascending aortic aneurysm  Colorectal cancer: ct colonoscopy done 2008, repeat due 2013   PSA:  Sees urology for fu prostate ca. Has had palladium seed implants  Tobacco:  NS x 11 yrs, 50 pack yr hx  Exercise:   Reg--treadmill x 20-30 mins then weight machines--every other day!  Alcohol /Drugs:  None in 26 yrs  Sexual practice/contraception:  Not an issue  Advanced directives:  Does have living will. DPOA  done       . GENERAL OSTEOARTHROSIS [715.00] 01/07/2007     Has seen orthopedics--cortisone shot to L knee helped.  L knee replacement 2008     . DIVERTICULITIS OF COLON W/O BLEED [562.11] 01/07/2007     Hospitalized at [facility] May 2008     . TOBACCO USE DISORDER [305.1] 12/24/2005     Quit 2000.  50 pack year history  EtOH--quit 1986.     Marland Kitchen HEARING LOSS NOS [389.9] 12/24/2005     Bilat hearing aids.     . ECZEMA  [692.9] 12/24/2005     Lower extremities in particular.     Marland Kitchen ERECTILE DYSFUNCTION [607.84] 12/24/2005   . BENIGN NEOPLASM LG BOWEL [211.3] 12/24/2005     Tubular adenoma. Dx'ed flex sig 2000--> colonoscopy 2000: N.  F/U 2003: mild diverticulosis, tubular adenoma.  F/U due 2008 with CT colonography.--done 7/08: ess. N.  Next due 2013       . CALCULUS OF KIDNEY [592.0] 12/24/2005     Lithotripsy 1988  Stone panel in 0ct 09: high oxalate. Given low oxalae diet by Dr. Arbie Cookey, nephrology in 2009.       Marland Kitchen COPD [496] 12/24/2005     Emphysema: an FEV1 which is approximately at 33% of predicted    04/2010--Pulm: Chronic obstructive pulmonary disease, severe to very severe. I explained to him that continuing on the Spiriva as well as the Serevent was likely good therapy.I further told him that if he should have recurrent exacerbations within the course of the year that I would consider adding an inhaled corticosteroid at that point in time. He does not need oxygen at this point in time. He remains a nonsmoker. He is already engaged in a pulmonary rehabilitation.    Today I described to him how overall lung volume reduction surgery had no significant benefit on mortality. Subgroup analysis did indicate that those patients, who had upper lobe predominant disease with low exercise capacity were the most likely to benefit.  Given the fact that he actually still has reasonably good functional status and is getting around and going to the gym and working out, it is not clear that he would be in the group that would most likely benefit, although, to safe for certain, we need to get a cardiopulmonary exercise test. At this point in time, he is feeling good with his chronic obstructive pulmonary disease therapy and is not interested in pursuing lung volume reduction surgery.    05/20/2010 CT angio: Addendum: Increased scarring in the right lung base since the prior   exam including the interval development of a new nodule.  The   appearance suggests that the patient has had a recent infection of   the right lower lobe has resolved and since the prior scan. However   recommend 3 months followup of the right lower lobe lesion given that   the patient is high risk with extensive paraseptal emphysema.                . Thoracic aneurysm without mention of rupture [441.2] 12/24/2005     Ascending aortic aneurysm.  Followed by Dr. Brunilda Payor, Cardiac surgery.  Gets regular CT scans.  Goal systolic BP: 100-120  Also seeing Dr. Angelena Sole cardiology     . ABDOM AORTIC ANEURYSM [441.4] 12/24/2005     Father also had AAA. Borderline, seen on CT 8/06.     Marland Kitchen VENTRICULAR SEPT DEFECT [745.4] 12/24/2005     Small muscular VSD--NEEDS SBE PROPHYLAXIS. (Clindamycin 600 mg prior to dental visits.)     . HYPERTROPHY PROSTATE W/O OBST [600.00] 12/24/2005   . Chronic Kidney Disease -- Stage III [588.89] 07/22/2005     Creat ~ 1.8. Normal albuminuria (8/08)  Likely 2/2 HTN, poss component from hx obstructing nephrolithiasis.  8/08 Renal: referred to Uro for 8mm stone, nonobstructing. Also doing w/u for stone type, poss target prevention.     Marland Kitchen HYPERTENSION NOS(aka HTN) [401.9] 03/29/2002   . PURE HYPERCHOLESTEROLEM [272.0] 03/29/2002   . MALIG NEOPLASM DORSAL TONGUE(aka CANCER) [141.1] 03/29/2002     Ssurgery 1994, T1N1 squamous cell ca L tongue, partial glossectomy; L  supraorbital hyoid neck dissection         O;  Pt alert and cooperative and in NAD.  BP 138/64  Pulse 80  Temp(Src) 97.8 F (36.6 C) (Temporal)  Resp 14  Ht 5\' 10"  (1.778 m)  Wt 235 lb 9.6 oz (106.867 kg)  BMI 33.80 kg/m2  H+N: PERL, EOMs full. Oroph: unremarkable--scars from prior oral surgery almost invisible, TMs clear bilat, no thyromeg, no increased cervical LNs. No supra clavicular LNs.  Chest: clear to bases bilat, no wheezes heard though A/E diminished slightly.  CV: Normal S1 and S2, no murmurs--even though heard by Dr Angelena Sole in cardiology and he apparently has a small VSD.  Good peripheral pulses.  Abdo: BS present throughout, soft, NT, ND, no organomegaly or masses  Extrem: no cyanosis or edema.  No obvious joint or limb abnormalities  Neuro: grossly normal to observation, gait normal    A/P:    In general pt doing well.  See orders.  Will need repeat CT colonoscopy  Will get routine labs--he will need these for Dr. Angelena Sole, som I have future ordered some that he can get at the same time as he has labs drawn for D.r Angelena Sole.    Congratulated him on his excellent exercise routine.    discussed natural methods to combat constipation. rtc if this becomes a worse problem.    I have refilled the hydrocodone that he takes for back pain. Again stressed the importance of taking this judiciously and infrequently in order to maintain its effectiveness and reducing side effects such as constipation and somnolence and addiction    Regarding the cough--try some albuterol to see if this helps.  He has appt with pulm coming up soon.  He might need inhaled steroid    RTC prn here    Tana Coast, MD

## 2011-07-21 ENCOUNTER — Encounter (INDEPENDENT_AMBULATORY_CARE_PROVIDER_SITE_OTHER): Payer: Self-pay | Admitting: Family Medicine

## 2011-07-22 ENCOUNTER — Ambulatory Visit: Payer: PRIVATE HEALTH INSURANCE | Attending: Pulmonary Disease | Admitting: Internal Medicine

## 2011-07-22 DIAGNOSIS — J4489 Other specified chronic obstructive pulmonary disease: Secondary | ICD-10-CM | POA: Insufficient documentation

## 2011-07-22 DIAGNOSIS — J984 Other disorders of lung: Secondary | ICD-10-CM | POA: Insufficient documentation

## 2011-07-24 NOTE — Telephone Encounter (Signed)
Hello referral coordinators,  I mistakenly ordered a colonoscopy for mr Berndt when he needed at CT colonography--i have now (hopefully) placed the correct order.  Can you help Mr. Koeppen get this scheduled?  Thanks    Ezrah Panning

## 2011-07-24 NOTE — Telephone Encounter (Signed)
Patient scheduled 4/8 (Mon).

## 2011-07-24 NOTE — Addendum Note (Signed)
Addended by: Richarda Overlie on: 07/24/2011 10:09 AM     Modules accepted: Orders

## 2011-08-03 ENCOUNTER — Other Ambulatory Visit (INDEPENDENT_AMBULATORY_CARE_PROVIDER_SITE_OTHER): Payer: Self-pay | Admitting: Family Medicine

## 2011-08-03 ENCOUNTER — Ambulatory Visit: Payer: PRIVATE HEALTH INSURANCE | Attending: Family Medicine

## 2011-08-03 DIAGNOSIS — J438 Other emphysema: Secondary | ICD-10-CM | POA: Insufficient documentation

## 2011-08-03 DIAGNOSIS — Q438 Other specified congenital malformations of intestine: Secondary | ICD-10-CM | POA: Insufficient documentation

## 2011-08-03 DIAGNOSIS — R918 Other nonspecific abnormal finding of lung field: Secondary | ICD-10-CM | POA: Insufficient documentation

## 2011-08-03 LAB — PR DIAGNOSTIC COMPUTED TOMOGRAPHY THORAX W/O CNTRST

## 2011-08-04 ENCOUNTER — Other Ambulatory Visit: Payer: Self-pay | Admitting: Family Medicine

## 2011-08-04 LAB — CT COLONOGRAPHY; DIAGNOSTIC

## 2011-08-04 MED ORDER — TAMSULOSIN HCL 0.4 MG OR CAPS
ORAL_CAPSULE | ORAL | Status: DC
Start: 2011-08-04 — End: 2012-10-24

## 2011-08-13 ENCOUNTER — Encounter (INDEPENDENT_AMBULATORY_CARE_PROVIDER_SITE_OTHER): Payer: Self-pay | Admitting: Family Medicine

## 2011-08-17 ENCOUNTER — Ambulatory Visit: Payer: PRIVATE HEALTH INSURANCE | Attending: Cardiovascular Disease | Admitting: Cardiovascular Disease

## 2011-08-17 ENCOUNTER — Other Ambulatory Visit (INDEPENDENT_AMBULATORY_CARE_PROVIDER_SITE_OTHER): Payer: Self-pay | Admitting: Family Medicine

## 2011-08-17 DIAGNOSIS — J4489 Other specified chronic obstructive pulmonary disease: Secondary | ICD-10-CM | POA: Insufficient documentation

## 2011-08-17 DIAGNOSIS — E785 Hyperlipidemia, unspecified: Secondary | ICD-10-CM | POA: Insufficient documentation

## 2011-08-17 DIAGNOSIS — I10 Essential (primary) hypertension: Secondary | ICD-10-CM | POA: Insufficient documentation

## 2011-08-17 DIAGNOSIS — I2510 Atherosclerotic heart disease of native coronary artery without angina pectoris: Secondary | ICD-10-CM | POA: Insufficient documentation

## 2011-08-17 LAB — LIPID PANEL
Cholesterol (LDL): 105 mg/dL (ref <130)
Cholesterol/HDL Ratio: 3.5
HDL Cholesterol: 48 mg/dL (ref >40)
Non-HDL Cholesterol: 118 mg/dL (ref 0–159)
Total Cholesterol: 166 mg/dL (ref <200)
Triglyceride: 64 mg/dL (ref <150)

## 2011-08-17 LAB — COMPREHENSIVE METABOLIC PANEL
ALT (GPT): 20 U/L (ref 10–48)
AST (GOT): 28 U/L (ref 15–40)
Albumin: 4 g/dL (ref 3.5–5.2)
Alkaline Phosphatase (Total): 44 U/L (ref 36–161)
Anion Gap: 7 (ref 3–11)
Bilirubin (Total): 0.8 mg/dL (ref 0.2–1.3)
Calcium: 9.9 mg/dL (ref 8.9–10.2)
Carbon Dioxide, Total: 25 mEq/L (ref 22–32)
Chloride: 105 mEq/L (ref 98–108)
Creatinine: 1.87 mg/dL — ABNORMAL HIGH (ref 0.51–1.18)
GFR, Calc, African American: 43 mL/min — ABNORMAL LOW (ref >59)
GFR, Calc, European American: 35 mL/min — ABNORMAL LOW (ref >59)
Glucose: 96 mg/dL (ref 62–125)
Potassium: 5.5 mEq/L — ABNORMAL HIGH (ref 3.7–5.2)
Protein (Total): 6.8 g/dL (ref 6.0–8.2)
Sodium: 137 mEq/L (ref 136–145)
Urea Nitrogen: 46 mg/dL — ABNORMAL HIGH (ref 8–21)

## 2011-08-18 ENCOUNTER — Telehealth (INDEPENDENT_AMBULATORY_CARE_PROVIDER_SITE_OTHER): Payer: Self-pay | Admitting: Family Medicine

## 2011-08-18 LAB — HEMOGLOBIN A1C, HPLC: Hemoglobin A1C: 5.8 % (ref 4.0–6.0)

## 2011-08-18 NOTE — Telephone Encounter (Signed)
TE to patient to discuss test results and elevated potassium level. Patient reports feeling well, no chest pain. No medication changes, patient reports being well-hydrated. Recommend rechecking BMP, as possible to be lab error/hemolyzed sample. Patient will come in tomorrow for lab draw (he will call in am for appointment) and will f/u with him by phone.  Jonelle Sports, MD

## 2011-08-21 ENCOUNTER — Encounter (INDEPENDENT_AMBULATORY_CARE_PROVIDER_SITE_OTHER): Payer: Self-pay | Admitting: Family Practice

## 2011-08-21 ENCOUNTER — Ambulatory Visit (INDEPENDENT_AMBULATORY_CARE_PROVIDER_SITE_OTHER): Payer: PRIVATE HEALTH INSURANCE | Admitting: Family Practice

## 2011-08-21 ENCOUNTER — Other Ambulatory Visit (INDEPENDENT_AMBULATORY_CARE_PROVIDER_SITE_OTHER): Payer: PRIVATE HEALTH INSURANCE

## 2011-08-21 ENCOUNTER — Telehealth (INDEPENDENT_AMBULATORY_CARE_PROVIDER_SITE_OTHER): Payer: Self-pay | Admitting: Family Medicine

## 2011-08-21 VITALS — BP 119/68 | HR 63 | Temp 97.8°F | Resp 16 | Wt 234.5 lb

## 2011-08-21 LAB — BASIC METABOLIC PANEL
Anion Gap: 7 (ref 3–11)
Calcium: 9.8 mg/dL (ref 8.9–10.2)
Carbon Dioxide, Total: 23 mEq/L (ref 22–32)
Chloride: 105 mEq/L (ref 98–108)
Creatinine: 1.62 mg/dL — ABNORMAL HIGH (ref 0.51–1.18)
GFR, Calc, African American: 51 mL/min — ABNORMAL LOW (ref >59)
GFR, Calc, European American: 42 mL/min — ABNORMAL LOW (ref >59)
Glucose: 96 mg/dL (ref 62–125)
Potassium: 4.8 mEq/L (ref 3.7–5.2)
Sodium: 135 mEq/L — ABNORMAL LOW (ref 136–145)
Urea Nitrogen: 40 mg/dL — ABNORMAL HIGH (ref 8–21)

## 2011-08-21 LAB — URIC ACID, SERUM: Uric Acid: 7.9 mg/dL — ABNORMAL HIGH (ref 3.4–7.0)

## 2011-08-21 NOTE — Patient Instructions (Addendum)
1. Labs today to evaluate for gout  2. Metatarsal pads (see if this product is available at the pharmacy; otherwise, available online); insert into the right shoe (if any difficulty with insertion, then back to clinic and MD can help fit).   3. If fevers, chills, redness, warmth, or joint pain, then call clinic nurse to discuss (this would be concerning for a possible skin infection such as cellulitis which would likely need antibiotics).   4. Follow up with Dr. Burgess Estelle or Dr. Michele Mcalpine in 3-5 days to reassess and follow up on labs.

## 2011-08-21 NOTE — Progress Notes (Signed)
------------------------------------------    Attending: Kyung Rudd, MD  Foot pain.  Agree with metatarsal pad, check uric acid level, check x-ray of foot, rtc if not resolving.   I agree with the findings and plan as documented in the resident's note.  ----------------------------------------

## 2011-08-21 NOTE — Progress Notes (Signed)
ID/CC: 77 yo M with a complex past medical history presenting with right foot pain.       S:   Present on waking 48 hours ago; peaked yesterday; persistent; located on the sole of his foot below the third toe; no radiation; worse with bearing weight; no swelling. No recent trauma. No DM or peripheral neuropathy. No sensation of foreign body. No bleeding, swelling, discharge, puncture wounds, breaks in the skin, toe pain, fevers, chills, n/v. Pain did not wake him from sleep. Mild improvement with opioid analgesia at home (name unknown; maybe vicodin?). No NSAIDS or acetaminophen attempted.     History of similar near the great toe of the left foot months-years ago; unclear etiology; resolved spontaneously without complications. No family history of gout. Currently taking asa for CV protection.       Social Hx: Married. No tobacco, EtOH, illicits. Retired.   ROS: Denies chest pain, SOB, n/v, abdominal pain.       PE:  Filed Vitals:    08/21/11 1114   BP: 119/68   Pulse: 63   Temp: 97.8 F (36.6 C)   TempSrc: Temporal   Resp: 16   Weight: 234 lb 8 oz (106.369 kg)   SpO2: 91%     General: NAD.   HEENT: MMM  Resp: Breathing comfortably on room air. No increased work of breathing.   MSK: Right foot with diffuse mild erythema to the ankle, mild non-pitting swelling of the foot relative to the left foot, and tenderness to palpation over the distal aspect of the right second MP joint on the plantar aspect; no mass or fluctuance or foreign body appreciated; no ecchymosis or abrasions. Full ROM with 1st-5th digits. Weight bearing with a normal gait.   Psych: Appropriate eye contact.       A/P  729.5 Foot pain, right  (primary encounter diagnosis)  Comment: Etiology unclear; most likely gout base on age, asa use, history of great toe involvement on the contralateral foot, timing of peak of pain. DDx includes metatarsalgia, Morton's neuroma, stress fracture, inflammatory or degenerative arthritis. Erythema of the foot could be  early onset cellullitis however constellation of symptoms and focal foot pain in particular is more consistent with gout.   Plan: URIC ACID, SERUM, SUPPLY GENERIC        Will call with results.   1. Labs today to evaluate for gout  2. Metatarsal pads (see if this product is available at the pharmacy; otherwise, available online); insert into the right shoe (if any difficulty with insertion, then back to clinic and MD can help fit).   3. If fevers, chills, redness, warmth, or joint pain, then call clinic nurse to discuss (this would be concerning for a possible skin infection such as cellulitis which would likely need antibiotics).   4. Follow up with Dr. Burgess Estelle or Dr. Michele Mcalpine in 3-5 days to reassess and follow up on labs.     276.7 Serum potassium elevated  Comment: Future ordered by PCP; will check with lab draw to eval for gout  Plan: BASIC METABOLIC PANEL           250.00 Diabetes mellitus  Comment: Future ordered by PCP; will check with lab draw to eval for gout  Plan: BASIC METABOLIC PANEL             Note: Called pt with results on same day as visit. Uric acid elevated, suggesting gout. Pt reports improvement in pain over the course of the afternoon. Has  follow up appt with me to discuss results and indications to treat. FYI to PCP.

## 2011-08-25 ENCOUNTER — Encounter (INDEPENDENT_AMBULATORY_CARE_PROVIDER_SITE_OTHER): Payer: Self-pay | Admitting: Family Medicine

## 2011-08-25 ENCOUNTER — Ambulatory Visit (INDEPENDENT_AMBULATORY_CARE_PROVIDER_SITE_OTHER): Payer: PRIVATE HEALTH INSURANCE | Admitting: Family Medicine

## 2011-08-25 VITALS — BP 135/66 | HR 59 | Temp 98.1°F | Resp 12 | Ht 70.08 in | Wt 235.8 lb

## 2011-08-25 NOTE — Progress Notes (Signed)
Tracy Conway is a 77 year old male here to discuss the following:    Has had hemoptysis since Sunday. At first just streaks, some small clots, today larger amount, bright red quarter size. These past days he denies increased dyspnea, reduced exercise tolerance, increased sputum or cough. Denies new respiratory symptoms. Has been feeling well, at his baseline. On Saturday did get some food stuck in his throat and had a coughing fit to expel it. Other than that has had a hoarse voice all weekend but this happens often secondary GERD.      REVIEW OF SYSTEMS:  No fevers, no chills, no SOB, no chest pain, no abdominal pain, no change in bowel habits, no dysuria.    PROBLEM LIST:  CAD, NSTEMI in 7/12 s/p PCI/DES to LCx   Hypertension   Hyperlipidemia   COPD, FEV1 1.1 L (31%) in 2/11-severe   Pulmonary nodules  Mild ascending aortic aneurysm   Hx of 50 pack-year smoking, quit in 2000   CKD 2/2 hypertension      OBJECTIVE:  BP 135/66  Pulse 59  Temp 98.1 F (36.7 C) (Temporal)  Resp 12  Ht 5' 10.08" (1.78 m)  Wt 235 lb 12.8 oz (106.958 kg)  BMI 33.76 kg/m2  SpO2 96%  General: healthy, alert, no distress  HEENT: oropharynx clear, good clean dentition, no adenopathy  CV: RRR, 2/6 systolic ejection murmur at LSB radiating to apices, no JVD, no edema  Resp: Clear to auscultation, poor air movement  Abd: soft, distended, no masses  Ext: clubbing  Skin: no bruising, petechiae    ASSESSMENT AND PLAN:    # Hemoptysis: Since Sunday with frank red blood. Hemodynamically stable. No new or worsening respiratory symptoms. No fevers. Did have recent cough fit in context of chocking on Saturday and is on blood thinner Plavix for LAD stent. Recent Chest CT from 08/03/11 shows multiple new nodules. DDx includes mechanical trauma of small vessel from coughing fit, infection, intra or peribronchial bleeding mass.  Plan: Referred to his Pulmonologist to evaluate for bronchoscopy  Counseled about worsening cough, SOB, increased  sputum as signs to come back to clinic for antibiotics  Counseled about going to ER if increasing hemoptysis     Johnpaul Gillentine Willy Eddy, MD

## 2011-08-25 NOTE — Addendum Note (Signed)
Addended by: Trilby Leaver on: 08/25/2011 11:36 AM     Modules accepted: Level of Service

## 2011-08-25 NOTE — Progress Notes (Signed)
-------------------------------------------    Attending: Dynastie Knoop M Elder Davidian, MD  I have reviewed the resident's note and agree with the findings, assessment and plan documented in the note..  -------------------------------------------

## 2011-08-25 NOTE — Progress Notes (Addendum)
------------------------------------------    Attending: Trilby Leaver, MD  I saw and evaluated the patient with this resident.  I discussed the case with the resident.  Key findings based upon my evaluation and discussion: 77 y/o with severe COPD with no onset of hemoptysis since Sunday.  No dyspnea, SOB, DOE, increased sputum production, fever or edema.  Had CT in August with new small nodules repeated in April with more new nodules. Exam normal.  Hx of CAD on Plavix for stent, renal insufficiency and prostate CA s/p treatment.  Recent GERD and using H2 blocker prn with recent flare, hoarseness and mild cough.  Recommend pulm consult to assess for bronchoscopy to determine cause of bleeding.  Vic Ripper, MD   -------------------------------------------

## 2011-08-25 NOTE — Progress Notes (Signed)
-------------------------------------------    Attending: Natassia Guthridge Allen Male Minish, MD  I have reviewed the resident's note and agree with the findings, assessment and plan documented in the note..  -------------------------------------------

## 2011-08-25 NOTE — Patient Instructions (Signed)
Made referral to Pulmonology because of three days hemoptysis.  Please return to clinic if your cough or sputum increases, if you develop a fever, or other respiratory symptom.  Go to ER if bleeding increases.

## 2011-08-26 ENCOUNTER — Ambulatory Visit: Payer: PRIVATE HEALTH INSURANCE | Attending: Pulmonary Disease | Admitting: Internal Medicine

## 2011-08-26 DIAGNOSIS — R042 Hemoptysis: Secondary | ICD-10-CM | POA: Insufficient documentation

## 2011-08-26 DIAGNOSIS — J4489 Other specified chronic obstructive pulmonary disease: Secondary | ICD-10-CM | POA: Insufficient documentation

## 2011-08-28 ENCOUNTER — Ambulatory Visit
Admission: AD | Admit: 2011-08-28 | Discharge: 2011-08-28 | Disposition: A | Discharge status: Home / Self Care | Payer: PRIVATE HEALTH INSURANCE | Source: Ambulatory Visit | Attending: Pulmonary Disease | Admitting: Pulmonary Disease

## 2011-08-28 ENCOUNTER — Other Ambulatory Visit (HOSPITAL_COMMUNITY): Payer: Self-pay | Admitting: Pulmonary Disease

## 2011-08-28 ENCOUNTER — Ambulatory Visit (HOSPITAL_COMMUNITY): Payer: PRIVATE HEALTH INSURANCE | Admitting: Pulmonary Disease

## 2011-08-28 ENCOUNTER — Ambulatory Visit (HOSPITAL_BASED_OUTPATIENT_CLINIC_OR_DEPARTMENT_OTHER): Payer: PRIVATE HEALTH INSURANCE

## 2011-08-28 ENCOUNTER — Other Ambulatory Visit (HOSPITAL_BASED_OUTPATIENT_CLINIC_OR_DEPARTMENT_OTHER): Payer: Self-pay | Admitting: Pulmonary Disease

## 2011-08-28 ENCOUNTER — Ambulatory Visit (HOSPITAL_BASED_OUTPATIENT_CLINIC_OR_DEPARTMENT_OTHER): Payer: PRIVATE HEALTH INSURANCE | Admitting: Pulmonary Disease

## 2011-08-28 DIAGNOSIS — J4489 Other specified chronic obstructive pulmonary disease: Secondary | ICD-10-CM | POA: Insufficient documentation

## 2011-08-28 DIAGNOSIS — R042 Hemoptysis: Secondary | ICD-10-CM | POA: Insufficient documentation

## 2011-08-28 DIAGNOSIS — D849 Immunodeficiency, unspecified: Secondary | ICD-10-CM | POA: Insufficient documentation

## 2011-08-28 LAB — PROTHROMBIN & PTT
Partial Thromboplastin Time: 32 s (ref 22–35)
Prothrombin INR: 1.1 (ref 0.8–1.3)
Prothrombin Time Patient: 13.2 s (ref 10.7–15.6)

## 2011-08-28 LAB — CBC (HEMOGRAM)
Hematocrit: 41 % (ref 38–50)
Hemoglobin: 13.9 g/dL (ref 13.0–18.0)
MCH: 30.2 pg (ref 27.3–33.6)
MCHC: 34.1 g/dL (ref 32.2–36.5)
MCV: 89 fL (ref 81–98)
Platelet Count: 183 10*3/uL (ref 150–400)
RBC: 4.6 mil/uL (ref 4.40–5.60)
RDW-CV: 14.8 % — ABNORMAL HIGH (ref 11.6–14.4)
WBC: 5.97 10*3/uL (ref 4.3–10.0)

## 2011-08-28 LAB — SPECIMEN(S) SENT TO CHMC VIRO

## 2011-08-29 LAB — CELL COUNT & DIFF & TCS,BAL
% Basophils, BAL: 0 %
% Basophils, BAL: 0 %
% Basophils, BAL: 0 %
% CD4: 87 %
% CD4: 88 %
% CD4: 89 %
% CD8: 10 %
% CD8: 10 %
% CD8: 8 %
% Eosinophils, BAL: 1 % (ref 0–1)
% Eosinophils, BAL: 2 % — ABNORMAL HIGH (ref 0–1)
% Eosinophils, BAL: 3 % — ABNORMAL HIGH (ref 0–1)
% Lymphocytes, BAL: 12 % (ref 0–15)
% Lymphocytes, BAL: 21 % — ABNORMAL HIGH (ref 0–15)
% Lymphocytes, BAL: 31 % — ABNORMAL HIGH (ref 0–15)
% Macro/Meso cells, BAL: 56 % — ABNORMAL LOW (ref 80–100)
% Macro/Meso cells, BAL: 68 % — ABNORMAL LOW (ref 80–100)
% Macro/Meso cells, BAL: 84 % (ref 80–100)
% Neutrophils, BAL: 12 % — ABNORMAL HIGH (ref 0–3)
% Neutrophils, BAL: 2 % (ref 0–3)
% Neutrophils, BAL: 8 % — ABNORMAL HIGH (ref 0–3)
BAL Columnar Epithelial Cells: 0 % (ref 0–5)
BAL Columnar Epithelial Cells: 0 % (ref 0–5)
BAL Columnar Epithelial Cells: 0 % (ref 0–5)
BAL RBC Count: 146.06 million
BAL RBC Count: 176.08 million
BAL RBC Count: 74.24 million
BAL Total Nucleated Cells: 0.9 million
BAL Total Nucleated Cells: 3.41 million
BAL Total Nucleated Cells: 5.52 million
BAL Viability: 65 %
BAL Viability: 72 %
BAL Viability: 73 %
BAL Volume: 10 mL
BAL Volume: 25 mL
BAL Volume: 5 mL
CD4/CD8 Ratio: 11.13 — ABNORMAL HIGH (ref 0.9–2.5)
CD4/CD8 Ratio: 8.7 — ABNORMAL HIGH (ref 0.9–2.5)
CD4/CD8 Ratio: 8.8 — ABNORMAL HIGH (ref 0.9–2.5)

## 2011-08-29 LAB — IFA - HERPES SIMPLX./V. ZOSTER
HSV and VZV FA Interpretation: NEGATIVE
Herpes Simplex Virus FA: NEGATIVE
Varicella Zoster Virus FA: NEGATIVE

## 2011-08-29 LAB — FLOW CYTOMETRY SPEC. DESCRIPTION

## 2011-08-30 LAB — PNEUMOCYSTIS STAIN: Direct Exam: NEGATIVE

## 2011-08-31 LAB — LOWER RESP C/S, QUANTITATIVE
Colony Count: <100
Culture: NO GROWTH
Gram Smear: NONE SEEN

## 2011-08-31 LAB — RSV SHELL VIAL ASSAY
RSVSV Result Day 1: NEGATIVE
RSVSV Result Day 3: NEGATIVE

## 2011-08-31 LAB — SPECIMEN(S) SENT TO CHMC VIRO

## 2011-09-01 ENCOUNTER — Ambulatory Visit (INDEPENDENT_AMBULATORY_CARE_PROVIDER_SITE_OTHER): Payer: PRIVATE HEALTH INSURANCE | Admitting: Family Practice

## 2011-09-01 ENCOUNTER — Encounter (INDEPENDENT_AMBULATORY_CARE_PROVIDER_SITE_OTHER): Payer: Self-pay | Admitting: Family Practice

## 2011-09-01 VITALS — BP 133/69 | HR 61 | Temp 98.2°F | Resp 14 | Wt 237.2 lb

## 2011-09-01 NOTE — Patient Instructions (Addendum)
1. Keep a journal of your symptoms related to your feet and joints.   - frequency of attacks, how long do they last, what makes them better or worse, where do you notice the pain?    2. If you develop additional attacks, then we will likely obtain an x ray of your feet and discuss maybe starting medicines to help prevent additional attacks.

## 2011-09-01 NOTE — Progress Notes (Signed)
-------------------------------------------    Attending: Trilby Leaver, MD  I discussed this patient's history and physical findings with the resident, as well as the plan for this patient.   Key findings based upon this discussion:  Recent flares of pain, redness and tenderness of first MTP and distal foot occurring within 24 hrs and improved rapidly.  Thought to be gout and uric acid 7.9.  Mild flare now with improvement.  Will start life style changes and if recurrent flares consider xray and prophylaxis with allopurinol if needed.  Vic Ripper, MD   -------------------------------------------

## 2011-09-01 NOTE — Progress Notes (Signed)
ID/CC: 77 yo M presenting for follow up after most recent episode of monoarticular joint pain.       S:   Seen 4/26 for acute onset of pain, swelling, warmth and redness over the base of the head of the right second metatarsal.   Labs on 4/26: serum uric acid 7.9  Foot pain resolved without intervention within 72 hours of onset.   No interval symptoms.  Reports a prior episode of acute onset of joint pain in the left great toe joint approx 6 months ago; also resolved spontaneously.   Has not changed medication or diet.  Diet includes meat and seafood. No alcohol use.   Medications include 81 mg aspirin daily; no HCTZ or lasix.   Has been exercising regularly; no significant weight loss.   No family history of gout.   Personal history of DJD (shoulders, knees, hips).       Of note: since most recent visit for arthritis, also seen in clinic for hemoptysis; seen by Pulm; underwent bronchoscopy; final results of labs pending; no clear source of hemoptysis identified. Started on pantoprazole 40 mg PO BID by Pulm. No additional episodes of hemoptysis since bronchoscopy.       Social Hx: Married. Children. Working part time.   ROS: Denies fevers, chills, chest pain, SOB, cough. Mood good.     PE:  Filed Vitals:    09/01/11 0849   BP: 133/69   Pulse: 61   Temp: 98.2 F (36.8 C)   TempSrc: Temporal   Resp: 14   Weight: 237 lb 3.2 oz (107.593 kg)     General: NAD.   HEENT: MMM  Right lower extremity: no erythema, swelling, warmth, or tenderness. No edema. Full ROM of all digits.   Neuro: Normal gait.       A/P  274.9 Interval gout  (primary encounter diagnosis)  Comment: History of episodes of acute onset of monoarticular joint pain, tenderness, warmth, and erythema which peak within 24-48 hours and resolve within approx 72 hours in a 77 yo obese male with a history of HTN and CAD who is currently on aspirin is concerning for gout, esp with serum uric acid of 7.9. Currently asymptomatic (interval gout). So far, two attacks.  Pt provided with Up To Date article on Gout (patient education).    Plan:   Will work on modifiable risk factors (diet and exercise).   If continues to have attacks, then will consider xray of both feet to assess for tophi, joint aspiration to eval for crystals, and discuss prophylaxis with allopurinol.  Patient instructions:   1. Keep a journal of your symptoms related to your feet and joints.   - frequency of attacks, how long do they last, what makes them better or worse, where do you notice the pain?    2. If you develop additional attacks, then we will likely obtain an x ray of your feet and discuss maybe starting medicines to help prevent additional attacks.     V66.7 End of life care  Comment: Patient with numerous significant comorbidities (COPD, CAD s/p stent placement 2012, HTN, CKD, thoracic aneurysm, etc). In the context of recent hemoptysis and bronch, discussed coping (going okay) and end of life care. Patient has discussed some of these issues with wife (plan for cremation; has a financial will which is up to date; has filled out an advance directive; eg). Has not yet discussed code status, ICU care, etc with PCP. Discussed today that a conversation with  Dr. Burgess Estelle or myself regarding preferences regarding care should his health continue to deteriorate would be reasonable.   Plan: Pt provided with POLST, [employer], DPOA forms, advance directive. Will review with family and bring back to clinic to discuss.      Next visit: Blood pressure review. Pt currently above goal stated in problem list.     FYI to PCP Dr. Burgess Estelle.

## 2011-09-02 ENCOUNTER — Ambulatory Visit (HOSPITAL_BASED_OUTPATIENT_CLINIC_OR_DEPARTMENT_OTHER): Payer: PRIVATE HEALTH INSURANCE | Admitting: Internal Medicine

## 2011-09-02 ENCOUNTER — Ambulatory Visit: Payer: PRIVATE HEALTH INSURANCE | Attending: Internal Medicine

## 2011-09-02 ENCOUNTER — Other Ambulatory Visit (HOSPITAL_BASED_OUTPATIENT_CLINIC_OR_DEPARTMENT_OTHER): Payer: Self-pay | Admitting: Internal Medicine

## 2011-09-02 DIAGNOSIS — R042 Hemoptysis: Secondary | ICD-10-CM | POA: Insufficient documentation

## 2011-09-02 DIAGNOSIS — R918 Other nonspecific abnormal finding of lung field: Secondary | ICD-10-CM | POA: Insufficient documentation

## 2011-09-02 LAB — CBC (HEMOGRAM)
Hematocrit: 41 % (ref 38–50)
Hemoglobin: 13.4 g/dL (ref 13.0–18.0)
MCH: 29.1 pg (ref 27.3–33.6)
MCHC: 32.6 g/dL (ref 32.2–36.5)
MCV: 89 fL (ref 81–98)
Platelet Count: 168 10*3/uL (ref 150–400)
RBC: 4.61 mil/uL (ref 4.40–5.60)
RDW-CV: 14.8 % — ABNORMAL HIGH (ref 11.6–14.4)
WBC: 5.11 10*3/uL (ref 4.3–10.0)

## 2011-09-02 LAB — BASIC METABOLIC PANEL
Anion Gap: 8 (ref 3–11)
Calcium: 9.6 mg/dL (ref 8.9–10.2)
Carbon Dioxide, Total: 24 mEq/L (ref 22–32)
Chloride: 109 mEq/L — ABNORMAL HIGH (ref 98–108)
Creatinine: 1.64 mg/dL — ABNORMAL HIGH (ref 0.51–1.18)
GFR, Calc, African American: 50 mL/min — ABNORMAL LOW (ref >59)
GFR, Calc, European American: 41 mL/min — ABNORMAL LOW (ref >59)
Glucose: 119 mg/dL (ref 62–125)
Potassium: 4.8 mEq/L (ref 3.7–5.2)
Sodium: 141 mEq/L (ref 136–145)
Urea Nitrogen: 30 mg/dL — ABNORMAL HIGH (ref 8–21)

## 2011-09-02 LAB — PR DIAGNOSTIC COMPUTED TOMOGRAPHY THORAX W/O CNTRST

## 2011-09-02 NOTE — Progress Notes (Signed)
-------------------------------------------    Attending: Geisha Abernathy M Shamyia Grandpre, MD  I have reviewed the resident's note and agree with the findings, assessment and plan documented in the note..  -------------------------------------------

## 2011-09-03 LAB — ASPERGILLUS FUMIGATUS PCR: Culture: NOT DETECTED

## 2011-09-03 LAB — MORPHOLOGIC EVAL: LEUKEMIA/LYMPHOMA, FLUID
Cytocentrifuge, FLD: NOT DETECTED
Cytocentrifuge, FLD: NOT DETECTED
Cytocentrifuge, FLD: NOT DETECTED

## 2011-09-03 LAB — ANTI GLOMERULAR BASEMENT MEMBRANE BTY
Anti GBM Comment: NEGATIVE
Anti GBM Level: <0.2 AI (ref 0.0–0.7)

## 2011-09-03 LAB — ANTI NEUTROPHIL CYTOPLASM PNL
Anti MPO Comment: NEGATIVE
Anti MPO Level: <0.2 AI (ref 0.0–0.7)
Anti PR3 Comment: NEGATIVE
Anti PR3 Level: <0.2 AI (ref 0.0–0.7)
Antineutrophil Cytoplasmic Antibody by IF: NEGATIVE

## 2011-09-03 LAB — ANA REFLEX COMPREHENSIVE PANEL
ANA: NEGATIVE
Ana Interpretation Comment 1: NEGATIVE
Ana Interpretation Comment 1: NEGATIVE
Ana Screen By Multiplex: NEGATIVE

## 2011-09-03 LAB — ANTI PHOSPHOLIPID PANEL
Anti Beta 2 Glycoprotein 1 IgM: <9 SMU (ref 0–20)
Anti Beta 2 Glycoprotein 1, IgG: 9 SGU (ref 0–9)
IgA Anti Cardiolipin: <9 [APL'U] (ref 0–11)
IgG Anti Cardiolipin: <9 [GPL'U] (ref 0–14)
IgM Anti Cardiolipin: <9 [MPL'U] (ref 0–14)

## 2011-09-16 ENCOUNTER — Ambulatory Visit (HOSPITAL_BASED_OUTPATIENT_CLINIC_OR_DEPARTMENT_OTHER): Payer: PRIVATE HEALTH INSURANCE | Attending: Sleep Medicine

## 2011-09-16 DIAGNOSIS — G4737 Central sleep apnea in conditions classified elsewhere: Secondary | ICD-10-CM | POA: Insufficient documentation

## 2011-09-16 DIAGNOSIS — G4733 Obstructive sleep apnea (adult) (pediatric): Secondary | ICD-10-CM | POA: Insufficient documentation

## 2011-09-17 LAB — CULTURE:VIRAL RESP. & FA
Adenovirus FA: NEGATIVE
Influenza A Virus FA: NEGATIVE
Influenza B Virus FA: NEGATIVE
Metapneumovirus FA: NEGATIVE
Parainfluenza Virus FA: NEGATIVE
RSV FA: NEGATIVE

## 2011-09-25 LAB — LOWER RESP FUNGAL W/DIR. EXAM: Stain For Fungus: NONE SEEN

## 2011-09-30 ENCOUNTER — Encounter (INDEPENDENT_AMBULATORY_CARE_PROVIDER_SITE_OTHER): Payer: Self-pay

## 2011-10-26 LAB — AFB CULTURE W/STAIN: AFB Stain: NONE SEEN

## 2011-11-02 ENCOUNTER — Ambulatory Visit: Payer: PRIVATE HEALTH INSURANCE | Attending: Internal Medicine

## 2011-11-02 ENCOUNTER — Other Ambulatory Visit (HOSPITAL_BASED_OUTPATIENT_CLINIC_OR_DEPARTMENT_OTHER): Payer: Self-pay | Admitting: Internal Medicine

## 2011-11-02 DIAGNOSIS — J8410 Pulmonary fibrosis, unspecified: Secondary | ICD-10-CM | POA: Insufficient documentation

## 2011-11-02 LAB — PR DIAGNOSTIC COMPUTED TOMOGRAPHY THORAX W/O CNTRST

## 2011-11-05 ENCOUNTER — Ambulatory Visit: Payer: PRIVATE HEALTH INSURANCE | Attending: Sleep Medicine | Admitting: Internal Medicine

## 2011-11-05 DIAGNOSIS — G4733 Obstructive sleep apnea (adult) (pediatric): Secondary | ICD-10-CM | POA: Insufficient documentation

## 2011-11-11 ENCOUNTER — Encounter (HOSPITAL_BASED_OUTPATIENT_CLINIC_OR_DEPARTMENT_OTHER): Payer: PRIVATE HEALTH INSURANCE | Admitting: Internal Medicine

## 2012-01-05 ENCOUNTER — Ambulatory Visit: Payer: PRIVATE HEALTH INSURANCE | Attending: Cardiovascular Disease | Admitting: Cardiovascular Disease

## 2012-01-05 DIAGNOSIS — I9589 Other hypotension: Secondary | ICD-10-CM | POA: Insufficient documentation

## 2012-01-05 DIAGNOSIS — I2510 Atherosclerotic heart disease of native coronary artery without angina pectoris: Secondary | ICD-10-CM | POA: Insufficient documentation

## 2012-01-05 DIAGNOSIS — I10 Essential (primary) hypertension: Secondary | ICD-10-CM | POA: Insufficient documentation

## 2012-01-05 DIAGNOSIS — N19 Unspecified kidney failure: Secondary | ICD-10-CM | POA: Insufficient documentation

## 2012-01-05 DIAGNOSIS — I719 Aortic aneurysm of unspecified site, without rupture: Secondary | ICD-10-CM | POA: Insufficient documentation

## 2012-01-05 DIAGNOSIS — J4489 Other specified chronic obstructive pulmonary disease: Secondary | ICD-10-CM | POA: Insufficient documentation

## 2012-02-02 ENCOUNTER — Other Ambulatory Visit (HOSPITAL_BASED_OUTPATIENT_CLINIC_OR_DEPARTMENT_OTHER): Payer: Self-pay | Admitting: Cardiovascular Disease

## 2012-02-02 ENCOUNTER — Ambulatory Visit: Payer: PRIVATE HEALTH INSURANCE | Attending: Cardiovascular Disease | Admitting: Cardiovascular Disease

## 2012-02-02 DIAGNOSIS — N19 Unspecified kidney failure: Secondary | ICD-10-CM | POA: Insufficient documentation

## 2012-02-02 DIAGNOSIS — R031 Nonspecific low blood-pressure reading: Secondary | ICD-10-CM | POA: Insufficient documentation

## 2012-02-02 DIAGNOSIS — I10 Essential (primary) hypertension: Secondary | ICD-10-CM | POA: Insufficient documentation

## 2012-02-02 DIAGNOSIS — J4489 Other specified chronic obstructive pulmonary disease: Secondary | ICD-10-CM | POA: Insufficient documentation

## 2012-02-02 DIAGNOSIS — I252 Old myocardial infarction: Secondary | ICD-10-CM | POA: Insufficient documentation

## 2012-02-02 DIAGNOSIS — E785 Hyperlipidemia, unspecified: Secondary | ICD-10-CM | POA: Insufficient documentation

## 2012-02-02 DIAGNOSIS — I719 Aortic aneurysm of unspecified site, without rupture: Secondary | ICD-10-CM | POA: Insufficient documentation

## 2012-02-02 LAB — BASIC METABOLIC PANEL
Anion Gap: 11 (ref 3–11)
Calcium: 9.5 mg/dL (ref 8.9–10.2)
Carbon Dioxide, Total: 22 mEq/L (ref 22–32)
Chloride: 106 mEq/L (ref 98–108)
Creatinine: 2.15 mg/dL — ABNORMAL HIGH (ref 0.51–1.18)
GFR, Calc, African American: 37 mL/min — ABNORMAL LOW (ref >59)
GFR, Calc, European American: 30 mL/min — ABNORMAL LOW (ref >59)
Glucose: 132 mg/dL — ABNORMAL HIGH (ref 62–125)
Potassium: 4.7 mEq/L (ref 3.7–5.2)
Sodium: 139 mEq/L (ref 136–145)
Urea Nitrogen: 39 mg/dL — ABNORMAL HIGH (ref 8–21)

## 2012-02-05 ENCOUNTER — Encounter (HOSPITAL_BASED_OUTPATIENT_CLINIC_OR_DEPARTMENT_OTHER): Payer: Self-pay | Admitting: Physical Medicine & Rehabilitation

## 2012-02-05 ENCOUNTER — Ambulatory Visit
Payer: PRIVATE HEALTH INSURANCE | Attending: Physical Medicine & Rehabilitation | Admitting: Physical Medicine & Rehabilitation

## 2012-02-05 ENCOUNTER — Telehealth (HOSPITAL_BASED_OUTPATIENT_CLINIC_OR_DEPARTMENT_OTHER): Payer: Self-pay | Admitting: Physical Medicine & Rehabilitation

## 2012-02-05 VITALS — BP 124/65 | HR 83 | Ht 69.25 in | Wt 224.4 lb

## 2012-02-05 DIAGNOSIS — M48061 Spinal stenosis, lumbar region without neurogenic claudication: Secondary | ICD-10-CM | POA: Insufficient documentation

## 2012-02-05 DIAGNOSIS — M48062 Spinal stenosis, lumbar region with neurogenic claudication: Secondary | ICD-10-CM | POA: Insufficient documentation

## 2012-02-05 NOTE — Progress Notes (Signed)
SPORTS MEDICINE   STEROID INJECTION INSTRUCTIONS      PATIENT: Tracy Conway    [id number]  Date of Procedure:  02/18/12  Provider performing procedure:  MD Harrast   Patient to follow-up with:  MD Harrast   Procedure to be performed (including side):  midline Caudal    Diagnostic Procedure Only:  NO  REQUIRED IMAGING IN PACS:  yes   Patient reminded to bring MRI to appointment if completed outside the [facility] system:  no    PRE-INJ GENERAL INSTRUCTIONS:    Patient instructed to check in at the [facility], located on the 4th floor at the [facility] ([address] [place], [place], [place] [address]):  yes   Patient told to eat a light breakfast/lunch, but NPO 1 hr prior to procedure:  yes   Patient instructed on ride home:  yes   Patient instructed to call the [facility] @ [phone] and speak to the nurse:   1. If experincing any cold symptoms such fevers, chills, night sweating or coughing:  yes  2. If taking any antibiotics or being treated for an infection:  yes  3. If he has any additional questions BEFORE the INJ: yes  4. For day of the procedure, patient instructed to call the [facility] @ [phone]:  yes  Patient advised to take all medications as usually taken throughout the day (including blood pressure medications and diabetes medications), with the exception of any blood-thinning medications.  PATIENT ADVISED TO DISCONTINUE ANTI-COAG MEDS (**Please see List Below**):  Plavix -- stop for 7 days and holding and ASA 325 MG 7 days before the injection will need to ask MD Garnette Czech at [facility]   **ANTI-COAG/MED GUIDELINES TO REVIEW WITH PATIENT BEFORE INJECTION PROCEDURES**  AXIAL INJECTIONS:   NSAIDs:  do not need to hold   ASA:  o < 300mg  ~ do not need to hold  o >/= 300mg  ~ hold for 7days   Clopidrogel (Plavix) ~ hold for 7days   Ticlopidine (Ticlid) ~ hold for 14days   Warfarin (Coumadin) ~ hold for 5days, check rapid INR on that day,   o OK to proceed if  INR </= 1.5   Aggrenox (ASA and Dipyridamole) ~ hold 7days   Dipyridamole (Persantine) ~ hold 7days   Tirofiban (Aggrastat) ~ hold 2days   Abciximab (ReoPro) ~ hold 2days   Eptifibatide (Integrilin) ~ hold 2days   Pletal and Trental  ~ hold for 2days   Orgaran ~ hold for 5days   Heparin, Lovenox, Innohep, Fragmin, Normiflo ~ hold for 24 hours   Vitamin E (greater than 400 IU daily) ~ hold for 7days   All herbals ~ hold for 7days  INTRA-ARTICULAR HIP/SHOULDER/KNEE, BURSAL/TENDON INJECTIONS:  Per discretion of provider   **OK to restart all medications evening following procedure.    ALL MEDICATION ALLERGIES REVIEWED BY:  Jamison Neighbor, RN on 02/05/2012    ADDITIONAL INFORMATION (If "YES" to any questions, notify RN or MD immediately):   Contrast / Iodine / Shellfish Allergy?  NO  1. Pre-Medication Ordered:  NO  2. IV: NO   Pre-Procedure STAT Rapid INR Needed?  NO  1. STAT Rapid INR Ordered:  NO   Patient is diabetic?  No  Patient is/may be pregnant?  No    POST-INJ GENERAL INSTRUCTIONS:    PATIENT INSTRUCTED TO ICE THE INJ SITE 10-15 minutes approximately 3-4 times a day on the day of the INJ, and  10-15 minutes 3-4 times on the day following the INJ to assist with local discomfort:  yes     PATIENT INSTRUCTED TO CALL THE [facility] CL[facility]IC immediately if he develops severe pain, redness, warmth, or swelling around the INJ site, shaking, chills, or fevers greater than 100F.:  yes     PATIENT INSTRUCTED THAT ACTIVITY SHOULD BE LIMITED on the day of the INJ and physical therapy and other exercises should be delayed for three (3) days following the INJ.  However, return to usual daily activities is expected the day after the INJ.  Typically, if the patient is working, he can expect to return to work the day after the INJ:   yes     IF PATIENT NEEDS ADDITIONAL MEDICATION, if required, it should be obtained from his referring physician:  yes     PATIENT INSTRUCTED THAT IF he HAS ANY CONCERNS, INCLUDING BUT NOT  LIMITED TO, worsening pain, numbness, leg weakness or significant bladder/bowel changes, please call our office immediately at [phone] and ask to speak to our Nurse:  yes     PATIENT TO FOLLOW-UP instructed:  yes      All of the patient's questions were answered.    Jamison Neighbor, RN

## 2012-02-05 NOTE — Patient Instructions (Addendum)
Orthopedic referral  Caudal epidural steroid injection

## 2012-02-05 NOTE — Progress Notes (Signed)
[facility] & [facility]  [address] [place], [address]  [place], [place]  [address]  TEL: [phone]    FAX: [phone]  [email or web address]    Specializing in sports-related injuries and non-surgical care for conditions of the spine,  shoulder, elbow, wrist, hand, hip, knee, foot, and ankle.    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     HISTORY OF PRESENT ILLNESS:  Tracy Conway is a very pleasant 77 year old year old male last seen 03/23/11 for L4/5 advanced central stenosis with neurogenic claudication. Since last visit, pt feels his low back pain is worsening. He was provided pool therapy prescription which he attended through January. He felt improvement in symptoms each day he had therapy, however symptoms returned to baseline on days he did not have therapy. He continues to have pain focused in the low back with pain radiating down the posterior aspect of both LEs. Pain is described as a deep ache in the low back and posterior LEs in the morning, with LE symptoms improving 30-45 minutes after waking. Low back ache continues through the day. He also has occasional shooting pain down the posterior leg to the knee B/L through the day. Overall, he estimates pain is twice as bad as at last visit. He denies numbness/tingling in the LEs. Denies weakness in the LEs and has not noted a change in the weight he is able to lift when at the gym. He is no longer going to the gym regularly as his back pain limits him significantly in what he is able to do. Denies bowel incontinence. Has occasional bladder leakage following prostate surgery in summer of 2012. Denies saddle anesthesia.    ALLERGIES:   Review of patient's allergies indicates:  Allergies   Allergen Reactions   . Penicillin G      hives         MEDICATIONS:   Current Outpatient Prescriptions   Medication Sig   .  Aspirin 325 MG Oral Tab Take 1 tablet by mouth daily   . Benazepril HCl 20 MG Oral Tab Take 1 tablet by mouth daily   . Clopidogrel Bisulfate 75 MG Oral Tab    . Hydrocodone-Acetaminophen 10-300 MG Oral Tab 1-2 tab po bid prn severe pain   . Metoprolol Succinate 100 MG Oral TABLET SR 24 HR    . Omega 3 1200 MG Oral Cap    . Pantoprazole Sodium 40 MG Oral Pack 1 tab PO BID.   . Salmeterol Xinafoate (SEREVENT DISKUS) 50 MCG/DOSE Inhalation AEROSOL POWDER, BREATH ACTIVATED 1 inh bid   . Simvastatin 40 MG Oral Tab Take 1 tablet by mouth every evening for cholesterol   . Tamsulosin HCl 0.4 MG Oral Cap TAKE 1 CAPSULE BY MOUTH AT BEDTIME   . Tiotropium Bromide Monohydrate (SPIRIVA HANDIHALER) 18 MCG Inhalation Cap None Entered       FAMILY/SOCIAL HISTORY:   No changes in medical, family or social history since last visit.     REVIEW OF SYSTEMS:  The patient intake form (dated 02/05/2012) was reviewed with the patient. Pertinent positives include nothing new. The remaining systems reviewed were negative. There is no change from the initial intake from a year ago.    PHYSICAL EXAMINATION:  VITAL SIGNS:  BP 124/65  Pulse 83  Ht 5' 9.25" (1.759 m)  Wt  224 lb 6.4 oz (101.787 kg)  BMI 32.90 kg/m2  SpO2 89%.  GENERAL:  He is Well-Developed and Well Nourished. They are alert and oriented.  PSYCHOLOGICAL:  No acute distress. Mood euthymic. Normal affect.  HEENT: Normal cephalic, atraumatic. Non-interic sclera. Moist mucous membranes   RESPIRATORY:  Non-labored breathing  CARDIOVASCULAR: No cyanosis, clubbing or edema. Intact peripheral pulses.  SKIN: No atrophy, rashes or open wounds involving the areas examined.   Neurologic: CN II-XII grossly intact. Strength is grossly 5 out of 5 throughout bilateral lower extremities. Sensation was intact in the bilateral lower extremities.  Reflexes were within normal limits in the bilateral lower extremities. Babinski is negative with downgoing toes. Clonus is negative. Musculoskeletal:  Inspection: Normal spinal alignment, muscle bulk, and pelvic symmetry. Range of Motion:  Lumbar spine: limited extension 2/2 pain at 15 degrees extension. Pain reproduced with L lateral flexion at 20 deg. Hip: internal and external rotation intact bilaterally. Palpation:  There is mild tenderness to palpation at the L4/5 paraspinal area with L>R.  Special Tests:  Slump and SLR positive on L. FABERs with pain reproduction in the low back B/L as well as limited to 45 degrees B/L. Facet loading positive L>R Functional: Gait is non-antalgic. Core strength is abnormal.    IMAGING/DIAGNOSTIC RESULTS:    MRI L spine 03/17/11 reviewed: Multilevel degenerative change, most prominent at L4-5 where moderate to severe central canal stenosis is present. Multilevel foraminal narrowing as above.       IMPRESSION:  1. L4/5 central stenosis  2. Neurogenic claudication  3. Low back pain    DISCUSSION/PLAN:  This is a very pleasant 77 yo male with advanced L4/5 central stenosis with neurogenic claudication. He has prominent lumbar facet arthropathy additionally and pain resulting from each of these conditions. Given worsening of his symptoms since last visit despite conservative management, we discussed the next steps with the patient. We discussed the probable need for surgery in the future and expectations from this, as well as complications given his comorbidities. We also discussed other conservative therapies including ESI. It is appropriate to pursue both of these therapies at this time given continued worsening of symptoms. Concern at last visit for [facility] was pt's plavix, which he now states he is able to stop 1 week before scheduled ESI. We will schedule him for [facility] today for a caudal injection. We also placed referral to ortho spine for evaluation of surgical management. Plan is as below:    Recommendations are as follows:  1. Imaging. MRI reviewed from 03/16/12  2. Activity modification. Patient was counseled to remain  active, but avoid activities that worsen symptoms.   3. Medications. None at this time  4. Interventional procedures. Referral for caudal ESI placed.  5. Referrals/consults. Ortho spine for surgical management of lumbar stenosis  6. Follow-up. After ESI  7. All the patient's questions were answered.     This patient was seen and examined with the attending physician, Dr. Ellin Goodie, who verified the salient portions of the history and physical examination and developed the assessment and plan.     Bing Neighbors, MD  Resident Physician, Rehabilitation Medicine    -------------------------------------------   Attending: Dorene Ar, MD   I saw and evaluated the patient and agree with Dr. Pearla Dubonnet note.   Additional diagnoses: none   ----------------------------------------

## 2012-02-05 NOTE — Telephone Encounter (Signed)
Patient is a 77 years old male with L4/5 central stenosis, Neurogenic claudication, and Low back pain. Patient was last seen on 10/11/3 and is currently scheduled for a ESI on 02/18/12 with MD Harrast.    Patient had a Coronary angiography FFR, PCI, and LAD  Done 03/04/11 with MD Jarold Motto.     Per Epidural Steroid injection (ESI)  anti-coag/medication guideline. Patient is current lt taking plavix 75mg  po QD and Aspirin 325 mg pos QD both of theses medications needs to be held 7 days prior to the [facility]. Will need is speak with MD Jarold Motto.    Jamison Neighbor RN  [facility] at [facility]

## 2012-02-08 NOTE — Telephone Encounter (Signed)
Called the patient and gave him the information stated below.     Patient states "I talked with MD Angelena Sole last time I saw him and he told me that I could hold this medications and get the injection done. I have already stopped taking the Plavix and ASA per MD Angelena Sole. I have been going to the [facility] for years now. I have been in a lot of pain for 1 year. Couple of weeks is not going to make a difference for me in getting the ESI. I already stopped the medication. I need this injection to help me with the pain. 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 This is total b.s. for me to wait couple weeks for this injection. Ask MD Garnette Czech this message from me".    Advised him that we will ask MD Angelena Sole and call you back. He was agreeable with this.     Jamison Neighbor RN  [facility] at [facility]

## 2012-02-08 NOTE — Telephone Encounter (Signed)
I spoke with Dr. Angelena Sole again today and informed him pt had stopped taking his meds as noted below in anticipation of having the steroid injection.  Dr. Angelena Sole stated he did speak with the pt however told him he advised him NOT to stop taking his meds until it had been a full year since his stent placement, which would be Nov 7 of this year, and if he stopped taking the meds earlier it would be at his own risk and against medical advise.  I attempted to reach the pt by phone to discuss this further and received his VM.  I left a non detailed msg asking him to return my call at [phone].    Tommie Raymond, RN3  [facility]  At [facility]

## 2012-02-08 NOTE — Telephone Encounter (Signed)
Called MD Jarold Motto Friday 02/05/12 and he advises would like the patient to wait for at 1 full year post stent procedure (took place on 03/04/11). He can hold Plavix and ASA  7 days before the ESI, and no bridging is needed. RBTO    Tommie Raymond, RN3  [facility]  At [facility]

## 2012-02-09 ENCOUNTER — Encounter (HOSPITAL_BASED_OUTPATIENT_CLINIC_OR_DEPARTMENT_OTHER): Payer: Self-pay | Admitting: Orthopaedic Surgery

## 2012-02-09 ENCOUNTER — Ambulatory Visit: Payer: PRIVATE HEALTH INSURANCE | Attending: Physician Assistant | Admitting: Orthopaedic Surgery

## 2012-02-09 VITALS — BP 99/55 | HR 82 | Ht 70.5 in | Wt 230.5 lb

## 2012-02-09 DIAGNOSIS — M545 Low back pain, unspecified: Secondary | ICD-10-CM | POA: Insufficient documentation

## 2012-02-09 DIAGNOSIS — M48061 Spinal stenosis, lumbar region without neurogenic claudication: Secondary | ICD-10-CM | POA: Insufficient documentation

## 2012-02-09 LAB — PR RADEX SPINE LUMBOSACRAL 2/3 VIEWS

## 2012-02-09 NOTE — Telephone Encounter (Signed)
Pt returned my call today and has verbalized understanding of the need to wait until Nov 7th to hold his Plavix and ASA 7 days prior to [facility] per Dr. Samantha Crimes instructions.  He has agreed to this plan and has been rescheduled to have his ESI on Mar 10, 2012.  Pt had no further questions at this time.    Tommie Raymond, RN3  [facility]  At [facility]

## 2012-02-09 NOTE — Progress Notes (Signed)
HPI: Tracy Conway is a 77 year old male with COPD, hx prostate CA, s/p stents placement in 2012 whose chief concern is a 18 month history of right lower back pain and decline in walking tolerance.  Pt is referred by Dr. Percell Boston of Sports and Sp[facility] dept for surgical consultation of L4-5 stenosis.    Per pt, he is able to walk 10-12 minutes on the treadmill  @1 .8 mile/hour; pt states 3 months ago, he was doing 20-25 minutes on the treadmill 2.5 mile/hour.  He states the left lower back is what bothers him the most.  He states he does not have leg pain.  He notes leg pain in the morning when he gets up but not when he walks; it is mostly the left lower back that bothers him the most and he states he is just "tired of the pain" and would like to pursue surgical intervention so he can get back to the gym to workout.    When going grocery shopping, pt likes to lean on the cart.  He is able to stand about 10 minutes before needing to rest.    He tried pool therapy and it did help for a few hours after the exercise.  Pt has an ESI scheduled next Thursday, this will be his first injection; he stopped Plavix yesterday.  He will be off Plavix officially on 03/03/2012 after he completes one year treatment.    He rates the pain as 6-72/10 in severity.  The patient reports that the pain is improved with rest and worsened with standing and walking.  The patient has been treated previously with pool therapy and did get relief with pool therapy.      Patient denies b/b incontinence, denies leg weakness.    Past Medical History   Diagnosis Date   . Aortic aneurysm of unspecified site without mention of rupture      Aortic Aneurysm   . MALIG NEOPLASM DORSAL TONGUE(aka CANCER) 03/29/2002     Ssurgery 1994, T1N1 squamous cell ca L tongue, partial glossectomy; L supraorbital hyoid neck dissection    . Tobacco use disorder 12/24/2005     Quit 2000.   Marland Kitchen Unspecified hearing loss 12/24/2005     Bilat hearing aids.   . ECZEMA   12/24/2005     Lower extremities in particular.   Marland Kitchen ERECTILE DYSFUNCTION 12/24/2005   . Benign neoplasm of colon 12/24/2005     Tubular adenoma. Dx'ed flex sig 2000--> colonoscopy 2000: N.  F/U 2003: mild diverticulosis, tubular adenoma.  F/U due 2008 with CT colonography.   . Other specified disorders resulting from impaired renal function 07/22/2005     Creat ~ 1.2   . Calculus of kidney 12/24/2005     Lithotripsy 1988   . 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 Thoracic aneurysm without mention of rupture 12/24/2005     Ascending aortic aneurysm.  Followed by Dr. Brunilda Payor, Cardiac surgery.  Gets regular CT scans.  Goal systolic BP: 100-120   . Abdominal aneurysm without mention of rupture 12/24/2005     Borderline, seen on CT 8/06.   . Ventricular septal defect 12/24/2005     Small muscular VSD--NEEDS SBE PROPHYLAXIS. (Clindamycin 600 mg prior to dental visits.)   . Hypertrophy of prostate without urinary obstruction and other lower urinary tract symptoms (LUTS) 12/24/2005   . Generalized osteoarthrosis, unspecified site 01/07/2007     Has seen orthopedics--cortisone shot to L knee helped.   Marland Kitchen  Diverticulitis of colon (without mention of hemorrhage) 01/07/2007     Hospitalized at [facility] May 2008   . Unspecified disorder of muscle, ligament, and fascia    . Sleep apnea      Past Surgical History   Procedure Date   . Ventral hernia repair      x3   . Removal of tongue carcinoma with lymphadenectomy    . Fusion l foot distal phalange    . Lithotrypsy of r renal calculi    . Retinaculum release b/l wrists    . Vasectomy w/tray      x2   . Vasectomy reversal    . Rpr ing hernia, init, reduce    . Revise knee joint replace,all parts    . Ligation of sperm duct    . Brachy therapy[    . Lipotripsy surgery[      Family History   Problem Relation Age of Onset   . Cancer Father      Prostate   . Arthritis Father    . Other Father      Chronic Kidney Disease   . Psych Mother      Alzheimer's Disease   . Cancer Brother      Social History      Occupational History   . STAFF      Purchasing     Social History Main Topics   . Smoking status: Former Smoker -- 1.0 packs/day for 40 years     Quit date: 05/11/1998   . Smokeless tobacco: Never Used    Comment: quit three years ago   . Alcohol Use: 1.0 oz/week     2 drink(s) per week      quit 17 years ago   . Drug Use: No   . Sexually Active: Yes -- Male partner(s)      no protection     Allergies: Penicillin g  Latex allergy: No  Iodine allergy: No    He has a current medication list which includes the following prescription(s): aspirin, benazepril hcl, clopidogrel bisulfate, hydrocodone-acetaminophen, metoprolol succinate er, omega 3, pantoprazole sodium, salmeterol xinafoate, simvastatin, tamsulosin hcl, and tiotropium bromide monohydrate.    ROS: review of fourteen point of systems is negative except for glasses/contacts, hearing loss, HTN, shortness of breath, lung disease, heartburn kidney stone, prostate problems.    Physical exam:    GENERAL:  Tracy Conway is a 77 year old male in no apparent distress.     PSYCHIATRIC: The patient is alert and oriented.     PULMONARY: The patient has Nonlabored breathing    SKIN: Exam is abnormal of some bruising of bilat UE and LE.    Posture:  [erect]    Spine Exam:     LUMBAR SPINE    VASCULAR: dorsalis pedis pulses are palpable bilaterally.    MUSCULOSKELETAL:  The patient has no pain with range of motion of the right and left hip , knee and ankle .   NEUROLOGICAL:     SENSATION RIGHT LEFT   Back normal  normal    Anterior medial thigh (L1, L2, L3) normal  normal    Anterior lateral thigh/medial calf (L4) normal  normal    Lateral thigh, lateral calf (L5) normal  normal    Posterior thigh, calf (S1) normal  normal              MOTOR STRENGTH RIGHT LEFT   Hip Flexion 5/5 5/5   Knee extension 5/5 5/5  Knee flexion  5/5 5/5   Dorsiflexion 5/5 5/5   EHL 5/5 5/5   Plantar flexion 5/5 5/5     REFLEXES     Knee  1+ 1+   Ankle  1+ 1+   Babinski absent absent    Clonus  0 beats 0 beats               Straight leg raise is negative on the right and left     Gait is normal     Pt is able to heel walk bilaterally.  Pt is able to toe walk bilaterally.  Pt is able with mild difficulty to tandem walk bilaterally    Imaging:      X-rays: lumbar spine flexion and extension views obtained today as part of preop planning.    MRI scan: 110/2012 L spine Dr. Nedra Hai personally reviewed the study and discussed with the patient.  L4-5 central canal stenosis noted.    Assessment:    Tracy Conway is a 77 year old male  with the clinical presentation of axial back pain and lumbar neurogenic claudication.      Plan:    Dr. Nedra Hai discussed with patient that he will likely benefit from a lumbar decompression procedure for treatment of L4-5 central canal stenosis.    Repeat MRI L-spine, since the last one is just about a year old  Med consult for risk stratification  Return for formal preop visit.    Patient is pleased with the current recommendation.    Dr. Nedra Hai saw and examined the patient in clinic today and formulated the above treatment plan.    ODI 40

## 2012-02-09 NOTE — Progress Notes (Signed)
I have seen and evaluated the patient and agree with the assessment and plan outlined above.  I have discussed the risks, benefits and potential complications of surgical vs. non surgical treatment extensively with the patient.  Risks discussed include, but are not limited to: infection, bleeding, nerve injury, return to the OR, spinal fluid leak, paralysis, stroke, heart attack, blindness and death.    I have discussed the goals of surgical treatment with the patient.  The primary goal of surgical treatment is the relief of extremity pain and improvement of walking and standing tolerance.  Back pain resolution, while ideal is not the primary goal of the procedure and the patient understands that there may be residual back pain after the surgery.    I have recommended a new MRI as his most recent one is dated.  His xrays do not demonstrate instability.  Pending the new MRI, I will likely recommend L45 laminotomies.

## 2012-02-09 NOTE — Telephone Encounter (Signed)
Called the patient and he was agreeable with having the ESI on 03/10/12 with MD Harrast and a reminder letter was mailed to his home. Closing the TE.    Jamison Neighbor RN  [facility]

## 2012-02-16 ENCOUNTER — Ambulatory Visit (INDEPENDENT_AMBULATORY_CARE_PROVIDER_SITE_OTHER): Payer: PRIVATE HEALTH INSURANCE | Admitting: Family Medicine

## 2012-02-16 VITALS — BP 97/52 | HR 75 | Temp 97.6°F | Resp 16 | Wt 221.4 lb

## 2012-02-16 LAB — X-RAY HIP UNILAT 1 VW RIGHT

## 2012-02-16 LAB — PR X-RAY PELVIS 1 OR 2VW

## 2012-02-16 NOTE — Progress Notes (Signed)
Vaccine Screening Questions      1.  Have you had a serious reaction or an allergic reaction to a vaccine?  NO    2.  Currently have a moderate or severe illness, including fever? (Don't Ask if vaccine   ordered by provider same day)  NO    3.  Ever had a seizure or a brain or other nervous system problem syndrome associated with a vaccine? (DTaP/TDaP/DTP pertinent) NO    4.  Is patient receiving  any live vaccinations today? (Varicella-Chickenpox, MMR-Measles/Mumps/Rubella, Zoster-Shingles)  NO    If YES to any of the questions above - Do NOT give vaccine.  Consult with RN or provider in clinic.  (#4 can be YES if all Live vaccine questions are answered NO)    If NO to all questions above - Patient may receive vaccine.    5.  Do you need to receive the Flu vaccine today? YES - Additional Flu Questions  Flu Vaccine Screening Questions:    Ever had a serious allergic reaction to eggs?  NO    Ever had Guillain-Barre syndrome associated with a vaccine? NO    Less than 6 months old? NO    If YES to any of the Flu questions above - NO Flu Vaccine to be given.  Patient may consult provider as needed.    If NO to all questions above - Patient may receive Flu Shot (IM)    If between 6 months and 35 years of age, was flu vaccine received last year?  N/A  If NO to above question:  . For the 2012-13 season, administer 2 doses (separated by at least 4 weeks) to children who are receiving influenza vaccine for the first time.   . For the 2013-14 season, follow dosing guidelines in the 2013 ACIP influenza vaccine recommendations     Flu Mist (Nasal) may be considered if patient is >2 yrs old and < 75 yrs old.    Do you wish to proceed with screening questions for Flu Mist?  NO     Vaccine information sheet(s) discussed, patient/parent/guardian verbalized understanding? YES     VIS given 02/16/2012 by Berneice Heinrich MA.      Vaccine given today without initial adverse effect. YES    Julene Schmalz

## 2012-02-16 NOTE — Progress Notes (Signed)
I have reviewed the resident's documentation and concur. Tom Courtny Bennison, MD, Attending Physician

## 2012-02-16 NOTE — Patient Instructions (Signed)
1. Warm baths or ice for hip pain. If symptoms worsen, please come back to clinic.  2. F/u for hip bruise in 1-2 weeks.  3. Use Clotrimazole twice per day for about 7-10 days. Same as lotrimin I believe.

## 2012-02-16 NOTE — Progress Notes (Signed)
------------------------------------------    Attending: Ferne Coe, MD  I saw and evaluated the patient with this resident.  I discussed the case with the resident.  Key findings based upon my evaluation and discussion:  77 yo male who c/o pruritic lesion on his ankle for several days.  Notes he has been using neosporin on it, but it is worsening. KOH of skin scraping is positive.  Will plan on topical tx.  Also fell hit right hip. Has large ecchymotic area with palpable large hematoma.  X-ray did not show fx.  Plan will be to observe.  May need to have hematoma aspirated.  Will RTC in 1-2 weeks. Willa Frater, MD  ----------------------------------------

## 2012-02-16 NOTE — Progress Notes (Signed)
SUBJECTIVE  Chief Complaint:   Rash and Hip pain    History of Present Illness:  Tracy Conway is a 77 year old male here to discuss the following:    1. Rash  Located at medial malleolus of left leg. Started about 2 weeks ago and is pruritic. Started off as small pink spot and progressed to more puffiness. Had scab on it. Has used neosporin. Has improved since last week. No inciting event. Had similar episode that was not quite as severe in the past, which improved without any intervention. Had athlete's foot in past, but has no recent exposure to public showers/gyms.    2. R Hip Pain  Larey Seat one week prior. Was walking upstairs and tripped on vacuum and fell about 6 stairs. Fell directly onto hip and hit shoulder on wall. Very painful on first day. No pain now, but sore when sleeping on it. Very large hematoma located over right greater trochanter. Hasn't tried any ice/pain meds for issue. Did not hit head or have any LOC. Is taking Plavix for stents placed in June.     Past Medical History:  Past Medical History   Diagnosis Date   . Aortic aneurysm of unspecified site without mention of rupture      Aortic Aneurysm   . MALIG NEOPLASM DORSAL TONGUE(aka CANCER) 03/29/2002     Ssurgery 1994, T1N1 squamous cell ca L tongue, partial glossectomy; L supraorbital hyoid neck dissection    . Tobacco use disorder 12/24/2005     Quit 2000.   Marland Kitchen Unspecified hearing loss 12/24/2005     Bilat hearing aids.   . ECZEMA  12/24/2005     Lower extremities in particular.   Marland Kitchen ERECTILE DYSFUNCTION 12/24/2005   . Benign neoplasm of colon 12/24/2005     Tubular adenoma. Dx'ed flex sig 2000--> colonoscopy 2000: N.  F/U 2003: mild diverticulosis, tubular adenoma.  F/U due 2008 with CT colonography.   . Other specified disorders resulting from impaired renal function 07/22/2005     Creat ~ 1.2   . Calculus of kidney 12/24/2005     Lithotripsy 1988   . 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 Thoracic aneurysm without mention of rupture 12/24/2005    Ascending aortic aneurysm.  Followed by Dr. Brunilda Payor, Cardiac surgery.  Gets regular CT scans.  Goal systolic BP: 100-120   . Abdominal aneurysm without mention of rupture 12/24/2005     Borderline, seen on CT 8/06.   . Ventricular septal defect 12/24/2005     Small muscular VSD--NEEDS SBE PROPHYLAXIS. (Clindamycin 600 mg prior to dental visits.)   . Hypertrophy of prostate without urinary obstruction and other lower urinary tract symptoms (LUTS) 12/24/2005   . Generalized osteoarthrosis, unspecified site 01/07/2007     Has seen orthopedics--cortisone shot to L knee helped.   . Diverticulitis of colon (without mention of hemorrhage) 01/07/2007     Hospitalized at [facility] May 2008   . Unspecified disorder of muscle, ligament, and fascia    . Sleep apnea        Past Surgical History:  Past Surgical History   Procedure Date   . Ventral hernia repair      x3   . Removal of tongue carcinoma with lymphadenectomy    . Fusion l foot distal phalange    . Lithotrypsy of r renal calculi    . Retinaculum release b/l wrists    . Vasectomy w/tray      x2   .  Vasectomy reversal    . Rpr ing hernia, init, reduce    . Revise knee joint replace,all parts      left knee   . Ligation of sperm duct    . Brachy therapy    . Lipotripsy surgery    . Heart stent placement 09/2010, 02/2011     [facility]       Current Medications:  Outpatient Prescriptions Prior to Visit   Medication Sig Dispense Refill   . Aspirin 325 MG Oral Tab Take 1 tablet by mouth daily  90 Tab  3   . Benazepril HCl 20 MG Oral Tab Take 1 tablet by mouth daily  90 Tab  0   . Clopidogrel Bisulfate 75 MG Oral Tab     0   . Hydrocodone-Acetaminophen 10-300 MG Oral Tab 1-2 tab po bid prn severe pain  50 Tab  0   . Metoprolol Succinate 100 MG Oral TABLET SR 24 HR     0   . Omega 3 1200 MG Oral Cap     0   . Pantoprazole Sodium 40 MG Oral Pack 1 tab PO BID.       . Salmeterol Xinafoate (SEREVENT DISKUS) 50 MCG/DOSE Inhalation AEROSOL POWDER, BREATH ACTIVATED 1 inh bid       . Simvastatin 40 MG  Oral Tab Take 1 tablet by mouth every evening for cholesterol  90 Tab  4   . Tamsulosin HCl 0.4 MG Oral Cap TAKE 1 CAPSULE BY MOUTH AT BEDTIME  90 Cap  3   . Tiotropium Bromide Monohydrate (SPIRIVA HANDIHALER) 18 MCG Inhalation Cap None Entered            Allergies:  Review of patient's allergies indicates:  Allergies   Allergen Reactions   . Penicillin G      hives         Family History:  Family History   Problem Relation Age of Onset   . Cancer Father      Prostate   . Arthritis Father    . Other Father      Chronic Kidney Disease   . Psych Mother      Alzheimer's Disease   . Cancer Brother        Social History:  History     Social History   . Marital Status: Married     Spouse Name: N/A     Number of Children: 5   . Years of Education: N/A     Occupational History   . STAFF      Purchasing     Social History Main Topics   . Smoking status: Former Smoker -- 1.0 packs/day for 40 years     Quit date: 05/11/1998   . Smokeless tobacco: Never Used    Comment: quit three years ago   . Alcohol Use: 1.0 oz/week     2 drink(s) per week      quit 17 years ago   . Drug Use: No   . Sexually Active: Yes -- Male partner(s)      no protection     Other Topics Concern   . Not on file     Social History Narrative    From sept 2011 note from Dr. Garnette Czech, cardiology:  He has been married 3 times, 25 years to his current wife.  He has 5 daughters living and well although two of his daughters are rather overweight. He is working as a Naval architect. He  smoked one package of cigarettes a day from the age of 18 years until about 10 years ago, although for the last 5 years of smoking, he was down to about half a package of cigarettes a day.  He is an admitted alcoholic and quit drinking in 5784, at which time he attended AA meetings but not any more.[id number]: Married to [employer]. 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 Plans on spending more time woodworking. Goes to gym--exercises 20-30 mins  every other day. Ct-- Considering working for [employer]       Review of Systems:  Constitutional: Negative  Skin: Left foot rash  Rheum/MSK: Right hip pain  Neuro: Negative    OBJECTIVE  Physical Exam:    BP 97/52  Pulse 75  Temp 97.6 F (36.4 C) (Temporal)  Resp 16  Wt 221 lb 6 oz (100.415 kg)  SpO2 95%   General:  Well nourished, well-developed.  Well dressed and groomed.  AOx 4.  Extremities:  Warm and well-perfused.  No LE edema seen. No cyanosis.  Vascular: DP and PT pulses 2+ bilat  Skin:  Small 1.5x1cm oval shaped area of erythema on left medial malleolus with no surrounding edema. Has central area of redness with expanding border with crusting lesions.       Neuro: Muscle strength 5/5 bilat LE. Sensation grossly intact.  MSK: No decrease ROM in bilat hips, negative straight leg, negative FABER, significant tenderness to palpation on right greater trochanter with bulging area of skin 2/2 underlying large hematoma that extends from iliac crest to 10cm below greater trochanter.    ASSESSMENT/PLAN BY ISSUE:  924.01 Hip hematoma, right  (primary encounter diagnosis)  Comment: Xray pelvis negative for obvious fractures. There is a large underlying hematoma that would be easily drained if it does not improve over the next few weeks.  Plan: X-RAY PELVIS 1 VW, X-RAY HIP UNILAT 1 VW RIGHT, RTC in 1-2 weeks for follow up hematoma check and possible drainage if not improved.          110.4 Tinea pedis of left foot  Comment: Microscopy findings consistent with tinea pedis infection. Patient has clotrimazole cream at home already.  Plan: Use clotrimazole cream twice daily for next 7-10 days. RTC in 1-2 weeks to assure improvement.    V04.81 Need for prophylactic vaccination and inoculation against influenza  Comment:   Plan: INFLUENZA VACC, NO PRESERV 3 YRS AND OLDER         0.5ML/IM              Eunice Blase Epps, DO

## 2012-02-18 ENCOUNTER — Ambulatory Visit (HOSPITAL_BASED_OUTPATIENT_CLINIC_OR_DEPARTMENT_OTHER): Payer: PRIVATE HEALTH INSURANCE | Admitting: Physical Medicine & Rehabilitation

## 2012-02-26 ENCOUNTER — Ambulatory Visit (INDEPENDENT_AMBULATORY_CARE_PROVIDER_SITE_OTHER): Payer: PRIVATE HEALTH INSURANCE | Admitting: Family Medicine

## 2012-03-08 ENCOUNTER — Other Ambulatory Visit (HOSPITAL_BASED_OUTPATIENT_CLINIC_OR_DEPARTMENT_OTHER): Payer: Self-pay

## 2012-03-08 ENCOUNTER — Telehealth (HOSPITAL_BASED_OUTPATIENT_CLINIC_OR_DEPARTMENT_OTHER): Payer: Self-pay | Admitting: Orthopaedic Surgery

## 2012-03-08 ENCOUNTER — Ambulatory Visit (HOSPITAL_BASED_OUTPATIENT_CLINIC_OR_DEPARTMENT_OTHER): Payer: PRIVATE HEALTH INSURANCE

## 2012-03-08 ENCOUNTER — Other Ambulatory Visit (HOSPITAL_BASED_OUTPATIENT_CLINIC_OR_DEPARTMENT_OTHER): Payer: Self-pay | Admitting: Physician Assistant

## 2012-03-08 ENCOUNTER — Encounter (HOSPITAL_BASED_OUTPATIENT_CLINIC_OR_DEPARTMENT_OTHER): Payer: Self-pay | Admitting: Orthopaedic Surgery

## 2012-03-08 ENCOUNTER — Other Ambulatory Visit (HOSPITAL_BASED_OUTPATIENT_CLINIC_OR_DEPARTMENT_OTHER): Payer: Self-pay | Admitting: Orthopaedic Surgery

## 2012-03-08 ENCOUNTER — Ambulatory Visit: Payer: PRIVATE HEALTH INSURANCE | Attending: Physician Assistant | Admitting: Internal Medicine

## 2012-03-08 ENCOUNTER — Ambulatory Visit (HOSPITAL_BASED_OUTPATIENT_CLINIC_OR_DEPARTMENT_OTHER): Payer: PRIVATE HEALTH INSURANCE | Admitting: Orthopaedic Surgery

## 2012-03-08 VITALS — BP 90/49 | HR 78 | Wt 224.2 lb

## 2012-03-08 DIAGNOSIS — J438 Other emphysema: Secondary | ICD-10-CM | POA: Insufficient documentation

## 2012-03-08 DIAGNOSIS — M4800 Spinal stenosis, site unspecified: Secondary | ICD-10-CM | POA: Insufficient documentation

## 2012-03-08 DIAGNOSIS — M48061 Spinal stenosis, lumbar region without neurogenic claudication: Secondary | ICD-10-CM | POA: Insufficient documentation

## 2012-03-08 DIAGNOSIS — Z01818 Encounter for other preprocedural examination: Secondary | ICD-10-CM | POA: Insufficient documentation

## 2012-03-08 DIAGNOSIS — G4733 Obstructive sleep apnea (adult) (pediatric): Secondary | ICD-10-CM | POA: Insufficient documentation

## 2012-03-08 DIAGNOSIS — N189 Chronic kidney disease, unspecified: Secondary | ICD-10-CM | POA: Insufficient documentation

## 2012-03-08 DIAGNOSIS — I10 Essential (primary) hypertension: Secondary | ICD-10-CM | POA: Insufficient documentation

## 2012-03-08 DIAGNOSIS — I2510 Atherosclerotic heart disease of native coronary artery without angina pectoris: Secondary | ICD-10-CM | POA: Insufficient documentation

## 2012-03-08 DIAGNOSIS — I252 Old myocardial infarction: Secondary | ICD-10-CM | POA: Insufficient documentation

## 2012-03-08 LAB — BASIC METABOLIC PANEL
Anion Gap: 11 (ref 3–11)
Calcium: 9.7 mg/dL (ref 8.9–10.2)
Carbon Dioxide, Total: 21 mEq/L — ABNORMAL LOW (ref 22–32)
Chloride: 106 mEq/L (ref 98–108)
Creatinine: 2.56 mg/dL — ABNORMAL HIGH (ref 0.51–1.18)
GFR, Calc, African American: 30 mL/min — ABNORMAL LOW (ref >59)
GFR, Calc, European American: 25 mL/min — ABNORMAL LOW (ref >59)
Glucose: 88 mg/dL (ref 62–125)
Potassium: 4.6 mEq/L (ref 3.7–5.2)
Sodium: 138 mEq/L (ref 136–145)
Urea Nitrogen: 40 mg/dL — ABNORMAL HIGH (ref 8–21)

## 2012-03-08 LAB — CBC, DIFF
% Basophils: 1 %
% Eosinophils: 6 %
% Lymphocytes: 24 %
% Monocytes: 15 %
% Neutrophils: 54 %
Absolute Eosinophil Count: 0.3 10*3/uL (ref 0.00–0.50)
Absolute Lymphocyte Count: 1.19 10*3/uL (ref 1.00–4.80)
Basophils: 0.05 10*3/uL (ref 0.00–0.20)
Hematocrit: 38 % (ref 38–50)
Hemoglobin: 13 g/dL (ref 13.0–18.0)
MCH: 31.4 pg (ref 27.3–33.6)
MCHC: 34.3 g/dL (ref 32.2–36.5)
MCV: 92 fL (ref 81–98)
Monocytes: 0.74 10*3/uL (ref 0.00–0.80)
Neutrophils: 2.66 10*3/uL (ref 1.80–7.00)
Platelet Count: 183 10*3/uL (ref 150–400)
RBC: 4.14 mil/uL — ABNORMAL LOW (ref 4.40–5.60)
RDW-CV: 14 % (ref 11.6–14.4)
WBC: 4.94 10*3/uL (ref 4.3–10.0)

## 2012-03-08 LAB — PROTHROMBIN & PTT
Partial Thromboplastin Time: 33 s (ref 22–35)
Prothrombin INR: 1.1 (ref 0.8–1.3)
Prothrombin Time Patient: 13.1 s (ref 10.7–15.6)

## 2012-03-08 LAB — PR MRI SPINAL CANAL LUMBAR W/O CONTRAST MATERIAL

## 2012-03-08 NOTE — Progress Notes (Signed)
HPI: Tracy Conway is a 77 year old male presenting for a preoperative visit. He is scheduled to undergo L4-5 laminectomy/laminotomies on 03/21/2012 to be performed by Madelin Headings, MD at the [facility] in treatment for lumbar stenosis.    Tracy Conway is a 77 year old male with COPD, hx prostate CA, s/p stents placement in 2012 whose chief concern is a 18 month history of right lower back pain and decline in walking tolerance. MRI shows Severe central canal narrowing at L4-L5 and after failed non-operative treatments and further discussion with Dr. Nedra Hai, patient decided to undergo surgical intervention.      Social hx:  Married, lives in a tri-level house with wife.  7 steps to main floor, 8 steps to bedroom.  One dog, one bird.    Past Medical History   Diagnosis Date   . Aortic aneurysm of unspecified site without mention of rupture      Aortic Aneurysm   . MALIG NEOPLASM DORSAL TONGUE(aka CANCER) 03/29/2002     Ssurgery 1994, T1N1 squamous cell ca L tongue, partial glossectomy; L supraorbital hyoid neck dissection    . Tobacco use disorder 12/24/2005     Quit 2000.   Marland Kitchen Unspecified hearing loss 12/24/2005     Bilat hearing aids.   . ECZEMA  12/24/2005     Lower extremities in particular.   Marland Kitchen ERECTILE DYSFUNCTION 12/24/2005   . Benign neoplasm of colon 12/24/2005     Tubular adenoma. Dx'ed flex sig 2000--> colonoscopy 2000: N.  F/U 2003: mild diverticulosis, tubular adenoma.  F/U due 2008 with CT colonography.   . Other specified disorders resulting from impaired renal function 07/22/2005     Creat ~ 1.2   . Calculus of kidney 12/24/2005     Lithotripsy 1988   . 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 Thoracic aneurysm without mention of rupture 12/24/2005     Ascending aortic aneurysm.  Followed by Dr. Brunilda Payor, Cardiac surgery.  Gets regular CT scans.  Goal systolic BP: 100-120   . Abdominal aneurysm without mention of rupture 12/24/2005     Borderline, seen on CT 8/06.   . Ventricular septal defect 12/24/2005     Small muscular  VSD--NEEDS SBE PROPHYLAXIS. (Clindamycin 600 mg prior to dental visits.)   . Hypertrophy of prostate without urinary obstruction and other lower urinary tract symptoms (LUTS) 12/24/2005   . Generalized osteoarthrosis, unspecified site 01/07/2007     Has seen orthopedics--cortisone shot to L knee helped.   . Diverticulitis of colon (without mention of hemorrhage) 01/07/2007     Hospitalized at [facility] May 2008   . Unspecified disorder of muscle, ligament, and fascia    . Sleep apnea      Past Surgical History   Procedure Laterality Date   . Ventral hernia repair       x3   . Removal of tongue carcinoma with lymphadenectomy     . Fusion l foot distal phalange     . Lithotrypsy of r renal calculi     . Retinaculum release b/l wrists     . Vasectomy w/tray       x2   . Vasectomy reversal     . Rpr ing hernia, init, reduce     . Revise knee joint replace,all parts       left knee   . Ligation of sperm duct     . Brachy therapy     . Lipotripsy surgery     .  Heart stent placement  09/2010, 02/2011     [facility]     Family History   Problem Relation Age of Onset   . Cancer Father      Prostate   . Arthritis Father    . Other Father      Chronic Kidney Disease   . Psych Mother      Alzheimer's Disease   . Cancer Brother      Social History     Occupational History   . STAFF      Purchasing     Social History Main Topics   . Smoking status: Former Smoker -- 1.00 packs/day for 40 years     Quit date: 05/11/1998   . Smokeless tobacco: Never Used    Comment: quit three years ago   . Alcohol Use: 1.0 oz/week     2 drink(s) per week      quit 17 years ago   . Drug Use: No   . Sexually Active: Yes -- Male partner(s)      no protection       Allergies: Penicillin g and Percocet  Latex allergy: No  Iodine allergy: No    He has a current medication list which includes the following prescription(s): aspirin, benazepril hcl, clopidogrel bisulfate, hydrocodone-acetaminophen, metoprolol succinate er, omega 3, pantoprazole sodium, salmeterol  xinafoate, simvastatin, tamsulosin hcl, and tiotropium bromide monohydrate.      Physical exam:    GENERAL: Tracy Conway is a 77 year old male in no apparent distress.   BP 90/49  Pulse 78  Wt 224 lb 3.2 oz (101.696 kg)  BMI 31.7 kg/m2  SpO2 90%  PSYCHIATRIC: The patient is alert and oriented. Affect: Calm.   HEAD AND NECK:  Dentition:normal dentition for age Jaw mobility: full ROM  Neck mobility: full ROM  PULMONARY: deferred  CARDIOVASCULAR: exam deferred  Posture: erect    Spine Exam:  LUMBAR SPINE    MUSCULOSKELETAL:  The patient has no pain with range of motion of the right and left hip , knee and ankle .   NEUROLOGICAL:            MOTOR STRENGTH RIGHT LEFT   Hip Flexion 5/5 5/5   Knee extension 5/5 5/5   Knee flexion  5/5 5/5   Dorsiflexion 5/5 5/5   EHL 5/5 5/5   Plantar flexion 5/5 5/5                Assessment:: Tracy Conway is a 77 year old male with the clinical presentation of lumbar neurogenic claudication. He is scheduled to undergo L4-5 laminectomy/laminotomies on 03/21/2012 to be performed by Madelin Headings, MD at the [facility] in treatment for lumbar stenosis.      RISKS: I have discussed the risks, benefits and potential complications of the proposed surgical procedure. Risks discussed include, but are not limited to: infection, bleeding, nerve injury, return to the OR, spinal fluid leak, paralysis, stroke, heart attack, blindness and death. Other risks include dural tear, blood clots, pulmonary emboli, foot drop, worsening of pain.    MEDICATIONS:     Pt was seen by our med consult colleague today and we will defer any medication recommendation to our med consult colleague.    NICOTINE:Tracy Conway is advised to refrain from using all nicotine products (including gums, and patches) prior to surgery and for a minimum of 12 months following surgery.  NA    NSAIDs:Tracy Conway is advised to refrain from taking any  nonsteroidal anti-inflammatory products OTC (such as Ibuprofen, Aleve), or prescription  for 3 months following surgery. NA    For post op activities, I recommended to the patient to do as much walking as he can, starting with 15 minutes TID and increase as tolerated.  I advised the patient to avoid sitting for more than 45 minutes at a time, for prolonged sitting right after spine surgery can worsen back pain. I advised pt  not to do any lifting > 10 lbs until the next follow up visit where we will discuss these restrictions again.  He is also advised to keep the incisions dry with intact dressing for 5 days post op and after that, he may shower and let soap and water run down the incisions if the incisions look fine.  If the dressing gets soiled, he understands he will need to change it.    Please note that I discussed specifically, and in details, of our clinic policy for post op narcotic pain medication practice- we will prescribe up to 6 weeks post op for narcotic pain medication and should the patient need narcotic pain medication after the 6 weeks mark, he is to return back to his PCP (or the medical provider that was prescribing for patient prior to the surgery) to obtain the narcotic pain medication prescription.  I also advised the patient to contact our clinic 3 days before he runs out of pain medication, so we can get the prescription to him in the mail in a timely manner.  Please note that I also advised the patient if she calls the clinic for a refill on a Friday, the call will be addressed the following Monday when I return back to the office.    Patient will be evaluated by our pre-anesthesia colleague later on today and we will plan to obtain CBC, Chem 7, PT, PTT, and T&S .    Estimated LOS: 1-2 nights.    Encouraged pt to call back with further questions.

## 2012-03-08 NOTE — Progress Notes (Signed)
Preoperative instruction given per [facility] protocol. Literature for Durable Power of atty, Healthcare Directive, Pt rights and Responsibilities, [place] About Your Surgery booklet, and post-op instruction given to pt.  Stressed NPO status preop, ruling out sips of water, ice, etc. Call if questions.

## 2012-03-08 NOTE — Progress Notes (Signed)
I have seen and evaluated the patient and agree with the assessment and plan outlined above.  I have discussed the risks, benefits and potential complications of surgical vs. non surgical treatment extensively with the patient.  Risks discussed include, but are not limited to: infection, bleeding, nerve injury, return to the OR, spinal fluid leak, paralysis, stroke, heart attack, blindness and death.    I have discussed the goals of surgical treatment with the patient.  The primary goal of surgical treatment is the relief of extremity pain and improvement of walking and standing tolerance.  Back pain resolution, while ideal is not the primary goal of the procedure and the patient understands that there may be residual back pain after the surgery.    We will proceed with L45 laminectomy/ laminotomies.  He would like to proceed with the surgery.

## 2012-03-08 NOTE — Telephone Encounter (Signed)
Pre anesthesia calling. Need electronic yellow packet in [facility], PSS-2  [facility]  [facility]

## 2012-03-09 ENCOUNTER — Telehealth (HOSPITAL_BASED_OUTPATIENT_CLINIC_OR_DEPARTMENT_OTHER): Payer: Self-pay | Admitting: Orthopaedic Surgery

## 2012-03-09 LAB — PSBC-PRENATAL PROFILE

## 2012-03-09 NOTE — Telephone Encounter (Signed)
ERIN PCC,    MR Kibby WOULD LIKE TO KNOW IF HIS SURGERY IS AUTHORIZED? PLEASE CALL AND GIVE STATUS.  Surgery is with Nedra Hai MD 03/21/12.    THANK YOU,    Orlin Hilding, PSS-2  [facility]  [facility]  .

## 2012-03-10 ENCOUNTER — Encounter (HOSPITAL_BASED_OUTPATIENT_CLINIC_OR_DEPARTMENT_OTHER): Payer: Self-pay | Admitting: Physical Medicine & Rehabilitation

## 2012-03-10 ENCOUNTER — Ambulatory Visit (HOSPITAL_BASED_OUTPATIENT_CLINIC_OR_DEPARTMENT_OTHER): Payer: PRIVATE HEALTH INSURANCE | Admitting: Physical Medicine & Rehabilitation

## 2012-03-10 ENCOUNTER — Ambulatory Visit
Payer: PRIVATE HEALTH INSURANCE | Attending: Physical Medicine & Rehabilitation | Admitting: Physical Medicine & Rehabilitation

## 2012-03-10 DIAGNOSIS — M545 Low back pain, unspecified: Secondary | ICD-10-CM | POA: Insufficient documentation

## 2012-03-10 DIAGNOSIS — M48061 Spinal stenosis, lumbar region without neurogenic claudication: Secondary | ICD-10-CM | POA: Insufficient documentation

## 2012-03-10 DIAGNOSIS — M47817 Spondylosis without myelopathy or radiculopathy, lumbosacral region: Secondary | ICD-10-CM | POA: Insufficient documentation

## 2012-03-10 LAB — PSBC PREADMIT TYPE AND SCREEN: Indir. Antiglobulin Rslt (Sendout): NEGATIVE

## 2012-03-10 NOTE — Patient Instructions (Signed)
Follow post-procedure instructions as outlined previsously.

## 2012-03-10 NOTE — Progress Notes (Signed)
[facility]        Specializing in sports-related injuries and non-surgical care for conditions of the spine,  shoulder, elbow, wrist, hand, hip, knee, foot, and ankle.    03/10/2012    Tracy Conway  [id number]    PROCEDURE:  Fluoroscopic guided caudal injection    DIAGNOSIS/INDICATION:  Advanced L4-5 central spinal stenosis. MRI dated 03/17/11 reviewed. See clinic notes for additional information from 02/05/12.  He has met with Dr Tracy Hai and is proceeding with surgery later this month but would like to still have this injection for pain management.  Allergies: is allergic to penicillin g and percocet.  Allergies were reviewed with the patient. No contraindications were identified.     CONSENT:  Potential risks including pain, serious infection, paralysis,nerve injury, spinal headache, allergic reaction, and others were discussed with the patient. The patient was given and read a patient information sheet regarding injection procedures prior to today's injection. The patient understands the benefits,risks, and alternatives of the procedure and agrees to the procedure today. Written informed consent was obtained.     TECHNIQUE:  Patient was escorted to the fluoroscopy room and positioned prone on the procedure table. A brief pause occurred prior to procedure in which final verification was performed. The region overlying the sacral hiatus was identified.The skin was prepped and draped in the usual sterile fashion using Betadine. The skin and soft tissues were anesthetized using 1% lidocaine. A 22 gauge, 3  inch spinal needle was then introduced into the sacral canal via the sacral hiatus using intermittent-fluoroscopic guidance. Precise needle placement was confirmed by injection of Omnipaque (iohexol) contrast dye using live fluoroscopy in both lateral and AP views.  A normal epidural pattern of dye was seen without evidence of vascular uptake.  A total of 10cc containing 2cc of 1%  lidocaine, 6cc of NaCl, and 2cc of Celestone (betamethasone 6mg /mL) was then injected into the epidural space. The needle was then flushed with 1% lidocaine and removed, the skin was cleansed, and gauze was placed over the injection site. The patient tolerated the procedure well.     The patient was escorted back to the post-procedure recovery room and remained stable, with good vital signs both pre- and post-procedure.     COMPLICATIONS:  None    POST-INJECTION PLAN:  1. The patient was given instructions regarding the use of ice and appropriate activity level over the next few weeks. Patient was instructed to call the office immediately if there are any questions or problems.    2. Since Mr Tracy Conway has surgery planned he will f/u with me if he has any concerns prior to his surgical date or if he decides to delay surgery.

## 2012-03-10 NOTE — Patient Instructions (Signed)
preop

## 2012-03-14 ENCOUNTER — Telehealth (HOSPITAL_BASED_OUTPATIENT_CLINIC_OR_DEPARTMENT_OTHER): Payer: Self-pay | Admitting: Physical Medicine & Rehabilitation

## 2012-03-14 NOTE — Telephone Encounter (Signed)
Patient received a Fluoroscopic guided caudal injection last Thursday.     Called the patient and he states "I have not noticed much relief from the injection at all for me. I had no complications from the injection. I will have surgery later this month with MD Nedra Hai".    Advised him I will give this information to MD Harrast he was agreeable with this.     Jamison Neighbor RN  [facility]

## 2012-03-15 ENCOUNTER — Encounter (HOSPITAL_BASED_OUTPATIENT_CLINIC_OR_DEPARTMENT_OTHER): Payer: PRIVATE HEALTH INSURANCE | Admitting: Cardiovascular Disease

## 2012-03-17 ENCOUNTER — Encounter (HOSPITAL_BASED_OUTPATIENT_CLINIC_OR_DEPARTMENT_OTHER): Payer: PRIVATE HEALTH INSURANCE | Admitting: Cardiovascular Disease

## 2012-03-21 ENCOUNTER — Encounter (HOSPITAL_BASED_OUTPATIENT_CLINIC_OR_DEPARTMENT_OTHER): Payer: Self-pay

## 2012-03-21 ENCOUNTER — Inpatient Hospital Stay (HOSPITAL_COMMUNITY): Payer: PRIVATE HEALTH INSURANCE | Admitting: Orthopaedic Surgery

## 2012-03-21 ENCOUNTER — Ambulatory Visit
Admission: RE | Admit: 2012-03-21 | Discharge: 2012-03-22 | DRG: 491 | Disposition: A | Discharge status: Home / Self Care | Payer: PRIVATE HEALTH INSURANCE | Attending: Orthopaedic Surgery | Admitting: Orthopaedic Surgery

## 2012-03-21 DIAGNOSIS — G4733 Obstructive sleep apnea (adult) (pediatric): Secondary | ICD-10-CM | POA: Diagnosis present

## 2012-03-21 DIAGNOSIS — IMO0002 Reserved for concepts with insufficient information to code with codable children: Secondary | ICD-10-CM | POA: Diagnosis present

## 2012-03-21 DIAGNOSIS — M7130 Other bursal cyst, unspecified site: Secondary | ICD-10-CM | POA: Diagnosis present

## 2012-03-21 DIAGNOSIS — Z880 Allergy status to penicillin: Secondary | ICD-10-CM

## 2012-03-21 DIAGNOSIS — I712 Thoracic aortic aneurysm, without rupture, unspecified: Secondary | ICD-10-CM | POA: Diagnosis present

## 2012-03-21 DIAGNOSIS — I6529 Occlusion and stenosis of unspecified carotid artery: Secondary | ICD-10-CM | POA: Diagnosis present

## 2012-03-21 DIAGNOSIS — M48062 Spinal stenosis, lumbar region with neurogenic claudication: Secondary | ICD-10-CM | POA: Diagnosis present

## 2012-03-21 DIAGNOSIS — I2510 Atherosclerotic heart disease of native coronary artery without angina pectoris: Secondary | ICD-10-CM | POA: Diagnosis present

## 2012-03-21 DIAGNOSIS — Z87891 Personal history of nicotine dependence: Secondary | ICD-10-CM

## 2012-03-21 DIAGNOSIS — N183 Chronic kidney disease, stage 3 unspecified: Secondary | ICD-10-CM | POA: Diagnosis present

## 2012-03-21 DIAGNOSIS — J4489 Other specified chronic obstructive pulmonary disease: Secondary | ICD-10-CM | POA: Diagnosis present

## 2012-03-21 DIAGNOSIS — K219 Gastro-esophageal reflux disease without esophagitis: Secondary | ICD-10-CM | POA: Diagnosis present

## 2012-03-21 DIAGNOSIS — N400 Enlarged prostate without lower urinary tract symptoms: Secondary | ICD-10-CM | POA: Diagnosis present

## 2012-03-21 DIAGNOSIS — I129 Hypertensive chronic kidney disease with stage 1 through stage 4 chronic kidney disease, or unspecified chronic kidney disease: Secondary | ICD-10-CM | POA: Diagnosis present

## 2012-03-21 DIAGNOSIS — E7800 Pure hypercholesterolemia, unspecified: Secondary | ICD-10-CM | POA: Diagnosis present

## 2012-03-21 DIAGNOSIS — I9589 Other hypotension: Secondary | ICD-10-CM | POA: Diagnosis not present

## 2012-03-21 DIAGNOSIS — I252 Old myocardial infarction: Secondary | ICD-10-CM

## 2012-03-21 DIAGNOSIS — Z9861 Coronary angioplasty status: Secondary | ICD-10-CM

## 2012-03-21 HISTORY — PX: SURGICAL HX OTHER: 99

## 2012-03-22 ENCOUNTER — Ambulatory Visit: Payer: PRIVATE HEALTH INSURANCE | Attending: Orthopaedic Surgery

## 2012-03-22 DIAGNOSIS — IMO0001 Reserved for inherently not codable concepts without codable children: Secondary | ICD-10-CM | POA: Insufficient documentation

## 2012-03-28 ENCOUNTER — Other Ambulatory Visit (HOSPITAL_BASED_OUTPATIENT_CLINIC_OR_DEPARTMENT_OTHER): Payer: Self-pay | Admitting: Orthopaedic Surgery

## 2012-03-28 NOTE — Telephone Encounter (Signed)
CONFIRMED PHONE NUMBER:  [phone]  CALLERS FIRST AND LAST NAME: Lisbeth Ply  FACILITY NAME: na TITLE: na  CALLERS RELATIONSHIP:Self  RETURN CALL: Detailed message on voicemail only    SUBJECT: General Message   REASON FOR REQUEST: Pt request Hydromorphone 2 mg refill,please mail Rx to him. Thank you    MESSAGE: Dr. Nedra Hai

## 2012-03-29 ENCOUNTER — Other Ambulatory Visit (HOSPITAL_BASED_OUTPATIENT_CLINIC_OR_DEPARTMENT_OTHER): Payer: Self-pay | Admitting: Cardiovascular Disease

## 2012-03-29 ENCOUNTER — Ambulatory Visit: Payer: PRIVATE HEALTH INSURANCE | Attending: Cardiovascular Disease | Admitting: Cardiovascular Disease

## 2012-03-29 DIAGNOSIS — E785 Hyperlipidemia, unspecified: Secondary | ICD-10-CM | POA: Insufficient documentation

## 2012-03-29 DIAGNOSIS — M48062 Spinal stenosis, lumbar region with neurogenic claudication: Secondary | ICD-10-CM | POA: Insufficient documentation

## 2012-03-29 DIAGNOSIS — I719 Aortic aneurysm of unspecified site, without rupture: Secondary | ICD-10-CM | POA: Insufficient documentation

## 2012-03-29 DIAGNOSIS — I2589 Other forms of chronic ischemic heart disease: Secondary | ICD-10-CM | POA: Insufficient documentation

## 2012-03-29 DIAGNOSIS — R031 Nonspecific low blood-pressure reading: Secondary | ICD-10-CM | POA: Insufficient documentation

## 2012-03-29 LAB — BASIC METABOLIC PANEL
Anion Gap: 12 — ABNORMAL HIGH (ref 3–11)
Calcium: 9.6 mg/dL (ref 8.9–10.2)
Carbon Dioxide, Total: 22 mEq/L (ref 22–32)
Chloride: 99 mEq/L (ref 98–108)
Creatinine: 2.61 mg/dL — ABNORMAL HIGH (ref 0.51–1.18)
GFR, Calc, African American: 29 mL/min — ABNORMAL LOW (ref >59)
GFR, Calc, European American: 24 mL/min — ABNORMAL LOW (ref >59)
Glucose: 100 mg/dL (ref 62–125)
Potassium: 5.1 mEq/L (ref 3.7–5.2)
Sodium: 133 mEq/L — ABNORMAL LOW (ref 136–145)
Urea Nitrogen: 39 mg/dL — ABNORMAL HIGH (ref 8–21)

## 2012-03-29 NOTE — Telephone Encounter (Signed)
ID/CC: 77 yo male s/p L4-5 bilateral laminotomies and partial medial facet resection and right L4-5 synovial cyst resection on 11/25 with Dr Nedra Hai, calling for refill of hydromorphone.    S/O: 1 week post op. Last rx for hydromorphone 2mg  1-2 tabs po Q4h prn #50. Check of [place] PMP is as expected.     A/P: Left voicemail for patient to call clinic for pain assessment/refill. Next follow up 12/17.     James Ivanoff, PharmD  [facility]  [facility]

## 2012-03-29 NOTE — Telephone Encounter (Signed)
Pt called to request a refill. Please see TE below.   Please call patient back.    Lonell Grandchild, PSS  [facility]

## 2012-03-30 LAB — HEMOGLOBIN A1C, HPLC: Hemoglobin A1C: 5.9 % (ref 4.0–6.0)

## 2012-03-30 MED ORDER — HYDROMORPHONE HCL 2 MG OR TABS
ORAL_TABLET | ORAL | Status: DC
Start: 2012-03-30 — End: 2012-06-07

## 2012-03-30 NOTE — Telephone Encounter (Signed)
Nursing note:  A/P:   Refill authorized by pharmacist,  HYDROmorphone HCl 2 MG Oral Tab 50  03/30/2012   Sig: Comment: 1-2 tabs po Q6h prn post surgical pain.  Pt contacted and advised this would be mailed to his home today.  Patient/family education: Patient states understanding of plan and is satisfied.  Patient is encouraged to call with any further questions or concerns.  Closing encounter.    DD (Diane) Laray Anger, RN  [facility]  [facility]

## 2012-03-30 NOTE — Telephone Encounter (Signed)
Nursing note:  A/P:  Rec'd incoming call transfer from pt calling back to f/u on med request.  He states he is "doing pretty well", and rates his pain at worst 5-6/10; ptt notes pain is worst in the afternoon-evening.  He is tolerating meds w/o difficulty.  Pt states he is taking 2tabs hydromorphone 2mg  approx 2-3X daily.  Pt states he has adequate supply of pain meds for 2-3days and is amenable to mailing rx refill.  Address verified.  Reports he is taking miralax BID with plenty of H2O and having regular BMs.  He is eating and drinking adequately.  Pt states incision is benign; wife is changing dressing.  He is up and about, ambulating, "but not too far".    Pt advised I will consult with pharmacist re refill and call him back when this is completed.  He is advised to continue to gradually increase activity, and monitor incision.  Confirmed f/u appt 04/12/12.  Patient/family education: Patient states understanding of plan and is satisfied.  Patient is encouraged to call with any further questions or concerns.    DD (Diane) Laray Anger, RN  [facility]  [facility]

## 2012-04-12 ENCOUNTER — Encounter (HOSPITAL_BASED_OUTPATIENT_CLINIC_OR_DEPARTMENT_OTHER): Payer: Self-pay | Admitting: Orthopaedic Surgery

## 2012-04-12 ENCOUNTER — Ambulatory Visit: Payer: PRIVATE HEALTH INSURANCE | Attending: Orthopaedic Surgery | Admitting: Orthopaedic Surgery

## 2012-04-12 VITALS — BP 98/55 | HR 76 | Temp 96.5°F | Ht 70.0 in | Wt 215.8 lb

## 2012-04-12 NOTE — Progress Notes (Signed)
Tracy Conway is now 3 weeks s/p his lumbar decompression surgery at L45.  He is quite pleased with the results of the surgery reporting 0/10 leg and 0/10 back pain.    Incision is well healed.      I have encouraged him to continue ambulation within the specified restrictions.  I would like for him to followup in 3 weeks for repeat clinical and radiographic evaluation.  He is quite pleased with the results of the surgery.

## 2012-05-02 ENCOUNTER — Telehealth (INDEPENDENT_AMBULATORY_CARE_PROVIDER_SITE_OTHER): Payer: Self-pay | Admitting: Family Medicine

## 2012-05-02 NOTE — Telephone Encounter (Signed)
Spoke with spouse Nicholos Johns, who reports her husband recently started drinking again after 25 yrs of sobriety. Purpose of her call was that she thought Dr[facility] Burgess Estelle [facility] and [facility] on her husband's status if he was inpatient.     She moved out Fri night because she has to take care of herself, she has no support network. He didn't know she was leaving the home, and has been alone since.     This morning he was taken to [facility] for detox by a friend from [employer]. They did not admit him. They don't have an inpatient program any more. He is home now and seeking help at another facility. He is still not in treatment. He sounds better today but is still home alone. They ran blood and urine tests but determined he did not need acute detox.     She has been seeing a counselor for several months and talked to her counselor this morning. Her husband also sees a Veterinary surgeon at the same clinic. She needs help sleeping and uses Ambien. I scheduled an appt with Dr. Burgess Estelle for 05/05/12 for Nicholos Johns. FYI to PCP.

## 2012-05-03 NOTE — Telephone Encounter (Signed)
Thanks Tracy Conway--I'll be in the office Wednesday and can follow up then. ct

## 2012-05-05 NOTE — Telephone Encounter (Signed)
Saw Tracy Conway's wife Tracy Conway today--Bills is apparently going to check into a 28 day rehab program at [facility] today or tomorrow.

## 2012-05-10 ENCOUNTER — Encounter (HOSPITAL_BASED_OUTPATIENT_CLINIC_OR_DEPARTMENT_OTHER): Payer: PRIVATE HEALTH INSURANCE | Admitting: Orthopaedic Surgery

## 2012-06-07 ENCOUNTER — Ambulatory Visit (INDEPENDENT_AMBULATORY_CARE_PROVIDER_SITE_OTHER): Payer: PRIVATE HEALTH INSURANCE | Admitting: Family Medicine

## 2012-06-07 ENCOUNTER — Encounter (INDEPENDENT_AMBULATORY_CARE_PROVIDER_SITE_OTHER): Payer: Self-pay | Admitting: Family Medicine

## 2012-06-07 VITALS — BP 121/72 | HR 65 | Temp 97.5°F | Resp 16 | Ht 69.25 in | Wt 217.0 lb

## 2012-06-07 LAB — COMPREHENSIVE METABOLIC PANEL
ALT (GPT): 22 U/L (ref 10–48)
AST (GOT): 34 U/L (ref 15–40)
Albumin: 4 g/dL (ref 3.5–5.2)
Alkaline Phosphatase (Total): 49 U/L — ABNORMAL LOW (ref 52–227)
Anion Gap: 8 (ref 3–11)
Bilirubin (Total): 0.6 mg/dL (ref 0.2–1.3)
Calcium: 9.6 mg/dL (ref 8.9–10.2)
Carbon Dioxide, Total: 26 mEq/L (ref 22–32)
Chloride: 104 mEq/L (ref 98–108)
Creatinine: 1.97 mg/dL — ABNORMAL HIGH (ref 0.51–1.18)
GFR, Calc, African American: 40 mL/min — ABNORMAL LOW (ref >59)
GFR, Calc, European American: 33 mL/min — ABNORMAL LOW (ref >59)
Glucose: 125 mg/dL (ref 62–125)
Potassium: 5.5 mEq/L — ABNORMAL HIGH (ref 3.7–5.2)
Protein (Total): 6.7 g/dL (ref 6.0–8.2)
Sodium: 138 mEq/L (ref 136–145)
Urea Nitrogen: 41 mg/dL — ABNORMAL HIGH (ref 8–21)

## 2012-06-07 LAB — LIPID PANEL
Cholesterol (LDL): 66 mg/dL (ref <130)
Cholesterol/HDL Ratio: 3.3
HDL Cholesterol: 43 mg/dL (ref >40)
Non-HDL Cholesterol: 99 mg/dL (ref 0–159)
Total Cholesterol: 142 mg/dL (ref <200)
Triglyceride: 163 mg/dL — ABNORMAL HIGH (ref <150)

## 2012-06-07 MED ORDER — TIOTROPIUM BROMIDE MONOHYDRATE 18 MCG IN CAPS
ORAL_CAPSULE | RESPIRATORY_TRACT | Status: DC
Start: 2012-06-07 — End: 2013-01-04

## 2012-06-07 MED ORDER — HYDROMORPHONE HCL 2 MG OR TABS
ORAL_TABLET | ORAL | Status: DC
Start: 2012-06-07 — End: 2012-06-14

## 2012-06-07 MED ORDER — SALMETEROL XINAFOATE 50 MCG/ACT IN AEPB
INHALATION_SPRAY | RESPIRATORY_TRACT | Status: DC
Start: 2012-06-07 — End: 2012-12-19

## 2012-06-07 MED ORDER — TRIAMCINOLONE ACETONIDE 0.1 % EX OINT
TOPICAL_OINTMENT | CUTANEOUS | Status: DC
Start: 2012-06-07 — End: 2013-03-30

## 2012-06-07 NOTE — Progress Notes (Signed)
S:    Pt here for Yearly exam.  He recently was admitted to inpatient treatment for alcohol--this was his first relapse in 26 years. He is abstinent for almost a month now and is happy about this.  His wife has moved back home though they are not sleeping together--they are working on their relationship    Had spine surgery in November 2013 for back pain--doing well with this.    Was seen recently in clinic for Rash on ankles--saw dr Zenaida Niece epps--diagnosed with fungus--no results from otc antifungal, now rash is creeping up his legs  Had bronchitis while in treatment--got a steroid and an antibiotic-- azithromycin--interestingly rash got better during that time but did not disappear from  ankles though diminished  Very itchy.  He does have a past hx of eczema that has affected his lower limbs mostly.    ROS:  ROS:  Constitutional: no weight loss, weight gain  Eyes: no complaints  ENT/mouth: neg  Cardiovascular: no chest pain, sob.  Respiratory: no asthma, pneumonia, bronchitis, TB.   Gastrointestinal: no PUD, GB, liver disease, change in bowels. Though does have some C which he ascribes to the pain meds he has taken for his back  GU/GYNE: no urinary complaints.   Musculoskeletal: neg  Skin/breast: neg  Neuro: no seizures, migraines, loc.  Psych: no depression,anxiety, substance abuse.   Endo: no thyroid problems, no DM.   Hematologic/Lymphatic: no blood disorders  Allergic: see allergy profile.     Patient Active Problem List    Diagnosis Date Noted   . CORONARY ARTERY DISEASE [410.90] 12/30/2010     Non-ST elevation myocardial infarction., July 18,2012  LAD drug-eluting stent nov, 2012. Clopidogrel  See Dr. Angelena Sole, cardiology       . Malignant neoplasm of prostate [185] 08/14/2010     Diagnosed 07/2010. Sees Dr. Rennis Harding (urol) and Dr. Perlie Gold (rad onc). Saw Birdie Hopes, [name]--[facility] [place]--palladium seed implants June 2012  Mr. Emmerich has an intermediate risk prostate cancer on the basis of a  Gleason 7 histology       . Lumbago [724.2] 12/19/2009     March 21, 2012, underwent L4-L5 bilateral laminectomies and partial medial facetectomies, as well as an L4-L5 right-sided synovial cyst resection. He says since surgery he feels better       . Routine general medical examination at a health care facility [V70.0] 05/15/2009     06/07/2012  Td/Tdap (q 10 yr):  2004, 2012  Pneumococcal (65 yoa and high risk x1):  2010, 2004  Influenza vaccine (q 1 yr):  2010 nov, 2011, 2012, 2013  Shingles: discussed--should get at his local pharmacy  Blood pressure:  Excellent 138/64  121/72  Cholesterol (q 5 yr begin age 9):on simvastatin oct 2012: excellent--will repeat  AAA screen (x1, male age 59, if ever smoked):  Checked at [facility]-- abdo and thoracic CT--aug 2012: stable ascending aortic aneurysm  Colorectal cancer: ct colonoscopy done 2008, repeat due 2013--done April 2013: neg   PSA:  Sees urology for fu prostate ca. Has had palladium seed implants  Tobacco:  NS x 11 yrs, 50 pack yr hx  Exercise:  Reg--treadmill x 20-30 mins then weight machines--every other day! Has not been back to the gym because of the back pain  Alcohol /Drugs:  None in 26 yrs--relapse jan 2014--did inpatient treatment--now none in 37 days!  Sexual practice/contraception:  Not an issue  Advanced directives:  Does have living will. DPOA  done       .  GENERAL OSTEOARTHROSIS [715.00] 01/07/2007     Has seen orthopedics--cortisone shot to L knee helped.  L knee replacement 2008     . DIVERTICULITIS OF COLON W/O BLEED [562.11] 01/07/2007     Hospitalized at [facility] May 2008     . TOBACCO USE DISORDER [305.1] 12/24/2005     Quit 2000.  50 pack year history  EtOH--quit 1986.     Marland Kitchen HEARING LOSS NOS [389.9] 12/24/2005     Bilat hearing aids.     . ECZEMA  [692.9] 12/24/2005     Lower extremities in particular.     Marland Kitchen ERECTILE DYSFUNCTION [607.84] 12/24/2005   . BENIGN NEOPLASM LG BOWEL [211.3] 12/24/2005     Tubular adenoma. Dx'ed flex sig 2000-->  colonoscopy 2000: N.  F/U 2003: mild diverticulosis, tubular adenoma.  F/U due 2008 with CT colonography.--done 7/08: ess. N.  Next due 2013       . CALCULUS OF KIDNEY [592.0] 12/24/2005     Lithotripsy 1988  Stone panel in 0ct 09: high oxalate. Given low oxalae diet by Dr. Arbie Cookey, nephrology in 2009.       Marland Kitchen COPD [496] 12/24/2005     Emphysema: an FEV1 which is approximately at 33% of predicted    04/2010--Pulm: Chronic obstructive pulmonary disease, severe to very severe. I explained to him that continuing on the Spiriva as well as the Serevent was likely good therapy.I further told him that if he should have recurrent exacerbations within the course of the year that I would consider adding an inhaled corticosteroid at that point in time. He does not need oxygen at this point in time. He remains a nonsmoker. He is already engaged in a pulmonary rehabilitation.    Today I described to him how overall lung volume reduction surgery had no significant benefit on mortality. Subgroup analysis did indicate that those patients, who had upper lobe predominant disease with low exercise capacity were the most likely to benefit. Given the fact that he actually still has reasonably good functional status and is getting around and going to the gym and working out, it is not clear that he would be in the group that would most likely benefit, although, to safe for certain, we need to get a cardiopulmonary exercise test. At this point in time, he is feeling good with his chronic obstructive pulmonary disease therapy and is not interested in pursuing lung volume reduction surgery.    05/20/2010 CT angio: Addendum: Increased scarring in the right lung base since the prior   exam including the interval development of a new nodule. The   appearance suggests that the patient has had a recent infection of   the right lower lobe has resolved and since the prior scan. However   recommend 3 months followup of the right lower lobe lesion  given that   the patient is high risk with extensive paraseptal emphysema.     08/03/11 CT Chest  1. New indeterminate nodules in the lower lobes bilaterally, the largest   nodule measures 10 x 12 mm in the paraspinal location in the right lower lobe.  Possible considerations include infectious/inflammatory and less likely   metastasis.   2. Bilateral upper lobe predominant moderate centrilobular and paraseptal   emphysema.   3. Unchanged dilated main pulmonary artery measuring 32 mm may indicate   pulmonary arterial hypertension.   4. Aortic aneurysm involving the ascending aorta at the level of the main   pulmonary artery measuring 47 mm.                  Marland Kitchen  Thoracic aneurysm without mention of rupture [441.2] 12/24/2005     Ascending aortic aneurysm.  Followed by Dr. Brunilda Payor, Cardiac surgery.  Gets regular CT scans.  Goal systolic BP: 100-120  Also seeing Dr. Angelena Sole cardiology  Also has AAA     . Abdominal aneurysm without mention of rupture [441.4] 12/24/2005     Father also had AAA. Borderline, seen on CT 8/06.  Also has thoracic aneurysm     . VENTRICULAR SEPT DEFECT [745.4] 12/24/2005     Small muscular VSD--NEEDS SBE PROPHYLAXIS. (Clindamycin 600 mg prior to dental visits.)     . HYPERTROPHY PROSTATE W/O OBST [600.00] 12/24/2005   . Chronic Kidney Disease -- Stage III [588.89] 07/22/2005     Creat ~ 1.8. Normal albuminuria (8/08)  Likely 2/2 HTN, poss component from hx obstructing nephrolithiasis.  8/08 Renal: referred to Uro for 8mm stone, nonobstructing. Also doing w/u for stone type, poss target prevention.     Marland Kitchen HYPERTENSION NOS(aka HTN) [401.9] 03/29/2002   . PURE HYPERCHOLESTEROLEM [272.0] 03/29/2002   . 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 Lumbar stenosis with neurogenic claudication [724.03] 02/05/2012     March 21, 2012, underwent L4-L5 bilateral laminectomies and  partial medial facetectomies, as well as an L4-L5 right-sided synovial cyst resection. He says since surgery he feels better         Outpatient Prescriptions Prior to Visit   Medication Sig Dispense Refill   . Aspirin 325 MG Oral Tab Take 1 tablet by mouth daily  90 Tab  3   . Aspirin 81 MG Oral Tab Once daily       . Benazepril HCl 10 MG Oral Tab Once daily       . Benazepril HCl 20 MG Oral Tab Take 1 tablet by mouth daily  90 Tab  0   . Clopidogrel Bisulfate 75 MG Oral Tab     0   . HYDROmorphone HCl 2 MG Oral Tab 1-2 tabs po Q6h prn post surgical pain  50 Tab  0   . Metoprolol Succinate 100 MG Oral TABLET SR 24 HR half tablet daily    0   . Multiple Vitamins-Minerals (MULTIVITAMIN OR) Once daily       . NIFEdipine ER (AFEDITAB CR) 60 MG Oral TABLET SR 24 HR Once daily       . Omega 3 1200 MG Oral Cap     0   . Pantoprazole Sodium 40 MG Oral Pack 1 tab PO BID.       . Salmeterol Xinafoate (SEREVENT DISKUS) 50 MCG/DOSE Inhalation AEROSOL POWDER, BREATH ACTIVATED 1 inh bid       . Simvastatin 40 MG Oral Tab Take 1 tablet by mouth every evening for cholesterol  90 Tab  4   . Tamsulosin HCl 0.4 MG Oral Cap TAKE 1 CAPSULE BY MOUTH AT BEDTIME  90 Cap  3   . Tiotropium Bromide Monohydrate (SPIRIVA HANDIHALER) 18 MCG Inhalation Cap None Entered         No facility-administered medications prior to visit.     O:  Pt alert and cooperative and in NAD.  BP 121/72  Pulse 65  Temp(Src) 97.5 F (36.4 C) (Temporal)  Resp 16  Ht 5' 9.25" (1.759 m)  Wt 217 lb (98.431 kg)  BMI 31.81 kg/m2  SpO2 95%  H+N:  PERL, EOMs full. Oroph: unremarkable, TMs clear bilat, no thyromeg, no increased cervical LNs. No supra clavicular LNs.  Chest: clear to bases bilat though breath sounds quite diminished bilat.  CV: Normal S1 and S2, no murmurs heard though HS faint. No carotid bruits  Abdo: BS present throughout, soft, NT, ND, no organomegaly or masses  Extrem: no cyanosis or edema.  No obvious joint or limb abnormalities  Neuro: grossly  normal to observation, gait normal  skinL eczematous eruption on lower legs    A/P:  (V70.0) Routine general medical examination at a health care facility  (primary encounter diagnosis)  Plan: discussed diet, exercise which the patient will start to get back into, congratulated pt on successfully completing etoh program      (692.9) Eczema lower limbs  Plan: Triamcinolone Acetonide 0.1 % External Ointment           (338.18) Other acute postoperative pain  Plan: HYDROmorphone HCl 2 MG Oral Tab        Discussed with pt that this is not a good med long term. Discussed tolerance, addiction and significant side effects like C and somnolence. He will use this just prior to working out--just to allow him to be more active for now after his back surgery     (496) COPD  Plan: Tiotropium Bromide Monohydrate (SPIRIVA         HANDIHALER) 18 MCG Inhalation Cap, Salmeterol         Xinafoate (SEREVENT DISKUS) 50 MCG/DOSE         Inhalation AEROSOL POWDER, BREATH ACTIVATED        meds refilled    (586) Renal failure  Plan: COMPREHENSIVE METABOLIC PANEL, BASIC METABOLIC         PANEL            (272.4) Hyperlipemia  Plan: LIPID PANEL            rtc prn.    Tana Coast, MD

## 2012-06-07 NOTE — Patient Instructions (Signed)
Consider getting a shingles vaccine at your local pharmacy

## 2012-06-09 ENCOUNTER — Ambulatory Visit: Payer: PRIVATE HEALTH INSURANCE | Attending: Cardiovascular Disease | Admitting: Cardiovascular Disease

## 2012-06-09 DIAGNOSIS — I2510 Atherosclerotic heart disease of native coronary artery without angina pectoris: Secondary | ICD-10-CM | POA: Insufficient documentation

## 2012-06-09 DIAGNOSIS — E785 Hyperlipidemia, unspecified: Secondary | ICD-10-CM | POA: Insufficient documentation

## 2012-06-09 DIAGNOSIS — N19 Unspecified kidney failure: Secondary | ICD-10-CM | POA: Insufficient documentation

## 2012-06-09 DIAGNOSIS — R031 Nonspecific low blood-pressure reading: Secondary | ICD-10-CM | POA: Insufficient documentation

## 2012-06-09 DIAGNOSIS — J4489 Other specified chronic obstructive pulmonary disease: Secondary | ICD-10-CM | POA: Insufficient documentation

## 2012-06-14 ENCOUNTER — Encounter (HOSPITAL_BASED_OUTPATIENT_CLINIC_OR_DEPARTMENT_OTHER): Payer: Self-pay | Admitting: Orthopaedic Surgery

## 2012-06-14 ENCOUNTER — Ambulatory Visit: Payer: PRIVATE HEALTH INSURANCE | Attending: Orthopaedic Surgery | Admitting: Orthopaedic Surgery

## 2012-06-14 ENCOUNTER — Encounter (INDEPENDENT_AMBULATORY_CARE_PROVIDER_SITE_OTHER): Payer: Self-pay | Admitting: Family Medicine

## 2012-06-14 DIAGNOSIS — G8918 Other acute postprocedural pain: Secondary | ICD-10-CM | POA: Insufficient documentation

## 2012-06-14 LAB — PR RADEX SPINE LUMBOSACRAL 2/3 VIEWS

## 2012-06-14 MED ORDER — HYDROMORPHONE HCL 2 MG OR TABS
ORAL_TABLET | ORAL | Status: DC
Start: 2012-06-14 — End: 2012-08-29

## 2012-06-14 NOTE — Progress Notes (Signed)
Tracy Conway is now roughly 2 months s/p his lumbar decompression.  He reports 0/10  Leg pain/  6/10 back pain.  He is pleased with the results of the surgery.  ODI 36  For his back pain, I have prescribed PT.  I have given him a final prescription of hydromorphone with limited number for his back pain to use as he needs it.  He may followup on an as needed basis.

## 2012-06-29 ENCOUNTER — Encounter (INDEPENDENT_AMBULATORY_CARE_PROVIDER_SITE_OTHER): Payer: Self-pay | Admitting: Family Medicine

## 2012-06-29 NOTE — Telephone Encounter (Signed)
Referral pended.  PCP to complete and sign if approved.  Thanks.

## 2012-06-30 NOTE — Telephone Encounter (Signed)
Referral completed.  Thanks [name] for your help.

## 2012-06-30 NOTE — Telephone Encounter (Signed)
Patient instructions send through [facility].

## 2012-07-13 ENCOUNTER — Ambulatory Visit (HOSPITAL_BASED_OUTPATIENT_CLINIC_OR_DEPARTMENT_OTHER): Payer: PRIVATE HEALTH INSURANCE

## 2012-07-14 ENCOUNTER — Ambulatory Visit: Payer: PRIVATE HEALTH INSURANCE | Attending: Family Medicine

## 2012-07-14 ENCOUNTER — Other Ambulatory Visit (INDEPENDENT_AMBULATORY_CARE_PROVIDER_SITE_OTHER): Payer: Self-pay | Admitting: Family Medicine

## 2012-07-14 DIAGNOSIS — K449 Diaphragmatic hernia without obstruction or gangrene: Secondary | ICD-10-CM | POA: Insufficient documentation

## 2012-07-15 LAB — CT COLONOGRAPHY; DIAGNOSTIC

## 2012-08-09 ENCOUNTER — Other Ambulatory Visit (INDEPENDENT_AMBULATORY_CARE_PROVIDER_SITE_OTHER): Payer: Self-pay | Admitting: Family Medicine

## 2012-08-10 ENCOUNTER — Ambulatory Visit: Payer: PRIVATE HEALTH INSURANCE | Attending: Critical Care Medicine | Admitting: Internal Medicine

## 2012-08-10 DIAGNOSIS — R0989 Other specified symptoms and signs involving the circulatory and respiratory systems: Secondary | ICD-10-CM | POA: Insufficient documentation

## 2012-08-10 DIAGNOSIS — J4489 Other specified chronic obstructive pulmonary disease: Secondary | ICD-10-CM | POA: Insufficient documentation

## 2012-08-10 MED ORDER — SIMVASTATIN 40 MG OR TABS
ORAL_TABLET | ORAL | Status: DC
Start: 2012-08-09 — End: 2013-09-19

## 2012-08-29 ENCOUNTER — Ambulatory Visit (INDEPENDENT_AMBULATORY_CARE_PROVIDER_SITE_OTHER): Payer: PRIVATE HEALTH INSURANCE | Admitting: Family Medicine

## 2012-08-29 ENCOUNTER — Encounter (INDEPENDENT_AMBULATORY_CARE_PROVIDER_SITE_OTHER): Payer: Self-pay | Admitting: Family Medicine

## 2012-08-29 VITALS — BP 103/56 | HR 70 | Temp 97.2°F | Wt 219.0 lb

## 2012-08-29 MED ORDER — GABAPENTIN 100 MG OR CAPS
ORAL_CAPSULE | ORAL | Status: DC
Start: 2012-08-29 — End: 2013-04-24

## 2012-08-29 MED ORDER — HYDROMORPHONE HCL 4 MG OR TABS
ORAL_TABLET | ORAL | Status: DC
Start: 2012-08-29 — End: 2012-11-09

## 2012-08-29 NOTE — Progress Notes (Signed)
S:  Here for recheck  Here for pain medications--  Takes Hydromorphone only a couple time per week  Uses it when he is On treadmill-- back starts to hurt  Would like 4 mg tabs so he can take just one  Takes once or twice a week max    No help from tylenol--does nothing  Does not want to take nsaids because of his renal failure    Needs to go to the gym to keep up lung function as well as for the benefits to other systems    Re back pain:  Pain is at the belt line  Starts as dull ache, then after 5 minutes on the treadmill--seems like burning sensation  Usually sitting and lying are ok, standing and walking are pretty uncomfortable  Does have bulging L4 L5 discs  Had surgery for spinal stenosis which helped back symptoms a lot though not completely    Dried apricots help for C    Has had lots of PT for various conditions including his back in the past  Has gone to swimming pool with float and ankle weights though availablility of pool is not great--that seems to help too has ankle weights  Patient Active Problem List    Diagnosis Date Noted   . CORONARY ARTERY DISEASE [410.90] 12/30/2010     Non-ST elevation myocardial infarction., July 18,2012  LAD drug-eluting stent nov, 2012. Clopidogrel  See Dr. Angelena Sole, cardiology       . Malignant neoplasm of prostate [185] 08/14/2010     Diagnosed 07/2010. Sees Dr. Rennis Harding (urol) and Dr. Perlie Gold (rad onc). Saw Birdie Hopes, [name]--[facility] [place]--palladium seed implants June 2012  Mr. Mullenbach has an intermediate risk prostate cancer on the basis of a Gleason 7 histology       . Lumbago [724.2] 12/19/2009     March 21, 2012, underwent L4-L5 bilateral laminectomies and partial medial facetectomies, as well as an L4-L5 right-sided synovial cyst resection. He says since surgery he feels better       . Routine general medical examination at a health care facility [V70.0] 05/15/2009     06/07/2012  Td/Tdap (q 10 yr):  2004, 2012  Pneumococcal (65 yoa and high risk  x1):  2010, 2004  Influenza vaccine (q 1 yr):  2010 nov, 2011, 2012, 2013  Shingles: discussed--should get at his local pharmacy  Blood pressure:  Excellent 138/64  121/72  Cholesterol (q 5 yr begin age 58):on simvastatin oct 2012: excellent--will repeat  AAA screen (x1, male age 49, if ever smoked):  Checked at [facility]-- abdo and thoracic CT--aug 2012: stable ascending aortic aneurysm  Colorectal cancer: ct colonoscopy done 2008, repeat due 2013--done April 2013: neg   PSA:  Sees urology for fu prostate ca. Has had palladium seed implants  Tobacco:  NS x 11 yrs, 50 pack yr hx  Exercise:  Reg--treadmill x 20-30 mins then weight machines--every other day! Has not been back to the gym because of the back pain  Alcohol /Drugs:  None in 26 yrs--relapse jan 2014--did inpatient treatment--now none in 37 days!  Sexual practice/contraception:  Not an issue  Advanced directives:  Does have living will. DPOA  done       . GENERAL OSTEOARTHROSIS [715.00] 01/07/2007     Has seen orthopedics--cortisone shot to L knee helped.  L knee replacement 2008     . DIVERTICULITIS OF COLON W/O BLEED [562.11] 01/07/2007     Hospitalized at [facility] May 2008     .  TOBACCO USE DISORDER [305.1] 12/24/2005     Quit 2000.  50 pack year history  EtOH--quit 1986.     Marland Kitchen HEARING LOSS NOS [389.9] 12/24/2005     Bilat hearing aids.     . ECZEMA  [692.9] 12/24/2005     Lower extremities in particular.     Marland Kitchen ERECTILE DYSFUNCTION [607.84] 12/24/2005   . BENIGN NEOPLASM LG BOWEL [211.3] 12/24/2005     Tubular adenoma. Dx'ed flex sig 2000--> colonoscopy 2000: N.  F/U 2003: mild diverticulosis, tubular adenoma.  F/U due 2008 with CT colonography.--done 7/08: ess. N.  Next due 2013       . CALCULUS OF KIDNEY [592.0] 12/24/2005     Lithotripsy 1988  Stone panel in 0ct 09: high oxalate. Given low oxalae diet by Dr. Arbie Cookey, nephrology in 2009.       Marland Kitchen COPD [496] 12/24/2005     Emphysema: an FEV1 which is approximately at 33% of predicted    04/2010--Pulm: Chronic  obstructive pulmonary disease, severe to very severe. I explained to him that continuing on the Spiriva as well as the Serevent was likely good therapy.I further told him that if he should have recurrent exacerbations within the course of the year that I would consider adding an inhaled corticosteroid at that point in time. He does not need oxygen at this point in time. He remains a nonsmoker. He is already engaged in a pulmonary rehabilitation.    Today I described to him how overall lung volume reduction surgery had no significant benefit on mortality. Subgroup analysis did indicate that those patients, who had upper lobe predominant disease with low exercise capacity were the most likely to benefit. Given the fact that he actually still has reasonably good functional status and is getting around and going to the gym and working out, it is not clear that he would be in the group that would most likely benefit, although, to safe for certain, we need to get a cardiopulmonary exercise test. At this point in time, he is feeling good with his chronic obstructive pulmonary disease therapy and is not interested in pursuing lung volume reduction surgery.    05/20/2010 CT angio: Addendum: Increased scarring in the right lung base since the prior   exam including the interval development of a new nodule. The   appearance suggests that the patient has had a recent infection of   the right lower lobe has resolved and since the prior scan. However   recommend 3 months followup of the right lower lobe lesion given that   the patient is high risk with extensive paraseptal emphysema.     08/03/11 CT Chest  1. New indeterminate nodules in the lower lobes bilaterally, the largest   nodule measures 10 x 12 mm in the paraspinal location in the right lower lobe.  Possible considerations include infectious/inflammatory and less likely   metastasis.   2. Bilateral upper lobe predominant moderate centrilobular and paraseptal   emphysema.    3. Unchanged dilated main pulmonary artery measuring 32 mm may indicate   pulmonary arterial hypertension.   4. Aortic aneurysm involving the ascending aorta at the level of the main   pulmonary artery measuring 47 mm.                  . Thoracic aneurysm without mention of rupture [441.2] 12/24/2005     Ascending aortic aneurysm.  Followed by Dr. Brunilda Payor, Cardiac surgery.  Gets regular CT scans.  Goal systolic  BP: 100-120  Also seeing Dr. Angelena Sole cardiology  Also has AAA     . Abdominal aneurysm without mention of rupture [441.4] 12/24/2005     Father also had AAA. Borderline, seen on CT 8/06.  Also has thoracic aneurysm     . VENTRICULAR SEPT DEFECT [745.4] 12/24/2005     Small muscular VSD--NEEDS SBE PROPHYLAXIS. (Clindamycin 600 mg prior to dental visits.)     . HYPERTROPHY PROSTATE W/O OBST [600.00] 12/24/2005   . Chronic Kidney Disease -- Stage III [588.89] 07/22/2005     Creat ~ 1.8. Normal albuminuria (8/08)  Likely 2/2 HTN, poss component from hx obstructing nephrolithiasis.  8/08 Renal: referred to Uro for 8mm stone, nonobstructing. Also doing w/u for stone type, poss target prevention.     Marland Kitchen HYPERTENSION NOS(aka HTN) [401.9] 03/29/2002   . PURE HYPERCHOLESTEROLEM [272.0] 03/29/2002   . 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 Lumbar stenosis with neurogenic claudication [724.03] 02/05/2012     March 21, 2012, underwent L4-L5 bilateral laminectomies and partial medial facetectomies, as well as an L4-L5 right-sided synovial cyst resection. He says since surgery he feels better           Current Outpatient Prescriptions   Medication Sig   . Aspirin 325 MG Oral Tab Take 1 tablet by mouth daily   . Aspirin 81 MG Oral Tab Once daily   . Benazepril HCl 10 MG Oral Tab Once daily   . Gabapentin 100 MG Oral Cap 1 tab let po qhs   . HYDROmorphone HCl 4 MG Oral Tab 1 tablet(s) by  mouth every 12 hours if needed to relieve pain   . Metoprolol Succinate 100 MG Oral TABLET SR 24 HR half tablet daily   . Multiple Vitamins-Minerals (MULTIVITAMIN OR) Once daily   . NIFEdipine ER 60 MG Oral TABLET SR 24 HR takes once daily as needed for BP > 135   . Pantoprazole Sodium 40 MG Oral Pack 1 tab PO BID.   . Salmeterol Xinafoate (SEREVENT DISKUS) 50 MCG/DOSE Inhalation AEROSOL POWDER, BREATH ACTIVATED 1 inh bid   . Simvastatin 40 MG Oral Tab TAKE 1 TABLET DAILY IN THE EVENING FOR CHOLESTEROL   . Tamsulosin HCl 0.4 MG Oral Cap TAKE 1 CAPSULE BY MOUTH AT BEDTIME   . Tiotropium Bromide Monohydrate (SPIRIVA HANDIHALER) 18 MCG Inhalation Cap 1 inhalation once daily   . Triamcinolone Acetonide 0.1 % External Ointment apply to affected areas bid     No current facility-administered medications for this visit.         O:  Pt alert and cooperative and in NAD.  BP 103/56  Pulse 70  Temp(Src) 97.2 F (36.2 C) (Temporal)  Wt 219 lb (99.338 kg)  BMI 31.42 kg/m2  Full exam not done today.  Good transfers in clinic--no problems.    A/P:  Discussed the use of narcotics for chronic pain and the problems of the development of tolerance and addiction as well as constipation and other side effects.  If he continues to use the hydromorphone very judiciously, then, that may be sustainable.  May consider trying a different, less potent narcotic, though is allergic to percocet.  Also discussed adjuvant meds--will try small dose of gabapentin qhs to see if this might help with what sounds like neuropathic "burning" belt line pain.  This med needs to  be renally dosed, though will start at very low dose.  Continuing the exercise is very beneficial for this patient.  Reminded him that he needs to b e seen in clinic for any future refills.  Continue to try exercising at pool  Pt voiced understanding.    Tana Coast, MD

## 2012-08-29 NOTE — Patient Instructions (Signed)
Moiturizers:  eucerin  aquaphor  ceraVe  lubriderm    The thicker the better!

## 2012-09-08 ENCOUNTER — Encounter (HOSPITAL_BASED_OUTPATIENT_CLINIC_OR_DEPARTMENT_OTHER): Payer: PRIVATE HEALTH INSURANCE | Admitting: Cardiovascular Disease

## 2012-09-12 ENCOUNTER — Other Ambulatory Visit (HOSPITAL_BASED_OUTPATIENT_CLINIC_OR_DEPARTMENT_OTHER): Payer: Self-pay | Admitting: Cardiovascular Disease

## 2012-09-12 ENCOUNTER — Ambulatory Visit: Payer: PRIVATE HEALTH INSURANCE | Attending: Cardiovascular Disease | Admitting: Cardiovascular Disease

## 2012-09-12 DIAGNOSIS — I2589 Other forms of chronic ischemic heart disease: Secondary | ICD-10-CM | POA: Insufficient documentation

## 2012-09-12 DIAGNOSIS — I712 Thoracic aortic aneurysm, without rupture, unspecified: Secondary | ICD-10-CM | POA: Insufficient documentation

## 2012-09-12 DIAGNOSIS — J4489 Other specified chronic obstructive pulmonary disease: Secondary | ICD-10-CM | POA: Insufficient documentation

## 2012-09-12 DIAGNOSIS — E785 Hyperlipidemia, unspecified: Secondary | ICD-10-CM | POA: Insufficient documentation

## 2012-09-12 DIAGNOSIS — I10 Essential (primary) hypertension: Secondary | ICD-10-CM | POA: Insufficient documentation

## 2012-09-12 LAB — BASIC METABOLIC PANEL
Anion Gap: 8 (ref 3–11)
Calcium: 8.9 mg/dL (ref 8.9–10.2)
Carbon Dioxide, Total: 28 mEq/L (ref 22–32)
Chloride: 103 mEq/L (ref 98–108)
Creatinine: 2.44 mg/dL — ABNORMAL HIGH (ref 0.51–1.18)
GFR, Calc, African American: 32 mL/min — ABNORMAL LOW (ref >59)
GFR, Calc, European American: 26 mL/min — ABNORMAL LOW (ref >59)
Glucose: 107 mg/dL (ref 62–125)
Potassium: 4.1 mEq/L (ref 3.7–5.2)
Sodium: 139 mEq/L (ref 136–145)
Urea Nitrogen: 36 mg/dL — ABNORMAL HIGH (ref 8–21)

## 2012-10-08 ENCOUNTER — Other Ambulatory Visit (HOSPITAL_BASED_OUTPATIENT_CLINIC_OR_DEPARTMENT_OTHER): Payer: Self-pay | Admitting: Cardiovascular Disease

## 2012-10-08 MED ORDER — BENAZEPRIL HCL 20 MG OR TABS
ORAL_TABLET | ORAL | Status: DC
Start: 2012-10-08 — End: 2013-02-16

## 2012-10-08 MED ORDER — METOPROLOL SUCCINATE ER 100 MG OR TB24
EXTENDED_RELEASE_TABLET | ORAL | Status: DC
Start: 2012-10-08 — End: 2013-02-16

## 2012-10-08 NOTE — Telephone Encounter (Signed)
Refill Requested via [employer], Lf 4/14  La:  9/14  Na:  5/14  RTC    Med list in chart note by  provider:  Benazepril 10 mg a day.   2.  Metoprolol succinate XL 50 mg a day.   3.  Simvastatin 40 mg a day.   4.  Tamsulosin 0.4 mg a day.   5.  Advair inhaler twice daily.   6.  Tiotropium inhaler once a day.   7.  Enteric-coated aspirin 81 mg a day.   8.  Pantoprazole 40 mg twice a day.   9.  Multivitamins.   10.  Nifedipine 30 mg a day.   BMP 5/14-K+ back to wnl    Comment/Action Rf x 110mo

## 2012-10-24 ENCOUNTER — Other Ambulatory Visit (INDEPENDENT_AMBULATORY_CARE_PROVIDER_SITE_OTHER): Payer: Self-pay | Admitting: Family Medicine

## 2012-10-26 MED ORDER — TAMSULOSIN HCL 0.4 MG OR CAPS
ORAL_CAPSULE | ORAL | Status: DC
Start: 2012-10-24 — End: 2013-02-24

## 2012-11-08 ENCOUNTER — Encounter (INDEPENDENT_AMBULATORY_CARE_PROVIDER_SITE_OTHER): Payer: Self-pay | Admitting: Family Medicine

## 2012-11-09 ENCOUNTER — Ambulatory Visit (INDEPENDENT_AMBULATORY_CARE_PROVIDER_SITE_OTHER): Payer: PRIVATE HEALTH INSURANCE | Admitting: Medical

## 2012-11-09 ENCOUNTER — Encounter (INDEPENDENT_AMBULATORY_CARE_PROVIDER_SITE_OTHER): Payer: Self-pay | Admitting: Medical

## 2012-11-09 ENCOUNTER — Other Ambulatory Visit (INDEPENDENT_AMBULATORY_CARE_PROVIDER_SITE_OTHER): Payer: Self-pay | Admitting: Family Medicine

## 2012-11-09 VITALS — BP 121/71 | HR 68 | Temp 98.2°F | Resp 14 | Wt 210.0 lb

## 2012-11-09 MED ORDER — HYDROMORPHONE HCL 4 MG OR TABS
ORAL_TABLET | ORAL | Status: DC
Start: 2012-11-09 — End: 2012-12-19

## 2012-11-09 MED ORDER — CITALOPRAM HYDROBROMIDE 20 MG OR TABS
ORAL_TABLET | ORAL | Status: DC
Start: 2012-11-09 — End: 2012-12-19

## 2012-11-09 NOTE — Patient Instructions (Signed)
You may receive a survey in the mail.  Thank you in advance for completing it, this helps me to make sure I am providing the best care possible for you.  - Bryna    SELF CARE PLAN FOR ANXIETY AND DEPRESSION    1)  MEDICATION AND COUNSELING (take medication at the same time every day, you may need a pill box or other reminder)  Your plan is to :    2)  EXERCISE Research shows that exercise is a powerful tool for increasing energy and decreasing stress and fatigue; at least 60 minutes a day is optimal but don't fall into "all or nothing" thinking--- a 10 minute stroll around your neighborhood can make a big difference in your mood.  Your plan is to:    3)  MAKE TIME FOR PLEASURABLE ACTIVITIES (listen to music, pick up an old or new hobby or sport, take a trip to go hiking or to a museum, care for a pet, read a good book, do something spontaneous)  Your plan is to:    4)  SPEND TIME WITH PEOPLE WHO CAN SUPPORT YOU (talk with friends and family, get together with a group once per week, join a sports team or a volunteer organization)  Your plan is to:    5) PRACTICE RELAXATION (progressive muscle relaxation, yoga, taking a hot bath, meditation) Your plan is to:    6)  IDENTIFY AND RESOLVE STRESSORS (Overloaded at work?  Unsupportive relationships? Taking on too much?  Make plans to relieve some stressful situations, but realize you can't solve all the big problems at once.  Watch out for perfectionism and procrastination) Your plan is to:    7)  SET GOALS AND TAKE SMALL STEPS (What life goals do you have for the next year?  The next 10 years?  Set reasonable goals, break them into small steps, and acknowledge your accomplishments) Your plan is to:    8)  EAT BALANCED AND NUTRITIOUS MEALS (Cut back on junk food, eat plenty of fruits and vegetables, avoid alcohol and caffeine, try to eat your meals with friends and family) Your plan is to:

## 2012-11-09 NOTE — Progress Notes (Signed)
S:  Tracy Conway is a 78 year old male who presents today for concerns about depression.    Feeling down since retirement a few years ago.    Wife is still working, feels bad that he can't do hiking with his COPD and the other outdoor activities that she enjoys.    Would also like to do gardening and mowing the lawn but COPD limits this as well.    Walks on the treadmill every day.    EtOH dependence history, had a relapse recently but sober since January.    Going to [employer], has good support through his friends there.    Has noticed that he used to enjoy listening to music but hasn't enjoyed this recently.    Interested in starting a medication today.   PHQ-9: 13/27 today    PATIENT HEALTH QUESTIONNAIRE (PHQ-9)  Over the last 2 weeks, how often have you been bothered by any of the following problems?    1. Little interest or pleasure in doing things: more than half the days (2)  2. Feeling down, depressed, or hopeless: more than half the days (2)  3. Trouble falling or staying asleep,or sleeping too much: several days  (1)  4. Feeling tired or having little energy: more than half the days (2)  5. Poor appetite or overeating: several days  (1)  6. Feeling bad about yourself-or that you are a failure or have let yourself or your family down: more than half the days (2)  7. Trouble concentrating on things, such as reading the newspaper or watching television: more than half the days (2)  8. Moving or speaking so slowly that other people could have noticed. Or the opposite-being so fidgety or restless that you have been moving around a lot  more than usual: several days  (1)  9. Thoughts that you would be better off dead, or of hurting yourself in some way: not at all (0)    Total Score Depression Severity  1-4 Minimal depression  5-9 Mild depression  10-14 Moderate depression  15-19 Moderately severe depression  20-27 Severe depression    10. If you checked off any problems, how difficult have these  problems made it for  you to do your work, take care of things at home, or get along with other people? Very difficult  ---------------------------------------------------------------------------------------------------------  PHQ-9 is adapted from [employer], developed by Drs Molly Maduro L. Spitzer, Nance Pear, Tora Perches, and colleagues, with an educational grant from [employer]. For research information, contact Dr Everlene Other at [email or web address]. Use of the PHQ-9 may only be made in accordance with the Terms of Use available at ThisOrder.com.ee. Copyright [employer]. All rights reserved. [employer] is a [employer], multimedia.       GAD-7 (Generalized Anxiety Disorder screening tool)  How often during the past 2 weeks have you felt bothered by:  0 = not at all  1 = several days  2 = more than half the days  3 = nearly everyday   1. Feeling nervous, anxious, or on edge  1   2. Not being able to stop or control worrying  1   3. Worrying too much about different things  0   4. Trouble relaxing  0   5. Being so restless that it is hard to sit still  0   6. Becoming easily annoyed or irritable  1   7. Feeling afraid as if something  awful might happen  1                                          TOTAL:   3   If you checked off any problems, how difficult have these problems made it for you to do your work, take care of things at home, or get along with other people? not difficult at all    Scoring: Add the results for question number one through seven to get a total score.  If you score 10 or above you might want to consider one or more of the following: discuss your symptoms with your doctor, contact a local mental health care provider or contact my office for further assessment and possible treatment. Although these questions serve as a useful guide, only an appropriate licensed health professional can make the diagnosis of Generalized Anxiety Disorder.    ROS:  Constitutional: Denies recent  illnesses,  fever, chills, fatigue, malaise  Musculoskeletal: Admits to continued back pain.        Patient Active Problem List   Diagnosis   . HYPERTENSION NOS(aka HTN)   . PURE HYPERCHOLESTEROLEM   . MALIG NEOPLASM DORSAL TONGUE(aka CANCER)   . Chronic Kidney Disease -- Stage III   . TOBACCO USE DISORDER   . HEARING LOSS NOS   . ECZEMA    . ERECTILE DYSFUNCTION   . BENIGN NEOPLASM LG BOWEL   . CALCULUS OF KIDNEY   . COPD   . Thoracic aneurysm without mention of rupture   . Abdominal aneurysm without mention of rupture   . VENTRICULAR SEPT DEFECT   . 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 GENERAL OSTEOARTHROSIS   . DIVERTICULITIS OF COLON W/O BLEED   . Routine general medical examination at a health care facility   . Lumbago   . Malignant neoplasm of prostate   . CORONARY ARTERY DISEASE   . Lumbar stenosis with neurogenic claudication       Outpatient Prescriptions Prior to Visit   Medication Sig Dispense Refill   . Aspirin 325 MG Oral Tab Take 1 tablet by mouth daily  90 Tab  3   . Aspirin 81 MG Oral Tab Once daily       . Benazepril HCl 10 MG Oral Tab Once daily       . Benazepril HCl 20 MG Oral Tab TAKE 1 TABLET BY MOUTH EACH DAY.  90 Tab  1   . Gabapentin 100 MG Oral Cap 1 tab let po qhs  30 Cap  3   . HYDROmorphone HCl 4 MG Oral Tab 1 tablet(s) by mouth every 12 hours if needed to relieve pain  40 Tab  0   . Metoprolol Succinate ER 100 MG Oral TABLET SR 24 HR TAKE 1 TABLET EVERY DAY  90 Tab  1   . Multiple Vitamins-Minerals (MULTIVITAMIN OR) Once daily       . NIFEdipine ER 60 MG Oral TABLET SR 24 HR takes once daily as needed for BP > 135       . Pantoprazole Sodium 40 MG Oral Pack 1 tab PO BID.       . Salmeterol Xinafoate (SEREVENT DISKUS) 50 MCG/DOSE Inhalation AEROSOL POWDER, BREATH ACTIVATED 1 inh bid  3 Inhaler  4   . Simvastatin 40 MG Oral Tab TAKE 1 TABLET DAILY IN THE EVENING FOR CHOLESTEROL  90 Tab  4   . Tamsulosin HCl 0.4 MG Oral Cap TAKE 1 CAPSULE BY MOUTH AT BEDTIME  90 Cap  1   . Tiotropium Bromide  Monohydrate (SPIRIVA HANDIHALER) 18 MCG Inhalation Cap 1 inhalation once daily  90 Cap  3   . Triamcinolone Acetonide 0.1 % External Ointment apply to affected areas bid  80 g  1     No facility-administered medications prior to visit.       Review of patient's allergies indicates:  Allergies   Allergen Reactions   . Penicillin G      hives     . Percocet (Oxycodone-Acetaminophen)      indigestion       Family History   Problem Relation Age of Onset   . Cancer Father      Prostate   . Arthritis Father    . Other Father      Chronic Kidney Disease   . Psych Mother      Alzheimer's Disease   . Cancer Brother      No family status information on file.       OBJECTIVE:  BP 121/71  Pulse 68  Temp(Src) 98.2 F (36.8 C) (Temporal)  Resp 14  Wt 210 lb (95.255 kg)  BMI 30.13 kg/m2  SpO2 96%  GENERAL: Well developed, well nourished patient in no acute distress.  Alert and oriented x3.      MENTAL STATUS EXAM:    Appearance: well-dressed and groomed  Speech: grossly normal, responds to my cues and hesitant  Motor: normal  Mood/Affect: flat affect/ depressed mood.  Cognition: seems grossly of at least average intelligence and statements and questions logical, appropriate to context, and goal directed  Memory: immediate, recent, and long-term memory is intact  Psychotic Symptoms: none  Interpersonal: appropriately sociable and reciprocal  Humor: nice sense of humor; gets jokes  Insight: excellent  Judgement: excellent   SI:  denies      ASSESSMENT/PLAN:    (311) Depression  (primary encounter diagnosis)  Sober from EtOH since January.  Depression sxs present since retirement a few years ago.  Discussed self-care at length, see patient instructions.  He is also interested in counseling, referral to [facility] program.  F/u in 4 weeks or sooner if symptoms are worsening.    Plan: Citalopram Hydrobromide 20 MG Oral Tab,         REFERRAL TO SOCIAL WORK    Dx, Tx, risks and alternatives discussed with patient and questions answered.  Return to clinic if symptoms increase, persist, or worsen.    Face to face consultation regarding HPI, exam, review of differential diagnoses, rationale for decision-making and use of medications including answering all questions and concerns, lasting 30 min with >= 50% of the time spent counseling on above issues.

## 2012-11-17 ENCOUNTER — Telehealth (INDEPENDENT_AMBULATORY_CARE_PROVIDER_SITE_OTHER): Payer: Self-pay

## 2012-11-17 NOTE — Telephone Encounter (Signed)
CONFIRMED PHONE NUMBER: [phone]  CALLERS FIRST AND LAST NAME: Lisbeth Ply  FACILITY NAME: na TITLE: na  CALLERS RELATIONSHIP:Self  RETURN CALL: General message OK     SUBJECT: Appointment Request   REASON FOR REQUEST: Per Referral    REQUEST APPOINTMENT WITH: MSW Elray Mcgregor  REFERRING PROVIDER: MD Karl Ito  REQUESTED DATE: ASAP  REQUESTED TIME: Any  UNABLE TO APPOINT: Other: Per SOP - Initial Appointment, Send TE.    Please call Patient back and assist with scheduling an appointment.  Thank you.

## 2012-11-17 NOTE — Telephone Encounter (Signed)
Spoke with Patient,     See SW Referral,     Closing TE

## 2012-12-19 ENCOUNTER — Encounter (INDEPENDENT_AMBULATORY_CARE_PROVIDER_SITE_OTHER): Payer: Self-pay | Admitting: Family Medicine

## 2012-12-19 ENCOUNTER — Ambulatory Visit (INDEPENDENT_AMBULATORY_CARE_PROVIDER_SITE_OTHER): Payer: PRIVATE HEALTH INSURANCE | Admitting: Family Medicine

## 2012-12-19 VITALS — BP 120/63 | HR 79 | Temp 97.6°F | Wt 204.0 lb

## 2012-12-19 MED ORDER — HYDROMORPHONE HCL 4 MG OR TABS
ORAL_TABLET | ORAL | Status: DC
Start: 2012-12-19 — End: 2013-03-30

## 2012-12-19 MED ORDER — CITALOPRAM HYDROBROMIDE 20 MG OR TABS
20.0000 mg | ORAL_TABLET | Freq: Two times a day (BID) | ORAL | Status: DC | PRN
Start: 2012-12-19 — End: 2012-12-27

## 2012-12-19 NOTE — Progress Notes (Signed)
S:  Pt is here for follow up several issues.    Seems to be doing well re depression  Doing more around the house, has been on citalopram since July 16--tolerating the med well  Mood is improved, feels like he is more outgoing at meetings  Goes to [employer] daily or even twice daily. He is also very involved with therapy groups at [facility] which he finds very fulfilling    Trouble getting to sleep that he thinks might be cause be several things:  One--might be related to starting the citalopram.  Also--because of finger discomfort:  Woke up one day with R little finger with numbness through to wrist  Thinks it might originate at the elbow  During day seem to be less prominent  Feels tingly and needly and burning  Fall about 3 weeks ago--in the driveway got some bruises all over--though numbness seemed to precede this injury  Hurt the R elbow many years ago when he fell in his 20's--residual inability to fully extend the elbow    Sometimes when gets up from sitting-- gets lightheaded  Had episode of significant dizziness at home one day  arm was shaking at the time  BP noted to be 97/47 at the time  He has an appt with Dr Angelena Sole his cardiologist next week    Has been successful in losing someweight--alters timing of meals and amount that he eats a bit    Also needs Pain med refill    Re Resp status: doing well with advair  Had episode of reflux that got into his lungs--caused him to  cough a lot for a relatively short while, then OK    Patient Active Problem List    Diagnosis Date Noted   . CORONARY ARTERY DISEASE [410.90] 12/30/2010     Non-ST elevation myocardial infarction., July 18,2012  LAD drug-eluting stent nov, 2012. Clopidogrel  See Dr. Angelena Sole, cardiology       . Malignant neoplasm of prostate [185] 08/14/2010     Diagnosed 07/2010. Sees Dr. Rennis Harding (urol) and Dr. Perlie Gold (rad onc). Saw Birdie Hopes, [name]--[facility] [place]--palladium seed implants June 2012  Mr. Milnes has an  intermediate risk prostate cancer on the basis of a Gleason 7 histology       . Lumbago [724.2] 12/19/2009     March 21, 2012, underwent L4-L5 bilateral laminectomies and partial medial facetectomies, as well as an L4-L5 right-sided synovial cyst resection. He says since surgery he feels better       . Routine general medical examination at a health care facility [V70.0] 05/15/2009     06/07/2012  Td/Tdap (q 10 yr):  2004, 2012  Pneumococcal (65 yoa and high risk x1):  2010, 2004  Influenza vaccine (q 1 yr):  2010 nov, 2011, 2012, 2013  Shingles: discussed--should get at his local pharmacy  Blood pressure:  Excellent 138/64  121/72  Cholesterol (q 5 yr begin age 66):on simvastatin oct 2012: excellent--will repeat  AAA screen (x1, male age 56, if ever smoked):  Checked at [place]-- abdo and thoracic CT--aug 2012: stable ascending aortic aneurysm  Colorectal cancer: ct colonoscopy done 2008, repeat due 2013--done April 2013: neg   PSA:  Sees urology for fu prostate ca. Has had palladium seed implants  Tobacco:  NS x 11 yrs, 50 pack yr hx  Exercise:  Reg--treadmill x 20-30 mins then weight machines--every other day! Has not been back to the gym because of the back pain  Alcohol /Drugs:  None in 26 yrs--relapse jan 2014--did inpatient treatment--now none in 37 days!  Sexual practice/contraception:  Not an issue  Advanced directives:  Does have living will. DPOA  done       . GENERAL OSTEOARTHROSIS [715.00] 01/07/2007     Has seen orthopedics--cortisone shot to L knee helped.  L knee replacement 2008     . DIVERTICULITIS OF COLON W/O BLEED [562.11] 01/07/2007     Hospital[facility] at [facility]     . TOBACCO USE DISORDER [305.1] 12/24/2005     Quit 2000.  50 pack year history  EtOH--quit 1986.     Marland Kitchen HEARING LOSS NOS [389.9] 12/24/2005     Bilat hearing aids.     . ECZEMA  [692.9] 12/24/2005     Lower extremities in particular.     Marland Kitchen ERECTILE DYSFUNCTION [607.84] 12/24/2005   . BENIGN NEOPLASM LG BOWEL [211.3] 12/24/2005      Tubular adenoma. Dx'ed flex sig 2000--> colonoscopy 2000: N.  F/U 2003: mild diverticulosis, tubular adenoma.  F/U due 2008 with CT colonography.--done 7/08: ess. N.  Next due 2013       . CALCULUS OF KIDNEY [592.0] 12/24/2005     Lithotripsy 1988  Stone panel in 0ct 09: high oxalate. Given low oxalae diet by Dr. Arbie Cookey, nephrology in 2009.       Marland Kitchen COPD [496] 12/24/2005     Emphysema: an FEV1 which is approximately at 33% of predicted    04/2010--Pulm: Chronic obstructive pulmonary disease, severe to very severe. I explained to him that continuing on the Spiriva as well as the Serevent was likely good therapy.I further told him that if he should have recurrent exacerbations within the course of the year that I would consider adding an inhaled corticosteroid at that point in time. He does not need oxygen at this point in time. He remains a nonsmoker. He is already engaged in a pulmonary rehabilitation.    Today I described to him how overall lung volume reduction surgery had no significant benefit on mortality. Subgroup analysis did indicate that those patients, who had upper lobe predominant disease with low exercise capacity were the most likely to benefit. Given the fact that he actually still has reasonably good functional status and is getting around and going to the gym and working out, it is not clear that he would be in the group that would most likely benefit, although, to safe for certain, we need to get a cardiopulmonary exercise test. At this point in time, he is feeling good with his chronic obstructive pulmonary disease therapy and is not interested in pursuing lung volume reduction surgery.    05/20/2010 CT angio: Addendum: Increased scarring in the right lung base since the prior   exam including the interval development of a new nodule. The   appearance suggests that the patient has had a recent infection of   the right lower lobe has resolved and since the prior scan. However   recommend 3 months  followup of the right lower lobe lesion given that   the patient is high risk with extensive paraseptal emphysema.     08/03/11 CT Chest  1. New indeterminate nodules in the lower lobes bilaterally, the largest   nodule measures 10 x 12 mm in the paraspinal location in the right lower lobe.  Possible considerations include infectious/inflammatory and less likely   metastasis.   2. Bilateral upper lobe predominant moderate centrilobular and paraseptal   emphysema.  3. Unchanged dilated main pulmonary artery measuring 32 mm may indicate   pulmonary arterial hypertension.   4. Aortic aneurysm involving the ascending aorta at the level of the main   pulmonary artery measuring 47 mm.                  . Thoracic aneurysm without mention of rupture [441.2] 12/24/2005     Ascending aortic aneurysm.  Followed by Dr. Brunilda Payor, Cardiac surgery.  Gets regular CT scans.  Goal systolic BP: 100-120  Also seeing Dr. Angelena Sole cardiology  Also has AAA     . Abdominal aneurysm without mention of rupture [441.4] 12/24/2005     Father also had AAA. Borderline, seen on CT 8/06.  Also has thoracic aneurysm     . VENTRICULAR SEPT DEFECT [745.4] 12/24/2005     Small muscular VSD--NEEDS SBE PROPHYLAXIS. (Clindamycin 600 mg prior to dental visits.)     . HYPERTROPHY PROSTATE W/O OBST [600.00] 12/24/2005   . Chronic Kidney Disease -- Stage III [588.89] 07/22/2005     Creat ~ 1.8. Normal albuminuria (8/08)  Likely 2/2 HTN, poss component from hx obstructing nephrolithiasis.  8/08 Renal: referred to Uro for 8mm stone, nonobstructing. Also doing w/u for stone type, poss target prevention.     Marland Kitchen HYPERTENSION NOS(aka HTN) [401.9] 03/29/2002   . PURE HYPERCHOLESTEROLEM [272.0] 03/29/2002   . 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 Depression [311] 11/10/2012   . Lumbar stenosis with neurogenic claudication [724.03]  02/05/2012     March 21, 2012, underwent L4-L5 bilateral laminectomies and partial medial facetectomies, as well as an L4-L5 right-sided synovial cyst resection. He says since surgery he feels better         Current Outpatient Prescriptions   Medication Sig   . Aspirin 81 MG Oral Tab Once daily   . Benazepril HCl 20 MG Oral Tab TAKE 1 TABLET BY MOUTH EACH DAY.   Marland Kitchen Citalopram Hydrobromide 20 MG Oral Tab Take 1 tablet (20 mg) by mouth 2 times a day as needed.   . Gabapentin 100 MG Oral Cap 1 tab let po qhs   . HYDROmorphone HCl 4 MG Oral Tab 1 tablet(s) by mouth every 12 hours if needed to relieve pain   . Metoprolol Succinate ER 100 MG Oral TABLET SR 24 HR TAKE 1 TABLET EVERY DAY   . Multiple Vitamins-Minerals (MULTIVITAMIN OR) Once daily   . NIFEdipine ER 30 MG Oral TABLET SR 24 HR Take 1 tablet (30 mg) by mouth.   . Pantoprazole Sodium 40 MG Oral Pack 1 tab PO BID.   Marland Kitchen Simvastatin 40 MG Oral Tab TAKE 1 TABLET DAILY IN THE EVENING FOR CHOLESTEROL   . Tamsulosin HCl 0.4 MG Oral Cap TAKE 1 CAPSULE BY MOUTH AT BEDTIME   . Tiotropium Bromide Monohydrate (SPIRIVA HANDIHALER) 18 MCG Inhalation Cap 1 inhalation once daily   . Triamcinolone Acetonide 0.1 % External Ointment apply to affected areas bid     No current facility-administered medications for this visit.       O:  Pt alert and cooperative and in NAD.  BP 120/63  Pulse 79  Temp(Src) 97.6 F (36.4 C) (Temporal)  Wt 204 lb (92.534 kg)  BMI 29.27 kg/m2  Chest: slightly decreased AE to bases bilat though no crackles, no wheezes heard.  CV: N HA ,  no M no ankle edema  Exam on  the R hand revealed good sensation good strength. I did not do Tinel's at the elbow/ulnar groove    A/P:  1) depression: doing well with citalopram--will continue with that med. Last ECG done sept 2013--no QT prolongation at that time, but we should consider repeating the EKG on his next visit    2) sleep difficulty: try taking citalopram at night instead of the morning to see if that  might help with sleep interference. Also try taking gabapentin at night    3) R small finger numbness--sounds like this might be mild distal ulnar neuropathy, possibly at the elbow, he has no neck pain so less likely to be brachial plexopathy. I am hopeful that this might be transient. Will observe for now-suggested that he might try using protective pad or sleeve at the elbow. If this gets worse, he might need investigations including EMG    4) lightheadedness--may be related to to his HTN meds. We reviewed these: benazepril 20 qd, nifedipine 30 qd (this is higher than he has been on in the past--used to take 60 just prn elevated bp). Metoprolol 100 bid. He is also on tamsulosin which could lower his bp. I'd prefer to have Dr. Angelena Sole weigh on on this regime--does it need to be lowered. Pt states that he will cut down on the nifedipine and check his BPs and see if that makes a difference    5) will refill pain meds as this allows him to be more active. Again, pt warned of deleterious effects of chronic narcotics and suggested that he use these as judiciously as possible    6) I am also concerned about his kidneys, GFR seems to continue to fall, and last creat done in may 2014 was 2.44--I did not address this with the patient on today's visit

## 2012-12-19 NOTE — Patient Instructions (Addendum)
Try taking the citalopram at night--see if that might help with sleep. Also gabapentin    Keep track of BPs especially if you have symptoms    Try the sleeve for the elbow--call me if the symptoms persist

## 2012-12-25 ENCOUNTER — Encounter (INDEPENDENT_AMBULATORY_CARE_PROVIDER_SITE_OTHER): Payer: Self-pay | Admitting: Family Medicine

## 2013-01-03 ENCOUNTER — Encounter (HOSPITAL_BASED_OUTPATIENT_CLINIC_OR_DEPARTMENT_OTHER): Payer: Self-pay | Admitting: Cardiovascular Disease

## 2013-01-03 ENCOUNTER — Ambulatory Visit (HOSPITAL_BASED_OUTPATIENT_CLINIC_OR_DEPARTMENT_OTHER): Payer: PRIVATE HEALTH INSURANCE

## 2013-01-03 ENCOUNTER — Ambulatory Visit: Payer: PRIVATE HEALTH INSURANCE | Attending: Cardiovascular Disease | Admitting: Cardiovascular Disease

## 2013-01-03 VITALS — HR 59 | Ht 71.0 in | Wt 201.0 lb

## 2013-01-03 DIAGNOSIS — E7800 Pure hypercholesterolemia, unspecified: Secondary | ICD-10-CM | POA: Insufficient documentation

## 2013-01-03 DIAGNOSIS — I219 Acute myocardial infarction, unspecified: Secondary | ICD-10-CM | POA: Insufficient documentation

## 2013-01-03 DIAGNOSIS — I714 Abdominal aortic aneurysm, without rupture, unspecified: Secondary | ICD-10-CM | POA: Insufficient documentation

## 2013-01-03 DIAGNOSIS — I252 Old myocardial infarction: Secondary | ICD-10-CM | POA: Insufficient documentation

## 2013-01-03 DIAGNOSIS — I712 Thoracic aortic aneurysm, without rupture, unspecified: Secondary | ICD-10-CM | POA: Insufficient documentation

## 2013-01-03 DIAGNOSIS — I10 Essential (primary) hypertension: Secondary | ICD-10-CM | POA: Insufficient documentation

## 2013-01-04 ENCOUNTER — Encounter (INDEPENDENT_AMBULATORY_CARE_PROVIDER_SITE_OTHER): Payer: Self-pay | Admitting: Family Medicine

## 2013-01-04 ENCOUNTER — Other Ambulatory Visit: Payer: Self-pay | Admitting: Pharmacist

## 2013-01-04 MED ORDER — TIOTROPIUM BROMIDE MONOHYDRATE 18 MCG IN CAPS
18.0000 ug | ORAL_CAPSULE | Freq: Every day | RESPIRATORY_TRACT | Status: DC
Start: 2013-01-04 — End: 2013-05-08

## 2013-01-04 MED ORDER — TIOTROPIUM BROMIDE MONOHYDRATE 18 MCG IN CAPS
18.0000 ug | ORAL_CAPSULE | Freq: Every day | RESPIRATORY_TRACT | Status: DC
Start: 2013-01-04 — End: 2013-01-04

## 2013-01-04 NOTE — Telephone Encounter (Signed)
Spiriva e-Rx'd to [facility].

## 2013-01-04 NOTE — Progress Notes (Signed)
KEVIN, SPACE          [id number]          01/03/2013      CARDIOLOGY CLINIC NOTE       DATE OF SERVICE     January 03, 2013    HISTORY OF PRESENT ILLNESS     Mr. Lapaglia has shortness of breath walking about 2 to 4 blocks.  Interestingly enough if he pushes a cart at [employer], he can walk quite a distance, he has no orthopnea or paroxysmal nocturnal dyspnea. No chest pain.  His appetite is down somewhat, but he has no nausea and vomiting, no abdominal pain.  He is constipated at times and he is somewhat concerned about his weight loss.  He is down 19 pounds since the middle of May.  He has difficulty sleeping.  He gets somewhat lightheaded at times, standing up from the sitting or supine position and he notices that often his blood pressure is 90/70 when he checks it in the morning.  This morning it was 105/54. When his blood pressure is low, he will not take his nifedipine. He still has back pain of 4 to 5 out of 10 intensity and every 2-3 days he takes an oxycodone.  He has nocturia x 2, and he complains of numbness in his fifth right upper extremity digit.     OBJECTIVE     VITAL SIGNS:  Blood pressure 106/68 sitting, 104/62 standing, pulse 64 per minute, weight as indicated is down 19 pounds since last time.  NECK:  No increased systemic venous pressure.  He has grade 1 systolic carotid bruit on the right, grade 2 on the left.  LUNGS:  Clear.  HEART:  Regular rhythm, grade 2 aortic systolic ejection murmur.  No gallops, heaves or percussive cardiomegaly.  ABDOMEN:  Obese, no masses.  EXTREMITIES:  No peripheral edema.  Good posterior tibial pulses.    ASSESSMENT AND PLAN     Although he does want to lose some weight, there is some concern about his rather rapid weight loss associated with some decreased appetite.  He has no other abdominal symptoms; however we have to keep an eye on it and if this problem persists, he may benefit by an abdominal ultrasound or CT scan.  He is off the alcohol.  He goes to AA  every day.  Some days twice.  I told him since his blood pressure is so low to stay off the nifedipine entirely and only take it as his blood pressure is about 130/80.  He did have an echocardiogram done today, his ejection fraction is 54%.  He does have some hypokinesia of the distal one-half of the lateral wall.  There is mild right ventricular enlargement.  His aortic valve sclerosis without stenosis and mild aortic regurgitation.  Ascending aorta is enlarged to a maximal diameter of 4.4 cm, which is unchanged since the study done in August 2012.  Overall from a cardiac standpoint, he is stable.  He is to continue on the medication as outlined.

## 2013-01-04 NOTE — Telephone Encounter (Signed)
Routing to Dr. Little Ishikawa since you are covering for Tanner would mind helping facilitate co-coordinating with Dr. Morrell Riddle or could you refill the Spiriva until Dr. Burgess Estelle comes back to start this.

## 2013-01-19 ENCOUNTER — Telehealth (HOSPITAL_BASED_OUTPATIENT_CLINIC_OR_DEPARTMENT_OTHER): Payer: Self-pay | Admitting: Cardiovascular Disease

## 2013-01-19 ENCOUNTER — Telehealth (INDEPENDENT_AMBULATORY_CARE_PROVIDER_SITE_OTHER): Payer: Self-pay | Admitting: Family Medicine

## 2013-01-19 ENCOUNTER — Other Ambulatory Visit (HOSPITAL_COMMUNITY): Payer: Self-pay | Admitting: Gastroenterology

## 2013-01-19 ENCOUNTER — Observation Stay
Admission: EM | Admit: 2013-01-19 | Discharge: 2013-01-20 | Disposition: A | Discharge status: Home / Self Care | Payer: PRIVATE HEALTH INSURANCE | Attending: Infectious Disease | Admitting: Infectious Disease

## 2013-01-19 ENCOUNTER — Other Ambulatory Visit (EMERGENCY_DEPARTMENT_HOSPITAL): Payer: Self-pay | Admitting: Anesthesiology

## 2013-01-19 ENCOUNTER — Observation Stay (HOSPITAL_BASED_OUTPATIENT_CLINIC_OR_DEPARTMENT_OTHER): Payer: PRIVATE HEALTH INSURANCE | Admitting: Infectious Disease

## 2013-01-19 ENCOUNTER — Other Ambulatory Visit (EMERGENCY_DEPARTMENT_HOSPITAL): Payer: Self-pay | Admitting: Emergency Medicine

## 2013-01-19 DIAGNOSIS — N189 Chronic kidney disease, unspecified: Secondary | ICD-10-CM | POA: Insufficient documentation

## 2013-01-19 DIAGNOSIS — N400 Enlarged prostate without lower urinary tract symptoms: Secondary | ICD-10-CM | POA: Insufficient documentation

## 2013-01-19 DIAGNOSIS — Z8546 Personal history of malignant neoplasm of prostate: Secondary | ICD-10-CM | POA: Insufficient documentation

## 2013-01-19 DIAGNOSIS — Z87891 Personal history of nicotine dependence: Secondary | ICD-10-CM | POA: Insufficient documentation

## 2013-01-19 DIAGNOSIS — M25569 Pain in unspecified knee: Secondary | ICD-10-CM | POA: Insufficient documentation

## 2013-01-19 DIAGNOSIS — W19XXXA Unspecified fall, initial encounter: Secondary | ICD-10-CM | POA: Insufficient documentation

## 2013-01-19 DIAGNOSIS — I712 Thoracic aortic aneurysm, without rupture, unspecified: Secondary | ICD-10-CM | POA: Insufficient documentation

## 2013-01-19 DIAGNOSIS — I252 Old myocardial infarction: Secondary | ICD-10-CM | POA: Insufficient documentation

## 2013-01-19 DIAGNOSIS — J4489 Other specified chronic obstructive pulmonary disease: Secondary | ICD-10-CM | POA: Insufficient documentation

## 2013-01-19 DIAGNOSIS — I2510 Atherosclerotic heart disease of native coronary artery without angina pectoris: Secondary | ICD-10-CM | POA: Insufficient documentation

## 2013-01-19 DIAGNOSIS — I714 Abdominal aortic aneurysm, without rupture, unspecified: Secondary | ICD-10-CM | POA: Insufficient documentation

## 2013-01-19 DIAGNOSIS — I129 Hypertensive chronic kidney disease with stage 1 through stage 4 chronic kidney disease, or unspecified chronic kidney disease: Secondary | ICD-10-CM | POA: Insufficient documentation

## 2013-01-19 DIAGNOSIS — M25519 Pain in unspecified shoulder: Secondary | ICD-10-CM | POA: Insufficient documentation

## 2013-01-19 DIAGNOSIS — G4733 Obstructive sleep apnea (adult) (pediatric): Secondary | ICD-10-CM | POA: Insufficient documentation

## 2013-01-19 DIAGNOSIS — Z8042 Family history of malignant neoplasm of prostate: Secondary | ICD-10-CM | POA: Insufficient documentation

## 2013-01-19 DIAGNOSIS — Z9861 Coronary angioplasty status: Secondary | ICD-10-CM | POA: Insufficient documentation

## 2013-01-19 DIAGNOSIS — S40019A Contusion of unspecified shoulder, initial encounter: Secondary | ICD-10-CM | POA: Insufficient documentation

## 2013-01-19 DIAGNOSIS — IMO0002 Reserved for concepts with insufficient information to code with codable children: Secondary | ICD-10-CM | POA: Insufficient documentation

## 2013-01-19 DIAGNOSIS — M549 Dorsalgia, unspecified: Secondary | ICD-10-CM | POA: Insufficient documentation

## 2013-01-19 DIAGNOSIS — R55 Syncope and collapse: Principal | ICD-10-CM | POA: Insufficient documentation

## 2013-01-19 DIAGNOSIS — K219 Gastro-esophageal reflux disease without esophagitis: Secondary | ICD-10-CM | POA: Insufficient documentation

## 2013-01-19 DIAGNOSIS — E785 Hyperlipidemia, unspecified: Secondary | ICD-10-CM | POA: Insufficient documentation

## 2013-01-19 LAB — CBC, DIFF
% Basophils: 0 %
% Eosinophils: 3 %
% Immature Granulocytes: 1 %
% Lymphocytes: 20 %
% Monocytes: 8 %
% Neutrophils: 68 %
Absolute Eosinophil Count: 0.14 10*3/uL (ref 0.00–0.50)
Absolute Lymphocyte Count: 1.06 10*3/uL (ref 1.00–4.80)
Basophils: 0.02 10*3/uL (ref 0.00–0.20)
Hematocrit: 38 % (ref 38–50)
Hemoglobin: 12.8 g/dL — ABNORMAL LOW (ref 13.0–18.0)
Immature Granulocytes: 0.04 10*3/uL (ref 0.00–0.05)
MCH: 29.7 pg (ref 27.3–33.6)
MCHC: 33.7 g/dL (ref 32.2–36.5)
MCV: 88 fL (ref 81–98)
Monocytes: 0.41 10*3/uL (ref 0.00–0.80)
Neutrophils: 3.68 10*3/uL (ref 1.80–7.00)
Platelet Count: 164 10*3/uL (ref 150–400)
RBC: 4.31 mil/uL — ABNORMAL LOW (ref 4.40–5.60)
RDW-CV: 15.9 % — ABNORMAL HIGH (ref 11.6–14.4)
WBC: 5.35 10*3/uL (ref 4.3–10.0)

## 2013-01-19 LAB — URINALYSIS COMPLETE, URN
Bacteria, URN: NONE SEEN
Bilirubin (Qual), URN: NEGATIVE
Epith Cells_Renal/Trans,URN: NEGATIVE /HPF
Epith Cells_Squamous, URN: NEGATIVE /LPF
Glucose Qual, URN: NEGATIVE mg/dL
Ketones, URN: NEGATIVE mg/dL
Leukocyte Esterase, URN: NEGATIVE
Nitrite, URN: NEGATIVE
Occult Blood, URN: NEGATIVE
Protein (Alb Semiquant), URN: NEGATIVE mg/dL
RBC, URN: NEGATIVE /HPF
Specific Gravity, URN: 1.011 g/mL (ref 1.002–1.027)
WBC, URN: NEGATIVE /HPF

## 2013-01-19 LAB — LAB ADD ON ORDER

## 2013-01-19 LAB — BASIC METABOLIC PANEL
Anion Gap: 9 (ref 3–11)
Calcium: 9.1 mg/dL (ref 8.9–10.2)
Carbon Dioxide, Total: 22 mEq/L (ref 22–32)
Chloride: 105 mEq/L (ref 98–108)
Creatinine: 1.68 mg/dL — ABNORMAL HIGH (ref 0.51–1.18)
GFR, Calc, African American: 48 mL/min — ABNORMAL LOW (ref >59)
GFR, Calc, European American: 40 mL/min — ABNORMAL LOW (ref >59)
Glucose: 129 mg/dL — ABNORMAL HIGH (ref 62–125)
Potassium: 4 mEq/L (ref 3.7–5.2)
Sodium: 136 mEq/L (ref 136–145)
Urea Nitrogen: 29 mg/dL — ABNORMAL HIGH (ref 8–21)

## 2013-01-19 LAB — 1ST EXTRA GRAY TOP

## 2013-01-19 LAB — TROPONIN_I
Troponin_I Interpretation: NORMAL
Troponin_I: <0.03 ng/mL (ref <0.04)

## 2013-01-19 LAB — PROTHROMBIN & PTT
Partial Thromboplastin Time: 45 s — ABNORMAL HIGH (ref 22–35)
Prothrombin INR: 1 (ref 0.8–1.3)
Prothrombin Time Patient: 12.6 s (ref 10.7–15.6)

## 2013-01-19 NOTE — Telephone Encounter (Signed)
CONFIRMED PHONE NUMBER: [phone]  CALLERS FIRST AND LAST NAME: Kathleen Furness  FACILITY NAME: n/a TITLE: n/a  CALLERS RELATIONSHIP:Spouse  RETURN CALL: Detailed message on voicemail only     SUBJECT: General Message   REASON FOR REQUEST: patients spouse is concerned the patient may have had a stroke. Patient was acting confused, was having issues with vocabulary and recall, and did not seem quite right. Did not know of any additional symptoms, but agreed to take patient to the ED. Spouse will be picking patient up and driving him to [place] ED    MESSAGE: see above

## 2013-01-19 NOTE — Telephone Encounter (Signed)
CONFIRMED PHONE NUMBER: [phone]  CALLERS FIRST AND LAST NAME: Trudi Ida  FACILITY NAME: n/a TITLE: n/a  CALLERS RELATIONSHIP:Spouse  RETURN CALL: Detailed message on voicemail only     SUBJECT: General Message   REASON FOR REQUEST: patients spouse is concerned the patient may have had a stroke. Patient was acting confused, was having issues with vocabulary and recall, and did not seem quite right. Did not know of any additional symptoms, but agreed to take patient to the ED. Spouse will be picking patient up and driving him to [facility] ED    MESSAGE: see above

## 2013-01-19 NOTE — Telephone Encounter (Signed)
Noted! Thank you

## 2013-01-19 NOTE — Telephone Encounter (Signed)
Spoke with Nicholos Johns they are in route to the ED right now.  Will call if they need a follow up appt.      FYI to Dr Burgess Estelle

## 2013-01-21 LAB — R/O MRSA

## 2013-01-27 ENCOUNTER — Encounter (INDEPENDENT_AMBULATORY_CARE_PROVIDER_SITE_OTHER): Payer: PRIVATE HEALTH INSURANCE | Admitting: Family Medicine

## 2013-02-04 ENCOUNTER — Other Ambulatory Visit: Payer: Self-pay

## 2013-02-16 ENCOUNTER — Other Ambulatory Visit (HOSPITAL_BASED_OUTPATIENT_CLINIC_OR_DEPARTMENT_OTHER): Payer: Self-pay | Admitting: Cardiovascular Disease

## 2013-02-16 NOTE — Telephone Encounter (Addendum)
The medication requested from the pharmacy does not match current notes. Please clarify the dose/sig and authorize the prescription.    Requested:    (1) Benazepril 20 mg tab - 1 tab daily - 90 tabs Last refill: 12/16/2012 (early request)    (2) Metoprolol ER 100 mg tab - 1 tab daily - 90 tabs Last refill: 12/16/2012 (early request)    Doses per Discharge Summary on 01/20/13 and [facility] Clinical Notes in [facility]:    (1) Benazepril 10 mg daily    (2) Metoprolol ER 50 mg daily    Routing to Dr.Samson: did you want to reduce the directions on the label to 1/2 tablet daily for each prescription for 45 tablets (90 day supply)?    Routing to PCP, Dr.Tanner, because she mentioned lightheadedness at the 12/19/12 visit. The patient was admitted for micturition syncope on 01/19/13 and the doses were dispensed at the lower doses.    Routing to Clinical Pool: Please ask the patient if he reduced to taking 1/2 tablet of the benazepril 20 mg tablets and 1/2 tablet of the metoprolol ER 100 mg tablets as instructed by Dr.Samson on 02/02/2012.

## 2013-02-16 NOTE — Telephone Encounter (Signed)
Benazepril 20 mg tablets - 1 tab daily    Fill history:  04/23/11 - 90 tabs  07/16/11 - 90 tabs  03/15/12 - 90 tabs  08/11/12 - 90 tabs  10/14/12 - 90 tabs  12/19/12 - 90 tabs    Metoprolol ER 100 mg tabs - 1 tab daily    Fill history:  04/07/11 - 90 tabs  07/01/11 - 90 tabs  08/30/11 - 90 tabs  03/18/12 - 90 tabs  08/11/12 - 90 tabs  10/14/12 - 90 tabs  12/19/12 - 90 tabs

## 2013-02-16 NOTE — Telephone Encounter (Addendum)
Elvin So, MD, Diya on 20 January 2013 21:03   benazepril 10 mg oral tablet    Dose: 10 mg PO QAM  metoprolol succinate 50 mg oral tablet, extended release    Dose: 50 mg PO Daily    Angelena Sole, MD, Orlene Erm on 12 Sep 2012 13:22   Benazepril 10 mg a day.  Metoprolol succinate XL 50 mg a day.  Blood pressure 100/50 sitting, 110/60 standing, pulse 64 per minute    Angelena Sole, MD, Orlene Erm on 09 June 2012 18:54   Benazepril 10 mg a day.  Metoprolol succinate XL 50 mg a day.    Angelena Sole, MD, Orlene Erm on 29 March 2012 16:27   In addition to that, he is on the benazepril 10 mg a day, metoprolol succinate XL 50 mg a day, simvastatin 40 mg a day, tamsulosin 0.4 mg a day, Serevent inhaler twice a day, tiotropium inhaler once a day, enteric-coated aspirin 81 mg a day, pantoprazole 40 mg twice a day, and multivitamins.    Angelena Sole, MD, Orlene Erm on 02 February 2012 16:27   His blood pressure was quite low last time. I was concerned about it, and we cut the benazepril from 20 mg to 10 mg a day.  He is currently on metoprolol succinate XL 100 mg a day and I am going to cut it to 50 mg a day because his blood pressure is still low.    Angelena Sole, MD, Orlene Erm on 05 January 2012 12:39   I am going to cut the benazepril from 20 mg to 10 mg a day and right now keep him on metoprolol succinate XL 100 mg a day, nifedipine ER 90 mg a day.

## 2013-02-16 NOTE — Telephone Encounter (Signed)
Per patient:    He is currently taking 100 mg Metoprolol, instructed him to take 50 mg Metoprolol daily as prescribed at discharge 01/20/13.    He is taking 20 mg Benazepril, instructed him to take 10 mg Benazepril as prescribed at discharge 01/20/13.     He is holding his Tamsulosin as instructed.     BP has been 105/50-60, HR 60's, denies syncope.  He does have some SOB which he attributes to his emphysema.      Patient agreed to take medications as prescribed at discharge.    Enid Cutter, RN

## 2013-02-16 NOTE — Telephone Encounter (Signed)
Doses prescribed do not match doses per the documentation note.    See 02/16/13 Encounter.

## 2013-02-17 MED ORDER — METOPROLOL SUCCINATE ER 100 MG OR TB24
50.0000 mg | EXTENDED_RELEASE_TABLET | Freq: Every day | ORAL | Status: DC
Start: 2013-02-16 — End: 2013-05-09

## 2013-02-17 MED ORDER — BENAZEPRIL HCL 20 MG OR TABS
10.0000 mg | ORAL_TABLET | Freq: Every day | ORAL | Status: DC
Start: 2013-02-16 — End: 2013-09-05

## 2013-02-24 ENCOUNTER — Other Ambulatory Visit (INDEPENDENT_AMBULATORY_CARE_PROVIDER_SITE_OTHER): Payer: Self-pay | Admitting: Family Medicine

## 2013-02-25 MED ORDER — TAMSULOSIN HCL 0.4 MG OR CAPS
ORAL_CAPSULE | ORAL | Status: DC
Start: 2013-02-24 — End: 2013-09-19

## 2013-03-12 ENCOUNTER — Encounter (INDEPENDENT_AMBULATORY_CARE_PROVIDER_SITE_OTHER): Payer: Self-pay | Admitting: Family Medicine

## 2013-03-13 NOTE — Telephone Encounter (Signed)
Spoke with patient and scheduled 9 am appt on 11/26 with Maximiano Coss. He also stated he has no more Hydromorphone left. He only uses it occasionally for back pain.

## 2013-03-13 NOTE — Telephone Encounter (Signed)
Routing to [facility] pool to assist with scheduling.  Routing to PCP regarding refill, would you prefer pt be seen first for refill?

## 2013-03-15 NOTE — Telephone Encounter (Signed)
Dear PSRs--  Any chance that we can get Mr. Mandell squeezed into the schedule sometime in the [place] future? It does not have to be a 40 minute visit--can be shorter.  Can you call him to get him scheduled?    Thanks!  Wei Newbrough

## 2013-03-21 ENCOUNTER — Encounter (INDEPENDENT_AMBULATORY_CARE_PROVIDER_SITE_OTHER): Payer: Self-pay | Admitting: Family Medicine

## 2013-03-22 ENCOUNTER — Encounter (INDEPENDENT_AMBULATORY_CARE_PROVIDER_SITE_OTHER): Payer: PRIVATE HEALTH INSURANCE | Admitting: Medical

## 2013-03-30 ENCOUNTER — Encounter (INDEPENDENT_AMBULATORY_CARE_PROVIDER_SITE_OTHER): Payer: Self-pay | Admitting: Counselor

## 2013-03-30 ENCOUNTER — Ambulatory Visit (INDEPENDENT_AMBULATORY_CARE_PROVIDER_SITE_OTHER): Payer: No Typology Code available for payment source | Admitting: Family Medicine

## 2013-03-30 ENCOUNTER — Encounter (INDEPENDENT_AMBULATORY_CARE_PROVIDER_SITE_OTHER): Payer: Self-pay | Admitting: Family Medicine

## 2013-03-30 VITALS — BP 159/76 | HR 58 | Temp 98.0°F | Resp 18 | Wt 205.4 lb

## 2013-03-30 MED ORDER — CITALOPRAM HYDROBROMIDE 20 MG OR TABS
ORAL_TABLET | ORAL | Status: DC
Start: 2013-03-30 — End: 2013-09-19

## 2013-03-30 MED ORDER — TRIAMCINOLONE ACETONIDE 0.1 % EX OINT
1.0000 | TOPICAL_OINTMENT | Freq: Two times a day (BID) | CUTANEOUS | Status: DC
Start: 2013-03-30 — End: 2014-09-06

## 2013-03-30 MED ORDER — HYDROMORPHONE HCL 4 MG OR TABS
ORAL_TABLET | ORAL | Status: DC
Start: 2013-03-30 — End: 2013-05-16

## 2013-03-30 NOTE — Progress Notes (Signed)
S;  Pt is here for follow up several issues:. He is accompanied by his wife Elonda Husky. Elonda Husky is a strong advocate for [employer].    1) depression/EtOH  Citalopram not helping  Does not notice any difference at all  A significant problem right now, especially according to his wife is--etoh--he ca go without completely for several weeks at a time though will end up going on binges that can last about 2 days  + FH alcoholism  No SI  No real significant family support network here  limited group of friends  30 day program does not seem to cut it--he did not last 90 days after graduation from the last 30 day program  Elonda Husky is concerned that he has not handled retirement well--he is less busy now than he was when he was working. 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 and Elonda Husky was worried about him doing this woodworking when he was home alone--worried that he might have a significant injury and not be able to get help in a appropriate amount of time  Back wa also troubling him while he was doing the woodworking--had some trouble standing at the woodworking table for prolonged periods of time  Regarding the Etoh--he has had some counseling apart from the 30 day programs, though he did not find that it was that helpful  He still does go to [employer] daily except when he is drinking--he has not called his sponsor on the days that he decides to drink.    Thinks that he might not be keeping his mind active enough--hence falls back to etoh    Did go to counselor about 3-4 times for the depression--again did not find it that helpful    Elonda Husky is concerned about drinking and driving and is looking at getting an interlock system for his  car--he is not opposed to that idea    2) Re BP: bp at home usually 120's systolic--does some self adjustment--Takes nifedipine prn    3) Had labs done at a health fair: these results are scanned into his chart.  creat 1.43  Chol T; 139, H 47 L 74  TSH N  Vit D N  CBC : virtually N  BP was 116/66 at that time (02/18/2013)    O:  Pt alert and cooperative and in NAD.  BP 159/76  Pulse 58  Temp(Src) 98 F (36.7 C) (Temporal)  Resp 18  Wt 205 lb 6.4 oz (93.169 kg)  BMI 28.66 kg/m2  SpO2 96%    A/P:  1) depression complicated by Etoh binges.  We discussed the interplay of these two phenomena  i think that it might be worthwhile to try to increase the citalopram a bit--to 30 mg daily right now--to see if that might be helpful  I had the patient and his wife talk with Maple Mirza our care coordinator to give them options for community resources for etoh programs.  Annette Stable does not want a program now to interfere with the work that he is doing for his old company and is also concerned about cost.  He should consider getting in touch with his AS sponsor if he feels that he might binge.  I would like to see him again relatively soon to see how he is doing.    Should consider getting repeat EKG to check for QT interval--last ekg was sept 2014.  He does have an appt with hs cardiologist soon.    (311) Depression  Plan: REFERRAL TO PSYCHIATRY, Citalopram Hydrobromide        20 MG Oral Tab        Will also have him seen by psychiatry to assess the medication regime.    2) BP: elevated her on arrival, though i trust the readings he has been getting at home. Will keep him on current meds.    (692.9) Eczema  (primary encounter diagnosis)  Plan: Triamcinolone Acetonide 0.1 % External Ointment        He uses this on his legs mostly    (724.5) Back pain  Plan: HYDROmorphone HCl 4 MG Oral Tab        Warned again about the abuse potential of this medication, particularly in light of the etoh issues. Pt clearly understands and uses this  med sparingly    Total time of 40 minutes was spent face-to-face with the patient, of which more than 50% was spent counseling and coordinating care  as outlined in this note.      Tana Coast, MD

## 2013-03-30 NOTE — Patient Instructions (Signed)
Re citalopram: take 1 1/2 tabs daily

## 2013-03-30 NOTE — Progress Notes (Signed)
Provider requested Health Navigator to meet with Patient.     Patient needed assistance with finding a counselor for alcohol abuse. Patient stated he was interested in counseling. We discussed [facility] [facility] and informed him that they have an intensive outpatient treatment but have a long waiting list. I advised them to connect with [facility] and get him on the waiting list. The wife was present during the visit and stated she is willing to assist. I also provided them with the contact information to [facility], [facility], & [facility]. Patient lives [place] in [place]. I advised them to connect with [facility] as it is closer to their home. Also, I was advised that they might have a Provider who specializes in Alcohol Abuse. They informed me that they would connect with [facility] and see if he can get established as a Patient there.     I informed him that I would connect with him in a week to follow up.

## 2013-04-14 ENCOUNTER — Ambulatory Visit (INDEPENDENT_AMBULATORY_CARE_PROVIDER_SITE_OTHER): Payer: BLUE CROSS/BLUE SHIELD | Admitting: Family Medicine

## 2013-04-14 VITALS — BP 137/70 | HR 57 | Temp 97.6°F | Resp 16 | Wt 204.0 lb

## 2013-04-14 NOTE — Progress Notes (Signed)
S:  Mr Kronenberger is here for follow up depression and recent problems with alcohol  Taking the depression med--Seems to feel a bit better  etoh--pretty good though fell off wagon once since last visit    Has appt in [place] on Monday for outpatient counseling which is long term for alcohol rehabilitation    I asked him how his wife Elonda Husky might see his progres--he figures that she might not see quite the same improvement as he does.    Not doing much ex  Sleeping well    PHQ9 was 4 today   GAD7 was 1    O;  Pt alert and cooperative and in NAD.  BP 137/70  Pulse 57  Temp(Src) 97.6 F (36.4 C)  Resp 16  Wt 204 lb (92.534 kg)  BMI 28.46 kg/m2  SpO2 93%    A/P:  Depression--mild improvement. E is encouraged to keep the appt with the counselor.  I did get an ekg today to check for QT prolongation which could be due to SSRI--no QT prolongation seen.  Pt does have appt with his cardiologist in the next few weeks.    Will keep him on his current meds.    i would like to see him again in one  Month.    Tana Coast, MD

## 2013-04-24 ENCOUNTER — Other Ambulatory Visit: Payer: Self-pay | Admitting: Family Medicine

## 2013-04-24 MED ORDER — GABAPENTIN 100 MG OR CAPS
100.0000 mg | ORAL_CAPSULE | Freq: Every evening | ORAL | Status: DC
Start: 2013-04-24 — End: 2013-09-19

## 2013-05-08 ENCOUNTER — Other Ambulatory Visit: Payer: Self-pay | Admitting: Pharmacist

## 2013-05-08 DIAGNOSIS — J449 Chronic obstructive pulmonary disease, unspecified: Secondary | ICD-10-CM

## 2013-05-08 MED ORDER — TIOTROPIUM BROMIDE MONOHYDRATE 18 MCG IN CAPS
18.0000 ug | ORAL_CAPSULE | Freq: Every day | RESPIRATORY_TRACT | Status: DC
Start: 2013-05-08 — End: 2013-08-09

## 2013-05-08 NOTE — Telephone Encounter (Signed)
Next visit 04/2013

## 2013-05-09 ENCOUNTER — Ambulatory Visit
Payer: No Typology Code available for payment source | Attending: Cardiovascular Disease | Admitting: Cardiovascular Disease

## 2013-05-09 VITALS — BP 120/60 | HR 60 | Wt 203.0 lb

## 2013-05-09 DIAGNOSIS — I10 Essential (primary) hypertension: Secondary | ICD-10-CM | POA: Insufficient documentation

## 2013-05-09 MED ORDER — METOPROLOL SUCCINATE ER 100 MG OR TB24
100.0000 mg | EXTENDED_RELEASE_TABLET | Freq: Every day | ORAL | Status: DC
Start: 2013-05-09 — End: 2013-09-19

## 2013-05-10 NOTE — Progress Notes (Signed)
Tracy, Conway          [id number]          05/09/2013      CARDIOLOGY CLINIC NOTE     [name] is really quite stable.  He intentionally wanted to lose weight.  I was a little bit concerned last time because he had a significant weight loss and his appetite was down, although he tells me now his appetite is good.  He has no abdominal discomfort.  No nausea or vomiting and normal bowel movements.  He has not had any chest pain, and his shortness of breath is really unchanged from his prior visit.  He gets dyspneic walking anywhere from 2 to 4 blocks.  His blood pressure usually is around 120/60 and he is back on his 30 mg of long-acting nifedipine.  He has not had any episodes of lightheadedness.    PHYSICAL EXAMINATION     VITAL SIGNS:  Blood pressure 120/60 sitting, 110/60 standing, pulse 60 per minute, weight 203 pounds, which is up 2 pounds over the last 4 months.  No increased systemic venous pressure.  He has a grade 1 systolic bruit on the right, grade 2 on the left.  LUNGS:  Entirely clear.  HEART:  Regular rhythm.  There is a grade 2 aortic systolic ejection murmur with radiation to the neck.  No gallops, heaves or percussive cardiomegaly.  ABDOMEN:  Obese. No masses.  EXTREMITIES:  No peripheral edema.  Good posterior tibial pulses.    LABORATORY DATA     He had recently  some lab work done on the outside.  Of note is that his creatinine was 1.43 mg %.  It was 1.68 mg % when we checked it last September.  Also, his uric acid is elevated to 7.6 mg %.  We have seen it as high as 7.9 mg %, when checked here.  He never did have gout.  His total cholesterol was 139 mg %, triglycerides 90 mg %, HDL cholesterol 47 mg %, and calculated LDL cholesterol of 74 mg %.    ASSESSMENT AND PLAN     He is to continue on the medication as outlined, except that I told him to cut the pantoprazole to 40 mg every other day, and we may want to discontinue it entirely if he has no evidence of acid reflux.    One other  problem is that he still has some difficulty staying away from alcohol, so that he is now not only going to Alcoholics Anonymous, but also to some other entity for people who have alcohol problems.

## 2013-05-16 ENCOUNTER — Encounter (INDEPENDENT_AMBULATORY_CARE_PROVIDER_SITE_OTHER): Payer: Self-pay | Admitting: Family Medicine

## 2013-05-16 ENCOUNTER — Ambulatory Visit (INDEPENDENT_AMBULATORY_CARE_PROVIDER_SITE_OTHER): Payer: No Typology Code available for payment source | Admitting: Family Medicine

## 2013-05-16 VITALS — BP 110/58 | HR 58 | Temp 99.3°F | Resp 16 | Wt 198.1 lb

## 2013-05-16 DIAGNOSIS — J449 Chronic obstructive pulmonary disease, unspecified: Secondary | ICD-10-CM

## 2013-05-16 DIAGNOSIS — R059 Cough, unspecified: Secondary | ICD-10-CM

## 2013-05-16 DIAGNOSIS — J189 Pneumonia, unspecified organism: Secondary | ICD-10-CM

## 2013-05-16 DIAGNOSIS — M549 Dorsalgia, unspecified: Secondary | ICD-10-CM

## 2013-05-16 MED ORDER — AZITHROMYCIN 250 MG OR TABS
ORAL_TABLET | ORAL | Status: DC
Start: 2013-05-16 — End: 2013-06-23

## 2013-05-16 MED ORDER — ALBUTEROL SULFATE HFA 108 (90 BASE) MCG/ACT IN AERS
2.0000 | INHALATION_SPRAY | RESPIRATORY_TRACT | Status: DC | PRN
Start: 2013-05-16 — End: 2013-09-19

## 2013-05-16 MED ORDER — HYDROMORPHONE HCL 4 MG OR TABS
ORAL_TABLET | ORAL | Status: DC
Start: 2013-05-16 — End: 2013-06-20

## 2013-05-16 NOTE — Telephone Encounter (Signed)
Routing to Dr. Tanner.

## 2013-05-16 NOTE — Patient Instructions (Signed)
You should consider getting the 13-valent pneumococcal vaccine--"prevnar"

## 2013-05-16 NOTE — Progress Notes (Signed)
GAD-7 (Generalized Anxiety Disorder screening tool)  How often during the past 2 weeks have you felt bothered by:  0 = not at all  1 = several days  2 = more than half the days  3 = nearly everyday   1. Feeling nervous, anxious, or on edge  0   2. Not being able to stop or control worrying  0   3. Worrying too much about different things  0   4. Trouble relaxing  0   5. Being so restless that it is hard to sit still  0   6. Becoming easily annoyed or irritable  0   7. Feeling afraid as if something awful might happen  0                                          TOTAL:   0   If you checked off any problems, how difficult have these problems made it for you to do your work, take care of things at home, or get along with other people? not difficult at all    Scoring: Add the results for question number one through seven to get a total score.  If you score 10 or above you might want to consider one or more of the following: discuss your symptoms with your doctor, contact a local mental health care provider or contact my office for further assessment and possible treatment. Although these questions serve as a useful guide, only an appropriate licensed health professional can make the diagnosis of Generalized Anxiety Disorder.

## 2013-05-16 NOTE — Progress Notes (Signed)
S:  Pt is here for follow up  Registered at [facility] treatment 4 days per week for ETOH rehab, still goes to [employer] meetings at night  Sober x 30  Days (was 27 years prior)    Also has problem with URI/cough  Had cold over weekend--was breathing "60 times a minute"--just could not get any air.  Had steroids in the past that helped  Coughing--lots of phlegm which is clear to whitish  Hot and cold over teh weekend  Feels chilly now  Now will get SOB with a little movement   Up 2 flights of staris at home pretty trying, usually pretty OK.  Needs to rest after some exertion.    Also hurt one of his ribs--fell off ladder--the bottom step.  Using advair and spriva  Using wife [name] albuterol  Ddi have a flu shot this year    Wonders about Parameters for getting on home O2    Patient Active Problem List    Diagnosis Date Noted   . CORONARY ARTERY DISEASE [410.90] 12/30/2010     Non-ST elevation myocardial infarction., July 18,2012  LAD drug-eluting stent nov, 2012. Clopidogrel  See [name], cardiology       . Malignant neoplasm of prostate [185] 08/14/2010     Diagnosed 07/2010. Sees [name] (urol) and [name] (rad onc). Saw [name], [name]--[facility] [place]--palladium seed implants June 2012  Mr. Gritton has an intermediate risk prostate cancer on the basis of a Gleason 7 histology       . Lumbago [724.2] 12/19/2009     March 21, 2012, underwent L4-L5 bilateral laminectomies and partial medial facetectomies, as well as an L4-L5 right-sided synovial cyst resection. He says since surgery he feels better       . Routine general medical examination at a health care facility [V70.0] 05/15/2009     06/07/2012  Td/Tdap (q 10 yr):  2004, 2012  Pneumococcal (65 yoa and high risk x1):  2010, 2004  Influenza vaccine (q 1 yr):  2010 nov, 2011, 2012, 2013  Shingles: discussed--should get at his local pharmacy  Blood pressure:  Excellent 138/64  121/72  Cholesterol (q 5 yr begin age 62):on simvastatin oct  2012: excellent--will repeat  AAA screen (x1, male age 38, if ever smoked):  Checked at [place]-- abdo and thoracic CT--aug 2012: stable ascending aortic aneurysm  Colorectal cancer: ct colonoscopy done 2008, repeat due 2013--done April 2013: neg   PSA:  Sees urology for fu prostate ca. Has had palladium seed implants  Tobacco:  NS x 11 yrs, 50 pack yr hx  Exercise:  Reg--treadmill x 20-30 mins then weight machines--every other day! Has not been back to the gym because of the back pain  Alcohol /Drugs:  None in 26 yrs--relapse jan 2014--did inpatient treatment--now none in 37 days!  Sexual practice/contraception:  Not an issue  Advanced directives:  Does have living will. DPOA  done       . GENERAL OSTEOARTHROSIS [715.00] 01/07/2007     Has seen orthopedics--cortisone shot to L knee helped.  L knee replacement 2008     . DIVERTICULITIS OF COLON W/O BLEED [562.11] 01/07/2007     Hospitalized at [facility] May 2008     . TOBACCO USE DISORDER [305.1] 12/24/2005     Quit 2000.  50 pack year history  EtOH--quit 1986.     Marland Kitchen HEARING LOSS NOS [389.9] 12/24/2005     Bilat hearing aids.     Marland Kitchen  ECZEMA  [692.9] 12/24/2005     Lower extremities in particular.     Marland Kitchen ERECTILE DYSFUNCTION [607.84] 12/24/2005   . BENIGN NEOPLASM LG BOWEL [211.3] 12/24/2005     Tubular adenoma. Dx'ed flex sig 2000--> colonoscopy 2000: N.  F/U 2003: mild diverticulosis, tubular adenoma.  F/U due 2008 with CT colonography.--done 7/08: ess. N.  Next due 2013       . CALCULUS OF KIDNEY [592.0] 12/24/2005     Lithotripsy 1988  Stone panel in 0ct 09: high oxalate. Given low oxalae diet by Dr. Arbie Cookey, nephrology in 2009.       Marland Kitchen COPD [496] 12/24/2005     Emphysema: an FEV1 which is approximately at 33% of predicted    04/2010--Pulm: Chronic obstructive pulmonary disease, severe to very severe. I explained to him that continuing on the Spiriva as well as the Serevent was likely good therapy.I further told him that if he should have recurrent exacerbations within  the course of the year that I would consider adding an inhaled corticosteroid at that point in time. He does not need oxygen at this point in time. He remains a nonsmoker. He is already engaged in a pulmonary rehabilitation.    Today I described to him how overall lung volume reduction surgery had no significant benefit on mortality. Subgroup analysis did indicate that those patients, who had upper lobe predominant disease with low exercise capacity were the most likely to benefit. Given the fact that he actually still has reasonably good functional status and is getting around and going to the gym and working out, it is not clear that he would be in the group that would most likely benefit, although, to safe for certain, we need to get a cardiopulmonary exercise test. At this point in time, he is feeling good with his chronic obstructive pulmonary disease therapy and is not interested in pursuing lung volume reduction surgery.    05/20/2010 CT angio: Addendum: Increased scarring in the right lung base since the prior   exam including the interval development of a new nodule. The   appearance suggests that the patient has had a recent infection of   the right lower lobe has resolved and since the prior scan. However   recommend 3 months followup of the right lower lobe lesion given that   the patient is high risk with extensive paraseptal emphysema.     08/03/11 CT Chest  1. New indeterminate nodules in the lower lobes bilaterally, the largest   nodule measures 10 x 12 mm in the paraspinal location in the right lower lobe.  Possible considerations include infectious/inflammatory and less likely   metastasis.   2. Bilateral upper lobe predominant moderate centrilobular and paraseptal   emphysema.   3. Unchanged dilated main pulmonary artery measuring 32 mm may indicate   pulmonary arterial hypertension.   4. Aortic aneurysm involving the ascending aorta at the level of the main   pulmonary artery measuring 47 mm.                   . Thoracic aneurysm without mention of rupture [441.2] 12/24/2005     Ascending aortic aneurysm.  Followed by Dr. Brunilda Payor, Cardiac surgery.  Gets regular CT scans.  Goal systolic BP: 100-120  Also seeing Dr. Angelena Sole cardiology  Also has AAA     . Abdominal aneurysm without mention of rupture [441.4] 12/24/2005     Father also had AAA. Borderline, seen on CT 8/06.  Also has  thoracic aneurysm     . VENTRICULAR SEPT DEFECT [745.4] 12/24/2005     Small muscular VSD--NEEDS SBE PROPHYLAXIS. (Clindamycin 600 mg prior to dental visits.)     . HYPERTROPHY PROSTATE W/O OBST [600.00] 12/24/2005   . Chronic Kidney Disease -- Stage III [588.89] 07/22/2005     Creat ~ 1.8. Normal albuminuria (8/08)  Likely 2/2 HTN, poss component from hx obstructing nephrolithiasis.  8/08 Renal: referred to Uro for 35mm stone, nonobstructing. Also doing w/u for stone type, poss target prevention.     Marland Kitchen HYPERTENSION NOS(aka HTN) [401.9] 03/29/2002   . PURE HYPERCHOLESTEROLEM [272.0] 03/29/2002   . 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 Chronic obstructive pulmonary disease [496]      TX FROM [facility]       . History of non-ST elevation myocardial infarction (NSTEMI) [412]      TX FROM [facility]       . History of alcohol dependence [303.93]      TX FROM [facility]       . Carotid stenosis [433.10]      TX FROM [facility]       . Depression [311] 11/10/2012   . Lumbar stenosis with neurogenic claudication [724.03] 02/05/2012     March 21, 2012, underwent L4-L5 bilateral laminectomies and partial medial facetectomies, as well as an L4-L5 right-sided synovial cyst resection. He says since surgery he feels better         Outpatient Prescriptions Prior to Visit   Medication Sig Dispense Refill   . Aspirin 81 MG Oral Tab Once daily       . Benazepril HCl 20 MG Oral Tab Take 0.5 tablets (10 mg) by mouth daily. (Dose per  discharge summary on 01/20/13)  45 tablet  3   . Citalopram Hydrobromide 20 MG Oral Tab 1 1/2 tabs po daily  135 tablet  3   . Fluticasone-Salmeterol (ADVAIR DISKUS) 250-50 MCG/DOSE Inhalation AEROSOL POWDER, BREATH ACTIVATED Inhale 1 puff by mouth 2 times a day.       . Gabapentin 100 MG Oral Cap Take 1 capsule (100 mg) by mouth at bedtime.  30 capsule  2   . HYDROmorphone HCl 4 MG Oral Tab 1 tablet(s) by mouth every 12 hours if needed to relieve pain  60 tablet  0   . Metoprolol Succinate ER 100 MG Oral TABLET SR 24 HR Take 1 tablet (100 mg) by mouth daily. (Dose per discharge summary on 01/20/13)  90 tablet  3   . Multiple Vitamins-Minerals (MULTIVITAMIN OR) Once daily       . NIFEdipine ER 30 MG Oral TABLET SR 24 HR Take 1 tablet (30 mg) by mouth.       . Pantoprazole Sodium 40 MG Oral Pack 1 tab PO BID.       Marland Kitchen Simvastatin 40 MG Oral Tab TAKE 1 TABLET DAILY IN THE EVENING FOR CHOLESTEROL  90 Tab  4   . Tamsulosin HCl 0.4 MG Oral Cap TAKE 1 CAPSULE BY MOUTH AT BEDTIME  90 capsule  2   . Tiotropium Bromide Monohydrate (SPIRIVA HANDIHALER) 18 MCG Inhalation Cap Inhale contents of 1 capsule (18 mcg) by mouth daily. (via handihaler)  90 capsule  0   . Triamcinolone Acetonide 0.1 % External Ointment Apply 1 application topically 2 times a day. Apply to affected areas  bid  80 g  1     No facility-administered medications prior to visit.     O:  Pt alert and cooperative and in NAD.  BP 110/58  Pulse 58  Temp(Src) 99.3 F (37.4 C) (Temporal)  Resp 16  Wt 198 lb 2 oz (89.869 kg)  BMI 27.65 kg/m2  SpO2 93%  Chest: A/E good bialt though somewhat reduced at R base compared to the L  Also has significant crackles at R base    CXR: looks like he might hav pneumonia in R middle lobe and perhaps R lower lobe    A/P:  CAP--will treat with azithromycin  If he gets worse, needs to come in   i would like to see him again nonetheless in several weeks    (786.2) Cough  (primary encounter diagnosis)  Plan: X-RAY CHEST 2 VW,  Azithromycin 250 MG Oral Tab,        Albuterol Sulfate HFA 108 (90 BASE) MCG/ACT         Inhalation Aero Soln            (486) CAP (community acquired pneumonia)  Plan: Azithromycin 250 MG Oral Tab            (496) COPD (chronic obstructive pulmonary disease)  Plan: REFERRAL TO PULMONOLOGIST, Albuterol Sulfate         HFA 108 (90 BASE) MCG/ACT Inhalation Aero Soln        Will refer him to pulmonology for consideration of home O2  He has seen them in the past though not in some time      (724.5) Back pain  Plan: HYDROmorphone HCl 4 MG Oral Tab        This medication was refilled. Usual warnings given to pt who voiced understanding.    Levora Angel, MD

## 2013-05-16 NOTE — Progress Notes (Signed)
PATIENT HEALTH QUESTIONNAIRE (PHQ-9)  Over the last 2 weeks, how often have you been bothered by any of the following problems?    1. Little interest or pleasure in doing things: several days  (1)  2. Feeling down, depressed, or hopeless: several days  (1)  3. Trouble falling or staying asleep,or sleeping too much: several days  (1)  4. Feeling tired or having little energy: more than half the days (2)  5. Poor appetite or overeating: several days  (1)  6. Feeling bad about yourself-or that you are a failure or have let yourself or your family down: not at all (0)  7. Trouble concentrating on things, such as reading the newspaper or watching television: not at all (0)  8. Moving or speaking so slowly that other people could have noticed. Or the opposite-being so fidgety or restless that you have been moving around a lot  more than usual: not at all (0)  9. Thoughts that you would be better off dead, or of hurting yourself in some way: not at all (0)    Total Score Depression Severity  1-4 Minimal depression  5-9 Mild depression  10-14 Moderate depression  15-19 Moderately severe depression  20-27 Severe depression    10. If you checked off any problems, how difficult have these problems made it for  you to do your work, take care of things at home, or get along with other people? Somewhat difficult    Total Score:  6  ---------------------------------------------------------------------------------------------------------  PHQ-9 is adapted from [employer], developed by Drs Herbie Baltimore L. Spitzer, Loel Dubonnet, [employer], and colleagues, with an educational grant from [employer]. For research information, contact Dr Luana Shu at [email or web address]. Use of the PHQ-9 may only be made in accordance with the Terms of Use available at 22 May. Copyright [employer]. All rights reserved. [employer] is a [employer] Scientific laboratory technician.

## 2013-05-17 ENCOUNTER — Telehealth (INDEPENDENT_AMBULATORY_CARE_PROVIDER_SITE_OTHER): Payer: Self-pay | Admitting: Family Medicine

## 2013-05-17 NOTE — Telephone Encounter (Signed)
Routing to provider to advise.  

## 2013-05-17 NOTE — Telephone Encounter (Signed)
CONFIRMED PHONE NUMBER: [phone]  CALLERS FIRST AND LAST NAME: Zoe Lan  FACILITY NAME: na TITLE: na  CALLERS RELATIONSHIP:Self  RETURN CALL: General message OK     SUBJECT: Letter/Form Request   REASON FOR REQUEST: Release for Patient to return to his alcohol abuse meetings    WHAT IS THE LETTER/FORM FOR: Other:   WHO SHOULD FILL IT OUT: [facility]  WHERE SHOULD IT BE SENT: [facility]  Fax # [phone]  Attention Manuela Schwartz    DATE NEEDED BY: ASAP    Please call the Patient back once the letter has been sent to the facility listed above.  Thank you.

## 2013-05-18 ENCOUNTER — Telehealth (HOSPITAL_BASED_OUTPATIENT_CLINIC_OR_DEPARTMENT_OTHER): Payer: Self-pay

## 2013-05-18 NOTE — Telephone Encounter (Signed)
CONFIRMED PHONE NUMBER: [phone]  CALLERS FIRST AND LAST NAME: Zoe Lan    CALLERS RELATIONSHIP:Self  RETURN CALL: Detailed message on voicemail only     SUBJECT: General Message   REASON FOR REQUEST: referral status    MESSAGE: caller would like to be called when he can schedule his referral. He specifically would like to be seen with Dr. Maxcine Ham. Please advise.

## 2013-05-18 NOTE — Telephone Encounter (Signed)
Routing to Dr. Tanner.

## 2013-05-18 NOTE — Telephone Encounter (Signed)
Faxed to [facility] with confirmation. Called the patient

## 2013-05-18 NOTE — Telephone Encounter (Signed)
Patient is calling in to check on the below status of letter for him to return to recovery services. I did let patient know any paperwork, forms or letters can take 3-5 business to process. Patient understands but would like a call once the letter is sent. ThankYou

## 2013-05-18 NOTE — Telephone Encounter (Addendum)
Letter written and placed for faxing.  Thanks!

## 2013-05-19 NOTE — Telephone Encounter (Signed)
Updated referral instructions

## 2013-05-31 ENCOUNTER — Ambulatory Visit (HOSPITAL_BASED_OUTPATIENT_CLINIC_OR_DEPARTMENT_OTHER): Payer: No Typology Code available for payment source

## 2013-05-31 ENCOUNTER — Encounter (HOSPITAL_BASED_OUTPATIENT_CLINIC_OR_DEPARTMENT_OTHER): Payer: Self-pay | Admitting: Internal Medicine

## 2013-05-31 ENCOUNTER — Ambulatory Visit: Payer: No Typology Code available for payment source | Attending: Internal Medicine | Admitting: Internal Medicine

## 2013-05-31 VITALS — BP 115/56 | HR 55 | Temp 97.0°F | Ht 71.0 in | Wt 201.5 lb

## 2013-05-31 DIAGNOSIS — J4489 Other specified chronic obstructive pulmonary disease: Secondary | ICD-10-CM | POA: Insufficient documentation

## 2013-05-31 DIAGNOSIS — J449 Chronic obstructive pulmonary disease, unspecified: Secondary | ICD-10-CM | POA: Insufficient documentation

## 2013-05-31 NOTE — Patient Instructions (Addendum)
It was great to see you in clinic today. The walking test we performed confirmed that you need oxygen with exercise.    1. Continue Advair and Spiriva.  2. Continue yearly influenza vaccination.  3. Continue pantoprazole.  4. We have faxed oxygen prescription to [facility] to use with exertion.  5. We will also ask them to work with you on finding a more comfortable CPAP mask.

## 2013-05-31 NOTE — Addendum Note (Signed)
Addended by: Erline Levine on: 05/31/2013 06:13 PM     Modules accepted: Level of Service

## 2013-05-31 NOTE — Progress Notes (Signed)
Respiratory Care Services  6 Minute Walk Preliminary Data Sheet  (Full Report and Interpretation Pending)    Referring Provider / Clinic: Erline Levine, MD/ Pulmonary    Gender:  M   Age:  79       Home Oxygen Therapy: (Yes / No)   no   Flow Rate:      l/min    At night (Yes/No)  No- Pt. Reports he has home CPAP FFM but is not using.    Supplemental Oxygen During the Test:  (Yes / No)   No- titration       Flow Rate:     l/min  continuous flow (Yes/No)       Walking Aid:  (Yes / No)   no   Description:        Site of pulse oximetry:  Finger:  yes  Forehead:    Earlobe:         Pulse oximetry: Baseline:  95 %   End of Test:   90 %      Heart rate:  Baseline:   54   End of Test:  75    Borg Dyspnea: Baseline:   0   End of Test:   4    Borg Leg Fatigue: Baseline:      End of Test:   5    Desaturation to 88%: Time:   3 min 25 sec.   Distance:   630  feet    Desaturation to 80%:  Time:      Distance:     feet         Stopped / Paused Before the 6 Minutes: (Yes / No)   no    Reason:       Distance Walked in 6 minutes   1105   feet       Oxygen Titration for patients who demonstrate saturation drop below 89% during the 6 minute walk test. Increase by 2 L. increments        Comments:   Pt. does not currently have home O2.  He reports to having a FFM CPAP but is not currently using - poor mask fit.    6 min. Walk test was initiated with Pt. On RA.  At 3 min. 25 sec., distance 630 ft., SPO2 decreased to 88%.  FIO2 was started at 2 lpm.  Lowest SPO2 during walk was 88%.    Total distance walked in 6 min.,- 1105 ft., SPO2 90%, HR 75.   Prescription was faxed to [facility]  For new start O2 - 2 lpm with exertion and portability. Also request to provide this Pt. With new interface options for his CPAP.

## 2013-05-31 NOTE — Progress Notes (Signed)
PULMONARY CLINIC NOTE     IDENTIFICATION/CHIEF COMPLAINT   Tracy. Tracy Conway is a 79 year old gentleman with severe COPD, here for followup of dyspnea.     INTERVAL HISTORY   I last saw Tracy. Tracy Conway in Pulmonary Clinic on August 10, 2012. At that time, he had stable 2 block dyspnea and no exacerbations. In addition, he had a history of hemoptysis in May, 2013 with a negative workup including bronchoscopy that was thought to be due to his dual antiplatelet agent use with aspirin and clopidogrel. He has since stopped clopidogrel.     Since last seen, Tracy Conway reports doing well. He started drinking alcohol again (first in spring 2013, then 1/14), but had his last drink on 04/26/13 and is attending rehab. He developed temperature to 101 and dyspnea in mid January and saw his PCP on 1/20; he was found to have pneumonia and treated with azithromcyin and prn albuterol. He reports improvement in his symptoms rapidly after beginning azithromycin and now feels that he is mostly back to baseline. He gets dyspneic with walking 1.5-2 blocks and admits he hasn't been exercising as much as previously. He works part-time from home and attends [employer] regularly. Reflux is improved on BID PPI. He is here to be evaluated for home oxygen. He plans on resuming his exercise regimen, which consists of walking on the treadmills for 15-20 minutes at a time. Has not been using CPAP (only used for a few months) because he finds the full-face mask uncomfortable.    PROBLEM LIST   1. Coronary artery disease, status post non-STEMI in 10/2010, status post multiple drug-eluting stents to the left circumflex and OM, a drug-eluting stent to the mid LAD in November 2012   2. Obstructive sleep apnea, on CPAP but not currently using.  3. Hypertension.  4. Hyperlipidemia.  5. Severe COPD.  6. Chronic kidney disease with a baseline creatinine of 1.5-1.8.  7. Spinal stenosis status post L4-L5 laminotomies, partial facet resection and right L4-5 synovial cyst  resection on March 21, 2012.  8. History of 50-pack-year smoking, quit in 2000.  9. Past alcohol dependence.    MEDICATIONS   1. Aspirin 81 mg po daily  2. Benazepril 10 mg po daily  3. Citalopram 20 mg po daily  4. Gabapentin 100 mg po qhs  5. Hydromorphone 2-4 mg po q4h prn  6. Metoprolol XL 50 mg po daily  7. Multivitamin daily  8. Nifedipine 30 mg po daily  9. Pantoprazole 40 mg po bid  10. Fluticasone/salmeterol 250/50 1 puff bid  11. Simvastatin 40 mg po qpm  12. Tiotropium 1 puff daily.    ALLERGIES   PENICILLIN CAUSES HIVES AND STIFF JOINTS.     PHYSICAL EXAMINATION   BP 115/56  Pulse 55  Temp(Src) 97 F (36.1 C) (Temporal)  Ht 5\' 11"  (1.803 m)  Wt 201 lb 8 oz (91.4 kg)  BMI 28.12 kg/m2  SpO2 93%  Gen: Pleasant, interactive.  HEENT: Anicteric  Lymph: No cervical or supraclavicular lymphadenopathy.  CV: Normal S1, S2 without murmurs.  Pulm: Quiet lung fields. Clear to auscultation throughout. No wheezing on forced exhalation.  Ext: Trace peripheral edema.    IMAGING/STUDIES   Laboratory data from 01/19/13: serum bicarbonate 22, creatinine 1.68, Hct 38%     Spirometry from 06/19/09: FVC 3.2 L (67% predicted), FEV1 1.1 L (31% predicted), FEV1/FVC 0.36.     Chest CT from 11/02/11: New focus of small airway inflammation in the right upper  lobe, stable fibrosis and peripheral calcification in the right lower lobe concerning for chronic aspiration.     2V CXR, 05/16/13: Worsening consolidation in the right mid and lower lungs representing pneumonia. Left lung is clear. No pleural effusion or pneumothorax. Heart size is normal.    Six minute walk test, 05/31/13: At rest, SpO2 95% with HR 54. After walking 630 feet, SpO2 fell to 88%. 2 L/min O2 was applied to keep oxygen saturation > 88%. Tracy. Tracy Conway ambulated 1105 feet. At the end of the test, SpO2 was 90% and heart rate was 75. Dypsnea was 4 and leg pain 5 on the Borg scale. This represents mildly impaired exercise tolerance and exertional hypoxemia.     ASSESSMENT    Tracy. Tracy Conway is a 79 year old gentleman who has a 50-pack-year smoking history (quit in 2000) with severe chronic obstructive pulmonary disease (FEV1 1.1 L in February 2011), here for followup and evaluation for home oxygen. He had a recent pneumonia, but reports rapid resolution of symptoms with antibiotic therapy. He does not otherwise have COPD exacerbations and has received influenza as well as pneumonia vaccination. He also has radiographic evidence of stable fibrosis in the right lower lobe concerning for chronic aspiration and his reflux symptoms are now improved with weight loss and BID proton pump inhibitor therapy.    Would recommend continuing current COPD therapy (fluticasone/salmeterol and tiotropium) and we evaluated for oxygen needs today. A six minute walk test confirmed exertional hypoxemia that was treated with 2 L/min supplemental oxygen. Will prescribe this through [facility] (his home care company) today to use with exertion. I've also assured Tracy. Tracy Conway that there are many interfaces to try for CPAP that may be more tolerable and we will ask [facility] to work with him to find something that he will be able to use regularly.    RECOMMENDATIONS   1. Continue fluticasone/salmeterol 250/50 mcg 1 puff inhaled bid and tiotropium 18 mcg 1 cap inhaled daily.  2. Continue pantoprazole bid and lifestyle modifications for GERD.  3. Prescription for home oxygen (2 L/min with exertion) faxed to [facility].  4. Will ask [facility] to work with Tracy. Tracy Conway to find a more comfortable CPAP interface.  5. Encouraged yearly influenza vaccination.  6. Return to clinic in one year or earlier as needed.

## 2013-06-01 ENCOUNTER — Telehealth (INDEPENDENT_AMBULATORY_CARE_PROVIDER_SITE_OTHER): Payer: Self-pay | Admitting: Family Medicine

## 2013-06-01 NOTE — Telephone Encounter (Signed)
Left Tracy Conway a voice mail. Will try to reach her tomorrow or early next week.

## 2013-06-01 NOTE — Telephone Encounter (Signed)
Routing to provider to advise.  

## 2013-06-01 NOTE — Telephone Encounter (Signed)
CONFIRMED PHONE NUMBER: [phone]  CALLERS FIRST AND LAST NAME: Manuela Schwartz  FACILITY NAME: [facility] TITLE: Chemical Dependency Professional  CALLERS RELATIONSHIP:OTHER: Chemical Dependency Professional  RETURN CALL: Detailed message on voicemail only     SUBJECT: General Message   REASON FOR REQUEST: Patient status    MESSAGE: Susam states that the patient is using his wife's pain medication in addition to him taking his own prescribed medication. Manuela Schwartz would like to know if there is anything else that she would need to be aware of or advise for the patient.

## 2013-06-06 ENCOUNTER — Telehealth (HOSPITAL_BASED_OUTPATIENT_CLINIC_OR_DEPARTMENT_OTHER): Payer: Self-pay | Admitting: Internal Medicine

## 2013-06-06 NOTE — Telephone Encounter (Signed)
Forwarded message to [facility]  Respiratory therapist.

## 2013-06-06 NOTE — Telephone Encounter (Signed)
Patient states that Dr. Maxcine Ham wanted him to try on different masks for his oxygen, but [facility] states he needs a prescription.  Please follow up.  Thank you.

## 2013-06-20 ENCOUNTER — Ambulatory Visit (INDEPENDENT_AMBULATORY_CARE_PROVIDER_SITE_OTHER): Payer: No Typology Code available for payment source | Admitting: Family Medicine

## 2013-06-20 ENCOUNTER — Encounter (INDEPENDENT_AMBULATORY_CARE_PROVIDER_SITE_OTHER): Payer: Self-pay | Admitting: Family Medicine

## 2013-06-20 VITALS — BP 142/62 | HR 56 | Wt 205.0 lb

## 2013-06-20 DIAGNOSIS — J449 Chronic obstructive pulmonary disease, unspecified: Secondary | ICD-10-CM

## 2013-06-20 DIAGNOSIS — I10 Essential (primary) hypertension: Secondary | ICD-10-CM

## 2013-06-20 DIAGNOSIS — M545 Low back pain, unspecified: Secondary | ICD-10-CM

## 2013-06-20 DIAGNOSIS — M549 Dorsalgia, unspecified: Secondary | ICD-10-CM

## 2013-06-20 MED ORDER — HYDROMORPHONE HCL 4 MG OR TABS
ORAL_TABLET | ORAL | Status: DC
Start: 2013-06-20 — End: 2013-06-20

## 2013-06-20 MED ORDER — HYDROMORPHONE HCL 4 MG OR TABS
ORAL_TABLET | ORAL | Status: DC
Start: 2013-06-20 — End: 2013-07-18

## 2013-06-20 NOTE — Progress Notes (Signed)
S:  Tracy Conway is her for follow up.  When he was seen last time, I thought he had a pneumonia and prescribed him azithromycin which seemed to help within a few days.  Was seen by pulmonology recently who thought that it appeared that he had responded to treatment  He now has home O2--Wearing O2--uses this when he is out and about  Does not use it at the house too much  Does feel a whole better with it.  Was able to get out in the yard--spreading mosskill!    Depression--feeling better  Maybe due to O2?  Because he can do more--feels empowered  Still staying sober  Going everyday to [place] also to [facility]--only missed some sessions due to doctor's appts  56 days sobriety  Tracy Conway his wife thinks he is doing pretty well, but she is a bit guarded    Back pain is pretty bad lately--doing more standing, cooking quite a bit--this make the back pain worse    Just got a call from his sponsor while in the office  Great group of friends at AA--finds the group very supportive    Concerned about Melanoma--has lesion on his chest that is new    Bp at home--110's one teens/50's 60's    ROS:  No F, S, C.  No CP, no syncope  BMs reg    Patient Active Problem List    Diagnosis Date Noted   . CORONARY ARTERY DISEASE [410.90] 12/30/2010     Non-ST elevation myocardial infarction., July 18,2012  LAD drug-eluting stent nov, 2012. Clopidogrel  See Dr. Jari Conway, cardiology       . Malignant neoplasm of prostate [185] 08/14/2010     Diagnosed 07/2010. Sees Dr. Lissa Conway (urol) and Dr. Joneen Conway (rad onc). Saw Tracy Conway, Tracy Conway--[facility]--palladium seed implants June 2012  Tracy. Muffler has an intermediate risk prostate cancer on the basis of a Gleason 7 histology       . Lumbago [724.2] 12/19/2009     March 21, 2012, underwent L4-L5 bilateral laminectomies and partial medial facetectomies, as well as an L4-L5 right-sided synovial cyst resection. He says since surgery he feels better       . Routine general medical  examination at a health care facility [V70.0] 05/15/2009     06/07/2012  Td/Tdap (q 10 yr):  2004, 2012  Pneumococcal (65 yoa and high risk x1):  2010, 2004  Influenza vaccine (q 1 yr):  2010 nov, 2011, 2012, 2013  Shingles: discussed--should get at his local pharmacy  Blood pressure:  Excellent 138/64  121/72  Cholesterol (q 5 yr begin age 21):on simvastatin oct 2012: excellent--will repeat  AAA screen (x1, male age 72, if ever smoked):  Checked at [facility]-- abdo and thoracic CT--aug 2012: stable ascending aortic aneurysm  Colorectal cancer: ct colonoscopy done 2008, repeat due 2013--done April 2013: neg   PSA:  Sees urology for fu prostate ca. Has had palladium seed implants  Tobacco:  NS x 11 yrs, 50 pack yr hx  Exercise:  Reg--treadmill x 20-30 mins then weight machines--every other day! Has not been back to the gym because of the back pain  Alcohol /Drugs:  None in 26 yrs--relapse jan 2014--did inpatient treatment--now none in 37 days!  Sexual practice/contraception:  Not an issue  Advanced directives:  Does have living will. DPOA  done       . GENERAL OSTEOARTHROSIS [715.00] 01/07/2007     Has seen orthopedics--cortisone shot to  L knee helped.  L knee replacement 2008     . DIVERTICULITIS OF COLON W/O BLEED [562.11] 01/07/2007     Hospitalized at [facility] May 2008     . TOBACCO USE DISORDER [305.1] 12/24/2005     Quit 2000.  50 pack year history  EtOH--quit 1986.     Marland Kitchen HEARING LOSS NOS [389.9] 12/24/2005     Bilat hearing aids.     . ECZEMA  [692.9] 12/24/2005     Lower extremities in particular.     Marland Kitchen ERECTILE DYSFUNCTION [607.84] 12/24/2005   . BENIGN NEOPLASM LG BOWEL [211.3] 12/24/2005     Tubular adenoma. Dx'ed flex sig 2000--> colonoscopy 2000: N.  F/U 2003: mild diverticulosis, tubular adenoma.  F/U due 2008 with CT colonography.--done 7/08: ess. N.  Next due 2013       . CALCULUS OF KIDNEY [592.0] 12/24/2005     Lithotripsy 1988  Stone panel in 0ct 09: high oxalate. Given low oxalae diet by Dr. Princella Conway,  nephrology in 2009.       Marland Kitchen COPD [496] 12/24/2005     05/2013: started by pulm on home O2  Emphysema: an FEV1 which is approximately at 33% of predicted    04/2010--Pulm: Chronic obstructive pulmonary disease, severe to very severe. I explained to him that continuing on the Spiriva as well as the Serevent was likely good therapy.I further told him that if he should have recurrent exacerbations within the course of the year that I would consider adding an inhaled corticosteroid at that point in time. He does not need oxygen at this point in time. He remains a nonsmoker. He is already engaged in a pulmonary rehabilitation.    Today I described to him how overall lung volume reduction surgery had no significant benefit on mortality. Subgroup analysis did indicate that those patients, who had upper lobe predominant disease with low exercise capacity were the most likely to benefit. Given the fact that he actually still has reasonably good functional status and is getting around and going to the gym and working out, it is not clear that he would be in the group that would most likely benefit, although, to safe for certain, we need to get a cardiopulmonary exercise test. At this point in time, he is feeling good with his chronic obstructive pulmonary disease therapy and is not interested in pursuing lung volume reduction surgery.    05/20/2010 CT angio: Addendum: Increased scarring in the right lung base since the prior   exam including the interval development of a new nodule. The   appearance suggests that the patient has had a recent infection of   the right lower lobe has resolved and since the prior scan. However   recommend 3 months followup of the right lower lobe lesion given that   the patient is high risk with extensive paraseptal emphysema.     08/03/11 CT Chest  1. New indeterminate nodules in the lower lobes bilaterally, the largest   nodule measures 10 x 12 mm in the paraspinal location in the right lower  lobe.  Possible considerations include infectious/inflammatory and less likely   metastasis.   2. Bilateral upper lobe predominant moderate centrilobular and paraseptal   emphysema.   3. Unchanged dilated main pulmonary artery measuring 32 mm may indicate   pulmonary arterial hypertension.   4. Aortic aneurysm involving the ascending aorta at the level of the main   pulmonary artery measuring 47 mm.                  Marland Kitchen  Thoracic aneurysm without mention of rupture [441.2] 12/24/2005     Ascending aortic aneurysm.  Followed by Dr. Ralene Cork, Cardiac surgery.  Gets regular CT scans.  Goal systolic BP: [id number]  Also seeing Dr. Jari Conway cardiology  Also has AAA     . Abdominal aneurysm without mention of rupture [441.4] 12/24/2005     Father also had AAA. Borderline, seen on CT 8/06.  Also has thoracic aneurysm     . VENTRICULAR SEPT DEFECT [745.4] 12/24/2005     Small muscular VSD--NEEDS SBE PROPHYLAXIS. (Clindamycin 600 mg prior to dental visits.)     . HYPERTROPHY PROSTATE W/O OBST [600.00] 12/24/2005   . Chronic Kidney Disease -- Stage III [588.89] 07/22/2005     Creat ~ 1.8. Normal albuminuria (8/08)  Likely 2/2 HTN, poss component from hx obstructing nephrolithiasis.  8/08 Renal: referred to Uro for 44mm stone, nonobstructing. Also doing w/u for stone type, poss target prevention.     Marland Kitchen HYPERTENSION NOS(aka HTN) [401.9] 03/29/2002   . PURE HYPERCHOLESTEROLEM [272.0] 03/29/2002   . 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 History of non-ST elevation myocardial infarction (NSTEMI) [412]      TX FROM [facility]       . History of alcohol dependence [303.93]      TX FROM [facility]       . Carotid stenosis [433.10]      TX FROM [facility]       . Depression [311] 11/10/2012   . Lumbar stenosis with neurogenic claudication [724.03] 02/05/2012     March 21, 2012, underwent L4-L5 bilateral laminectomies and  partial medial facetectomies, as well as an L4-L5 right-sided synovial cyst resection. He says since surgery he feels better         Current Outpatient Prescriptions   Medication Sig Dispense Refill   . Albuterol Sulfate HFA 108 (90 BASE) MCG/ACT Inhalation Aero Soln Inhale 2 puffs by mouth every 4 hours as needed for shortness of breath/wheezing. For wheezing and/or cough.  1 Inhaler  2   . Aspirin 81 MG Oral Tab Once daily       . Benazepril HCl 20 MG Oral Tab Take 0.5 tablets (10 mg) by mouth daily. (Dose per discharge summary on 01/20/13)  45 tablet  3   . Citalopram Hydrobromide 20 MG Oral Tab 1 1/2 tabs po daily  135 tablet  3   . Fluticasone-Salmeterol (ADVAIR DISKUS) 250-50 MCG/DOSE Inhalation AEROSOL POWDER, BREATH ACTIVATED Inhale 1 puff by mouth 2 times a day.       . Gabapentin 100 MG Oral Cap Take 1 capsule (100 mg) by mouth at bedtime.  30 capsule  2   . HYDROmorphone HCl 4 MG Oral Tab 1 tablet(s) by mouth every 12 hours if needed to relieve pain. Max 60 tabs per month  60 tablet  0   . Metoprolol Succinate ER 100 MG Oral TABLET SR 24 HR Take 1 tablet (100 mg) by mouth daily. (Dose per discharge summary on 01/20/13)  90 tablet  3   . Multiple Vitamins-Minerals (MULTIVITAMIN OR) Once daily       . NIFEdipine ER 30 MG Oral TABLET SR 24 HR Take 1 tablet (30 mg) by mouth.       . Pantoprazole Sodium 40 MG Oral Pack 1 tab PO BID.       Marland Kitchen  Simvastatin 40 MG Oral Tab TAKE 1 TABLET DAILY IN THE EVENING FOR CHOLESTEROL  90 Tab  4   . Tamsulosin HCl 0.4 MG Oral Cap TAKE 1 CAPSULE BY MOUTH AT BEDTIME  90 capsule  2   . Tiotropium Bromide Monohydrate (SPIRIVA HANDIHALER) 18 MCG Inhalation Cap Inhale contents of 1 capsule (18 mcg) by mouth daily. (via handihaler)  90 capsule  0   . Triamcinolone Acetonide 0.1 % External Ointment Apply 1 application topically 2 times a day. Apply to affected areas bid  80 g  1     No current facility-administered medications for this visit.         O:  Pt alert and cooperative and in  NAD.  BP 142/62  Pulse 56  Wt 205 lb (92.987 kg)  BMI 28.6 kg/m2  SpO2 97%  Lungs sound clear bilat!  Exam of the skin reveals what appears to be a seb K on his anterior chest  BP a bit elevated here today though he follows this rgularly at home and his BP appears to be good there.    A/P:  (724.5) Back pain  (primary encounter diagnosis)  Plan: HYDROmorphone HCl 4 MG Oral Tab, DISCONTINUED:         HYDROmorphone HCl 4 MG Oral Tab        We spent quite a bit of time discussing the use of chronic narcotics.  It has been my hope that he might be able to get off these meds.  As it seems like he will need these more long term, then I will have him sign a pain contract on his next visit.  We discussed the problems of tolerance and addiction as well as other deleterious side effects    COPD: Doing better with home O2    Depression: stable, still on citalopram 30 mg daily. May be improved with the O2    Etoh abuse: doing very well with abstention!    rtc one month.    Levora Angel, MD

## 2013-07-07 ENCOUNTER — Ambulatory Visit (INDEPENDENT_AMBULATORY_CARE_PROVIDER_SITE_OTHER): Payer: No Typology Code available for payment source | Admitting: Emergency Medicine

## 2013-07-07 ENCOUNTER — Encounter (INDEPENDENT_AMBULATORY_CARE_PROVIDER_SITE_OTHER): Payer: Self-pay | Admitting: Emergency Medicine

## 2013-07-07 VITALS — BP 103/55 | HR 65 | Temp 101.2°F | Resp 16

## 2013-07-07 DIAGNOSIS — J029 Acute pharyngitis, unspecified: Secondary | ICD-10-CM

## 2013-07-07 MED ORDER — GUAIFENESIN-CODEINE 100-10 MG/5ML OR SYRP
10.0000 mL | ORAL_SOLUTION | Freq: Every evening | ORAL | Status: DC
Start: 2013-07-07 — End: 2013-10-26

## 2013-07-07 MED ORDER — LEVOFLOXACIN 750 MG OR TABS
750.0000 mg | ORAL_TABLET | ORAL | Status: AC
Start: 2013-07-07 — End: 2013-07-14

## 2013-07-07 NOTE — Progress Notes (Signed)
Urgent Care Encounter    SUBJECTIVE:  Tracy Conway is a 79 year old  male presents for shortness of breath with cough and sore throat with loss of voice.  His wife has similar symptoms but are less severe.  Patient has multiple medical illnesses that complicate his respiratory infection.  Over-the-counter medications have not been very helpful neither have his standard prescription medications..              I personally reviewed and confirmed the ROS and past medical history in the record with the patient today.        ROS:   Constitutional: As noted in HPI above   Eyes: Negative    Ears, Nose, Mouth, Throat: As noted in HPI above   Cardiovascular: Negative    Respiratory: As noted in HPI above          OBJECTIVE:  Alert male in no acute distress  Blood pressure 103/55, pulse 65, temperature 101.2 F (38.4 C), temperature source Temporal, resp. rate 16, SpO2 90 %.         PHYSICAL EXAM:  General: healthy, alert, uncomfortable  Skin: Skin color, texture, turgor normal. No rashes or concerning lesions  Ears: External ears normal. Canals clear. TM's normal.  Nose:congested  Oropharynx: Lips, mucosa, and tongue normal. Teeth and gums normal., positive findings: moderate oropharyngeal erythema, posterior pharynx without erythema or drainage  Neck: supple. No adenopathy. Thyroid symmetric, normal size, without nodules and positive findings: moderate anterior cervical nodes  Lungs: expiratory wheezes and rhonchi throughout both lung fields  Heart: normal rate, regular rhythm and no murmurs, clicks, or gallops    ASSESSMENT/PLAN:  (462) Pharyngitis  (primary encounter diagnosis)  Patient's medical history makes the possibility of clinical pneumonia highly likely.  Plan: Levofloxacin 750 MG Oral Tab,         Guaifenesin-Codeine 100-10 MG/5ML Oral Syrup          Follow-up with primary care provider within the next week if symptoms fail to improve.

## 2013-07-18 ENCOUNTER — Encounter (INDEPENDENT_AMBULATORY_CARE_PROVIDER_SITE_OTHER): Payer: Self-pay | Admitting: Family Medicine

## 2013-07-18 ENCOUNTER — Ambulatory Visit (INDEPENDENT_AMBULATORY_CARE_PROVIDER_SITE_OTHER): Payer: No Typology Code available for payment source | Admitting: Family Medicine

## 2013-07-18 ENCOUNTER — Other Ambulatory Visit (HOSPITAL_BASED_OUTPATIENT_CLINIC_OR_DEPARTMENT_OTHER): Payer: Self-pay | Admitting: Cardiovascular Disease

## 2013-07-18 VITALS — BP 123/53 | HR 56 | Temp 97.6°F | Wt 205.0 lb

## 2013-07-18 DIAGNOSIS — J449 Chronic obstructive pulmonary disease, unspecified: Secondary | ICD-10-CM

## 2013-07-18 DIAGNOSIS — R059 Cough, unspecified: Secondary | ICD-10-CM

## 2013-07-18 DIAGNOSIS — I10 Essential (primary) hypertension: Secondary | ICD-10-CM

## 2013-07-18 DIAGNOSIS — M549 Dorsalgia, unspecified: Secondary | ICD-10-CM

## 2013-07-18 MED ORDER — AFEDITAB CR 30 MG OR TB24
EXTENDED_RELEASE_TABLET | ORAL | Status: DC
Start: 2013-07-18 — End: 2013-09-19

## 2013-07-18 MED ORDER — HYDROMORPHONE HCL 4 MG OR TABS
ORAL_TABLET | ORAL | Status: DC
Start: 2013-07-18 — End: 2013-08-17

## 2013-07-18 NOTE — Telephone Encounter (Signed)
05/09/2013 card visit: he is back on his 30 mg of long-acting nifedipine   Next appointment 09/05/2013

## 2013-07-18 NOTE — Patient Instructions (Signed)
You can use the albuterol every 4 hours if necessary. If yo need it more than this--go to the hospital!

## 2013-07-18 NOTE — Progress Notes (Signed)
S:  Tracy Conway is here for follow up and for refills of medications, particularly the pain medications.    Is doing really well with the O2--feels like it gives him more energy, he is able to do more things, and consequently he is happier  Still not drinking    Has had a cold for about 2 weeks  Got a cough syrup which helped a bit  Still has a wierd cough  No F, S, C now  Had some SOB--first weekend about 2 weeks ago--was very SOB  Has lots of sputum now-- white-- was yellow and bloody at first    They are flying to [facility] over [place] day weekend--will need letter to take with him for the airlines to say that he requires O2 on the flight    SH: New puppy--male maltese to go with the male--keep him active    Using advair regularly and albuterol --about 2 x per day    Pain: stable    Patient Active Problem List    Diagnosis Date Noted   . CORONARY ARTERY DISEASE [410.90] 12/30/2010     Non-ST elevation myocardial infarction., July 18,2012  LAD drug-eluting stent nov, 2012. Clopidogrel  See [name], cardiology       . Malignant neoplasm of prostate [185] 08/14/2010     Diagnosed 07/2010. Sees [name] (urol) and [name] (rad onc). Saw [name], [name]--[facility] [place] [facility]--palladium seed implants June 2012  Tracy. Carlon has an intermediate risk prostate cancer on the basis of a Gleason 7 histology       . Lumbago [724.2] 12/19/2009     March 21, 2012, underwent L4-L5 bilateral laminectomies and partial medial facetectomies, as well as an L4-L5 right-sided synovial cyst resection. He says since surgery he feels better       . Routine general medical examination at a health care facility [V70.0] 05/15/2009     06/07/2012  Td/Tdap (q 10 yr):  2004, 2012  Pneumococcal (65 yoa and high risk x1):  2010, 2004  Influenza vaccine (q 1 yr):  2010 nov, 2011, 2012, 2013  Shingles: discussed--should get at his local pharmacy  Blood pressure:  Excellent 138/64  121/72  Cholesterol (q 5 yr begin age  72):on simvastatin oct 2012: excellent--will repeat  AAA screen (x1, male age 24, if ever smoked):  Checked at [facility]-- abdo and thoracic CT--aug 2012: stable ascending aortic aneurysm  Colorectal cancer: ct colonoscopy done 2008, repeat due 2013--done April 2013: neg   PSA:  Sees urology for fu prostate ca. Has had palladium seed implants  Tobacco:  NS x 11 yrs, 50 pack yr hx  Exercise:  Reg--treadmill x 20-30 mins then weight machines--every other day! Has not been back to the gym because of the back pain  Alcohol /Drugs:  None in 26 yrs--relapse jan 2014--did inpatient treatment--now none in 37 days!  Sexual practice/contraception:  Not an issue  Advanced directives:  Does have living will. DPOA  done       . GENERAL OSTEOARTHROSIS [715.00] 01/07/2007     Has seen orthopedics--cortisone shot to L knee helped.  L knee replacement 2008     . DIVERTICULITIS OF COLON W/O BLEED [562.11] 01/07/2007     Hospitalized at [facility] May 2008     . TOBACCO USE DISORDER [305.1] 12/24/2005     Quit 2000.  50 pack year history  EtOH--quit 1986.     Marland Kitchen HEARING LOSS NOS [389.9] 12/24/2005  Bilat hearing aids.     . ECZEMA  [692.9] 12/24/2005     Lower extremities in particular.     Marland Kitchen ERECTILE DYSFUNCTION [607.84] 12/24/2005   . BENIGN NEOPLASM LG BOWEL [211.3] 12/24/2005     Tubular adenoma. Dx'ed flex sig 2000--> colonoscopy 2000: N.  F/U 2003: mild diverticulosis, tubular adenoma.  F/U due 2008 with CT colonography.--done 7/08: ess. N.  Next due 2013       . CALCULUS OF KIDNEY [592.0] 12/24/2005     Lithotripsy 1988  Stone panel in 0ct 09: high oxalate. Given low oxalae diet by Dr. Princella [name], nephrology in 2009.       Marland Kitchen COPD [496] 12/24/2005     05/2013: started by pulm on home O2  Emphysema: an FEV1 which is approximately at 33% of predicted    04/2010--Pulm: Chronic obstructive pulmonary disease, severe to very severe. I explained to him that continuing on the Spiriva as well as the Serevent was likely good therapy.I further told  him that if he should have recurrent exacerbations within the course of the year that I would consider adding an inhaled corticosteroid at that point in time. He does not need oxygen at this point in time. He remains a nonsmoker. He is already engaged in a pulmonary rehabilitation.    Today I described to him how overall lung volume reduction surgery had no significant benefit on mortality. Subgroup analysis did indicate that those patients, who had upper lobe predominant disease with low exercise capacity were the most likely to benefit. Given the fact that he actually still has reasonably good functional status and is getting around and going to the gym and working out, it is not clear that he would be in the group that would most likely benefit, although, to safe for certain, we need to get a cardiopulmonary exercise test. At this point in time, he is feeling good with his chronic obstructive pulmonary disease therapy and is not interested in pursuing lung volume reduction surgery.    05/20/2010 CT angio: Addendum: Increased scarring in the right lung base since the prior   exam including the interval development of a new nodule. The   appearance suggests that the patient has had a recent infection of   the right lower lobe has resolved and since the prior scan. However   recommend 3 months followup of the right lower lobe lesion given that   the patient is high risk with extensive paraseptal emphysema.     08/03/11 CT Chest  1. New indeterminate nodules in the lower lobes bilaterally, the largest   nodule measures 10 x 12 mm in the paraspinal location in the right lower lobe.  Possible considerations include infectious/inflammatory and less likely   metastasis.   2. Bilateral upper lobe predominant moderate centrilobular and paraseptal   emphysema.   3. Unchanged dilated main pulmonary artery measuring 32 mm may indicate   pulmonary arterial hypertension.   4. Aortic aneurysm involving the ascending aorta at the  level of the main   pulmonary artery measuring 47 mm.     10/2011:   Impression:  1. Previous opacities resolved. New focus of small airways inflammation in the  RUL. No worrisome nodule or opacity for malignancy. If followup of the new   airways inflammation is desired a 12 month study will be adequate.  2. Stable fibrosis and peripheral calcification in the RLL, which raises the   possibility of chronic aspiration.                  Marland Kitchen  Thoracic aneurysm without mention of rupture [441.2] 12/24/2005     Ascending aortic aneurysm.  Followed by Dr. Ralene Cork, Cardiac surgery.  Gets regular CT scans.  Goal systolic BP: [id number]  Also seeing [name] cardiology  Also has AAA     . Abdominal aneurysm without mention of rupture [441.4] 12/24/2005     Father also had AAA. Borderline, seen on CT 8/06.  Also has thoracic aneurysm     . VENTRICULAR SEPT DEFECT [745.4] 12/24/2005     Small muscular VSD--NEEDS SBE PROPHYLAXIS. (Clindamycin 600 mg prior to dental visits.)     . HYPERTROPHY PROSTATE W/O OBST [600.00] 12/24/2005   . Chronic Kidney Disease -- Stage III [588.89] 07/22/2005     Creat ~ 1.8. Normal albuminuria (8/08)  Likely 2/2 HTN, poss component from hx obstructing nephrolithiasis.  8/08 Renal: referred to Uro for 79mm stone, nonobstructing. Also doing w/u for stone type, poss target prevention.     Marland Kitchen HYPERTENSION NOS(aka HTN) [401.9] 03/29/2002   . PURE HYPERCHOLESTEROLEM [272.0] 03/29/2002   . 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 History of non-ST elevation myocardial infarction (NSTEMI) [412]      TX FROM [facility]       . History of alcohol dependence [303.93]      TX FROM [facility]       . Carotid stenosis [433.10]      TX FROM [facility]       . Depression [311] 11/10/2012   . Lumbar stenosis with neurogenic claudication [724.03] 02/05/2012     March 21, 2012, underwent L4-L5 bilateral  laminectomies and partial medial facetectomies, as well as an L4-L5 right-sided synovial cyst resection. He says since surgery he feels better         Outpatient Prescriptions Prior to Visit   Medication Sig Dispense Refill   . Albuterol Sulfate HFA 108 (90 BASE) MCG/ACT Inhalation Aero Soln Inhale 2 puffs by mouth every 4 hours as needed for shortness of breath/wheezing. For wheezing and/or cough.  1 Inhaler  2   . Aspirin 81 MG Oral Tab Once daily       . Benazepril HCl 20 MG Oral Tab Take 0.5 tablets (10 mg) by mouth daily. (Dose per discharge summary on 01/20/13)  45 tablet  3   . Citalopram Hydrobromide 20 MG Oral Tab 1 1/2 tabs po daily  135 tablet  3   . Fluticasone-Salmeterol (ADVAIR DISKUS) 250-50 MCG/DOSE Inhalation AEROSOL POWDER, BREATH ACTIVATED Inhale 1 puff by mouth 2 times a day.       . Gabapentin 100 MG Oral Cap Take 1 capsule (100 mg) by mouth at bedtime.  30 capsule  2   . Guaifenesin-Codeine 100-10 MG/5ML Oral Syrup Take 10 mL by mouth at bedtime.  120 mL  0   . HYDROmorphone HCl 4 MG Oral Tab 1 tablet(s) by mouth every 12 hours if needed to relieve pain. Max 60 tabs per month  60 tablet  0   . Metoprolol Succinate ER 100 MG Oral TABLET SR 24 HR Take 1 tablet (100 mg) by mouth daily. (Dose per discharge summary on 01/20/13)  90 tablet  3   . Multiple Vitamins-Minerals (MULTIVITAMIN OR) Once daily       . NIFEdipine ER 30 MG Oral TABLET SR 24 HR Take 1 tablet (30 mg) by mouth.       Marland Kitchen  Pantoprazole Sodium 40 MG Oral Pack 1 tab PO BID.       Marland Kitchen Simvastatin 40 MG Oral Tab TAKE 1 TABLET DAILY IN THE EVENING FOR CHOLESTEROL  90 Tab  4   . Tamsulosin HCl 0.4 MG Oral Cap TAKE 1 CAPSULE BY MOUTH AT BEDTIME  90 capsule  2   . Tiotropium Bromide Monohydrate (SPIRIVA HANDIHALER) 18 MCG Inhalation Cap Inhale contents of 1 capsule (18 mcg) by mouth daily. (via handihaler)  90 capsule  0   . Triamcinolone Acetonide 0.1 % External Ointment Apply 1 application topically 2 times a day. Apply to affected areas bid   80 g  1     No facility-administered medications prior to visit.           O:  Pt alert and cooperative and in NAD.  BP 123/53   Pulse 56   Temp(Src) 97.6 F (36.4 C) (Temporal)   Wt 205 lb (92.987 kg)   SpO2 96%   Chest: mildly decreased AE bilat though no crackles no wheezes heard today    CXR: pneumonia looks improved from the last CXR    A/P:  (724.5) Back pain  (primary encounter diagnosis)  Plan: HYDROmorphone HCl 4 MG Oral Tab        Will refill his meds today.  Time did not permit [place] to go over the pain contract--we will do that next time    (786.2) Cough  Plan: X-RAY CHEST 2 VW        i think that the current cough may be a viral illness from which he is slowly recovering. Will observe for now.  CXR seems to confirm that the pneumonia that we treated him for recently is resolving  i do not think he needs antibiotics at this point  He can use the albuterol more frequently for symptoms      COPD  Will write him a letter for the airlines to allow him to take the O2 with him    RTC one month.    Deitra Mayo Kairi Harshbarger

## 2013-07-26 ENCOUNTER — Encounter (INDEPENDENT_AMBULATORY_CARE_PROVIDER_SITE_OTHER): Payer: Self-pay | Admitting: Family Medicine

## 2013-08-07 ENCOUNTER — Telehealth (INDEPENDENT_AMBULATORY_CARE_PROVIDER_SITE_OTHER): Payer: Self-pay | Admitting: Family Medicine

## 2013-08-07 NOTE — Telephone Encounter (Signed)
We received labs from [facility]. I placed them in your in box

## 2013-08-09 ENCOUNTER — Other Ambulatory Visit (INDEPENDENT_AMBULATORY_CARE_PROVIDER_SITE_OTHER): Payer: Self-pay | Admitting: Family Medicine

## 2013-08-09 DIAGNOSIS — Z760 Encounter for issue of repeat prescription: Secondary | ICD-10-CM

## 2013-08-09 NOTE — Telephone Encounter (Signed)
Thank you :)

## 2013-08-10 MED ORDER — SPIRIVA HANDIHALER 18 MCG IN CAPS
ORAL_CAPSULE | RESPIRATORY_TRACT | Status: DC
Start: 2013-08-10 — End: 2013-09-19

## 2013-08-17 ENCOUNTER — Ambulatory Visit (INDEPENDENT_AMBULATORY_CARE_PROVIDER_SITE_OTHER): Payer: No Typology Code available for payment source | Admitting: Family Medicine

## 2013-08-17 ENCOUNTER — Encounter (INDEPENDENT_AMBULATORY_CARE_PROVIDER_SITE_OTHER): Payer: Self-pay | Admitting: Family Medicine

## 2013-08-17 VITALS — BP 99/52 | HR 55 | Temp 97.5°F | Resp 16 | Wt 204.6 lb

## 2013-08-17 DIAGNOSIS — M549 Dorsalgia, unspecified: Secondary | ICD-10-CM

## 2013-08-17 MED ORDER — HYDROMORPHONE HCL 4 MG OR TABS
ORAL_TABLET | ORAL | Status: DC
Start: 2013-08-17 — End: 2013-09-19

## 2013-08-17 NOTE — Patient Instructions (Signed)
Try using a cane and see if that will help the back pain

## 2013-08-17 NOTE — Progress Notes (Signed)
Chronic Pain Pre-visit Information    Urine test at 94 temperature.

## 2013-08-17 NOTE — Progress Notes (Signed)
S:    Pt is here for med refills and to follow up on his chronic pain.    He is improving to some degree-- he is back to the gym  Can tolerate 20 min on treadmill at 2 miles and hour  O2 dropping into high 80's even with the supplemental O2 with exertion  Back pain is bad with walking and standing  Wears llightweight running shoes  Does have Dr. Felicie Morn supports for his feet.  Better if he leans on grocery cart while walking through store  Pain is Better bending over.  Not clearly better with sitting  We reviewed his xrays--he had central stenosis for which he had surgery though there is no mention in MRIs or xrays of spinal stenosis  Last xrays of LS spine 05/2012 after his back surgery    still taking antidepressant and O2--feels better    New puppy is chewing! Requires constant attention  114 days of sobriety!    Thinking of taking computer classes at [facility]    O:  Pt alert and cooperative and in NAD.  BP 99/52   Pulse 55   Temp(Src) 97.5 F (36.4 C) (Temporal)   Resp 16   Wt 204 lb 9.6 oz (92.806 kg)   SpO2 95%     A/P:  (724.5) Back pain  (primary encounter diagnosis)  Plan: HYDROmorphone HCl 4 MG Oral Tab        Pain meds refilled  We also had him sign a pain contract today  He will need to rtc prior to his trip to [place] over memorial day weekend.    Keep up the exercise  Trying a cane may be helpful to reduce the back pain while he is walking. Not clear which hand to use it so he will experiment.

## 2013-09-05 ENCOUNTER — Ambulatory Visit
Payer: No Typology Code available for payment source | Attending: Cardiovascular Disease | Admitting: Cardiovascular Disease

## 2013-09-05 VITALS — BP 114/62 | HR 64 | Ht 71.0 in | Wt 196.0 lb

## 2013-09-05 DIAGNOSIS — J4489 Other specified chronic obstructive pulmonary disease: Secondary | ICD-10-CM | POA: Insufficient documentation

## 2013-09-05 DIAGNOSIS — E7800 Pure hypercholesterolemia, unspecified: Secondary | ICD-10-CM | POA: Insufficient documentation

## 2013-09-05 DIAGNOSIS — N2589 Other disorders resulting from impaired renal tubular function: Secondary | ICD-10-CM | POA: Insufficient documentation

## 2013-09-05 DIAGNOSIS — I219 Acute myocardial infarction, unspecified: Secondary | ICD-10-CM | POA: Insufficient documentation

## 2013-09-05 DIAGNOSIS — I10 Essential (primary) hypertension: Secondary | ICD-10-CM | POA: Insufficient documentation

## 2013-09-05 DIAGNOSIS — J449 Chronic obstructive pulmonary disease, unspecified: Secondary | ICD-10-CM | POA: Insufficient documentation

## 2013-09-06 ENCOUNTER — Telehealth (HOSPITAL_BASED_OUTPATIENT_CLINIC_OR_DEPARTMENT_OTHER): Payer: Self-pay | Admitting: Cardiovascular Disease

## 2013-09-06 NOTE — Telephone Encounter (Signed)
Tracy Conway requests refills for medications.    Agreed to follow up with PCP and contact cardiology for cardiac medication refills when his insurance changes in June.

## 2013-09-07 ENCOUNTER — Encounter (INDEPENDENT_AMBULATORY_CARE_PROVIDER_SITE_OTHER): Payer: Self-pay | Admitting: Family Medicine

## 2013-09-07 NOTE — Progress Notes (Signed)
KHING, BELCHER          [id number]          09/05/2013      CARDIOLOGY CLINIC NOTE     [name] since last seen by [place] last January, has had episodes off breathing difficulties generally associated with having a cold. He was found to have exertional hypoxemia and was started on continuous oxygen, which he is now using almost 24 hours a day.  He said since being on the oxygen he is somewhat less short of breath.  He certainly has less phlegm since starting it. He uses his CPAP at nighttime with the oxygen running.  He has no chest pain.  He now walks about 20 minutes at 1.8 miles per hour on the treadmill with the oxygen running. If he does not use oxygen, he can only walk on the treadmill for at most 5 minutes.  He still has intermittent back pain, but he says it is bearable.  He has had no alcohol since last December and he still faithfully attends AA meetings every night.    PHYSICAL EXAMINATION     VITAL SIGNS:  Blood pressure 114/62 sitting, 110/64 standing, pulse 64 per minute, weight 196 pounds, which is down 7 pounds  since 4 months ago.  NECK:  No increased systemic venous pressure.  Grade 1 systolic carotid bruits bilaterally.  LUNGS:  Clear.  No rales heard.  HEART:  With a regular rhythm.  There is a grade 1-2 aortic systolic ejection murmur.  No gallops, heaves or percussive cardiomegaly.  ABDOMEN:  Obese, no masses.  EXTREMITIES:  No peripheral edema.  Good pedal pulses.    ASSESSMENT AND PLAN     We are going to get some lab work the next time he sees [place].  From the cardiac standpoint he certainly is stable.  He is still taking pantoprazole 40 mg twice a day and I suggested he cut it down to 40 mg once a day and he is to stay on the other medications as outlined.

## 2013-09-08 ENCOUNTER — Encounter (INDEPENDENT_AMBULATORY_CARE_PROVIDER_SITE_OTHER): Payer: Self-pay | Admitting: Family Medicine

## 2013-09-19 ENCOUNTER — Ambulatory Visit (INDEPENDENT_AMBULATORY_CARE_PROVIDER_SITE_OTHER): Payer: No Typology Code available for payment source | Admitting: Family Medicine

## 2013-09-19 ENCOUNTER — Encounter (INDEPENDENT_AMBULATORY_CARE_PROVIDER_SITE_OTHER): Payer: Self-pay | Admitting: Family Medicine

## 2013-09-19 VITALS — BP 159/78 | HR 52 | Temp 98.6°F | Resp 20 | Wt 200.0 lb

## 2013-09-19 DIAGNOSIS — J449 Chronic obstructive pulmonary disease, unspecified: Secondary | ICD-10-CM

## 2013-09-19 DIAGNOSIS — F3289 Other specified depressive episodes: Secondary | ICD-10-CM

## 2013-09-19 DIAGNOSIS — N2589 Other disorders resulting from impaired renal tubular function: Secondary | ICD-10-CM

## 2013-09-19 DIAGNOSIS — R059 Cough, unspecified: Secondary | ICD-10-CM

## 2013-09-19 DIAGNOSIS — E7800 Pure hypercholesterolemia, unspecified: Secondary | ICD-10-CM

## 2013-09-19 DIAGNOSIS — I219 Acute myocardial infarction, unspecified: Secondary | ICD-10-CM

## 2013-09-19 DIAGNOSIS — M549 Dorsalgia, unspecified: Secondary | ICD-10-CM

## 2013-09-19 DIAGNOSIS — I10 Essential (primary) hypertension: Secondary | ICD-10-CM

## 2013-09-19 DIAGNOSIS — E785 Hyperlipidemia, unspecified: Secondary | ICD-10-CM

## 2013-09-19 DIAGNOSIS — F32A Depression, unspecified: Secondary | ICD-10-CM

## 2013-09-19 DIAGNOSIS — F329 Major depressive disorder, single episode, unspecified: Secondary | ICD-10-CM

## 2013-09-19 DIAGNOSIS — K219 Gastro-esophageal reflux disease without esophagitis: Secondary | ICD-10-CM

## 2013-09-19 MED ORDER — FLUTICASONE-SALMETEROL 250-50 MCG/ACT IN AEPB
1.0000 | INHALATION_SPRAY | Freq: Two times a day (BID) | RESPIRATORY_TRACT | Status: DC
Start: 2013-09-19 — End: 2013-10-30

## 2013-09-19 MED ORDER — GABAPENTIN 100 MG OR CAPS
100.0000 mg | ORAL_CAPSULE | Freq: Every evening | ORAL | Status: DC
Start: 2013-09-19 — End: 2014-01-25

## 2013-09-19 MED ORDER — HYDROMORPHONE HCL 4 MG OR TABS
ORAL_TABLET | ORAL | Status: DC
Start: 2013-09-19 — End: 2013-09-19

## 2013-09-19 MED ORDER — TIOTROPIUM BROMIDE MONOHYDRATE 18 MCG IN CAPS
18.0000 ug | ORAL_CAPSULE | Freq: Every day | RESPIRATORY_TRACT | Status: DC
Start: 2013-09-19 — End: 2014-02-28

## 2013-09-19 MED ORDER — TAMSULOSIN HCL 0.4 MG OR CAPS
0.4000 mg | ORAL_CAPSULE | Freq: Every evening | ORAL | Status: DC
Start: 2013-09-19 — End: 2014-09-06

## 2013-09-19 MED ORDER — CITALOPRAM HYDROBROMIDE 20 MG OR TABS
ORAL_TABLET | ORAL | Status: DC
Start: 2013-09-19 — End: 2013-10-19

## 2013-09-19 MED ORDER — METOPROLOL SUCCINATE ER 100 MG OR TB24
100.0000 mg | EXTENDED_RELEASE_TABLET | Freq: Every day | ORAL | Status: DC
Start: 2013-09-19 — End: 2014-09-06

## 2013-09-19 MED ORDER — HYDROMORPHONE HCL 4 MG OR TABS
ORAL_TABLET | ORAL | Status: DC
Start: 2013-10-19 — End: 2013-11-13

## 2013-09-19 MED ORDER — NIFEDIPINE ER 30 MG OR TB24
30.0000 mg | EXTENDED_RELEASE_TABLET | Freq: Every day | ORAL | Status: DC
Start: 2013-09-19 — End: 2013-10-26

## 2013-09-19 MED ORDER — ALBUTEROL SULFATE HFA 108 (90 BASE) MCG/ACT IN AERS
2.0000 | INHALATION_SPRAY | RESPIRATORY_TRACT | Status: DC | PRN
Start: 2013-09-19 — End: 2014-09-06

## 2013-09-19 MED ORDER — PANTOPRAZOLE SODIUM 40 MG OR TBEC
40.0000 mg | DELAYED_RELEASE_TABLET | Freq: Every morning | ORAL | Status: DC
Start: 2013-09-19 — End: 2014-09-06

## 2013-09-19 MED ORDER — BENAZEPRIL HCL 20 MG OR TABS
20.0000 mg | ORAL_TABLET | Freq: Every day | ORAL | Status: DC
Start: 2013-09-19 — End: 2014-08-06

## 2013-09-19 MED ORDER — SIMVASTATIN 40 MG OR TABS
40.0000 mg | ORAL_TABLET | Freq: Every evening | ORAL | Status: DC
Start: 2013-09-19 — End: 2014-09-06

## 2013-09-19 NOTE — Progress Notes (Signed)
Urine Temp: 96F

## 2013-09-19 NOTE — Progress Notes (Signed)
S:  Pt is primarily here for refill of his pain meds.  Ran out of pain meds last weekend--managed without though was hard to be in car on his trip to [place] on the plane.  Pain is stable    Re O2:  Has been Sleeping with cpap and O2--seemed to be causing restless sleep  Better if he just uses the O2--that is what he is planning on doing now, no using the CPAP      Concerned about his memory--seems to come and go  Sometimes remembers much detail about events, tasks to be done, sometimes does not  Sometimes forgets the mundane things--like what he came into a room to do  Long term memory is great.    O:  Pt alert and cooperative and in NAD.  BP 159/78  Pulse 52  Temp(Src) 98.6 F (37 C) (Temporal)  Resp 20  Wt 200 lb (90.719 kg)  SpO2 96%     A/P:  1) chronic back pain  Refilled pain meds    2) discussed O2 needs  He will try sleeping just with supplemental O2  i noticed that his BP seems to be trending upwards--need to watch this.  If persists, this may be due to worsening sleep apnea--might be best if he goes back on CPAP--will observe    3) memory concerns  Discussed this in some detail.  Unfortunately our treatments for memory failure are not great.  Discussed avoidance of things that make this worse: etoh, poor sleep, lack of exercise  Will observe  Can consider doing MoCA test    i refilled most of his medications as he will be changing insurance and will need written prescriptions for a year to be mailed out    rtc two months as I will be away in one month's time.    Levora Angel, MD

## 2013-09-19 NOTE — Telephone Encounter (Signed)
i refilled all his meds on his visit today.

## 2013-10-16 ENCOUNTER — Telehealth (INDEPENDENT_AMBULATORY_CARE_PROVIDER_SITE_OTHER): Payer: Self-pay | Admitting: Family Medicine

## 2013-10-16 NOTE — Telephone Encounter (Signed)
Received a fax from [place] stating the Rx orders that were faxed didn't meet there state requirements. They attached that original Rx's to the fax. Placing faxes in PCP's inbox. Please fill out the 2 fax forms ASAP. Routing to PCP.

## 2013-10-17 NOTE — Telephone Encounter (Signed)
Received signed orders. Faxing to [facility] at [phone]. Fax was successful.

## 2013-10-17 NOTE — Telephone Encounter (Signed)
Done thanks.  Signed orders placed in the to be faxed basket.ct

## 2013-10-18 ENCOUNTER — Other Ambulatory Visit: Payer: Self-pay | Admitting: Family Medicine

## 2013-10-18 DIAGNOSIS — F32A Depression, unspecified: Secondary | ICD-10-CM

## 2013-10-18 NOTE — Telephone Encounter (Addendum)
[facility] is requesting 90 day supply for Citalopram     Comment: pt is requesting or taking Citalopram at a dose greater than 20mg  daily and is 77 years of age or older.                    please send new Rx

## 2013-10-19 MED ORDER — CITALOPRAM HYDROBROMIDE 20 MG OR TABS
30.0000 mg | ORAL_TABLET | Freq: Every day | ORAL | Status: DC
Start: 2013-10-19 — End: 2013-11-13

## 2013-10-23 ENCOUNTER — Telehealth (INDEPENDENT_AMBULATORY_CARE_PROVIDER_SITE_OTHER): Payer: Self-pay | Admitting: Family Medicine

## 2013-10-23 ENCOUNTER — Encounter (INDEPENDENT_AMBULATORY_CARE_PROVIDER_SITE_OTHER): Payer: Self-pay | Admitting: Family Medicine

## 2013-10-23 NOTE — Telephone Encounter (Signed)
Thank you. This is Dr. Mellissa Kohut patient, and will let her review and make decision since patient has been on this dose for roughly 6 months.

## 2013-10-23 NOTE — Telephone Encounter (Signed)
E-care message sent in [name] chart for Tracy Conway:     Dr. Satira Sark, I know we've discussed this before, but I am communicating now with a greater sense of urgency. About 2 weeks ago, [name] fell when he got out of my friends car. Thus the resulting black eye, not to mention his glasses that he broke in the fall. He was lucky that someone was there. See first photo attached.    Yesterday, while I was gone, Tracy Conway fell again, and when he fell he landed flat on his face and knocked out his front tooth and skinned up his lip and nose pretty good. He did not lose consciousness, and thank goodness for the kindness of strangers who helped get him home. See the last 3 photo's attached.    Now today, he fell on the stairs in the house while I was out working in the yard. This time he did not appear to have hurt himself, but we'll have to see what new bruises show up.    I don't know where to turn or what to do, and I am scared to death about what might happen when I'm not home (at work), or what I might find when I get home.    I have nagged, and nagged, and nagged about his blood pressure and tried to get him to check his blood pressure consistently. He'll do it for a while and then he quits. His blood pressure is so irregular and inconsistent. He gets dizzy a lot and I have had to grab him to stabalize him many times.     The [place] has a durable power of attorney on file that permits me to talk with the doctors about his medical care and conditions.    There has got to be something that can be done. Every time he goes to see the heart doctor he just says his blood pressure looks good and sends him on his way.     FYI - He's aware that I'm emailing you.

## 2013-10-23 NOTE — Telephone Encounter (Signed)
Received a fax from [facility] requesting PCP to review the dosage for Citalopram since pt is over the age of 71. The medication as already been refilled on 10/19/13. Placed fax in PCP's inbox to review and sign. Routing to PCP.

## 2013-10-24 ENCOUNTER — Telehealth (HOSPITAL_BASED_OUTPATIENT_CLINIC_OR_DEPARTMENT_OTHER): Payer: Self-pay | Admitting: Orthopaedic Surgery

## 2013-10-24 ENCOUNTER — Encounter (INDEPENDENT_AMBULATORY_CARE_PROVIDER_SITE_OTHER): Payer: Self-pay | Admitting: Family Medicine

## 2013-10-24 DIAGNOSIS — J439 Emphysema, unspecified: Secondary | ICD-10-CM

## 2013-10-24 DIAGNOSIS — J449 Chronic obstructive pulmonary disease, unspecified: Secondary | ICD-10-CM

## 2013-10-24 NOTE — Telephone Encounter (Signed)
CONFIRMED PHONE NUMBER: [phone]  CALLERS FIRST AND LAST NAME: Elmo Putt  FACILITY NAME: [facility] TITLE: Glass blower/designer  CALLERS RELATIONSHIP:OTHER: Development worker, community  RETURN CALL: OK to leave detailed message with anyone that answers     SUBJECT: General Message   REASON FOR REQUEST: medication question    MESSAGE: Does knee replacement patient still need to take medication prior to having any dental work done? Please fax letter with recommendation to [phone].

## 2013-10-24 NOTE — Telephone Encounter (Signed)
Dr Catalina Gravel is at [facility]. Thanks.

## 2013-10-25 ENCOUNTER — Encounter (INDEPENDENT_AMBULATORY_CARE_PROVIDER_SITE_OTHER): Payer: Self-pay | Admitting: Orthopaedic Surgery

## 2013-10-25 NOTE — Telephone Encounter (Signed)
Routing message to appropriate clinic. Please see below message.

## 2013-10-25 NOTE — Telephone Encounter (Signed)
Pod 1: Look for paperwork to give to covering provider for Advair disks. It looks like the citalopram has been addressed by Dr. Bobbye Charleston.

## 2013-10-25 NOTE — Telephone Encounter (Signed)
Letter faxed to the number provided below regarding lifelong prophylactic antibiotic treatment and recommendations after joint replacements.

## 2013-10-26 ENCOUNTER — Telehealth (INDEPENDENT_AMBULATORY_CARE_PROVIDER_SITE_OTHER): Payer: Self-pay | Admitting: Family Medicine

## 2013-10-26 ENCOUNTER — Ambulatory Visit (INDEPENDENT_AMBULATORY_CARE_PROVIDER_SITE_OTHER): Payer: No Typology Code available for payment source | Admitting: Family Medicine

## 2013-10-26 VITALS — BP 107/55 | HR 58 | Temp 98.6°F | Resp 24 | Wt 204.6 lb

## 2013-10-26 DIAGNOSIS — I951 Orthostatic hypotension: Secondary | ICD-10-CM

## 2013-10-26 DIAGNOSIS — W19XXXA Unspecified fall, initial encounter: Secondary | ICD-10-CM

## 2013-10-26 LAB — COMPREHENSIVE METABOLIC PANEL
ALT (GPT): 12 U/L (ref 10–48)
AST (GOT): 20 U/L (ref 9–38)
Albumin: 4.2 g/dL (ref 3.5–5.2)
Alkaline Phosphatase (Total): 50 U/L — ABNORMAL LOW (ref 52–227)
Anion Gap: 10 (ref 4–12)
Bilirubin (Total): 0.5 mg/dL (ref 0.2–1.3)
Calcium: 9.7 mg/dL (ref 8.9–10.2)
Carbon Dioxide, Total: 25 mEq/L (ref 22–32)
Chloride: 103 mEq/L (ref 98–108)
Creatinine: 1.7 mg/dL — ABNORMAL HIGH (ref 0.51–1.18)
GFR, Calc, African American: 48 mL/min — ABNORMAL LOW (ref >59)
GFR, Calc, European American: 39 mL/min — ABNORMAL LOW (ref >59)
Glucose: 94 mg/dL (ref 62–125)
Potassium: 4.4 mEq/L (ref 3.6–5.2)
Protein (Total): 6.9 g/dL (ref 6.0–8.2)
Sodium: 138 mEq/L (ref 135–145)
Urea Nitrogen: 39 mg/dL — ABNORMAL HIGH (ref 8–21)

## 2013-10-26 LAB — CBC (HEMOGRAM)
Hematocrit: 38 % (ref 38–50)
Hemoglobin: 12.5 g/dL — ABNORMAL LOW (ref 13.0–18.0)
MCH: 30.3 pg (ref 27.3–33.6)
MCHC: 33.2 g/dL (ref 32.2–36.5)
MCV: 91 fL (ref 81–98)
Platelet Count: 217 10*3/uL (ref 150–400)
RBC: 4.13 mil/uL — ABNORMAL LOW (ref 4.40–5.60)
RDW-CV: 15.8 % — ABNORMAL HIGH (ref 11.6–14.4)
WBC: 6.55 10*3/uL (ref 4.3–10.0)

## 2013-10-26 NOTE — Telephone Encounter (Signed)
Patient saw Dr. Bobbye Charleston today and wants him to see you, but you have no openings and he wants to know if we can overbook him into your July 14th schedule. You can eCare or call him.

## 2013-10-26 NOTE — Progress Notes (Signed)
SUBJECTIVE: Tracy Conway is a 79 year old male here to discuss the following:    (E888.9) Fall, initial encounter  (primary encounter diagnosis)  (458.0) Orthostatic hypotension  Two weeks ago had house guest, stopped at store, got out of the car, fell and hit L eye area on concrete curb.   Friend said legs went out from under her.  Doesn't remember falling, but remembers hitting sidewalk.  Doesn't remember dizziness.    5-6 days ago had another fall. Walking very quickly after a lost dog, more vigorously than usual since he has COPD. Started to run, took 2 steps and fell. Feels that he tripped over own feet at that time. However, also felt very winded.  Hit sidewalk with face, tooth knocked out. No LOC.  No dizziness.    Last year also had a couple of falls due to lightheadedness.  In hospital overnight for a fall in September 2015. Finished urinating in the morning, turned around and fell.  Doesn't remember the fall, but remembers impact. Thought to have micturition syncope.  Asked to stop tamsulosin, and did so for a month, then restarted because of urinary difficulties.    Notes that he attends AA meetings every night and 9 out of 10 nights when he stands at the end of the meeting, he experiences lightheadedness. Supports self. Goes away within 10-15 seconds.    Meds include three meds that affect BP -- benazepril, metoprolol (also given for thoracic aortic aneurysm), and nifedipine.    Takes hydrocodone  2-3 times per week rather than daily. No clear association historically with falls.     No palpitations, chest pain.  MI two years ago. Has stents in place.  Sees [name] every 3 months.  Some sight difficulties, told that he is getting cataracts.  No double vision.  No trouble speaking.  No neuro problems.  No hx stroke.  No vertigo or dizziness when sitting.  When sitting for a long period of time.    Outpatient Prescriptions Prior to Visit   Medication Sig Dispense Refill   . Albuterol Sulfate HFA  108 (90 BASE) MCG/ACT Inhalation Aero Soln Inhale 2 puffs by mouth every 4 hours as needed for shortness of breath/wheezing. For wheezing and/or cough. 1 Inhaler 3   . Aspirin 81 MG Oral Tab Once daily     . Benazepril HCl 20 MG Oral Tab Take 1 tablet (20 mg) by mouth daily. 90 tablet 3   . Citalopram Hydrobromide 20 MG Oral Tab Take 1.5 tablets (30 mg) by mouth daily. 135 tablet 3   . Fluticasone-Salmeterol (ADVAIR DISKUS) 250-50 MCG/DOSE Inhalation AEROSOL POWDER, BREATH ACTIVATED Inhale 1 puff by mouth 2 times a day. 3 Inhaler 3   . Gabapentin 100 MG Oral Cap Take 1 capsule (100 mg) by mouth at bedtime. 90 capsule 3   . Guaifenesin-Codeine 100-10 MG/5ML Oral Syrup Take 10 mL by mouth at bedtime. 120 mL 0   . HYDROmorphone HCl 4 MG Oral Tab 1 tablet(s) by mouth every 12 hours if needed to relieve pain. Max 60 tabs per month 60 tablet 0   . Metoprolol Succinate ER 100 MG Oral TABLET SR 24 HR Take 1 tablet (100 mg) by mouth daily. (Dose per discharge summary on 01/20/13) 90 tablet 3   . Multiple Vitamins-Minerals (MULTIVITAMIN OR) Once daily     . NIFEdipine ER (AFEDITAB CR) 30 MG Oral TABLET SR 24 HR Take 1 tablet (30 mg) by mouth daily. 90 tablet 3   .  Pantoprazole Sodium 40 MG Oral Pack 1 tab PO BID.     Marland Kitchen Pantoprazole Sodium 40 MG Oral Tab EC Take 1 tablet (40 mg) by mouth every morning. 90 tablet 3   . Simvastatin 40 MG Oral Tab Take 1 tablet (40 mg) by mouth every evening. For cholesterol 90 tablet 3   . Tamsulosin HCl 0.4 MG Oral Cap Take 1 capsule (0.4 mg) by mouth at bedtime. 90 capsule 3   . Tiotropium Bromide Monohydrate (SPIRIVA HANDIHALER) 18 MCG Inhalation Cap Inhale contents of 1 capsule (18 mcg) by mouth daily. Via handihaler 90 capsule 3   . Triamcinolone Acetonide 0.1 % External Ointment Apply 1 application topically 2 times a day. Apply to affected areas bid 80 g 1     No facility-administered medications prior to visit.       OBJECTIVE:  BP 107/55  Pulse 58  Temp(Src) 98.6 F (37 C) (Temporal)   Resp 24  Wt 204 lb 9.6 oz (92.806 kg)   Orthostatic BP decreased from 99/51 lying to 87/50. Pulse 58 stable in all positions.  Get up and go completed in 10 seconds  General: healthy, alert, no distress. Below L eye small area of healed cut.   Upper lip with healing scab.  Missing two teeth upper L side  Neck supple without adenopathy  Lungs distant breath sounds but no rales, rhonchi, wheezes  Heart Reg bradycardia 2/6 systolic murmur heard in both 2nd ICS and tricuspid valve area  Neuro:  Grossly oriented.  Cranial nerves II-XII are normal, though hearing decreased.  Sensory exam to touch and motor exam intact and symmetrical in upper and lower extremities.  Reflexes at  biceps, knees and ankles symmetrical.  Finger to nose testing normal.  Gait and station normal.    No nystagmus    EKG normal except for bradycardia just under 60  CBC stable with low nl Hct 38% stable since 2013  CMP nl electrolytes, blood glucose. GFR 39, Cr 1.7 improved from 1.97 in 2014. No sign LFT abnormalities    Also brought in a series of BPs:  6/13 Am 134/60   PM 124/62  6/14 AM 127/60  PM 126/53  6/15 PM 126/53  6/25 PM 102/53  6/27 noon 120/56  6/28 PM 96/52  Evening 95/48  6/29 am 105/55  PM 100/53  6/30 PM 102/49  7/1 AM 128/62  PM 96/42  7/2 AM 120/62  PM 98/49    ASSESSMENT:  (E888.9) Fall, initial encounter  (primary encounter diagnosis)  (458.0) Orthostatic hypotension   Patient's symptoms and BP today suggest orthostatic hypotension as possible etiology of falls. Had evaluation last year as inpatient that was noncontributory.     PLAN:  Discontinue Nifedipine. Metoprolol is needed as BP agent and to slow progression of thoracic aortic aneurysm. Benazepril reasonable given CKD and hx NSTEMI. Could consider d/cing Tamsulosin, but patient needed symptomatically and is on the lowest possible dose.  Given hot weather, drink 6-8 cups water daily.  RTC in 7-10 days to follow up with Dr. Satira Sark.  Continue checking BP and send  readings to Dr. Satira Sark next week.  Get immediate medical attention if has additional fall.      Augusto Garbe, MD

## 2013-10-26 NOTE — Patient Instructions (Signed)
Discontinue the Nifedipine.  Drink plenty of fluids -- 6-8 cups water daily.  Continue to check your blood pressure twice daily.  Return to clinic to see Dr. Satira Sark within 7-10 days if possible.  If you fall again, call the clinic if daytime hours so you can be seen. You should be seen for any future falls.

## 2013-10-26 NOTE — Progress Notes (Signed)
OD:   20/40-1    OS:  20/40-1    OU:   20/30-1

## 2013-10-30 ENCOUNTER — Encounter (INDEPENDENT_AMBULATORY_CARE_PROVIDER_SITE_OTHER): Payer: Self-pay | Admitting: Family Medicine

## 2013-11-09 NOTE — Telephone Encounter (Signed)
Followed up on this case to see if pt received all of his medication refills for the past month. Pt did receive refills for the Metoprolol, Gabapentin, and Citalopram but did not received the Advair. Called insurance to get the PA forms faxed. Insurance was not able to find the original PA forms on the pt's file. Spoke with pt and he stated that [facility] sent back the Rx for the Advair. Routing to Dr. Satira Sark as URGENT! Pt has been without his Advair for 3 weeks! Please write a new Rx for the Advair.

## 2013-11-10 ENCOUNTER — Telehealth (INDEPENDENT_AMBULATORY_CARE_PROVIDER_SITE_OTHER): Payer: Self-pay | Admitting: Family Medicine

## 2013-11-10 MED ORDER — FLUTICASONE-SALMETEROL 250-50 MCG/ACT IN AEPB
1.0000 | INHALATION_SPRAY | Freq: Two times a day (BID) | RESPIRATORY_TRACT | Status: DC
Start: 2013-11-10 — End: 2014-09-06

## 2013-11-10 NOTE — Telephone Encounter (Signed)
Please see TE from 10/24/13 regarding response for the ecare above.

## 2013-11-10 NOTE — Telephone Encounter (Signed)
Received confirmation from Dr. Satira Sark that the Advair was sent to [facility] today. [facility] confirmed receipt of Rx. They will be processing the refill which takes 24-48 hours to fully process. Left general message with pt. Sent pt ecare message regarding refill processing.

## 2013-11-13 ENCOUNTER — Ambulatory Visit (INDEPENDENT_AMBULATORY_CARE_PROVIDER_SITE_OTHER): Payer: No Typology Code available for payment source | Admitting: Family Medicine

## 2013-11-13 VITALS — BP 118/56 | HR 56 | Temp 99.0°F | Resp 12 | Wt 205.0 lb

## 2013-11-13 DIAGNOSIS — J449 Chronic obstructive pulmonary disease, unspecified: Secondary | ICD-10-CM

## 2013-11-13 DIAGNOSIS — M48062 Spinal stenosis, lumbar region with neurogenic claudication: Secondary | ICD-10-CM

## 2013-11-13 DIAGNOSIS — I10 Essential (primary) hypertension: Secondary | ICD-10-CM

## 2013-11-13 MED ORDER — BUDESONIDE-FORMOTEROL FUMARATE 160-4.5 MCG/ACT IN AERO
2.0000 | INHALATION_SPRAY | Freq: Two times a day (BID) | RESPIRATORY_TRACT | Status: DC
Start: 2013-11-13 — End: 2014-09-06

## 2013-11-13 MED ORDER — HYDROMORPHONE HCL 4 MG OR TABS
ORAL_TABLET | ORAL | Status: DC
Start: 2013-11-13 — End: 2013-12-18

## 2013-11-13 NOTE — Patient Instructions (Signed)
YOu can try symbicort instead of advair and see how you do

## 2013-11-13 NOTE — Progress Notes (Signed)
S:  Pt is here to follow up medications and recent office visits.  Was seen recently in clinic by Dr. Bobbye Charleston for several falls--see note from visit on 10/26/2013. Because it was thought that the falls might have been precipitated by low BP, his nifedipine had been stopped.    Mr. Denomme brings in his BP records  BP was getting higher in the morning-- so Mr. Casebier restarted the nifedipine that was stopped    No more dizziness or falls since stopping the nifedipine (and restarting it)  BPs at home: are 952-841 systolic. Nifedipine seems to bring it down. Though not dangerously low    Pt states that his Last fall was because he was "stupid" --was dog sitting--walked about 2 blocks to chase his neighbour's dog without O2, tried to run, but then took 3 steps and collapsed  Knocked out a tooth. Did not lose consciousness    pain in back is "manageable". Uses hydromorphone judisciously. Helps him function.  Needs new pain prescriptions    Re depression--started taking 2 citalopram--40 mg--seems to give him more energy    Doing well with etoh abstention. Still attending AA regularly    Re: COPDHas advair at home. His insurance co wants him to try symbicort or dulera instead of advair    Patient Active Problem List    Diagnosis Date Noted   . CORONARY ARTERY DISEASE [410.90] 12/30/2010     Non-ST elevation myocardial infarction., July 18,2012  LAD drug-eluting stent nov, 2012. Clopidogrel  See [name], cardiology       . Malignant neoplasm of prostate [185] 08/14/2010     Diagnosed 07/2010. Sees [name] (urol) and [name] (rad onc). Saw [name], [name]--[facility] [place]--palladium seed implants June 2012  Mr. Howry has an intermediate risk prostate cancer on the basis of a Gleason 7 histology       . Lumbago [724.2] 12/19/2009     March 21, 2012, underwent L4-L5 bilateral laminectomies and partial medial facetectomies, as well as an L4-L5 right-sided synovial cyst resection. He says since  surgery he feels better       . Routine general medical examination at a health care facility [V70.0] 05/15/2009     06/07/2012  Td/Tdap (q 10 yr):  2004, 2012  Pneumococcal (65 yoa and high risk x1):  2010, 2004  Influenza vaccine (q 1 yr):  2010 nov, 2011, 2012, 2013  Shingles: discussed--should get at his local pharmacy  Blood pressure:  Excellent 138/64  121/72  Cholesterol (q 5 yr begin age 57):on simvastatin oct 2012: excellent--will repeat  AAA screen (x1, male age 63, if ever smoked):  Checked at [facility]-- abdo and thoracic CT--aug 2012: stable ascending aortic aneurysm  Colorectal cancer: ct colonoscopy done 2008, repeat due 2013--done April 2013: neg   PSA:  Sees urology for fu prostate ca. Has had palladium seed implants  Tobacco:  NS x 11 yrs, 50 pack yr hx  Exercise:  Reg--treadmill x 20-30 mins then weight machines--every other day! Has not been back to the gym because of the back pain  Alcohol /Drugs:  None in 26 yrs--relapse jan 2014--did inpatient treatment--now none in 37 days!  Sexual practice/contraception:  Not an issue  Advanced directives:  Does have living will. DPOA  done       . GENERAL OSTEOARTHROSIS [715.00] 01/07/2007     Has seen orthopedics--cortisone shot to L knee helped.  L knee replacement 2008     . DIVERTICULITIS  OF COLON W/O BLEED [562.11] 01/07/2007     Hospitalized at [facility] May 2008     . TOBACCO USE DISORDER [305.1] 12/24/2005     Quit 2000.  50 pack year history  EtOH--quit 1986.     Marland Kitchen HEARING LOSS NOS [389.9] 12/24/2005     Bilat hearing aids.     . ECZEMA  [692.9] 12/24/2005     Lower extremities in particular.     Marland Kitchen ERECTILE DYSFUNCTION [607.84] 12/24/2005   . BENIGN NEOPLASM LG BOWEL [211.3] 12/24/2005     Tubular adenoma. Dx'ed flex sig 2000--> colonoscopy 2000: N.  F/U 2003: mild diverticulosis, tubular adenoma.  F/U due 2008 with CT colonography.--done 7/08: ess. N.  Next due 2013       . CALCULUS OF KIDNEY [592.0] 12/24/2005     Lithotripsy 1988  Stone panel in 0ct 09:  high oxalate. Given low oxalae diet by Dr. Princella [name], nephrology in 2009.       Marland Kitchen COPD [496] 12/24/2005     05/2013: started by pulm on home O2  Emphysema: an FEV1 which is approximately at 33% of predicted    04/2010--Pulm: Chronic obstructive pulmonary disease, severe to very severe. I explained to him that continuing on the Spiriva as well as the Serevent was likely good therapy.I further told him that if he should have recurrent exacerbations within the course of the year that I would consider adding an inhaled corticosteroid at that point in time. He does not need oxygen at this point in time. He remains a nonsmoker. He is already engaged in a pulmonary rehabilitation.    Today I described to him how overall lung volume reduction surgery had no significant benefit on mortality. Subgroup analysis did indicate that those patients, who had upper lobe predominant disease with low exercise capacity were the most likely to benefit. Given the fact that he actually still has reasonably good functional status and is getting around and going to the gym and working out, it is not clear that he would be in the group that would most likely benefit, although, to safe for certain, we need to get a cardiopulmonary exercise test. At this point in time, he is feeling good with his chronic obstructive pulmonary disease therapy and is not interested in pursuing lung volume reduction surgery.    05/20/2010 CT angio: Addendum: Increased scarring in the right lung base since the prior   exam including the interval development of a new nodule. The   appearance suggests that the patient has had a recent infection of   the right lower lobe has resolved and since the prior scan. However   recommend 3 months followup of the right lower lobe lesion given that   the patient is high risk with extensive paraseptal emphysema.     08/03/11 CT Chest  1. New indeterminate nodules in the lower lobes bilaterally, the largest   nodule measures 10 x 12 mm  in the paraspinal location in the right lower lobe.  Possible considerations include infectious/inflammatory and less likely   metastasis.   2. Bilateral upper lobe predominant moderate centrilobular and paraseptal   emphysema.   3. Unchanged dilated main pulmonary artery measuring 32 mm may indicate   pulmonary arterial hypertension.   4. Aortic aneurysm involving the ascending aorta at the level of the main   pulmonary artery measuring 47 mm.     10/2011:   Impression:  1. Previous opacities resolved. New focus of small airways inflammation in  the  RUL. No worrisome nodule or opacity for malignancy. If followup of the new   airways inflammation is desired a 12 month study will be adequate.  2. Stable fibrosis and peripheral calcification in the RLL, which raises the   possibility of chronic aspiration.                  . Thoracic aneurysm without mention of rupture [441.2] 12/24/2005     Ascending aortic aneurysm.  Followed by Dr. Ralene Cork, Cardiac surgery.  Gets regular CT scans.  Goal systolic BP: 811-914  Also seeing [name] cardiology  Also has AAA     . Abdominal aneurysm without mention of rupture [441.4] 12/24/2005     Father also had AAA. Borderline, seen on CT 8/06.  Also has thoracic aneurysm     . VENTRICULAR SEPT DEFECT [745.4] 12/24/2005     Small muscular VSD--NEEDS SBE PROPHYLAXIS. (Clindamycin 600 mg prior to dental visits.)     . HYPERTROPHY PROSTATE W/O OBST [600.00] 12/24/2005   . Chronic Kidney Disease -- Stage III [588.89] 07/22/2005     Creat ~ 1.8. Normal albuminuria (8/08)  Likely 2/2 HTN, poss component from hx obstructing nephrolithiasis.  8/08 Renal: referred to Uro for 55mm stone, nonobstructing. Also doing w/u for stone type, poss target prevention.     Marland Kitchen HYPERTENSION NOS(aka HTN) [401.9] 03/29/2002   . PURE HYPERCHOLESTEROLEM [272.0] 03/29/2002   . MALIG NEOPLASM DORSAL TONGUE(aka CANCER) [141.1] 03/29/2002     [place], T1N1 squamous cell ca L tongue, partial glossectomy; L  supraorbital hyoid neck dissection       . Falls [E888.9] 10/26/2013     Inpatient evaluation September 2014, felt to have micturition syncope.  7/15 -- orthostatic hypotension sxs, BPs in afternoon in mid to high 90s to low 782N systolic, 56O to 13Y diastolic. D/Ced Nifedipine.     Marland Kitchen History of non-ST elevation myocardial infarction (NSTEMI) [412]      TX FROM [facility]       . History of alcohol dependence [303.93]      TX FROM [facility]       . Carotid stenosis [433.10]      TX FROM [facility]       . Depression [311] 11/10/2012   . Lumbar stenosis with neurogenic claudication [724.03] 02/05/2012     March 21, 2012, underwent L4-L5 bilateral laminectomies and partial medial facetectomies, as well as an L4-L5 right-sided synovial cyst resection. He says since surgery he feels better         Outpatient Prescriptions Prior to Visit   Medication Sig Dispense Refill   . Albuterol Sulfate HFA 108 (90 BASE) MCG/ACT Inhalation Aero Soln Inhale 2 puffs by mouth every 4 hours as needed for shortness of breath/wheezing. For wheezing and/or cough. 1 Inhaler 3   . Aspirin 81 MG Oral Tab Once daily     . Benazepril HCl 20 MG Oral Tab Take 1 tablet (20 mg) by mouth daily. 90 tablet 3   . Citalopram Hydrobromide 20 MG Oral Tab Take 1.5 tablets (30 mg) by mouth daily. 135 tablet 3   . Fluticasone-Salmeterol (ADVAIR DISKUS) 250-50 MCG/DOSE Inhalation AEROSOL POWDER, BREATH ACTIVATED Inhale 1 puff by mouth 2 times a day. 3 Inhaler 3   . Gabapentin 100 MG Oral Cap Take 1 capsule (100 mg) by mouth at bedtime. 90 capsule 3   . HYDROmorphone HCl 4 MG Oral Tab 1 tablet(s) by mouth every 12 hours if needed to relieve pain.  Max 60 tabs per month 60 tablet 0   . Metoprolol Succinate ER 100 MG Oral TABLET SR 24 HR Take 1 tablet (100 mg) by mouth daily. (Dose per discharge summary on 01/20/13) 90 tablet 3   . Multiple Vitamins-Minerals (MULTIVITAMIN OR) Once daily     . Pantoprazole Sodium 40 MG Oral Tab EC Take 1 tablet (40 mg) by mouth every morning. 90  tablet 3   . Simvastatin 40 MG Oral Tab Take 1 tablet (40 mg) by mouth every evening. For cholesterol 90 tablet 3   . Tamsulosin HCl 0.4 MG Oral Cap Take 1 capsule (0.4 mg) by mouth at bedtime. 90 capsule 3   . Tiotropium Bromide Monohydrate (SPIRIVA HANDIHALER) 18 MCG Inhalation Cap Inhale contents of 1 capsule (18 mcg) by mouth daily. Via handihaler 90 capsule 3   . Triamcinolone Acetonide 0.1 % External Ointment Apply 1 application topically 2 times a day. Apply to affected areas bid 80 g 1     No facility-administered medications prior to visit.         O:  Pt alert and cooperative and in NAD.wearing O2 by nasal cannula  BP 118/56 mmHg  Pulse 56  Temp(Src) 99 F (37.2 C) (Temporal)  Resp 12  Wt 205 lb (92.987 kg)  SpO2 98%     A/P:  We discussed his various conditions.  As his BP did seem to be rising off the nifedipine, I think that it is OK for him to have resumed it. He needs his BP to be relatively low to protect his aneurysms. We need to keep watch of this, he will keep track of his BPs and we will review next month.  We refilled his pain meds  We will try him on symbicort instead of advair for insurance reasons. However he still has quite a bit of advair at home which will last him for some time. His pulmonary condition is somewhat precarious, so if the symbicort experiment does not work, will have to lobby his ins co for the advair  His insurance company is also concerned about his use of citalopram esp given his age--as he is doing better with this med, we will keep him on it.  He had an EKG done 10/26/2013 which des not show QT prolongation--no change in QT from EKG done in dec 2014.    (724.03) Lumbar stenosis with neurogenic claudication  (primary encounter diagnosis)  Plan: HYDROmorphone HCl 4 MG Oral Tab            (496) COPD  Plan: Budesonide-Formoterol Fumarate 160-4.5 MCG/ACT         Inhalation Aerosol            (401.9) Unspecified essential hypertension  Plan: NIFEdipine ER (AFEDITAB CR) 30  MG Oral TABLET         SR 24 HR            Karrisa Didio Cheri Kearns, MD

## 2013-11-14 NOTE — Telephone Encounter (Signed)
Will keep him on this med for now as it seems to be helping.

## 2013-11-14 NOTE — Telephone Encounter (Signed)
Saw pt in clinic yesterday.

## 2013-12-15 ENCOUNTER — Encounter (INDEPENDENT_AMBULATORY_CARE_PROVIDER_SITE_OTHER): Payer: Self-pay | Admitting: Family Medicine

## 2013-12-18 ENCOUNTER — Ambulatory Visit (INDEPENDENT_AMBULATORY_CARE_PROVIDER_SITE_OTHER): Payer: Medicare Other | Admitting: Family Medicine

## 2013-12-18 ENCOUNTER — Encounter (INDEPENDENT_AMBULATORY_CARE_PROVIDER_SITE_OTHER): Payer: Self-pay | Admitting: Family Medicine

## 2013-12-18 VITALS — BP 120/72 | HR 55 | Temp 97.8°F | Resp 20 | Wt 208.0 lb

## 2013-12-18 DIAGNOSIS — M48062 Spinal stenosis, lumbar region with neurogenic claudication: Secondary | ICD-10-CM

## 2013-12-18 DIAGNOSIS — W19XXXA Unspecified fall, initial encounter: Secondary | ICD-10-CM

## 2013-12-18 MED ORDER — HYDROMORPHONE HCL 4 MG OR TABS
ORAL_TABLET | ORAL | Status: DC
Start: 2013-12-18 — End: 2014-01-16

## 2013-12-18 NOTE — Progress Notes (Signed)
S:  Pt is here for refill of his pain medications and review of several other conditions:    BP has stabilized  Is continuing with nifedipine is good idea.    Mood is so-so  Likes going to [employer] among friends  On citalopram which he thinks is helping  No SI or HI    Most concerning to me today:  Had a fall this past Wednesday--fell on the stairs and suffered a collapsed lung--went to [facility]  Was hospitalized for 3 days  Needed chest tube  Was given some percocet for the pain from the fall    He fell because when answering the door, he stepped backwards and fell off a step.  He did not lose consciousness. He had no symptoms of dizziness or chest pain or SOB or palpitations prior to the fall.  He continues to use supplemental O2    Re pain:  He is recovering from teh pain of the chest tube and cracked ribs.  Regarding his chronic underlying pain, he says: Hydromorphone allows him to do more in the kitchen!    O:  Pt alert and cooperative and in NAD.  BP 120/72 mmHg  Pulse 55  Temp(Src) 97.8 F (36.6 C) (Temporal)  Resp 20  Wt 208 lb (94.348 kg)  SpO2 94%  Despite his recent fall and subsequent pain and stress of recent hospitalization. Pt appears well and in good spirits.  He is wearing O2 by nasal cannula    A/P:  (E888.9) Fall, initial encounter  (primary encounter diagnosis)  Plan: REFERRAL TO PHYSICAL THERAPY        i am concerned about the number of recent pretty significant falls.  i suspect that the etiology is multifactorial--reduced proprioception, mild weakness , though I do not think there is an acute cardiac process responsible for these.  Will refer for fall prevention therapy  He should be on vitamin D ~1000 iu daily--should address this at the next visit.    (724.03) Lumbar stenosis with neurogenic claudication  Plan: HYDROmorphone HCl 4 MG Oral Tab        i refilled his pain meds.  Unfortunately, the side effects of these meds might be contributing to falls also, as might be the  gabapentin and the citalopram    i will see him again in one month, sooner prn.    Levora Angel, MD

## 2013-12-18 NOTE — Patient Instructions (Signed)
Consider going to [facility]

## 2013-12-22 ENCOUNTER — Encounter (INDEPENDENT_AMBULATORY_CARE_PROVIDER_SITE_OTHER): Payer: Self-pay | Admitting: Family Medicine

## 2014-01-05 ENCOUNTER — Inpatient Hospital Stay
Admission: EM | Admit: 2014-01-05 | Discharge: 2014-01-08 | DRG: 187 | Disposition: A | Discharge status: Home / Self Care | Payer: No Typology Code available for payment source | Attending: Thoracic Surgery (Cardiothoracic Vascular Surgery) | Admitting: Thoracic Surgery (Cardiothoracic Vascular Surgery)

## 2014-01-05 ENCOUNTER — Inpatient Hospital Stay (HOSPITAL_COMMUNITY): Payer: No Typology Code available for payment source | Admitting: Thoracic Surgery (Cardiothoracic Vascular Surgery)

## 2014-01-05 DIAGNOSIS — S2249XA Multiple fractures of ribs, unspecified side, initial encounter for closed fracture: Secondary | ICD-10-CM | POA: Diagnosis present

## 2014-01-05 DIAGNOSIS — W19XXXA Unspecified fall, initial encounter: Secondary | ICD-10-CM

## 2014-01-05 DIAGNOSIS — R0602 Shortness of breath: Secondary | ICD-10-CM

## 2014-01-05 DIAGNOSIS — J9383 Other pneumothorax: Secondary | ICD-10-CM

## 2014-01-05 DIAGNOSIS — J449 Chronic obstructive pulmonary disease, unspecified: Secondary | ICD-10-CM | POA: Diagnosis present

## 2014-01-05 DIAGNOSIS — Z880 Allergy status to penicillin: Secondary | ICD-10-CM

## 2014-01-05 DIAGNOSIS — S2190XA Unspecified open wound of unspecified part of thorax, initial encounter: Secondary | ICD-10-CM

## 2014-01-05 DIAGNOSIS — Z4682 Encounter for fitting and adjustment of non-vascular catheter: Secondary | ICD-10-CM

## 2014-01-05 DIAGNOSIS — S27329A Contusion of lung, unspecified, initial encounter: Secondary | ICD-10-CM

## 2014-01-05 DIAGNOSIS — J90 Pleural effusion, not elsewhere classified: Secondary | ICD-10-CM

## 2014-01-05 DIAGNOSIS — I252 Old myocardial infarction: Secondary | ICD-10-CM

## 2014-01-05 DIAGNOSIS — J4489 Other specified chronic obstructive pulmonary disease: Secondary | ICD-10-CM | POA: Diagnosis present

## 2014-01-05 DIAGNOSIS — Z9181 History of falling: Secondary | ICD-10-CM

## 2014-01-05 DIAGNOSIS — S271XXA Traumatic hemothorax, initial encounter: Secondary | ICD-10-CM

## 2014-01-05 DIAGNOSIS — Z9861 Coronary angioplasty status: Secondary | ICD-10-CM

## 2014-01-05 LAB — CBC, DIFF
% Basophils: 0 %
% Eosinophils: 4 %
% Immature Granulocytes: 0 %
% Lymphocytes: 18 %
% Monocytes: 10 %
% Neutrophils: 68 %
Absolute Eosinophil Count: 0.26 10*3/uL (ref 0.00–0.50)
Absolute Lymphocyte Count: 1.07 10*3/uL (ref 1.00–4.80)
Basophils: 0.01 10*3/uL (ref 0.00–0.20)
Hematocrit: 33 % — ABNORMAL LOW (ref 38–50)
Hemoglobin: 10.7 g/dL — ABNORMAL LOW (ref 13.0–18.0)
Immature Granulocytes: 0.01 10*3/uL (ref 0.00–0.05)
MCH: 30.3 pg (ref 27.3–33.6)
MCHC: 32.3 g/dL (ref 32.2–36.5)
MCV: 94 fL (ref 81–98)
Monocytes: 0.61 10*3/uL (ref 0.00–0.80)
Neutrophils: 4.17 10*3/uL (ref 1.80–7.00)
Platelet Count: 260 10*3/uL (ref 150–400)
RBC: 3.53 mil/uL — ABNORMAL LOW (ref 4.40–5.60)
RDW-CV: 15.5 % — ABNORMAL HIGH (ref 11.6–14.4)
WBC: 6.13 10*3/uL (ref 4.3–10.0)

## 2014-01-05 LAB — PROTHROMBIN & PTT
Partial Thromboplastin Time: 36 s — ABNORMAL HIGH (ref 22–35)
Prothrombin INR: 1.1 (ref 0.8–1.3)
Prothrombin Time Patient: 13.4 s (ref 10.7–15.6)

## 2014-01-05 LAB — COMPREHENSIVE METABOLIC PANEL
ALT (GPT): 11 U/L (ref 10–48)
AST (GOT): 22 U/L (ref 9–38)
Albumin: 4 g/dL (ref 3.5–5.2)
Alkaline Phosphatase (Total): 77 U/L (ref 52–227)
Anion Gap: 8 (ref 4–12)
Bilirubin (Total): 0.6 mg/dL (ref 0.2–1.3)
Calcium: 9.6 mg/dL (ref 8.9–10.2)
Carbon Dioxide, Total: 25 mEq/L (ref 22–32)
Chloride: 99 mEq/L (ref 98–108)
Creatinine: 1.4 mg/dL — ABNORMAL HIGH (ref 0.51–1.18)
GFR, Calc, African American: 60 mL/min (ref >59)
GFR, Calc, European American: 49 mL/min — ABNORMAL LOW (ref >59)
Glucose: 115 mg/dL (ref 62–125)
Potassium: 4.1 mEq/L (ref 3.6–5.2)
Protein (Total): 7.5 g/dL (ref 6.0–8.2)
Sodium: 132 mEq/L — ABNORMAL LOW (ref 135–145)
Urea Nitrogen: 25 mg/dL — ABNORMAL HIGH (ref 8–21)

## 2014-01-05 LAB — B_TYPE NATRIURETIC PEPTIDE: B_Type Natriuretic Peptide: 940 pg/mL — ABNORMAL HIGH (ref <101)

## 2014-01-05 LAB — TROPONIN_I
Troponin_I Interpretation: NORMAL
Troponin_I: <0.03 ng/mL (ref <0.04)

## 2014-01-05 LAB — L LACTATE, VENOUS PLASMA (TO U/H LAB > 30 MIN): L Lactate (Indirect), VEN: 1.3 mmol/L (ref 0.5–2.2)

## 2014-01-06 DIAGNOSIS — J9819 Other pulmonary collapse: Secondary | ICD-10-CM

## 2014-01-06 DIAGNOSIS — J9383 Other pneumothorax: Secondary | ICD-10-CM

## 2014-01-06 DIAGNOSIS — J90 Pleural effusion, not elsewhere classified: Secondary | ICD-10-CM

## 2014-01-06 LAB — BASIC METABOLIC PANEL
Anion Gap: 7 (ref 4–12)
Calcium: 8.8 mg/dL — ABNORMAL LOW (ref 8.9–10.2)
Carbon Dioxide, Total: 24 mEq/L (ref 22–32)
Chloride: 102 mEq/L (ref 98–108)
Creatinine: 1.17 mg/dL (ref 0.51–1.18)
GFR, Calc, African American: >60 mL/min (ref >59)
GFR, Calc, European American: >60 mL/min (ref >59)
Glucose: 83 mg/dL (ref 62–125)
Potassium: 4 mEq/L (ref 3.6–5.2)
Sodium: 133 mEq/L — ABNORMAL LOW (ref 135–145)
Urea Nitrogen: 20 mg/dL (ref 8–21)

## 2014-01-06 LAB — PROTHROMBIN & PTT
Partial Thromboplastin Time: 45 s — ABNORMAL HIGH (ref 22–35)
Prothrombin INR: 1.2 (ref 0.8–1.3)
Prothrombin Time Patient: 14.2 s (ref 10.7–15.6)

## 2014-01-06 LAB — CBC (HEMOGRAM)
Hematocrit: 29 % — ABNORMAL LOW (ref 38–50)
Hematocrit: 29 % — ABNORMAL LOW (ref 38–50)
Hemoglobin: 9.3 g/dL — ABNORMAL LOW (ref 13.0–18.0)
Hemoglobin: 9.3 g/dL — ABNORMAL LOW (ref 13.0–18.0)
MCH: 30.4 pg (ref 27.3–33.6)
MCH: 29.9 pg (ref 27.3–33.6)
MCHC: 32.6 g/dL (ref 32.2–36.5)
MCHC: 32.3 g/dL (ref 32.2–36.5)
MCV: 93 fL (ref 81–98)
MCV: 93 fL (ref 81–98)
Platelet Count: 235 10*3/uL (ref 150–400)
Platelet Count: 218 10*3/uL (ref 150–400)
RBC: 3.06 mil/uL — ABNORMAL LOW (ref 4.40–5.60)
RBC: 3.11 mil/uL — ABNORMAL LOW (ref 4.40–5.60)
RDW-CV: 15.2 % — ABNORMAL HIGH (ref 11.6–14.4)
RDW-CV: 15.1 % — ABNORMAL HIGH (ref 11.6–14.4)
WBC: 6.39 10*3/uL (ref 4.3–10.0)
WBC: 6.18 10*3/uL (ref 4.3–10.0)

## 2014-01-06 LAB — CALCIUM, (REFLEXIVE IONIZED)

## 2014-01-06 LAB — TYPE AND SCREEN CPOE ORDER

## 2014-01-06 LAB — MAGNESIUM: Magnesium: 1.5 mg/dL — ABNORMAL LOW (ref 1.8–2.4)

## 2014-01-06 LAB — PHOSPHATE: Phosphate: 3.1 mg/dL (ref 2.5–4.5)

## 2014-01-07 ENCOUNTER — Encounter (INDEPENDENT_AMBULATORY_CARE_PROVIDER_SITE_OTHER): Payer: Self-pay | Admitting: Family Medicine

## 2014-01-07 DIAGNOSIS — Z4682 Encounter for fitting and adjustment of non-vascular catheter: Secondary | ICD-10-CM

## 2014-01-07 LAB — PROTHROMBIN & PTT
Partial Thromboplastin Time: 42 s — ABNORMAL HIGH (ref 22–35)
Prothrombin INR: 1.1 (ref 0.8–1.3)
Prothrombin Time Patient: 13.5 s (ref 10.7–15.6)

## 2014-01-07 LAB — CBC (HEMOGRAM)
Hematocrit: 29 % — ABNORMAL LOW (ref 38–50)
Hemoglobin: 9.3 g/dL — ABNORMAL LOW (ref 13.0–18.0)
MCH: 30 pg (ref 27.3–33.6)
MCHC: 32.4 g/dL (ref 32.2–36.5)
MCV: 93 fL (ref 81–98)
Platelet Count: 223 10*3/uL (ref 150–400)
RBC: 3.1 mil/uL — ABNORMAL LOW (ref 4.40–5.60)
RDW-CV: 14.9 % — ABNORMAL HIGH (ref 11.6–14.4)
WBC: 7.28 10*3/uL (ref 4.3–10.0)

## 2014-01-07 LAB — BASIC METABOLIC PANEL
Anion Gap: 5 (ref 4–12)
Calcium: 8.7 mg/dL — ABNORMAL LOW (ref 8.9–10.2)
Carbon Dioxide, Total: 27 mEq/L (ref 22–32)
Chloride: 101 mEq/L (ref 98–108)
Creatinine: 1.21 mg/dL — ABNORMAL HIGH (ref 0.51–1.18)
GFR, Calc, African American: >60 mL/min (ref >59)
GFR, Calc, European American: 58 mL/min — ABNORMAL LOW (ref >59)
Glucose: 111 mg/dL (ref 62–125)
Potassium: 3.6 mEq/L (ref 3.6–5.2)
Sodium: 133 mEq/L — ABNORMAL LOW (ref 135–145)
Urea Nitrogen: 19 mg/dL (ref 8–21)

## 2014-01-07 LAB — PHOSPHATE: Phosphate: 2.7 mg/dL (ref 2.5–4.5)

## 2014-01-07 LAB — CALCIUM, (REFLEXIVE IONIZED)

## 2014-01-07 LAB — MAGNESIUM: Magnesium: 1.5 mg/dL — ABNORMAL LOW (ref 1.8–2.4)

## 2014-01-08 DIAGNOSIS — M899 Disorder of bone, unspecified: Secondary | ICD-10-CM

## 2014-01-08 DIAGNOSIS — M949 Disorder of cartilage, unspecified: Secondary | ICD-10-CM

## 2014-01-08 LAB — BASIC METABOLIC PANEL
Anion Gap: 5 (ref 4–12)
Calcium: 8.6 mg/dL — ABNORMAL LOW (ref 8.9–10.2)
Carbon Dioxide, Total: 27 mEq/L (ref 22–32)
Chloride: 100 mEq/L (ref 98–108)
Creatinine: 1.29 mg/dL — ABNORMAL HIGH (ref 0.51–1.18)
GFR, Calc, African American: >60 mL/min (ref >59)
GFR, Calc, European American: 54 mL/min — ABNORMAL LOW (ref >59)
Glucose: 118 mg/dL (ref 62–125)
Potassium: 3.9 mEq/L (ref 3.6–5.2)
Sodium: 132 mEq/L — ABNORMAL LOW (ref 135–145)
Urea Nitrogen: 23 mg/dL — ABNORMAL HIGH (ref 8–21)

## 2014-01-08 LAB — CBC (HEMOGRAM)
Hematocrit: 27 % — ABNORMAL LOW (ref 38–50)
Hemoglobin: 9 g/dL — ABNORMAL LOW (ref 13.0–18.0)
MCH: 30.6 pg (ref 27.3–33.6)
MCHC: 33.1 g/dL (ref 32.2–36.5)
MCV: 93 fL (ref 81–98)
Platelet Count: 234 10*3/uL (ref 150–400)
RBC: 2.94 mil/uL — ABNORMAL LOW (ref 4.40–5.60)
RDW-CV: 14.9 % — ABNORMAL HIGH (ref 11.6–14.4)
WBC: 7.86 10*3/uL (ref 4.3–10.0)

## 2014-01-08 LAB — MAGNESIUM: Magnesium: 1.5 mg/dL — ABNORMAL LOW (ref 1.8–2.4)

## 2014-01-08 LAB — PROTHROMBIN & PTT
Partial Thromboplastin Time: 41 s — ABNORMAL HIGH (ref 22–35)
Prothrombin INR: 1.2 (ref 0.8–1.3)
Prothrombin Time Patient: 14.6 s (ref 10.7–15.6)

## 2014-01-08 LAB — PHOSPHATE: Phosphate: 3 mg/dL (ref 2.5–4.5)

## 2014-01-08 LAB — CALCIUM, (REFLEXIVE IONIZED)

## 2014-01-09 ENCOUNTER — Encounter (HOSPITAL_BASED_OUTPATIENT_CLINIC_OR_DEPARTMENT_OTHER): Payer: No Typology Code available for payment source | Admitting: Cardiovascular Disease

## 2014-01-09 LAB — TYPE AND SCREEN (SENDOUT): Indir. Antiglobulin Rslt (Sendout): NEGATIVE

## 2014-01-16 ENCOUNTER — Ambulatory Visit (INDEPENDENT_AMBULATORY_CARE_PROVIDER_SITE_OTHER): Payer: No Typology Code available for payment source | Admitting: Family Medicine

## 2014-01-16 ENCOUNTER — Encounter (INDEPENDENT_AMBULATORY_CARE_PROVIDER_SITE_OTHER): Payer: Self-pay | Admitting: Family Medicine

## 2014-01-16 VITALS — BP 92/54 | HR 68 | Temp 98.8°F | Resp 12 | Wt 198.0 lb

## 2014-01-16 DIAGNOSIS — Z23 Encounter for immunization: Secondary | ICD-10-CM

## 2014-01-16 DIAGNOSIS — J90 Pleural effusion, not elsewhere classified: Secondary | ICD-10-CM

## 2014-01-16 DIAGNOSIS — J439 Emphysema, unspecified: Secondary | ICD-10-CM

## 2014-01-16 DIAGNOSIS — J438 Other emphysema: Secondary | ICD-10-CM

## 2014-01-16 DIAGNOSIS — S2231XG Fracture of one rib, right side, subsequent encounter for fracture with delayed healing: Secondary | ICD-10-CM

## 2014-01-16 MED ORDER — HYDROMORPHONE HCL 4 MG OR TABS
ORAL_TABLET | ORAL | Status: DC
Start: 2014-01-16 — End: 2014-02-27

## 2014-01-16 NOTE — Patient Instructions (Signed)
Set up the visiting nurse (re the in home care)  (they will call [facility])

## 2014-01-16 NOTE — Progress Notes (Signed)
Tracy Conway is a 79 year old male here to discuss the following:    (492.8) Pulmonary emphysema, unspecified emphysema type  (primary encounter diagnosis)  (V54.19) Rib fracture, right, with delayed healing, subsequent encounter  (511.9) Pleural effusion  One week post discharge from [facility] for drainage of pleural effusion, s/p rib frx on the right from GL fall.  At home he has done well except he ran out of his pain meds (last on late sun night), was sort of ok yesterday but right chest pain is much worse today.  O2 sats at home have been 95-97% at home, including right before coming in.  No fevers, no drainage from chest tube site  Low appetite for one day, but feels he will eat tonight  No change in regular bowel movements without straining  No cough  Has incentive spirometer  He would like some in home nursing which he says his insurance said they would cover.    (V03.82) Need for prophylactic vaccination against Streptococcus pneumoniae (pneumococcus)      OBJECTIVE:  BP 92/54 mmHg  Pulse 68  Temp(Src) 98.8 F (37.1 C) (Temporal)  Resp 12  Wt 198 lb (89.812 kg)  SpO2 87%  General: alert, moderate distress, oriented x 3, showing difficulty in moving.  Chest: good expansion, but shows pain, on effort.  Slightly dull right base but good breath sounds through out all lung fields a few crackles at right base  Cor: Regular no extra sounds    ASSESSMENT AND PLAN:    (492.8) Pulmonary emphysema, unspecified emphysema type  (primary encounter diagnosis)  (V54.19) Rib fracture, right, with delayed healing, subsequent encounter  (511.9) Pleural effusion  Suspect pain is from being off his usual narcotic.  BS suggest that effusion has not re accumulated.  His O2 sat here is concerning, but he says it is fine at home.  He would like some in home nursing which he says his insurance said they would cover.    Plan: HYDROmorphone HCl 4 MG Oral Tab, refilled  Check O2 sat at home; if persistently below 90, come back  here or to [place]  [place] to talk with MSW re possible other services,   [place]    [phone]) Need for prophylactic vaccination against Streptococcus pneumoniae (pneumococcus)  Plan: pneumococcal (Prevnar 13) conjugate vacc         (adult) 0.5 mL IM - 03500      Tish Frederickson, MD

## 2014-01-16 NOTE — Progress Notes (Signed)
Vaccine Screening Questions      1.  Have you had a serious reaction or an allergic reaction to a vaccine?  NO    2.  Currently have a moderate or severe illness, including fever? (Don't Ask if vaccine   ordered by provider same day)  NO    3.  Ever had a seizure or a brain or other nervous system problem syndrome associated with a vaccine? (DTaP/TDaP/DTP pertinent) NO    4.  Is patient receiving  any live vaccinations today? (Varicella-Chickenpox, MMR-Measles/Mumps/Rubella, Zoster-Shingles)  NO    If YES to any of the questions above - Do NOT give vaccine.  Consult with RN or provider in clinic.  (#4 can be YES if all Live vaccine questions are answered NO)    If NO to all questions above - Patient may receive vaccine.    5.  Do you need to receive the Flu vaccine today? NO    Vaccine information sheet(s) discussed, patient/parent/guardian verbalized understanding? YES     VIS given 01/16/2014 by Vivia Birmingham MA.        Vaccine given today without initial adverse effect. [place], [place]

## 2014-01-18 ENCOUNTER — Encounter (INDEPENDENT_AMBULATORY_CARE_PROVIDER_SITE_OTHER): Payer: No Typology Code available for payment source | Admitting: Sports Medicine

## 2014-01-18 ENCOUNTER — Telehealth (INDEPENDENT_AMBULATORY_CARE_PROVIDER_SITE_OTHER): Payer: Self-pay | Admitting: Family Medicine

## 2014-01-18 ENCOUNTER — Encounter (INDEPENDENT_AMBULATORY_CARE_PROVIDER_SITE_OTHER): Payer: Self-pay | Admitting: Family Medicine

## 2014-01-18 DIAGNOSIS — I219 Acute myocardial infarction, unspecified: Secondary | ICD-10-CM

## 2014-01-18 DIAGNOSIS — W19XXXS Unspecified fall, sequela: Secondary | ICD-10-CM

## 2014-01-18 DIAGNOSIS — J439 Emphysema, unspecified: Secondary | ICD-10-CM

## 2014-01-18 DIAGNOSIS — M159 Polyosteoarthritis, unspecified: Secondary | ICD-10-CM

## 2014-01-18 DIAGNOSIS — Z9981 Dependence on supplemental oxygen: Secondary | ICD-10-CM

## 2014-01-18 DIAGNOSIS — M48062 Spinal stenosis, lumbar region with neurogenic claudication: Secondary | ICD-10-CM

## 2014-01-18 DIAGNOSIS — I252 Old myocardial infarction: Secondary | ICD-10-CM

## 2014-01-18 NOTE — Telephone Encounter (Signed)
Routing to referral pool. [facility] not contracted with [facility]. Needs new home health

## 2014-01-18 NOTE — Telephone Encounter (Signed)
CONFIRMED PHONE NUMBER: [phone]  CALLERS FIRST AND LAST NAME: Nanda Quinton  FACILITY NAME: [facility] TITLE: Intake Coordinator  CALLERS RELATIONSHIP:Caregiver  RETURN CALL: OK to leave detailed message with anyone that answers     SUBJECT: General Message   REASON FOR REQUEST: Referral    MESSAGE: Unable to take Mr. Radliff on for service due to the fact that we are not contracted with [employer]

## 2014-01-18 NOTE — Telephone Encounter (Signed)
Redirecting [facility] referral to [facility].

## 2014-01-18 NOTE — Telephone Encounter (Signed)
CONFIRMED PHONE NUMBER:   Telephone Information:   Home Phone [phone]   Work Phone [phone]   Mobile [phone]   CALLERS FIRST AND LAST NAME: Zoe Lan  FACILITY NAME: n/a TITLE: n/a  CALLERS RELATIONSHIP:Spouse  RETURN CALL: Detailed message on voicemail only     SUBJECT: General Message   REASON FOR REQUEST: Pain medication, home health    MESSAGE: Patient's spouse would like a call back regarding patient's pain medication & referral to home health services.  She can be reached at [phone], it is okay to leave a detailed message.  Thank you.

## 2014-01-19 ENCOUNTER — Other Ambulatory Visit (HOSPITAL_BASED_OUTPATIENT_CLINIC_OR_DEPARTMENT_OTHER): Payer: Self-pay | Admitting: Thoracic Surgery (Cardiothoracic Vascular Surgery)

## 2014-01-19 DIAGNOSIS — Z09 Encounter for follow-up examination after completed treatment for conditions other than malignant neoplasm: Secondary | ICD-10-CM

## 2014-01-19 NOTE — Telephone Encounter (Signed)
Routing to [facility] and Team to please Media planner.

## 2014-01-19 NOTE — Telephone Encounter (Signed)
Patient's wife calling again concerned that she has not heard back from anyone in regards to her concerns. The medication HYDROmorphone HCl 4 MG Oral Tab is not doing anything for him. He is still in a lot of pain. Change medication or up the dosage.     Also, still have not heard back about [facility] not being contracted with insurance. Please provide them with home health that is.     Wife is worried that patient will go without the necessary medication all weekend. She would like to hear from someone today.

## 2014-01-19 NOTE — Telephone Encounter (Signed)
To the front desk staff:  I see that we have documented that we have advanced directives for Tracy Conway, though I can't seem to find the actual directives.  Can you tell me where they are? I thought they would be in "Media" but I can't seem to see them. It could very well be my eyes.  Can you check on this and let me know? (or send this to whoever may be able to answer this question?)  Thanks!  Jonluke Cobbins

## 2014-01-19 NOTE — Telephone Encounter (Signed)
Referrals: "Also Dr. Wynne Dust made a referral to [facility] (Order [phone]). My health insurance is through [employer] and allows up to 130 visits per year from a home health care nurse. I received a call today from [facility]. They are not contracted with [facility] and as a result WE CANNOT use them. May we please request a referral to a different home health care service provider? Thank you."

## 2014-01-19 NOTE — Telephone Encounter (Signed)
Mrs. Sides is calling in again. She would like Dr. Satira Sark or her care team to call her back as soon as possible. Thank You.

## 2014-01-19 NOTE — Telephone Encounter (Signed)
Routing to triage pool. Please check with patient that he did not take 22 pills of his HYDROmorphone HCl 4 MG Oral Tab.

## 2014-01-20 NOTE — Telephone Encounter (Signed)
Spoke with kathie--Pt's wife.  Her concerns:  Pain meds do not seem to be working anymore. We talked about weaning off the medication as the best option, though a difficult one.  It does not seem as if [name] took the missing 22 pills at once. Not sure what happened to them.  She has a caregiver come in i believe daily M-F for a couple of hours to help Bill get up in the mornings.  Discussed that [name] should try to be as active as possible. Will prescribe a walker with a seat to help in this quest.  Re home health--Bill now has a [facility] case manager who did not think that home health was indicated at this time, and I think that maybe not either, though he will get an evaluation to see if they can suggest measures that might be of benefit for him.  He has an appt with me on Oct 1--I look forward to seeing him then.

## 2014-01-21 ENCOUNTER — Encounter (INDEPENDENT_AMBULATORY_CARE_PROVIDER_SITE_OTHER): Payer: Self-pay | Admitting: Family Medicine

## 2014-01-22 ENCOUNTER — Encounter (HOSPITAL_BASED_OUTPATIENT_CLINIC_OR_DEPARTMENT_OTHER): Payer: Self-pay | Admitting: Thoracic Surgery (Cardiothoracic Vascular Surgery)

## 2014-01-22 ENCOUNTER — Ambulatory Visit
Payer: No Typology Code available for payment source | Attending: Thoracic Surgery (Cardiothoracic Vascular Surgery) | Admitting: Thoracic Surgery (Cardiothoracic Vascular Surgery)

## 2014-01-22 VITALS — BP 111/57 | HR 62 | Temp 97.7°F | Ht 71.0 in | Wt 194.0 lb

## 2014-01-22 DIAGNOSIS — J9383 Other pneumothorax: Secondary | ICD-10-CM | POA: Insufficient documentation

## 2014-01-22 DIAGNOSIS — IMO0001 Reserved for inherently not codable concepts without codable children: Secondary | ICD-10-CM | POA: Insufficient documentation

## 2014-01-22 DIAGNOSIS — J939 Pneumothorax, unspecified: Secondary | ICD-10-CM | POA: Insufficient documentation

## 2014-01-22 DIAGNOSIS — S2231XD Fracture of one rib, right side, subsequent encounter for fracture with routine healing: Secondary | ICD-10-CM

## 2014-01-22 DIAGNOSIS — S2231XA Fracture of one rib, right side, initial encounter for closed fracture: Secondary | ICD-10-CM | POA: Insufficient documentation

## 2014-01-22 MED ORDER — OXYCODONE HCL 5 MG OR TABS
5.0000 mg | ORAL_TABLET | Freq: Three times a day (TID) | ORAL | Status: DC | PRN
Start: 2014-01-22 — End: 2014-01-25

## 2014-01-22 NOTE — Patient Instructions (Signed)
Your xray today shows resolution of your pneumothorax    You should continue to increase your activity as tolerated    You will follow up with Dr. Janyce Llanos in 4 weeks with a chest xray prior to your appointment    Please do not hesitate to call our office with any questions or concerns

## 2014-01-22 NOTE — Progress Notes (Signed)
[place]     Attending Physician  Vista Deck, MD    Primary care provider: Santiago Bumpers, MD    Patient Identification  Tracy Conway is a 79 year old male who was admitted 9/11-9/14/15 for treatment of pulmonary effusion in the setting of rib fractures secondary to a ground level fall.    HPI  Tracy Conway is a 79 year old male who presented to the emergency department with symptoms of increasing dyspnea, confusion and fatigue following a ground level fall.  On imaging, he was found to have a right pleural effusion and rib fractures.  He was treated with a chest tube with improvement of his symptoms.  Following removal of his chest tube, he had a small pneumothorax.  Since returning home, his symptoms have continued to improved, although he has not yet returned to his baseline level of functioning.  Prior to his fall, he was using home oxygen intermittently for shortness of breath associated with COPD.  Since returning home, he has been using 2 L of oxygen continuously but feels that his breathing is improving with time.  He experiences a right sided aching pain intermittently throughout the day.  His pain is relieved with 5-10 mg oxycodone as needed three times per day.  He is slowly increasing his level of activity.  He denies cough, hemoptysis, changes in appetite, unintentional weight loss, fever, chills, night sweats.    Review of Systems  Pertinent positive information reviewed above. He denies headache, fever, chills, night sweats or dizziness. No change in appetite, nausea, vomiting, or diarrhea. He denies wheezing, worsening shortness of breath, chest or incisional pain. He denies palpitations, numbness, tingling, dysuria, hematuria or urinary frequency.    Physical Exam  Physical Exam   Constitutional: He is oriented to person, place, and time and well-developed, well-nourished, and in no distress.   HENT:   Head: Normocephalic and atraumatic.   Mouth/Throat: No oropharyngeal exudate.      Eyes: Conjunctivae are normal. Pupils are equal, round, and reactive to light.   Neck: Normal range of motion. Neck supple.   Cardiovascular: Normal rate, regular rhythm and normal heart sounds.    Pulmonary/Chest: Effort normal and breath sounds normal.   Right sided incision consistent with prior CT site with superficial dehiscence, CDI, no erythema    Abdominal: Soft. Bowel sounds are normal.   Musculoskeletal: Normal range of motion. He exhibits no edema.   Lymphadenopathy:     He has no cervical adenopathy.   Neurological: He is alert and oriented to person, place, and time. No cranial nerve deficit.   Skin: Skin is warm and dry.   Psychiatric: Mood, memory, affect and judgment normal.       Pathology  None    Xray/Diagnostic Studies  01/22/14 Chest xray: Previously seen tiny right pneumothorax has resolved.  Peripheral right basilar consolidation is unchanged. No new focal opacity.  Heart and mediastinum are stable.  Osteopenic spine with compression fracture of the midthoracic vertebral body as before.     Assessment/Plan  Overall, Tracy Conway is doing well after admission for right pleural effusion and rib fractures, treated with chest tube thoracostomy. His incision is healing without evidence of infection and steri strips were applied today.  Wound care instructions were reviewed with the patient.  His pain is well controlled with narcotic pain medication and a refill was given today.  We discussed tapering pain medication and I suspect he should be able to achieve this over the  next couple of weeks.  He will continue to increase his activity as tolerated.  As he is still requiring continuous oxygen and narcotic pain medication, I would like him to return to clinic in 4 weeks with a chest xray.    I spent approximately a total of 25 minutes face to face with the patient, of which more than 50% was spent counseling and/or coordinating the patients care as outlined in this note.

## 2014-01-23 ENCOUNTER — Telehealth (INDEPENDENT_AMBULATORY_CARE_PROVIDER_SITE_OTHER): Payer: No Typology Code available for payment source | Admitting: Clinical

## 2014-01-23 ENCOUNTER — Encounter (HOSPITAL_BASED_OUTPATIENT_CLINIC_OR_DEPARTMENT_OTHER): Payer: Self-pay | Admitting: Thoracic Surgery (Cardiothoracic Vascular Surgery)

## 2014-01-23 NOTE — Telephone Encounter (Signed)
SOCIAL WORK BRIEF ASSESSMENT    01/23/2014  Primary / referring provider: Pt referred by Provider: Tish Frederickson    Brief description: Provider reports that pt may benefit from in home assistance.     Patient description, presenting problem, patient / family goals: SWer called pt to f/u on possibility of additional in-home assistance. Pt reports that at the present time "I'm pretty well taken care of." And that a family friend comes in the morning to help him get ready. Pt would like to get Rollator walker as it would help pt get exercise around the community. He says that he would like to walk around [place] and he would like to have something to support him "that's something I would like to get as soon as possible." Pt stated that he thinks his insurance will cover it with a prescription. Discussed additional resources if not covered [employer] in [place], [place]). SWer confirmed with pt that provider had placed order and pt will pick it up during appointment on 01/25/14.     When asked if pt felt that his current needs were being met he responded, "absolutely." SWer inquired about pt progress in recovery and he stated, "I feel pretty good" and that the pain level down is around 1-2 for the first time in a long time. Also that "my mood is good today" probably because pain is better. "I really feel good today."     Pt states that his wife wanted him to ask about the possibility of getting a home health care nurse to come for "wound care." He wants to know if insurance would cover service. Regarding incision, he reported that there was some oozing, but otherwise healing well. Wants to discuss further with Dr. Satira Sark during appointment on 01/25/14 but asked that SWer send along note to provider as well.     Contacts: Pt     Impression: Pt appears to be in a positive mood and have a strong social support system. Pt appears motivated to increase exercise and continue recovery, will  benefit from walker.     Intervention / Plan for follow-up: SWer will send message to Dr. Satira Sark regarding pt request for wound care and pt will pick up order for walker during previously scheduled appointment on 01/25/14. Pt is aware that he can call SWer if he needs additional resources. SWer will monitor situation and assist pt in accessing services as needed. F/u in 1-2 weeks.

## 2014-01-23 NOTE — Telephone Encounter (Signed)
Patient is scheduled with [facility] for Friday (10/2) for home services.

## 2014-01-23 NOTE — Telephone Encounter (Signed)
Decided not to place another order for home health care. There was one already placed. Pt should be evaluated by the home health nurse.  If the home health nurse sees that he needs wound care, then that can be arranged. So--no need for another referral (i think).  Would you be able to call the pt or his wife [name] to inform him of this? Thanks!  Margreta Journey

## 2014-01-23 NOTE — Telephone Encounter (Signed)
Dr. Satira Sark: Patient is scheduled with [facility] for Friday (10/2) for home services. I do not see advanced directives scanned in for patient.   Forwarding to [place] for assistance.

## 2014-01-23 NOTE — Telephone Encounter (Signed)
Will place order for home care--wound care

## 2014-01-25 ENCOUNTER — Ambulatory Visit (INDEPENDENT_AMBULATORY_CARE_PROVIDER_SITE_OTHER): Payer: No Typology Code available for payment source | Admitting: Family Medicine

## 2014-01-25 VITALS — BP 156/83 | HR 59 | Temp 98.6°F | Resp 12 | Wt 198.0 lb

## 2014-01-25 DIAGNOSIS — S2241XD Multiple fractures of ribs, right side, subsequent encounter for fracture with routine healing: Secondary | ICD-10-CM

## 2014-01-25 DIAGNOSIS — I252 Old myocardial infarction: Secondary | ICD-10-CM

## 2014-01-25 DIAGNOSIS — I714 Abdominal aortic aneurysm, without rupture, unspecified: Secondary | ICD-10-CM

## 2014-01-25 DIAGNOSIS — W19XXXD Unspecified fall, subsequent encounter: Secondary | ICD-10-CM

## 2014-01-25 DIAGNOSIS — R609 Edema, unspecified: Secondary | ICD-10-CM

## 2014-01-25 DIAGNOSIS — S2231XD Fracture of one rib, right side, subsequent encounter for fracture with routine healing: Secondary | ICD-10-CM

## 2014-01-25 DIAGNOSIS — M545 Low back pain, unspecified: Secondary | ICD-10-CM

## 2014-01-25 DIAGNOSIS — J42 Unspecified chronic bronchitis: Secondary | ICD-10-CM

## 2014-01-25 DIAGNOSIS — J939 Pneumothorax, unspecified: Secondary | ICD-10-CM

## 2014-01-25 MED ORDER — HYDROCHLOROTHIAZIDE 25 MG OR TABS
25.0000 mg | ORAL_TABLET | Freq: Every day | ORAL | Status: DC
Start: 2014-01-25 — End: 2014-01-25

## 2014-01-25 MED ORDER — LIDOCAINE 5 % EX PTCH
1.0000 | MEDICATED_PATCH | CUTANEOUS | Status: DC
Start: 2014-01-25 — End: 2014-10-30

## 2014-01-25 MED ORDER — HYDROCHLOROTHIAZIDE 25 MG OR TABS
12.5000 mg | ORAL_TABLET | Freq: Every day | ORAL | Status: DC
Start: 2014-01-25 — End: 2014-08-06

## 2014-01-25 NOTE — Patient Instructions (Addendum)
Make sure you follow up with Dr. Claybon Jabs.  We will start the diuretic again for the peripheral edema.  Keep track of your blood pressures still--let me know if you have concerns about your readings    Take vitamin D 1000 iu daily to help with bone strength

## 2014-01-25 NOTE — Telephone Encounter (Signed)
Pt notified at [place] today with PCP.

## 2014-01-25 NOTE — Progress Notes (Signed)
Reason for Visit: follow up    Refills? NO  Referral? NO  Letter or Form? NO  Lab Results? NO    HEALTH MAINTENANCE:  Has the patient had this done since their last visit?  Cervical screening/PAP: N/A  Mammo: N/A  Colon Screen: N/A    Have you seen a specialist since your last visit: Yes - Name, location, and date Dr. Mulligan-thoratic    Vaccines Due? Yes, zoster    HM Due:   Health Maintenance   Topic Date Due   . COLONOSCOPY Q 5 YEARS  09-27-1934   . ZOSTER VACCINE  09/26/1996   . TETANUS BOOSTER  05/20/2020   . PNEUMO VACC,AGE BASED  Completed   . INFLUENZA VACCINE  Completed       PCP Verified?  Yes, Dr. Satira Sark

## 2014-01-25 NOTE — Progress Notes (Signed)
S:  Pt is here for follow up several issues.    # Re pain:  I had a discussion with pt's wife over the weekend. We discussed that if the hydromorphone is no longer helping the pain, then it should be stopped.  Pt is actually OK with this idea.  Weaning off the hydromorphone at one per day. He doe not want any more refills.  Had rib trauma and had pain from that which covers up the back pain  Wonders about lidocaine patches--for the post thoracostomy pain and wonders if it would help the back pain though is skeptical about this    Just taking hydromorphone 1 tablet in am  Not taking gabapentin anymore--was not effective    Back pain  Is in the belt region--not very localized.  Seems to be better if he is not up on his feet too much.  Since his recent hospitalizations--he has not been so active so the back pain may be a bit better  Pt does have AAA--had Ct colonography 06/2012: at that time the AAA was noted to be 3.1 cm      # Needs prescription for Walker with seat--this was ordered on teh weekend but needs to be reprinted    # Some swelling in both legs--has compression socks--wonders about diuretic again--he was on this in the past--thinks it might have be d/c'ed after his MI  In 2012    # [place] that comes in am to help him get ready--she and his wife [name] thought he might need home wound care  He does not think that he needs home health, thinks that in general he is doing well  There was a concern that he might need home wound care nurse  He was referred to home health by Dr. Colbert Ewing spoke to the agency rep on the phone--they are apparently looking into to resources for him    # Blood pressures at home: 111-150's/60's-70's  Wonders if this is OK for his aneurysms. Did see Dr. Ralene Cork in the past who thought that his systolics should be in the 100-120 range.  Needs to see his cardiologist [name].  I am concerned about lowering his blood pressure too much and thereby possibly  precipitating falls    # ROS  Doing OK in general.  2 days ago--had euphoric feeling!--was happy  ADLs: Can get up by himself, fed the dogs today    # re falls and rib fractures:  One fall last month--initially seen in ER [facility]--had chest tube inserted--had collapsed lung and rib fractures. Tube then fell out overnight on its own. Xray looked ok so tube not replaced.  The next week--developed pain and lower abdo and had a huge bruise on her chest--went to [facility]--had collapsed lung again--also had fluid in lung. Chest tube placed--apparently drew out 3 L fluid from his chest. Chest tube was eventually removed  That (where chest tube was) spot still hurts.  Still has pain from the chest tube site--now pain is 3/10--so getting better  Got oxycodone for that but not taking it anymore    # Notices that he is getting clumsy--  Has appt [facility] fall clinic [facility] in next week or so    # depression  Taking citalopram 40 mg daily--he is not sure how much of an effect they are having. But is basically OK with still taking this dose    # COPD  Lungs: doing well--using O2 pretty  Much all the time since rib fracture    O:  Pt alert and cooperative and in NAD. Wearing O2 by nasal cannula  BP 156/83 mmHg  Pulse 59  Temp(Src) 98.6 F (37 C) (Temporal)  Resp 12  Wt 198 lb (89.812 kg)  SpO2 93%  Lungs clear to bases though crackles at R base. Pretty good AE throughout no wheezing  There is a healing incision site on the R lateral chest wall --site of the chest tube--there is no surrounding erythema,. There is mild thickish yellow DC from approx the middle of the lesion--the thoracic surgeon had documented superficial dehiscence in this site  Does have some bilat periph edema to mid shin    A/P:  1) pain in general. He is weaning off teh hydromorphone which is great.    2) will see if lidocaine patches are helpful for the chest wall pain due to the chest tube    3) back pain: will observe. Likely due to MSK factors though I  am concerned about whether AAA could be contributing  Did have back surgery in the recent past:  March 21, 2012, underwent L4-L5 bilateral laminectomies and partial medial facetectomies, as well as an L4-L5 right-sided synovial cyst resection.  Did have xray LS spine in 2014: There is advanced lower lumbar spine facet arthropathy.   i would consider doing another CT abdo though pt has had so many, that I am a bit reticent    4) walker with seat prescribed    5) bilat edema--will represcribe hctz which he had in the past for leg edema    6) home health--will see what the agency recommends. I do not think he requires home wound care--the chest wall lesion should heal spontaneously    7) blood pressure--mildly elevated today. I suggest that he talk over his BP control with [name].  He may be able to tolerate a bit lower BP. i have prescribed the hctz at 12. 5 mg daily.  He will track his BPs and let me know if he has concerns    8) falls. Encouraged him to attend the fall clinic at [facility].  Also suggested that he take vitamin D for bone health    9) depression: will stay on 40 mg citalopram for now. Might consider reducing to 20 mg--will re-address on next visit    10) COPD--stay on home O2 for now. Wean as tolerated.    11) rib fractures--should heal spontaneously. Last CXR done by Dr. Janyce Llanos CT surg several days ago--stable.    I would like to see him again in 4 weeks.    I spent a total time of 45 minutes face-to-face with the patient, of which more than 50% was spent counseling and coordinating care as outlined in this note.    Levora Angel, MD

## 2014-01-30 ENCOUNTER — Telehealth (INDEPENDENT_AMBULATORY_CARE_PROVIDER_SITE_OTHER): Payer: Self-pay | Admitting: Clinical

## 2014-01-30 NOTE — Telephone Encounter (Signed)
SOCIAL WORK FOLLOW-UP NOTE    01/30/2014  Brief description: SWer called pt to f/u on wellbeing and inquire about outstanding needs.     Patient description, presenting problem, patient / family goals: Pt stated that he is doing well and sent in his prescription for the walker to the medical supply company today for the second time as they said they didn't get it when he sent it last week. Pt confirmed that he had discussed home care with his medical provider and stated that he had no additional outstanding needs. Pt reported that his priority is to increase his "strength and stamina." SWer encouraged pt to call clinic if there is any difficulty processing prescription for walker.     Intervention / Outcome / Plan: Pt will call if additional resources are needed. No further SW f/u indicated at this time.

## 2014-02-02 ENCOUNTER — Encounter (INDEPENDENT_AMBULATORY_CARE_PROVIDER_SITE_OTHER): Payer: Self-pay | Admitting: Family Medicine

## 2014-02-06 ENCOUNTER — Encounter (INDEPENDENT_AMBULATORY_CARE_PROVIDER_SITE_OTHER): Payer: Self-pay | Admitting: Family Medicine

## 2014-02-06 DIAGNOSIS — F1021 Alcohol dependence, in remission: Secondary | ICD-10-CM

## 2014-02-06 MED ORDER — ACAMPROSATE CALCIUM 333 MG OR TBEC
333.0000 mg | DELAYED_RELEASE_TABLET | Freq: Three times a day (TID) | ORAL | Status: DC
Start: 2014-02-06 — End: 2014-09-06

## 2014-02-06 NOTE — Telephone Encounter (Signed)
I called pt and sent in the prescription and emailed the pt.    Pt mentions that he is abstinent now, which he needs to be before starting this med.  Started it at 333 tid though it might be that his kidney function actually could support a bigger dose, though I was reticent to prescribe the higher dose  I also reviewed his last PHQ-9  He has an appt with me at the end of the month when we will reassess his progress

## 2014-02-08 ENCOUNTER — Telehealth (INDEPENDENT_AMBULATORY_CARE_PROVIDER_SITE_OTHER): Payer: Self-pay | Admitting: Clinical

## 2014-02-08 NOTE — Telephone Encounter (Signed)
Called pt to f/u per provider request, no answer. Left voicemail requesting return call.      [facility] - If pt returns call, please transfer to [phone] for today only.

## 2014-02-12 ENCOUNTER — Telehealth (INDEPENDENT_AMBULATORY_CARE_PROVIDER_SITE_OTHER): Payer: Self-pay | Admitting: Family Medicine

## 2014-02-12 DIAGNOSIS — J431 Panlobular emphysema: Secondary | ICD-10-CM

## 2014-02-12 DIAGNOSIS — S2231XD Fracture of one rib, right side, subsequent encounter for fracture with routine healing: Secondary | ICD-10-CM

## 2014-02-12 NOTE — Telephone Encounter (Signed)
Spoke to [place] and let her know that per last visit note patient is on oxygen almost all the time since his rib fractures. She needs a new order for O2 for their records, order can be faxed to [phone]    Routing to MD for orders

## 2014-02-12 NOTE — Telephone Encounter (Signed)
CONFIRMED PHONE NUMBER: [phone]  CALLERS FIRST AND LAST NAME: Christina   FACILITY NAME: [facility]  TITLE: Order processing   CALLERS RELATIONSHIP:OTHER: Above  RETURN CALL: General message OK     SUBJECT: General Message   REASON FOR REQUEST: Oxygen    MESSAGE: Margreta Journey called to verify patient is on oxygen. Please call to advise thank you.,

## 2014-02-13 NOTE — Telephone Encounter (Signed)
Order faxed to number below.

## 2014-02-13 NOTE — Telephone Encounter (Signed)
I have placed an order, the printed form is placed in MA inbox in pod 1 for faxing.  Thanks!  order can be faxed to [phone]

## 2014-02-19 ENCOUNTER — Ambulatory Visit
Payer: No Typology Code available for payment source | Attending: Thoracic Surgery (Cardiothoracic Vascular Surgery) | Admitting: Thoracic Surgery (Cardiothoracic Vascular Surgery)

## 2014-02-19 VITALS — BP 131/70 | HR 67 | Temp 98.6°F | Ht 71.0 in | Wt 190.0 lb

## 2014-02-19 DIAGNOSIS — J9811 Atelectasis: Secondary | ICD-10-CM

## 2014-02-19 DIAGNOSIS — S2241XD Multiple fractures of ribs, right side, subsequent encounter for fracture with routine healing: Secondary | ICD-10-CM | POA: Insufficient documentation

## 2014-02-19 NOTE — Patient Instructions (Signed)
You have continued to recover well from from you traumatic pneumothorax and your chest X-ray today in clinic shows ongoing improvement.  You do not need to follow up with unless you develop new symptoms or questions.

## 2014-02-19 NOTE — Progress Notes (Signed)
Chief Complaint / Reason For Visit       HPI/Interval History   Jaramie Bastos is a 79 year old male who was admitted 9/11-9/14/15 for treatment of pulmonary effusion in the setting of rib fractures secondary to a ground level fall on January 06, 2014     Mr. Herberger is a 79 year old male who presented to the emergency department with symptoms of increasing dyspnea, confusion and fatigue following a ground level fall. On imaging, he was found to have a right pleural effusion and rib fractures. He was treated with a chest tube with improvement of his symptoms and discharged home on January 08, 2014.  He has since been seen in clinic in late September at which time he was slowly making progress with his pain control and activity levels and was requiring 2L of O2 at all times. Mr. Monday states that he has continued using 2 L of oxygen but feels that his breathing is improving with time. He experiences a right sided aching pain intermittently throughout the day.       ROS is negative for cough, hemoptysis, fevers, chills, nausea, vomiting, chest, back or abdominal pain. No recent symptoms consistent with TIA or stroke.       Physical Exam   BP 131/70 mmHg  Pulse 67  Temp(Src) 98.6 F (37 C) (Temporal)  Ht 5\' 11"  (1.803 m)  Wt 190 lb (86.183 kg)  BMI 26.51 kg/m2  SpO2 95%  General: Well developed, well noursished, no acute distress, calm and appropriate demeanor   CV: Regular rate, no c/c/e   Resp: No increased work of breathing, speaking in full sentences, well healed chest tube incisions, no drainage, or tenderness    Abd: Soft, NT, ND, NABS       Assessment / Plan    Mr. Lindholm continues to progress well since he was last seen in clinic. His CXR today demonstrates improvement in his lung volumes and atelectasis. He still has evidence of infiltrate in his RLL which is slightly improved and no significant effusion or evidence of pneumothorax. He reports that prior to his fall and broken ribs he had been using 2L of  O2 with activity and as needed not only at night with his CPAP but also during the day and I have explained that as he has made no significant strides with his O2 requirement at the 6 week mark he may be in the position with his underlying emphysema and COPD that he will require this increased level of O2 indefinitely.  I have asked him to see his PCP for ongoing evaluation of his O2 requirement moving forward. I explained that it is not unusual for him to have some ongoing aching at his chest wall due to his healing ribs and he should otherwise continue a daily walking program and to use his IS regularly.  At this time, no formal follow up is required and we will plan to see Mr. Potocki back in clinic on an as needed basis.         This patient was discussed with Dr. Janyce Llanos.

## 2014-02-20 ENCOUNTER — Encounter (HOSPITAL_BASED_OUTPATIENT_CLINIC_OR_DEPARTMENT_OTHER): Payer: No Typology Code available for payment source | Admitting: Internal Medicine

## 2014-02-20 NOTE — Telephone Encounter (Signed)
SOCIAL WORK FOLLOW-UP NOTE    02/20/2014  Brief description: SWer called pt to f/u and see if he has any additional unmet needs.     Patient description, presenting problem, patient / family goals: Pt stated that he got is walker and is doing well. SWer asked about in home assistance and he reported that it wasn't necessary and that he is doing well on his own. Pt said that he is progressing and getting better.     Intervention / Outcome / Plan: SWer encouraged pt to call with additional questions or concerns. No additional f/u indicated at this time.

## 2014-02-23 ENCOUNTER — Ambulatory Visit (INDEPENDENT_AMBULATORY_CARE_PROVIDER_SITE_OTHER): Payer: No Typology Code available for payment source | Admitting: Family Medicine

## 2014-02-23 ENCOUNTER — Encounter (INDEPENDENT_AMBULATORY_CARE_PROVIDER_SITE_OTHER): Payer: Self-pay | Admitting: Family Medicine

## 2014-02-23 VITALS — Temp 97.9°F

## 2014-02-23 DIAGNOSIS — J939 Pneumothorax, unspecified: Secondary | ICD-10-CM

## 2014-02-23 DIAGNOSIS — S2231XD Fracture of one rib, right side, subsequent encounter for fracture with routine healing: Secondary | ICD-10-CM

## 2014-02-23 DIAGNOSIS — J431 Panlobular emphysema: Secondary | ICD-10-CM

## 2014-02-23 DIAGNOSIS — I25119 Atherosclerotic heart disease of native coronary artery with unspecified angina pectoris: Secondary | ICD-10-CM

## 2014-02-23 DIAGNOSIS — S2241XD Multiple fractures of ribs, right side, subsequent encounter for fracture with routine healing: Secondary | ICD-10-CM

## 2014-02-23 NOTE — Progress Notes (Signed)
Reason for Visit: see chief    Refills? YES  Referral? NO  Letter or Form? NO  Lab Results? NO    HEALTH MAINTENANCE:  Has the patient had this done since their last visit?  Cervical screening/PAP: N/A  Mammo: N/A  Colon Screen: Done last year at [facility]    Have you seen a specialist since your last visit: No    Vaccines Due? Yes, did not have chickenpox as a child    HM Due:   Health Maintenance   Topic Date Due   . COLONOSCOPY Q 5 YEARS  July 12, 1934   . ZOSTER VACCINE  09/26/1996   . TETANUS BOOSTER  05/20/2020   . PNEUMO VACC,AGE BASED  Completed   . INFLUENZA VACCINE  Completed       PCP Verified?  Yes, done by front desk

## 2014-02-23 NOTE — Progress Notes (Signed)
S:  Pt is here for routine follow up.    Needs new prescription for O2  He and his wife are going to separate for a while.  Going to stay with oldest daughter in [place]  Daughter has lined up a pcp and a cardiologist and a pulmonologist in [place] already  [place] about the plans, it is a change  No specific anxiety however  5 daughters live in the area in [place], also has16 grandkids, 5 greatgrandchildren  Will be around lots of family  He is using etoh off and on.    Finished [place] program for alcohol use  Still going to to [employer] 4 x a week  Intends to continue with that in [place]    Has not had any falls in recent weeks.  Recovering from chest pain related to injuries sustained in recent falls.  Still using incentive spirometer  Did have follow up with chest surgery recently, who were pleased with his progress, though it is a bit slow    Cough not that bad, has some sputum production particularly in the morning  OK with the O2 by nasal cannula, some mucous first thing in the morning and then a bit later  Some SOB      Not using any pain meds anymore  No help from lidocaine patch--did not help for the rib pain    bp readings stable at home--moslty lower than 140. Usually more in the 110's-120's range    Patient Active Problem List   Diagnosis   . HYPERTENSION NOS(aka HTN)   . Pure hypercholesterolemia   . MALIG NEOPLASM DORSAL TONGUE(aka CANCER)   . Chronic Kidney Disease -- Stage III   . TOBACCO USE DISORDER   . HEARING LOSS NOS   . ECZEMA    . ERECTILE DYSFUNCTION   . BENIGN NEOPLASM LG BOWEL   . Calculus of kidney   . Panlobular emphysema   . Thoracic aneurysm without mention of rupture   . AAA (abdominal aortic aneurysm)   . VENTRICULAR SEPT DEFECT   . 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 Generalized osteoarthrosis, unspecified site   . DIVERTICULITIS OF COLON W/O BLEED   . Routine general medical examination at a health care facility   . Lumbago   . Malignant neoplasm of prostate   . Coronary artery  disease involving native coronary artery of native heart with angina pectoris   . Lumbar stenosis with neurogenic claudication   . Depression   . History of non-ST elevation myocardial infarction (NSTEMI)   . History of alcohol dependence   . Carotid stenosis   . Falls   . Pneumothorax on right   . Right rib fracture     Outpatient Prescriptions Prior to Visit   Medication Sig Dispense Refill   . Acamprosate Calcium 333 MG Oral Tab EC Take 333 mg by mouth 3 times a day. 90 tablet 1   . Albuterol Sulfate HFA 108 (90 BASE) MCG/ACT Inhalation Aero Soln Inhale 2 puffs by mouth every 4 hours as needed for shortness of breath/wheezing. For wheezing and/or cough. 1 Inhaler 3   . Aspirin 81 MG Oral Tab Once daily     . Benazepril HCl 20 MG Oral Tab Take 1 tablet (20 mg) by mouth daily. 90 tablet 3   . Budesonide-Formoterol Fumarate 160-4.5 MCG/ACT Inhalation Aerosol Inhale 2 puffs by mouth every 12 hours. 3 Inhaler 4   . Citalopram Hydrobromide 40 MG Oral Tab Take 1 tablet (40  mg) by mouth daily.     . Fluticasone-Salmeterol (ADVAIR DISKUS) 250-50 MCG/DOSE Inhalation AEROSOL POWDER, BREATH ACTIVATED Inhale 1 puff by mouth 2 times a day. 3 Inhaler 3   . Hydrochlorothiazide 25 MG Oral Tab Take 0.5 tablets (12.5 mg) by mouth daily. 45 tablet 3   . HYDROmorphone HCl 4 MG Oral Tab 1 tablet(s) by mouth every 12 hours if needed to relieve pain. Max 60 tabs per month 60 tablet 0   . Lidocaine 5 % External Patch Apply 1 patch onto the skin every 24 hours. Remove patch(s) after 12 hours. 30 patch 6   . Metoprolol Succinate ER 100 MG Oral TABLET SR 24 HR Take 1 tablet (100 mg) by mouth daily. (Dose per discharge summary on 01/20/13) 90 tablet 3   . Multiple Vitamins-Minerals (MULTIVITAMIN OR) Once daily     . NIFEdipine ER (AFEDITAB CR) 30 MG Oral TABLET SR 24 HR Take 1 tablet (30 mg) by mouth daily.     . Pantoprazole Sodium 40 MG Oral Tab EC Take 1 tablet (40 mg) by mouth every morning. 90 tablet 3   . Simvastatin 40 MG Oral Tab Take  1 tablet (40 mg) by mouth every evening. For cholesterol 90 tablet 3   . Tamsulosin HCl 0.4 MG Oral Cap Take 1 capsule (0.4 mg) by mouth at bedtime. 90 capsule 3   . Tiotropium Bromide Monohydrate (SPIRIVA HANDIHALER) 18 MCG Inhalation Cap Inhale contents of 1 capsule (18 mcg) by mouth daily. Via handihaler 90 capsule 3   . Triamcinolone Acetonide 0.1 % External Ointment Apply 1 application topically 2 times a day. Apply to affected areas bid 80 g 1     No facility-administered medications prior to visit.     O:  Pt alert and cooperative and in NAD.  Temp(Src) 97.9 F (36.6 C) (Temporal)  Chest: good A/E bilat  Slight decrease at R base    A/P:  (J43.1) Panlobular emphysema  (primary encounter diagnosis)  Plan: DME, DISABLED PARKING PERMIT AUTHORIZATION        Gave him a prescription for home O2  Also gave him prescription and form for disability parking    (J93.9) Pneumothorax on right  Plan: DME, DISABLED PARKING PERMIT AUTHORIZATION            (S22.31XD) Right rib fracture, with routine healing, subsequent encounter  Plan: DME, DISABLED PARKING PERMIT AUTHORIZATION           (S22.41XD) Multiple fractures of ribs of right side with routine healing  Plan: DME, DISABLED PARKING PERMIT AUTHORIZATION            (I25.119) Coronary artery disease involving native coronary artery of native heart with angina pectoris  Plan: DISABLED PARKING PERMIT AUTHORIZATION    Encouraged him with his success in combating etoh issues.  He will contact me from [place] should that be necessary    continue to use the incentive spirometer to reinflate lung and to combat atelectasis    He has not had treatment at the "fall clinic".  However as he is moving to [place], this is impractical now.    He will let me know if there are other issues that he needs prior to his departure in several weeks.    Levora Angel, MD

## 2014-02-27 ENCOUNTER — Other Ambulatory Visit (INDEPENDENT_AMBULATORY_CARE_PROVIDER_SITE_OTHER): Payer: Self-pay | Admitting: Family Medicine

## 2014-02-27 ENCOUNTER — Encounter (INDEPENDENT_AMBULATORY_CARE_PROVIDER_SITE_OTHER): Payer: Self-pay | Admitting: Family Medicine

## 2014-02-27 DIAGNOSIS — J449 Chronic obstructive pulmonary disease, unspecified: Secondary | ICD-10-CM

## 2014-02-28 MED ORDER — TIOTROPIUM BROMIDE MONOHYDRATE 18 MCG IN CAPS
ORAL_CAPSULE | RESPIRATORY_TRACT | Status: DC
Start: 2014-02-28 — End: 2014-09-06

## 2014-02-28 NOTE — Telephone Encounter (Signed)
Patient last seen on 11/13/13 for COPD

## 2014-03-01 ENCOUNTER — Telehealth (INDEPENDENT_AMBULATORY_CARE_PROVIDER_SITE_OTHER): Payer: Self-pay | Admitting: Family Medicine

## 2014-03-01 DIAGNOSIS — J431 Panlobular emphysema: Secondary | ICD-10-CM

## 2014-03-01 DIAGNOSIS — I25119 Atherosclerotic heart disease of native coronary artery with unspecified angina pectoris: Secondary | ICD-10-CM

## 2014-03-01 NOTE — Telephone Encounter (Signed)
CONFIRMED PHONE NUMBER: [phone]  CALLERS FIRST AND LAST NAME: Renee  FACILITY NAME: [facility] TITLE: representative  CALLERS RELATIONSHIP:OTHER: see above  RETURN CALL: Detailed message on voicemail only     SUBJECT: General Message   REASON FOR REQUEST: oxygen prescription    MESSAGE: Renee called to clarify patient's oxygen prescription. She said as written it is too vague to fill. She requests a new prescription be written specifically noting the patient's type of oxygen, liter flow, and oxygen saturation levels. It can be faxed to [phone]; if there is any issue with that fax number, it can also be sent to [phone].

## 2014-03-01 NOTE — Telephone Encounter (Signed)
Routing to Dr Satira Sark for new order with patient's type of oxygen, liter flow, and oxygen saturation levels noted

## 2014-03-02 NOTE — Telephone Encounter (Signed)
Called and left message on pt's mobile to get him to call us with the "type of oxygen" and the flow rate.

## 2014-03-02 NOTE — Telephone Encounter (Signed)
Pt returning call - patient has a Helm unit and a portable unit and the Flow rate - 2 & 3 litres per minute. Please call back to [phone]. Thank you.

## 2014-03-05 NOTE — Telephone Encounter (Signed)
Will do in clinic on tuesday

## 2014-03-08 NOTE — Telephone Encounter (Signed)
I resigned the home oxygen prescription.  Can this be faxed right away?  I hope this will help.  I will not have internet again til tomorrow. But I can recheck in the morning if there are problems

## 2014-03-08 NOTE — Telephone Encounter (Signed)
Patient called in again about this request, said he needs 2 portable cannisters to 5 liters and cannister refiller  has a Helm unit and a portable unit and the Flow rate - 2 & 3 litres per minute. Paging Dr Satira Sark to advise

## 2014-03-08 NOTE — Telephone Encounter (Addendum)
Rx faxed to [facility] at [phone], fax confirmed and spoke with Renee to confirm new order receipt.  Left message for patient letting him know this is taken care of.

## 2014-03-08 NOTE — Telephone Encounter (Signed)
Renee calling again from [facility]    Asking if dr Satira Sark ordered the oxygen for this patient    Urgent because pt is out of oxygen, was supposed to be done Tuesday,    They are wanting a call back, they close at 3pm local [place] time    Renee's Ph: [phone]

## 2014-03-08 NOTE — Telephone Encounter (Signed)
Patient family calling back, he has spoken to the [facility] still needs more information to fulfill this request. Please call the patient at [phone]. He is on his cell phone and got cut off during this conversation. Thank you.

## 2014-03-13 ENCOUNTER — Telehealth (INDEPENDENT_AMBULATORY_CARE_PROVIDER_SITE_OTHER): Payer: Self-pay | Admitting: Family Medicine

## 2014-03-13 NOTE — Telephone Encounter (Signed)
Faxed completed order for Certificate of Medical Necessity to [facility] at [phone]. Fax was successful. Placed fax in to be scanned pile per PCP.

## 2014-03-13 NOTE — Telephone Encounter (Signed)
[facility] has sent Certificate of Medical Necessity (oxygen) for your signature.  Kept in your folder please.

## 2014-05-14 ENCOUNTER — Telehealth (INDEPENDENT_AMBULATORY_CARE_PROVIDER_SITE_OTHER): Payer: Self-pay | Admitting: Family Medicine

## 2014-05-14 NOTE — Telephone Encounter (Signed)
Hi Margreta Journey,    This patient has overdue labs from May 2015. Most of these have been collected at other visits except the lipid, ALT and TSH. Would you like me to send a reminder letter or is it okay to cancel them as duplicates?    Thanks,  Lanelle Bal

## 2014-05-16 ENCOUNTER — Other Ambulatory Visit (INDEPENDENT_AMBULATORY_CARE_PROVIDER_SITE_OTHER): Payer: Self-pay | Admitting: Family Medicine

## 2014-05-16 DIAGNOSIS — I219 Acute myocardial infarction, unspecified: Secondary | ICD-10-CM

## 2014-05-16 DIAGNOSIS — N259 Disorder resulting from impaired renal tubular function, unspecified: Secondary | ICD-10-CM

## 2014-05-16 DIAGNOSIS — E7800 Pure hypercholesterolemia, unspecified: Secondary | ICD-10-CM

## 2014-05-16 NOTE — Telephone Encounter (Signed)
Just cancel the orders. We can reorder these as necessary  I think the patient is in [place] now anyway.  Thanks!  Rin Gorton

## 2014-05-18 ENCOUNTER — Telehealth (INDEPENDENT_AMBULATORY_CARE_PROVIDER_SITE_OTHER): Payer: Self-pay | Admitting: Family Medicine

## 2014-05-18 NOTE — Telephone Encounter (Signed)
Hello,    Patient did not present to lab for blood work on 1/20. Order has been cancelled. Please place future order for required testing.     Thank you,  Lab Staff

## 2014-08-06 ENCOUNTER — Encounter (INDEPENDENT_AMBULATORY_CARE_PROVIDER_SITE_OTHER): Payer: Self-pay | Admitting: Family Medicine

## 2014-08-06 ENCOUNTER — Ambulatory Visit (INDEPENDENT_AMBULATORY_CARE_PROVIDER_SITE_OTHER): Payer: No Typology Code available for payment source | Admitting: Family Medicine

## 2014-08-06 VITALS — BP 119/54 | HR 75 | Temp 98.1°F | Resp 13 | Wt 214.0 lb

## 2014-08-06 DIAGNOSIS — I10 Essential (primary) hypertension: Secondary | ICD-10-CM

## 2014-08-06 DIAGNOSIS — R609 Edema, unspecified: Secondary | ICD-10-CM

## 2014-08-06 DIAGNOSIS — J431 Panlobular emphysema: Secondary | ICD-10-CM

## 2014-08-06 DIAGNOSIS — C029 Malignant neoplasm of tongue, unspecified: Secondary | ICD-10-CM

## 2014-08-06 MED ORDER — BENAZEPRIL HCL 20 MG OR TABS
20.0000 mg | ORAL_TABLET | Freq: Every day | ORAL | Status: DC
Start: 2014-08-06 — End: 2014-10-02

## 2014-08-06 MED ORDER — NIFEDIPINE ER 30 MG OR TB24
30.0000 mg | EXTENDED_RELEASE_TABLET | Freq: Every day | ORAL | Status: DC
Start: 2014-08-06 — End: 2014-09-06

## 2014-08-06 MED ORDER — HYDROCHLOROTHIAZIDE 25 MG OR TABS
12.5000 mg | ORAL_TABLET | Freq: Every day | ORAL | Status: DC
Start: 2014-08-06 — End: 2014-10-12

## 2014-08-06 NOTE — Progress Notes (Signed)
[facility] OUTPATIENT VISIT       Chief Complaint:    Tracy Conway is a 80 year old male who comes to clinic today primarily for COPD(1). Other issues include HTN (2), voice changes  (3)    History of Present Complaint(s):      1.Panlobular emphysema:   -currently on 1L at rest and increased to 2L with activity   -recently moved back from [place]   -he would like to change to liquid o2 and they need a prescription  -denies other complaints and states his condition is stable      Chart review from pulmonlogist note in 05/2013:     Laboratory data from 01/19/13: serum bicarbonate 22, creatinine 1.68, Hct 38%     Spirometry from 06/19/09: FVC 3.2 L (67% predicted), FEV1 1.1 L (31% predicted), FEV1/FVC 0.36.     Chest CT from 11/02/11: New focus of small airway inflammation in the right upper lobe, stable fibrosis and peripheral calcification in the right lower lobe concerning for chronic aspiration.     2V CXR, 05/16/13: Worsening consolidation in the right mid and lower lungs representing pneumonia. Left lung is clear. No pleural effusion or pneumothorax. Heart size is normal.    Six minute walk test, 05/31/13: At rest, SpO2 95% with HR 54. After walking 630 feet, SpO2 fell to 88%. 2 L/min O2 was applied to keep oxygen saturation > 88%. Tracy Conway ambulated 1105 feet. At the end of the test, SpO2 was 90% and heart rate was 75. Dypsnea was 4 and leg pain 5 on the Borg scale. This represents mildly impaired exercise tolerance and exertional hypoxemia.    2. HTN:   -denies CP, HA, blurry vision  -SBP of 119/54   -on ramipril and HCTZ and tolerating w/o side effect   -hx of thoracic and aortic aneurysm     3.Voice changes:   -brought up at the end of the visit   -states he has noticed changes in his voice   -occuring over the past several months     Review of Systems:    ROS as listed above in the HPI.     PMH:  Patient Active Problem List   Diagnosis   . HYPERTENSION NOS(aka HTN)   . Pure  hypercholesterolemia   . MALIG NEOPLASM DORSAL TONGUE(aka CANCER)   . Chronic Kidney Disease -- Stage III   . TOBACCO USE DISORDER   . HEARING LOSS NOS   . ECZEMA    . ERECTILE DYSFUNCTION   . BENIGN NEOPLASM LG BOWEL   . Calculus of kidney   . Panlobular emphysema   . Thoracic aneurysm without mention of rupture   . AAA (abdominal aortic aneurysm)   . VENTRICULAR SEPT DEFECT   . 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 Generalized osteoarthrosis, unspecified site   . DIVERTICULITIS OF COLON W/O BLEED   . Routine general medical examination at a health care facility   . Lumbago   . Malignant neoplasm of prostate   . Coronary artery disease involving native coronary artery of native heart with angina pectoris   . Lumbar stenosis with neurogenic claudication   . Depression   . History of non-ST elevation myocardial infarction (NSTEMI)   . History of alcohol dependence   . Carotid stenosis   . Falls   . Pneumothorax on right   . Right rib fracture     Past Surgical History   Procedure Laterality Date   .  Ventral hernia repair       x3   . Removal of tongue carcinoma with lymphadenectomy     . Fusion l foot distal phalange     . Lithotrypsy of r renal calculi     . Retinaculum release b/l wrists     . Vasectomy w/tray       x2   . Vasectomy reversal     . Rpr 1st ingun hrna age 64 mo-5 yrs reducible     . Revj tot knee arthrp fem&entire tibial compone       left knee   . Ligation prq vas deferens uni/bi spx     . Brachy therapy     . Lipotripsy surgery     . Heart stent placement  09/2010, 02/2011     [facility]   . Unlisted procedure spine  Mar 21 2012     L spine surgery        Social History:   History     Social History   . Marital Status: Married     Spouse Name: N/A   . Number of Children: 5   . Years of Education: N/A     Occupational History   . STAFF      Purchasing     Social History Main Topics   . Smoking status: Former Smoker -- 1.00 packs/day for 40 years     Quit date: 05/11/1998   . Smokeless tobacco: Never Used       Comment: quit three years ago   . Alcohol Use: 1.0 oz/week     2 drink(s) per week      Comment: quit 17 years ago   . Drug Use: No   . Sexual Activity:     Partners: Female      Comment: no protection     Other Topics Concern   . Not on file     Social History Narrative    From sept 2011 note from Dr. Claybon Jabs, cardiology:  He has been married 3 times, 25 years to his current wife.  He has 5 daughters living and well although two of his daughters are rather overweight. He is working as a Water quality scientist. He smoked one package of cigarettes a day from the age of 81 years until about 10 years ago, although for the last 5 years of smoking, he was down to about half a package of cigarettes a day.  He is an admitted alcoholic and quit drinking in 1981, at which time he attended [place] meetings but not any more.        1.24.2012: Married to [employer]. 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 Plans on spending more time woodworking. Goes to gym--exercises 20-30 mins every other day. Ct    -- Considering working for [employer]       Family History:   Family History   Problem Relation Age of Onset   . Cancer Father      Prostate   . Arthritis Father    . Other Father      Chronic Kidney Disease   . Psych Mother      Alzheimer's Disease   . Cancer Brother        Allergies:  Review of patient's allergies indicates:  Allergies   Allergen Reactions   . Penicillin G      hives     . Percocet [Oxycodone-Acetaminophen]      indigestion  Medications:  Please see EPIC medications tab     Physical Exam:  BP 119/54 mmHg  Pulse 75  Temp(Src) 98.1 F (36.7 C) (Temporal)  Resp 13  Wt 214 lb (97.07 kg)  SpO2 95%  GEN:  Alert, pleasant, NAD  HEENT:  NC/AT, sclera/conj clear, normal posterior oropharynx, no LAD   PULM:  CTAB but prolonged expiratory phase, wearing o2   CV:  2/6 HSM heard best int he LUSB  NEURO:  CN II-XII grossly intact, normal coordination  SKIN:  no rashes or worrisome lesions, warm  and dry  PSY:  normal affect, appropriate interactions and questions, alert and oriented, normal insight and grossly normal memory    ________________________________________________________________________    ASSESSMENT/PLAN::  Zoe Lan is a 80 year old male with:    #COPD: states he is doing well on his current management and presented only for a prescription for liquid O2. I will discuss with his pulmonologist and if no issues arise will prescribe it. He has a wellness visit in the near future. Will sent script to [facility].     #HTN: HCTZ and ramipril refilled. He states he had recent lab work in [place] and will bring [place] a copy of those records. BP currently at goal     #voice changes: Brought up at the end of the visit and not specifically address but he mainly requested a referral to see ENT which was provided. Denies type B symptoms.       Follow-up:  W/ PCP       --  Delynn Flavin, MD  [facility]) PGY-2  Pager [phone]

## 2014-08-06 NOTE — Progress Notes (Signed)
I have personally discussed the case with the resident during or immediately after the patient visit including review of history, physical exam, diagnosis, and treatment plan. 41y with emphysema & HTN, h/o SCC tongue. Here for liquid O2 rx, seeing pulm, unclear what need is, staff msg Dr. Maxcine Ham. Refill HTN meds. Also c/o hoarse voice, refer to ENT.

## 2014-08-06 NOTE — Patient Instructions (Signed)
Will look into liquid oxygen and let you know when it was written   Refilled medication today   Please bring [place] copies of your recent lab work from [place]   Referral to ENT placed

## 2014-08-07 ENCOUNTER — Telehealth (HOSPITAL_BASED_OUTPATIENT_CLINIC_OR_DEPARTMENT_OTHER): Payer: Self-pay | Admitting: Cardiovascular Disease

## 2014-08-07 NOTE — Telephone Encounter (Signed)
Patient called stating that he came to the clinic on 04/11 and [facility] still does not have the prescription for Oxygen. Phone number for [facility] is [phone], no fax provided. For questions, please call patient at [phone]. Patient said that it is very important.

## 2014-08-07 NOTE — Telephone Encounter (Signed)
From 4/11 visit note  #COPD: states he is doing well on his current management and presented only for a prescription for liquid O2. I will discuss with his pulmonologist and if no issues arise will prescribe it. He has a wellness visit in the near future.     Routing to Dr Rozann Lesches to advise

## 2014-08-07 NOTE — Telephone Encounter (Signed)
Received call from [place] from [facility] asking for the prescription for Oxygen. Phone number for [facility] is [phone] and the fax is [phone]. Thank you.

## 2014-08-07 NOTE — Telephone Encounter (Signed)
Per Dr Rozann Lesches order was faxed to [facility] this afternoon

## 2014-08-07 NOTE — Telephone Encounter (Signed)
CONFIRMED PHONE NUMBER: [phone]  CALLERS FIRST AND LAST NAME: Henrine Screws  FACILITY NAME: [facility] TITLE: Physician  CALLERS RELATIONSHIP:OTHER: See above.  RETURN CALL: Detailed message on voicemail only     SUBJECT: General Message   REASON FOR REQUEST: Care coordination    MESSAGE: Dr. Deirdre Pippins states that patient's blood pressure is low and that he has had syncopy and 4 falls in the last 2 months. Please call to discuss.

## 2014-08-08 ENCOUNTER — Encounter (INDEPENDENT_AMBULATORY_CARE_PROVIDER_SITE_OTHER): Payer: Self-pay | Admitting: Family Medicine

## 2014-08-08 NOTE — Telephone Encounter (Addendum)
Per Dr. Deirdre Pippins he had a fall a couple months ago and had a Pneumothorax and some cracked ribs.  Chest tube was placed at [facility] but needed to be replaced so he was transferred to [facility].     He typically feels steady on his feet.   BP was 96/54 at recent home visit and he is having some postural dizziness.  Requests [name] review and consider lowering cardiac medication dosages.     Per [name] scheduled appointment April 19 at 11:15, plan to consider med changes after assessment in clinic.

## 2014-08-08 NOTE — Telephone Encounter (Signed)
Replied to patient via Bea Laura informing him that I re-faxed form to [place].

## 2014-08-09 ENCOUNTER — Telehealth (HOSPITAL_BASED_OUTPATIENT_CLINIC_OR_DEPARTMENT_OTHER): Payer: Self-pay

## 2014-08-09 ENCOUNTER — Other Ambulatory Visit (HOSPITAL_BASED_OUTPATIENT_CLINIC_OR_DEPARTMENT_OTHER): Payer: Self-pay | Admitting: Internal Medicine

## 2014-08-09 DIAGNOSIS — J432 Centrilobular emphysema: Secondary | ICD-10-CM

## 2014-08-09 NOTE — Telephone Encounter (Signed)
Pt states he is changing DME for his oxygen from [facility] to [facility] in [place].  States his PCP Dr. Rozann Lesches sent a script for Oxygen to [facility], along with pt's Feb 2015 6 MWT but [employer] needs recent testing done for Oxygen qualification. Pt requests we do this ASAP. Notified pt I would contact Dr. Maxcine Ham and we would call him back.  He states appreciation.

## 2014-08-09 NOTE — Telephone Encounter (Signed)
Dr. Maxcine Ham notified me and Merry Proud RT she has ordered the 6MWT and pt will f/u with his PCP for his COPD management.  Merry Proud will call pt to schedule.

## 2014-08-10 ENCOUNTER — Ambulatory Visit: Payer: No Typology Code available for payment source

## 2014-08-10 ENCOUNTER — Other Ambulatory Visit (HOSPITAL_BASED_OUTPATIENT_CLINIC_OR_DEPARTMENT_OTHER): Payer: No Typology Code available for payment source | Admitting: Internal Medicine

## 2014-08-10 DIAGNOSIS — J432 Centrilobular emphysema: Secondary | ICD-10-CM

## 2014-08-10 NOTE — Progress Notes (Signed)
I have reviewed the resident's documentation and agree with it.

## 2014-08-10 NOTE — Progress Notes (Signed)
Respiratory Care Services  6 Minute Walk Preliminary Data Sheet         Gender: Male    Age: 80          Home Oxygen Therapy: YES   Flow Rate:  2  l/min        At nightYES     Supplemental Oxygen During the Test: NO    Flow Rate:   -   l/min  continuous flow NO    Walking Aid: NO    Description:   -     Position of probe (finger or forehead):  forehead     Pulse oximetry: Baseline:   99%   End of Test:   91%      Heart rate:  Baseline:   69   End of Test:   73    Borg Dyspnea: Baseline:   0   End of Test:   0    Borg Leg Fatigue: Baseline:   0   End of Test:   0    Desaturation to 88%: Time:   -   Distance:   - feet    Desaturation to 80%:  Time:   -   Distance:   - feet         Stopped / Paused Before the 6 Minutes:Yes    Reason:   x3 for increased shortness of breath.    Distance Walked in 6 minutes   670   feet     Comments:   Lowest SpO2 = 91%, Highest HR = 93.

## 2014-08-13 ENCOUNTER — Ambulatory Visit (INDEPENDENT_AMBULATORY_CARE_PROVIDER_SITE_OTHER): Payer: No Typology Code available for payment source | Admitting: Otolaryngology

## 2014-08-13 ENCOUNTER — Encounter (INDEPENDENT_AMBULATORY_CARE_PROVIDER_SITE_OTHER): Payer: Self-pay | Admitting: Otolaryngology

## 2014-08-13 ENCOUNTER — Telehealth (HOSPITAL_BASED_OUTPATIENT_CLINIC_OR_DEPARTMENT_OTHER): Payer: Self-pay | Admitting: Internal Medicine

## 2014-08-13 VITALS — BP 120/54 | Ht 70.87 in | Wt 214.0 lb

## 2014-08-13 DIAGNOSIS — J383 Other diseases of vocal cords: Secondary | ICD-10-CM

## 2014-08-13 NOTE — Telephone Encounter (Signed)
I called [name] to review the results of his six minute walk test. He had impaired exercise tolerance and poor chronotropic response to exercise (likely from beta blocker use), but oxygenation was 99% at rest and dropped to 91% with exercise. I let Tracy Conway know that he did have some desaturation, but that it was not low enough to warrant supplemental oxygen.    He initially raised concern that the test was faulty and that he wanted to dispute the result, but I assured him that it is safe to have an oxygen saturation of 88-92% and that I would not be able to prescribe oxygen based on these results. He expressed understanding.

## 2014-08-13 NOTE — Progress Notes (Signed)
08/13/2014    Seen in consultation at the request of:      Dear Dr. Bobbye Charleston,      Thank you for trusting me with the care of Tracy Conway.     Please feel free to call me with any questions (office: [phone]).      Sincerely,                 Kirke Shaggy, MD         CONSULTATION      PATIENT:  Tracy Conway  DOB:  [birth year]  MRN:  [id number]      CHIEF COMPLAINT/HPI:  Patient is a 80 year old male who presented with hoarseness for 2 months.  Symptoms are moderate,   fluctuating, and unchanged . It is described as scratchy and weak.  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  He denies dysphagia, odynophagia, sore throat, dyspnea, hemoptysis, fever, night sweats,   and weight loss.  He quit smoking in 2000, one pack daily for 40 years, and he is a former alcoholic.         Prior Imaging:   CXR 02/19/14:  Compared to 01/22/2014, consolidation in the right base has partly cleared.   There is also less atelectasis in the left base than before. Lung volume has   increased slightly.    There are rib fractures on the right.    Heart size is normal and unchanged. The aorta is tortuous. The arch is   calcified, indicating atherosclerosis.      PMHX:  Past Medical History   Diagnosis Date   . Aortic aneurysm of unspecified site without mention of rupture      Aortic Aneurysm   . MALIG NEOPLASM DORSAL TONGUE(aka CANCER) 03/29/2002     Ssurgery 1994, T1N1 squamous cell ca L tongue, partial glossectomy; L supraorbital hyoid neck dissection    . Tobacco use disorder 12/24/2005     Quit 2000.   Marland Kitchen Unspecified hearing loss 12/24/2005     Bilat hearing aids.   . ECZEMA  12/24/2005     Lower extremities in particular.   Marland Kitchen ERECTILE DYSFUNCTION 12/24/2005   . Benign neoplasm of colon 12/24/2005     Tubular adenoma. Dx'ed flex sig 2000--> colonoscopy 2000: N.  F/U 2003: mild diverticulosis, tubular adenoma.  F/U due 2008 with CT  colonography.   . Other specified disorders resulting from impaired renal function 07/22/2005     Creat ~ 1.2   . Calculus of kidney 12/24/2005     Lithotripsy 1988   . 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 Thoracic aneurysm without mention of rupture 12/24/2005     Ascending aortic aneurysm.  Followed by Dr. Ralene Cork, Cardiac surgery.  Gets regular CT scans.  Goal systolic BP: 308-657   . Abdominal aneurysm without mention of rupture 12/24/2005     Borderline, seen on CT 8/06.   . Ventricular septal defect 12/24/2005     Small muscular VSD--NEEDS SBE PROPHYLAXIS. (Clindamycin 600 mg prior to dental visits.)   . Hypertrophy of prostate without urinary obstruction and other lower urinary tract symptoms (LUTS) 12/24/2005   . Generalized osteoarthrosis, unspecified site 01/07/2007     Has seen orthopedics--cortisone shot to L knee helped.   . Diverticulitis of colon (without mention of hemorrhage) 01/07/2007     Hospitalized at  [facility] May 2008   . Unspecified disorder of muscle, ligament, and fascia    . Sleep apnea      PSHX:  Past Surgical History   Procedure Laterality Date   . Ventral hernia repair       x3   . Removal of tongue carcinoma with lymphadenectomy     . Fusion l foot distal phalange     . Lithotrypsy of r renal calculi     . Retinaculum release b/l wrists     . Vasectomy w/tray       x2   . Vasectomy reversal     . Rpr 1st ingun hrna age 49 mo-5 yrs reducible     . Revj tot knee arthrp fem&entire tibial compone       left knee   . Ligation prq vas deferens uni/bi spx     . Brachy therapy     . Lipotripsy surgery     . Heart stent placement  09/2010, 02/2011        . Unlisted procedure spine  Mar 21 2012     L spine surgery      FHX:   Family History   Problem Relation Age of Onset   . Cancer Father      Prostate   . Arthritis Father    . Other Father      Chronic Kidney Disease   . Psych Mother      Alzheimer's Disease   . Cancer Brother      SHX:  History     Social History   . Marital Status: Married     Spouse Name:  N/A   . Number of Children: 5   . Years of Education: N/A     Occupational History   . STAFF      Purchasing     Social History Main Topics   . Smoking status: Former Smoker -- 1.00 packs/day for 40 years     Types: Cigarettes     Quit date: 05/11/1998   . Smokeless tobacco: Never Used      Comment: quit three years ago   . Alcohol Use: 1.0 oz/week     2 Standard drinks or equivalent per week      Comment: quit 17 years ago   . Drug Use: No   . Sexual Activity:     Partners: Female      Comment: no protection     Other Topics Concern   . Not on file     Social History Narrative    From sept 2011 note from Dr. Claybon Jabs, cardiology:  He has been married 3 times, 25 years to his current wife.  He has 5 daughters living and well although two of his daughters are rather overweight. He is working as a Water quality scientist. He smoked one package of cigarettes a day from the age of 43 years until about 10 years ago, although for the last 5 years of smoking, he was down to about half a package of cigarettes a day.  He is an admitted alcoholic and quit drinking in 1981, at which time he attended [place] meetings but not any more.        1.24.2012: Married to [employer]. 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 Plans on spending more time woodworking. Goes to gym--exercises 20-30 mins every other day. Ct    -- Considering working for [employer]  ALLERGIES:  is allergic to penicillin g and percocet.    MEDICATIONS:  has a current medication list which includes the following prescription(s): acamprosate calcium, albuterol sulfate hfa, aspirin, benazepril hcl, budesonide-formoterol fumarate, citalopram hydrobromide, fluticasone-salmeterol, hydrochlorothiazide, lidocaine, metoprolol succinate er, multiple vitamins-minerals, nifedipine er, pantoprazole sodium, simvastatin, tamsulosin hcl, tiotropium bromide monohydrate, and triamcinolone acetonide.      REVIEW OF SYSTEMS:    Constitutional: negative  Eye:  negative  ENT:  + hoarseness.   No excessive cerumen, fullness in ears, infections, otalgia, tinnitus, vertigo, noise exposure, Nose/Sinus: anosmia/hyposmia, nasal drainage, epistaxis, facial pain, nasal congestion, nasal obstruction, rhinorrhea, sinusitis, sneezing, Throat: taste change, dysphagia, odynophagia, post-nasal drainage, sore tongue, pharyngitis, mouth sores, snoring, tooth pain   Cardiovascular: negative   Respiratory:  + emphysema  Gastrointestinal:  Negative  Endocrine:  negative    Neurological: negative  Psychiatric:  negative   Genitourinary:  negative .  Integumentary: negative and no rashes nor lesions of concern   Musculoskeletal: negative   Hematologic: negative  Immunologic: no known environmental allergies      PHYSICAL EXAMINATION:  Filed Vitals:    08/13/14 1633   BP: 120/54   Height: 5' 10.87" (1.8 m)   Weight: 214 lb (97.07 kg)        General: Well developed, appearing stated age and in no acute distress.  Participated in the patient interview appropriately.  Eyes: Clear conjunctive, normal lids. Pupils equal, round and reactive to light.  Extraocular movements intact.  Ears: visualized by otoscopy.    Right: Pinna unremarkable.  External ear canal without masses or skin abnormality. Free of cerumen. Tympanic membrane intact and without retraction or perforation. Middle ear without fluid.  No hearing loss.     Left: Pinna unremarkable.  External ear canal without masses or skin abnormality. Free of cerumen. Tympanic membrane intact and without retraction or perforation. Middle ear without fluid. No hearing loss.    Nose:  Nasal mucosa normal with clear mucus.  Midline septum with no polyps or lesions.  Mild turbinate hypertrophy.  OP: Clear without masses or ulcerations. Normal lips and gums.  Normal hard palate, soft palate, and posterior pharynx. No parotid or submandibular gland tenderness, masses, or swelling.  Soft BOT/FOM.  Neck: midline trachea, normal thyroid without enlargement,  tenderness, and masses.  Lymphatic: ? 1 cm left level II lymph node.  normal submandibular, cervical, and supraclavicular lymph nodes    Skin: No rashes   Neuro:  CN II-XII grossly intact (VII Movements of facial expression bilaterally intact and symmetrical)  Cardiovascular: Regular rate and rhythm, normal carotid pulses  Lungs: Clear to auscultation, normal effort  Extremities: No edema      Procedure note:  Diagnostic Flexible Fiberoptic Laryngoscopy (22025)  Consent obtained. Procedure discussed. Questions answered. Correct patient identified.    Epiglottis normal  Arytenoid bilateral no erythema, edema   Post-Cricoid no erythema, edema  False Vocal cord left fullness, yellow color  True Vocal cord bilateral normal vocal cord mobility, symmetric, patent airway  Pyriform sinus bilateral clear  Base of tongue  no hypertrophy   No masses, ulcers, pooling of secretions    Anesthesia: Lidophen  Patient tolerated procedure well.      IMPRESSION/PLAN: Hoarseness, left false vocal cord fullness  CT neck   Plan direct laryngoscopy with biopsy after CT      Kirke Shaggy, MD        Cc:   Tanner, Deitra Mayo, MD  Levora Angel, MD  [address]  [facility] [place] [address]

## 2014-08-13 NOTE — Patient Instructions (Signed)
Direct Laryngoscopy  Direct laryngoscopy is a procedure to look at the vocal cords. A laryngoscope is a rigid, hollow tube with a light attached. Using this [place] look behind your tongue and down your throatto your vocal cords. A tissue sample (biopsy) can be taken for study in a lab. Or a growth can be removed. Yourhealthcare providercan tell you more about your procedure depending on why it's being done. This sheet gives you general information about what to expect.    Getting ready for the procedure  Prepare for the surgery as you have been instructed. Be sure to tell yourhealthcare providerabout allmedicinesyou take. This includes over-the-counter medicines. It also includes herbs and other supplements. You may need to stop taking some or all of them before surgery. Yourhealthcare provider will let you know.Also follow any directions you're given for not eating or drinking before surgery.  The day of the procedure  The procedure takes 30to60 minutes. You will likely go home the same day.  Before the procedure  Here is what to expect before the procedure begins:   An IV line is put into a vein in your arm or hand. This line delivers fluids andmedicines.   You will be givenmedicine(anesthesia) to keep you pain free during surgery. This may be general anesthesia, which puts you in a state like deep sleep. Or you may have sedation, which makes you relaxed and drowsy. Local anesthesia may also be injected or sprayed into your throat to numb it.  During the procedure  Here is what to expect during the procedure:   You will lie on your back on a table. The scope is put into your mouth and passed down into the throat.   Your healthcare providerexamines the larynx and surrounding areas with the scope. If needed, a biopsy is done using small tools put through the scope.   If a growth is found, tools can be put through the scope to remove it.  After the procedure  You will  be taken to a room to wake up from the anesthesia. At first, your throat may be sore and scratchy. Your voice may be hoarse, making talking difficult. And it may be hard to swallow. You will receivemedicineto control pain. When you are released to go home, have an adult family member or friend ready to drive you.  Recovering at home  Once home, follow any instructions you have been given. Be sure to:   Take pain medicine as directed.   Drink plenty of water as soon as you can swallow normally.   Use throat lozenges as advised by your healthcare providerto help ease throat soreness.   Rest your voice as instructed by your healthcare provider.    Follow-up  Within a few weeks, you will see your healthcare providerfor a follow-up visit. During this visit, yourproviderwill discuss the results of the procedure. Depending on what was found, you may need further evaluation and treatment.     [address] [employer] [place], [place] [address]. All rights reserved. This information is not intended as a substitute for professional medical care. Always follow your healthcare professional's instructions.

## 2014-08-14 ENCOUNTER — Encounter (HOSPITAL_BASED_OUTPATIENT_CLINIC_OR_DEPARTMENT_OTHER): Payer: No Typology Code available for payment source | Admitting: Cardiovascular Disease

## 2014-08-14 ENCOUNTER — Telehealth (HOSPITAL_BASED_OUTPATIENT_CLINIC_OR_DEPARTMENT_OTHER): Payer: Self-pay | Admitting: Cardiovascular Disease

## 2014-08-14 NOTE — Progress Notes (Signed)
Mr. Warmuth did not cancel and was not present for a scheduled appointment today.  Disposition: Support staff notified and will follow up accordingly.

## 2014-08-14 NOTE — Telephone Encounter (Signed)
CONFIRMED PHONE NUMBER: [phone]  CALLERS FIRST AND LAST NAME: Zoe Lan  FACILITY NAME: / TITLE: /  CALLERS RELATIONSHIP:Self  RETURN CALL: Detailed message on voicemail only     ([place]) Texting is an option for this clinic. If you would like [place] to use this option, which mobile phone number should we text to if we are unable to reach you?    SUBJECT: Cancellation/Reschedule   REASON FOR CANCELLATION: Sick  RESCHEDULED: NO, Please Cancel per SOP CCR xfer to clinic for cancellation however it is after hours so again please cancel and please call Pt to reschedule

## 2014-08-20 ENCOUNTER — Telehealth (HOSPITAL_BASED_OUTPATIENT_CLINIC_OR_DEPARTMENT_OTHER): Payer: Self-pay

## 2014-08-20 ENCOUNTER — Ambulatory Visit: Payer: Self-pay

## 2014-08-20 NOTE — Telephone Encounter (Signed)
Pt reports difficulty breathing that is on-going.  Pt has a portable pulse oximeter at home.  Pt states that his O2 sats go down to 80% with exertion.  At rest, HR: 63, SO2: 90%    PMH: Two years ago Dx: low oxygen, pt was on home oxygen.    Pt has a productive cough.

## 2014-08-20 NOTE — Telephone Encounter (Signed)
Received [facility] nursing triage encounter recommending pt call 911 for difficulty breathing, low saturations, sob.  Called pt home hoping to find out and or leave VM for him or family member to call and let us know which ED he was going to so I could notify Dr. Maxcine Ham.  Pt answered call and when I asked him if he called 911 he stated "No I didn't call 911".  I asked why he didn't, and he stated, "I want an appt with Dr. Maxcine Ham".  I asked him again why he didn't call 911 as instructed and he stated, "Thank you very much" and promptly hung up on me.  I called him back and left VM that I needed more information as to what was going on in order to let Dr. Maxcine Ham know and to schedule him appropriately,   provided clinic call back number with option 8 to reach me.    Notified Dr. Maxcine Ham of above via email.

## 2014-08-20 NOTE — Telephone Encounter (Signed)
Protocol: BREATHING DIFFICULTY-ADULT-OH  Affirmative: SEVERE difficulty breathing (e.g., struggling for each breath, speaks in single words, pulse > 120)  Disposition of Call EMS 911 Now suggested.    Pt states that he is struggling for each breath.

## 2014-08-20 NOTE — Telephone Encounter (Signed)
Per RBWO from Dr. Maxcine Ham, called pt back and left VM that Dr. Maxcine Ham  feels he is pretty sick based upon the documentation from the [facility] and would like him to either go to the ED or see his PCP to be treated for this acute illness.  Once he's been treated she is happy to see him back in clinic.  Provided clinic call back number with option 8 to speak to me directly if he has further questions or concerns.

## 2014-08-22 ENCOUNTER — Ambulatory Visit (INDEPENDENT_AMBULATORY_CARE_PROVIDER_SITE_OTHER): Payer: No Typology Code available for payment source

## 2014-08-23 ENCOUNTER — Telehealth (INDEPENDENT_AMBULATORY_CARE_PROVIDER_SITE_OTHER): Payer: Self-pay | Admitting: Otolaryngology

## 2014-08-23 ENCOUNTER — Other Ambulatory Visit (INDEPENDENT_AMBULATORY_CARE_PROVIDER_SITE_OTHER): Payer: Self-pay | Admitting: Otolaryngology

## 2014-08-23 ENCOUNTER — Telehealth (HOSPITAL_BASED_OUTPATIENT_CLINIC_OR_DEPARTMENT_OTHER): Payer: Self-pay

## 2014-08-23 DIAGNOSIS — R918 Other nonspecific abnormal finding of lung field: Secondary | ICD-10-CM

## 2014-08-23 DIAGNOSIS — R490 Dysphonia: Secondary | ICD-10-CM

## 2014-08-23 NOTE — Telephone Encounter (Signed)
Tracy Conway states he is returning my call from the VM I left him on Monday.  He is requesting help with his breathing issues as he states he doesn't want to go to the ED. Today.  He states with mild exertion he becomes sob and his O2 saturations drops to mid 80's-upper 80's via his home pulse oximeter Iand he is sob.  He states he is coughing up a lot of secretions., denies fever.  States he uses portable Oxygen at noc and occassionally during the day.   Talking on phone pt voice sounds very raspy, speaking in full sentences.   He states he had a CT yesterday and has something on his vocal chords.  He states he is concerned about his sob with exertion and all the coughing and phem production he has.  He also mentions he doesn't believe the walk test results from 08/10/14 as the results don't seem to match his experience.  States he's never had a resting saturation of 99%.    He states he's trying to qualify for oxygen.    In speaking with the pt, he states he will go to the ED tomorrow as he is too busy with things he needs to do today.  I discussed the need for him to be seen by a doctor today to evaluate him for his worsening of symptoms: sob, cough, phlem. reported decrease on O2 sats with exertion and that he needs appropriate treatment as this cannot be managed over the phone. I explained that the doctor may want to do certain testing based upon the assessment such as CXR, labs etc but that needs to be determined by the doctor.   Reiterated to him Dr. Dani Gobble request that he seek care for this acute illness with either his PCP or ED and she would be happy to see him in follow-up.   Pt stated at end of conversation that he would call his PCP (Dr. Santiago Bumpers)  to try and be seen today and if not, agreed to come to the ED today.  I also notified him that if his PCP needed pulmonary consultation, she is welcomed to contact Dr. Maxcine Ham for assistance.  I also notified him that I would pass this information onto Dr.  Maxcine Ham so she is aware of our conversation.  He states appreciation for phone call and agrees to plan.       Notifying Dr. Maxcine Ham via email and this note.

## 2014-08-23 NOTE — Telephone Encounter (Signed)
CT neck 08/22/14:  Left lung apex mass 3.1 x 2.1 x 2 cm, patchy airspace disease medial LUL, symmetric vocal cords, normal airway, s/p left neck dissection      Ordered CT chest with [facility])  Consult placed for [facility] pulmonologist per Dr. Maxcine Ham  Consult placed for [facility]  laryngology - laryngeal stroboscopy procedure

## 2014-08-24 ENCOUNTER — Encounter (INDEPENDENT_AMBULATORY_CARE_PROVIDER_SITE_OTHER): Payer: Self-pay | Admitting: Family Medicine

## 2014-08-24 ENCOUNTER — Ambulatory Visit (INDEPENDENT_AMBULATORY_CARE_PROVIDER_SITE_OTHER): Payer: No Typology Code available for payment source | Admitting: Family Medicine

## 2014-08-24 VITALS — BP 101/61 | HR 70 | Temp 97.5°F | Resp 20

## 2014-08-24 DIAGNOSIS — J42 Unspecified chronic bronchitis: Secondary | ICD-10-CM

## 2014-08-24 DIAGNOSIS — R918 Other nonspecific abnormal finding of lung field: Secondary | ICD-10-CM

## 2014-08-24 DIAGNOSIS — J441 Chronic obstructive pulmonary disease with (acute) exacerbation: Secondary | ICD-10-CM

## 2014-08-24 DIAGNOSIS — N2589 Other disorders resulting from impaired renal tubular function: Secondary | ICD-10-CM

## 2014-08-24 MED ORDER — DOXYCYCLINE MONOHYDRATE 100 MG OR CAPS
100.0000 mg | ORAL_CAPSULE | Freq: Two times a day (BID) | ORAL | Status: DC
Start: 2014-08-24 — End: 2014-09-06

## 2014-08-24 NOTE — Progress Notes (Signed)
Reason for Visit: Told to be seen before next CT, O2, cough    Refills? NO  Referral? NO  Letter or Form? NO  Lab Results? NO    HEALTH MAINTENANCE:  Has the patient had this done since their last visit?  Cervical screening/PAP: N/A  Mammo: N/A  Colon Screen: Due    Have you seen a specialist since your last visit: Yes - Name, location, and date [place] Dr Augustin Coupe 07/2014, [facility]    Vaccines Due? No    HM Due:   Health Maintenance   Topic Date Due   . Colonoscopy  [birth year], 1934   . Zoster Vaccine  09/26/1996   . Cholesterol Test  06/07/2017   . Tetanus Vaccine  05/20/2020   . Pneumococcal Vaccine  Completed   . Influenza Vaccine  Completed       PCP Verified?  Yes, Dr Satira Sark

## 2014-08-24 NOTE — Progress Notes (Signed)
SUBJECTIVE  Chief Complaint:   Chief Complaint   Patient presents with   . Test Results     Recent CT, was told to be seen before the next CT       History of Present Illness:  Tracy Conway is a 80 year old male here to discuss the following:    1. Pulmonary Mass/SOB  -Was seen at ENT recently and CT neck yesterday showed upper lung lobe mass  -Scheduled for CT chest tomorrow  -Has been working with nurse at [place] [facility] to schedule appointment for further O2 needs evaluation  -Has portable home O2 at home  -Was on 2L NC until about 2 weeks ago and had visit at [facility] pulmonology who found resting sats at 99% and then 91% after ambulation  -Home levels at rest in high 90s  -No fevers at home, normal appetite  -Quit smoking in 2000      Review of Systems:  Constitutional: Negative  HEENT: Negative  Resp: Cough, SOB, increased O2 needs  CV: Negative  GI: Negative  GU: Negative  Skin: Negative      Problem List  Patient Active Problem List    Diagnosis Date Noted   . Pneumothorax on right [J93.9] 01/22/2014   . Right rib fracture [S22.31XA] 01/22/2014   . Falls [phone]] 10/26/2013     Inpatient evaluation September 2014, felt to have micturition syncope.  7/15 -- orthostatic hypotension sxs, BPs in afternoon in mid to high 90s to low 096E systolic, 45W to 09W diastolic. D/Ced Nifedipine.     Marland Kitchen History of non-ST elevation myocardial infarction (NSTEMI) [I25.2]      [place]       . History of alcohol dependence [F10.21]      [place]       . Carotid stenosis [I65.29]      [place]       . Depression [F32.9] 11/10/2012   . Lumbar stenosis with neurogenic claudication [M48.06] 02/05/2012     March 21, 2012, underwent L4-L5 bilateral laminectomies and partial medial facetectomies, as well as an L4-L5 right-sided synovial cyst resection. He says since surgery he feels better       . Coronary artery disease involving native coronary artery of native heart with angina pectoris [I25.119] 12/30/2010    Non-ST elevation myocardial infarction., July 18,2012  LAD drug-eluting stent nov, 2012. Clopidogrel  See [name], cardiology       . Malignant neoplasm of prostate [C61] 08/14/2010     Diagnosed 07/2010. Sees [name] (urol) and [name] (rad onc). Saw [name], [name]--[facility] [place]--palladium seed implants June 2012  Mr. [place] has an intermediate risk prostate cancer on the basis of a Gleason 7 histology       . Lumbago [M54.5] 12/19/2009     March 21, 2012, underwent L4-L5 bilateral laminectomies and partial medial facetectomies, as well as an L4-L5 right-sided synovial cyst resection. He says since surgery he feels better       . Routine general medical examination at a health care facility [Z00.00] 05/15/2009     06/07/2012  Td/Tdap (q 10 yr):  2004, 2012  Pneumococcal (65 yoa and high risk x1):  2010, 2004  Influenza vaccine (q 1 yr):  2010 nov, 2011, 2012, 2013  Shingles: discussed--should get at his local pharmacy  Blood pressure:  Excellent 138/64  121/72  Cholesterol (q 5 yr begin age 21):on simvastatin oct 2012: excellent--will  repeat  AAA screen (x1, male age 65, if ever smoked):  Checked at [place]-- abdo and thoracic CT--aug 2012: stable ascending aortic aneurysm  Colorectal cancer: ct colonoscopy done 2008, repeat due 2013--done April 2013: neg   PSA:  Sees urology for fu prostate ca. Has had palladium seed implants  Tobacco:  NS x 11 yrs, 50 pack yr hx  Exercise:  Reg--treadmill x 20-30 mins then weight machines--every other day! Has not been back to the gym because of the back pain  Alcohol /Drugs:  None in 26 yrs--relapse jan 2014--did inpatient treatment--now none in 37 days!  Sexual practice/contraception:  Not an issue  Advanced directives:  Does have living will. DPOA  done       . Generalized osteoarthrosis, unspecified site [M15.9] 01/07/2007     Has seen orthopedics--cortisone shot to L knee helped.  L knee replacement 2008     . DIVERTICULITIS OF COLON W/O  BLEED [K57.32] 01/07/2007     Hospitalized at [facility] May 2008     . TOBACCO USE DISORDER [Z72.0] 12/24/2005     Quit 2000.  50 pack year history  EtOH--quit 1986.     Marland Kitchen HEARING LOSS NOS [H91.90] 12/24/2005     Bilat hearing aids.     . ECZEMA  [L25.9] 12/24/2005     Lower extremities in particular.     Marland Kitchen ERECTILE DYSFUNCTION [N52.9] 12/24/2005   . BENIGN NEOPLASM LG BOWEL [D12.6] 12/24/2005     Tubular adenoma. Dx'ed flex sig 2000--> colonoscopy 2000: N.  F/U 2003: mild diverticulosis, tubular adenoma.  F/U due 2008 with CT colonography.--done 7/08: ess. N.  Next due 2013       . Calculus of kidney [N20.0] 12/24/2005     Lithotripsy 1988  Stone panel in 0ct 09: high oxalate. Given low oxalae diet by Dr. Princella [name], nephrology in 2009.       Marland Kitchen Panlobular emphysema [J43.1] 12/24/2005     05/2013: started by pulm on home O2  Emphysema: an FEV1 which is approximately at 33% of predicted    04/2010--Pulm: Chronic obstructive pulmonary disease, severe to very severe. I explained to him that continuing on the Spiriva as well as the Serevent was likely good therapy.I further told him that if he should have recurrent exacerbations within the course of the year that I would consider adding an inhaled corticosteroid at that point in time. He does not need oxygen at this point in time. He remains a nonsmoker. He is already engaged in a pulmonary rehabilitation.    Today I described to him how overall lung volume reduction surgery had no significant benefit on mortality. Subgroup analysis did indicate that those patients, who had upper lobe predominant disease with low exercise capacity were the most likely to benefit. Given the fact that he actually still has reasonably good functional status and is getting around and going to the gym and working out, it is not clear that he would be in the group that would most likely benefit, although, to safe for certain, we need to get a cardiopulmonary exercise test. At this point in time, he is  feeling good with his chronic obstructive pulmonary disease therapy and is not interested in pursuing lung volume reduction surgery.    05/20/2010 CT angio: Addendum: Increased scarring in the right lung base since the prior   exam including the interval development of a new nodule. The   appearance suggests that the patient has had a recent infection of  the right lower lobe has resolved and since the prior scan. However   recommend 3 months followup of the right lower lobe lesion given that   the patient is high risk with extensive paraseptal emphysema.     08/03/11 CT Chest  1. New indeterminate nodules in the lower lobes bilaterally, the largest   nodule measures 10 x 12 mm in the paraspinal location in the right lower lobe.  Possible considerations include infectious/inflammatory and less likely   metastasis.   2. Bilateral upper lobe predominant moderate centrilobular and paraseptal   emphysema.   3. Unchanged dilated main pulmonary artery measuring 32 mm may indicate   pulmonary arterial hypertension.   4. Aortic aneurysm involving the ascending aorta at the level of the main   pulmonary artery measuring 47 mm.     10/2011:   Impression:  1. Previous opacities resolved. New focus of small airways inflammation in the  RUL. No worrisome nodule or opacity for malignancy. If followup of the new   airways inflammation is desired a 12 month study will be adequate.  2. Stable fibrosis and peripheral calcification in the RLL, which raises the   possibility of chronic aspiration.                  . Thoracic aneurysm without mention of rupture [I71.2] 12/24/2005     Ascending aortic aneurysm.  Followed by Dr. Ralene Cork, Cardiac surgery.  Gets regular CT scans.  Goal systolic BP: 425-956  Also seeing [name] cardiology  Also has AAA     . AAA (abdominal aortic aneurysm) [I71.4] 12/24/2005     Father also had AAA. Borderline, seen on CT 8/06.  Also has thoracic aneurysm     . VENTRICULAR SEPT DEFECT [Q21.0] 12/24/2005      Small muscular VSD--NEEDS SBE PROPHYLAXIS. (Clindamycin 600 mg prior to dental visits.)     . HYPERTROPHY PROSTATE W/O OBST [N40.0] 12/24/2005   . Chronic Kidney Disease -- Stage III [N25.89] 07/22/2005     Creat ~ 1.8. Normal albuminuria (8/08)  Likely 2/2 HTN, poss component from hx obstructing nephrolithiasis.  8/08 Renal: referred to Uro for 17mm stone, nonobstructing. Also doing w/u for stone type, poss target prevention.     Marland Kitchen HYPERTENSION NOS(aka HTN) [I10] 03/29/2002   . Pure hypercholesterolemia [E78.0] 03/29/2002   . MALIG NEOPLASM DORSAL TONGUE(aka CANCER) [C02.0] 03/29/2002     [place], T1N1 squamous cell ca L tongue, partial glossectomy; L supraorbital hyoid neck dissection           Medication List  Outpatient Prescriptions Prior to Visit   Medication Sig Dispense Refill   . Acamprosate Calcium 333 MG Oral Tab EC Take 333 mg by mouth 3 times a day. 90 tablet 1   . Albuterol Sulfate HFA 108 (90 BASE) MCG/ACT Inhalation Aero Soln Inhale 2 puffs by mouth every 4 hours as needed for shortness of breath/wheezing. For wheezing and/or cough. 1 Inhaler 3   . Aspirin 81 MG Oral Tab Once daily     . Benazepril HCl 20 MG Oral Tab Take 1 tablet (20 mg) by mouth daily. 90 tablet 3   . Budesonide-Formoterol Fumarate 160-4.5 MCG/ACT Inhalation Aerosol Inhale 2 puffs by mouth every 12 hours. 3 Inhaler 4   . Citalopram Hydrobromide 40 MG Oral Tab Take 1 tablet (40 mg) by mouth daily.     . Fluticasone-Salmeterol (ADVAIR DISKUS) 250-50 MCG/DOSE Inhalation AEROSOL POWDER, BREATH ACTIVATED Inhale 1 puff by mouth 2 times a  day. 3 Inhaler 3   . Hydrochlorothiazide 25 MG Oral Tab Take 0.5 tablets (12.5 mg) by mouth daily. 45 tablet 3   . Lidocaine 5 % External Patch Apply 1 patch onto the skin every 24 hours. Remove patch(s) after 12 hours. 30 patch 6   . Metoprolol Succinate ER 100 MG Oral TABLET SR 24 HR Take 1 tablet (100 mg) by mouth daily. (Dose per discharge summary on 01/20/13) 90 tablet 3   . Multiple  Vitamins-Minerals (MULTIVITAMIN OR) Once daily     . NIFEdipine ER (AFEDITAB CR) 30 MG Oral TABLET SR 24 HR Take 1 tablet (30 mg) by mouth daily. 90 tablet 0   . Pantoprazole Sodium 40 MG Oral Tab EC Take 1 tablet (40 mg) by mouth every morning. 90 tablet 3   . Simvastatin 40 MG Oral Tab Take 1 tablet (40 mg) by mouth every evening. For cholesterol 90 tablet 3   . Tamsulosin HCl 0.4 MG Oral Cap Take 1 capsule (0.4 mg) by mouth at bedtime. 90 capsule 3   . Tiotropium Bromide Monohydrate (SPIRIVA HANDIHALER) 18 MCG Inhalation Cap Inhale the contents of 1 capsule via Handihaler once daily. After breathing out, inhale a second time. 90 capsule 2   . Triamcinolone Acetonide 0.1 % External Ointment Apply 1 application topically 2 times a day. Apply to affected areas bid 80 g 1     No facility-administered medications prior to visit.       OBJECTIVE  Physical Exam:  BP 101/61 mmHg  Pulse 70  Temp(Src) 97.5 F (36.4 C) (Temporal)  Resp 20  SpO2 89%   General:  Well nourished, well-developed.  Well dressed and groomed.  AOx 4.  HEENT:  NCAT, PERRL, EOMI  Lungs:  Clear to auscultation bilaterally. No wheezes. Good air flow.  Heart:  Normal S1 and S2 sounds.  No murmurs, rubs or gallops appreciated.  Skin:  No rashes, malignant or pre-malignant lesions seen.            ASSESSMENT/PLAN BY ISSUE:  (J44.1) COPD exacerbation  (primary encounter diagnosis)  Ayven appears to be having a mild to moderate COPD exacerbation with no wheezing. He is more short of breath and is hypoxic today in clinic. Recommend moving up action plan and starting abx course as well as restarting home O2 during the day. If symptoms do not improve or acutely worsen, would recommend course of prednisone 40mg  x 5 days.  Plan: Doxycycline Monohydrate 100 MG Oral Cap    (J42) Chronic bronchitis, unspecified chronic bronchitis type  (R91.8) Pulmonary mass  Patient recently had neck CT for chronic hoarseness which showed incidental upper pulmonary mass. CT  chest and FNA biopsy ordered by ENT provider at [facility]. New referrals placed to pulm today per patient request since he was advised that he needed a referral from PCP's office to move forward despite a referral in place by ENT. Radiology team at [facility] was contacted today and he is being scheduled for imaging in next few days.  Plan: REFERRAL TO PULMONOLOGIST    (R79.89) Elevated serum creatinine  Hx of elevated Cr at baseline, but last BMP was 9/15 in this system at was 1.29. Recommend repeat BMP today to categorize his CKD and determine possibility of contrast with scheduled CT chest.  Plan: BASIC METABOLIC PANEL      Follow-up: 1 week      Vance Gather Epps, DO

## 2014-08-24 NOTE — Patient Instructions (Addendum)
Start taking doxycycline 100mg  2x per day for 10 days.    Call [phone]    Get blood test today

## 2014-08-24 NOTE — Progress Notes (Signed)
------------------------------------------    Attending: Lockie Mola, MD  I saw and evaluated the patient with this resident.  I discussed the case with the resident.  Key findings based upon my evaluation and discussion:  Recent discovery of lung mass, and concern for hoarseness.  Known COPD.  Known increasingly productive cough.  Recent finding of possible lung mass; and hoarseness for several weeks.  Appears alert; VS notable for low O2 sat; lungs actually sound clear.  Agree with home O2; oral antibiotics for mild COPD exacerbation; already has chest CT arranged to further evaluate lung finding.  ----------------------------------------

## 2014-08-25 LAB — BASIC METABOLIC PANEL
Anion Gap: 11 (ref 4–12)
Calcium: 9.4 mg/dL (ref 8.9–10.2)
Carbon Dioxide, Total: 24 meq/L (ref 22–32)
Chloride: 99 meq/L (ref 98–108)
Creatinine: 1.9 mg/dL — ABNORMAL HIGH (ref 0.51–1.18)
GFR, Calc, African American: 42 mL/min — ABNORMAL LOW (ref >59)
GFR, Calc, European American: 35 mL/min — ABNORMAL LOW (ref >59)
Glucose: 94 mg/dL (ref 62–125)
Potassium: 4.6 meq/L (ref 3.6–5.2)
Sodium: 134 meq/L — ABNORMAL LOW (ref 135–145)
Urea Nitrogen: 35 mg/dL — ABNORMAL HIGH (ref 8–21)

## 2014-08-27 ENCOUNTER — Ambulatory Visit: Payer: No Typology Code available for payment source | Attending: Otolaryngology | Admitting: Otolaryngology

## 2014-08-27 ENCOUNTER — Other Ambulatory Visit (INDEPENDENT_AMBULATORY_CARE_PROVIDER_SITE_OTHER): Payer: Self-pay | Admitting: Otolaryngology

## 2014-08-27 ENCOUNTER — Telehealth (INDEPENDENT_AMBULATORY_CARE_PROVIDER_SITE_OTHER): Payer: Self-pay | Admitting: Family Medicine

## 2014-08-27 VITALS — BP 102/67 | HR 69 | Temp 97.5°F | Ht 70.0 in | Wt 217.0 lb

## 2014-08-27 DIAGNOSIS — R490 Dysphonia: Secondary | ICD-10-CM | POA: Insufficient documentation

## 2014-08-27 DIAGNOSIS — R918 Other nonspecific abnormal finding of lung field: Secondary | ICD-10-CM

## 2014-08-27 DIAGNOSIS — J387 Other diseases of larynx: Secondary | ICD-10-CM

## 2014-08-27 NOTE — Telephone Encounter (Signed)
Spoke with Gwyndolyn Saxon, relayed message from Dr Cathi Roan. States he understands and will await further instructions from Dr Lucianne Lei Epps/ Dr Augustin Coupe re; CT with contrast of Chest, (currently on hold )    He states he is "breathing easier" since abx started.     FYI Dr Cathi Roan.    Closing TE

## 2014-08-27 NOTE — Telephone Encounter (Signed)
Please call Mr. Zinni and check in to see how he is feeling after starting abx for a COPD exacerbation. Also, please let him know that his kidney function has worsened since the last value we had in Sept 2015. He won't be able to get his CT chest with contrast until we figure out a plan with the ENT and pulmonology teams. I have sent a message to Dr. Augustin Coupe (ENT) who ordered the CT chest and advised her that his creatinine increased to 1.9. Hopefully we will have an answer for him asap, but please don't get any contrast with imaging until we have this figured out.

## 2014-08-27 NOTE — Progress Notes (Signed)
This note was dictated by Meyer, Tanya K, MD.

## 2014-08-28 ENCOUNTER — Telehealth (INDEPENDENT_AMBULATORY_CARE_PROVIDER_SITE_OTHER): Payer: Self-pay | Admitting: Otolaryngology

## 2014-08-28 NOTE — Telephone Encounter (Signed)
CONFIRMED PHONE NUMBER: [phone]  CALLERS FIRST AND LAST NAME: Zoe Lan  FACILITY NAME: na TITLE: na  CALLERS RELATIONSHIP:Self  RETURN CALL: Detailed message on voicemail only     ([place]) Texting is an option for this clinic. If you would like [place] to use this option, which mobile phone number should we text to if we are unable to reach you?    SUBJECT: General Message   REASON FOR REQUEST: Patient wants to make sure that the chest CT that Dr. Augustin Coupe ordered, also gets shared with his pulmonary doctor at [facility]. Please forward the results to Dr. Maxcine Ham once they are received or notify his clinic of the results so he can view them as well.     MESSAGE: above

## 2014-08-29 ENCOUNTER — Telehealth (INDEPENDENT_AMBULATORY_CARE_PROVIDER_SITE_OTHER): Payer: Self-pay | Admitting: Otolaryngology

## 2014-08-29 NOTE — Progress Notes (Signed)
EEAN, BUSS          [id number]          08/27/2014      Tracy Conway's chief complaint, hoarseness and visualized laryngeal mass.    REFERRING PHYSICIAN     Kirke Shaggy.    HISTORY OF PRESENT ILLNESS     Tracy Conway is a delightful 80 year old male.  He has a history of tongue cancer that was treated by Dr. Susette Racer with a left lateral tongue resection supraomohyoid neck dissection over 20 years ago.  He has quit smoking for many years and he has never had radiation.  He has been free of disease since.    He does have known emphysema and has been on home O2.  [place] of [place], came back into this state, and was in the process of trying to get home O2 again when his voice changed approximately 3 months ago and became more hoarse.   He was eventually seen by Dr Augustin Coupe at [facility], who noticed a small supraglottic mass and he was sent for a neck CT, which did not show any neck disease, but did show a lung nodule and he is in the process of having back at worked up.  He does see Dr Erline Levine of pulmonary.   Aside from the hoarseness, he has no swallowing problems.  His breathing problems are stable, and as I said, he is not currently smoking.    PAST MEDICAL HISTORY     1. Is extensive, he has hypertension.  2. History of tongue cancer as above.  3. Stage III kidney disease.  4. Hearing loss, wearing bilateral hearing aids.  5. Emphysema.  6. Thoracic aneurysm.  7. Aortic aneurysm.  8. Prostatic hypertrophy.  9. Coronary artery disease.  10. History of myocardial infarction.  11. History of alcohol dependence.  12. Carotid stenosis.  13. Pneumothorax on the right.  14. Diverticulitis.    SOCIAL HISTORY     He is married.  He has 5 daughters that are living.  He is not currently smoking or drinking, although he does have a history of both in the past.    FAMILY HISTORY     Arthritis and cancer of the prostate in the father, Alzheimer disease in the mother.    REVIEW OF SYSTEMS     A complete  review of systems was performed and is positive for hoarseness, dyspnea, back pain.  All others are negative.    PHYSICAL EXAMINATION     GENERAL:  He is alert and oriented, in no apparent distress.  HEENT:  Normocephalic, atraumatic.  Sclerae anicteric.  Hearing aids bilaterally.  Extraocular motions intact.  Tympanic membranes are clear.  Oral cavity, he does have a plate and a scar on the left tongue, but otherwise, the tongue is soft and mobile.  Palate raises symmetrically.  Tonsils are not enlarged.  NECK:  Shows evidence of supraomohyoid left neck dissection, but there is no lymphadenopathy.  Laryngotracheal structures are midline.  CHEST:  Chest rise is symmetric.  EXTREMITIES:  No edema or tremor to the upper extremities.  His gait is normal.    To better evaluate the larynx, a fiberoptic laryngoscopy with stroboscopy was performed.    PROCEDURE     The flexible scope was placed in the nasopharynx.  Nasopharynx was clear.  Palate raised symmetrically.  Base of tongue, vallecula, epiglottis, and piriform sinuses without any lesion.  He had  good vocal fold motion, but on the right greater than the left, there was apparent ventricular polyps and this prevented good vocal cord closure good vibration of the true cords.  These appeared fairly smooth and did not appear malignant looking.  The cord below the polyp appears intact.  The visualized subglottis appears clear.  On the strobe light source the polyp prevents good mucosal vibration.    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  It is unusual for these to cause such a significant voice change, although certainly possible.  I think it would be worthwhile to biopsy them, especially given his history, but he needs certainly significant medical clearance before we do this and he is understanding of that.  He has a CT of the chest for further elucidation of the lung mass this Thursday and he has a pulmonologist that he is already  involved with to help him with the workup of that.  We will go ahead and get him scheduled for a medical clearance for evaluation under anesthesia in the operating room.  We discussed the procedure, risks, benefits, alternatives, and complications.

## 2014-08-30 ENCOUNTER — Ambulatory Visit: Payer: No Typology Code available for payment source | Attending: Otolaryngology

## 2014-08-30 ENCOUNTER — Encounter (INDEPENDENT_AMBULATORY_CARE_PROVIDER_SITE_OTHER): Payer: Self-pay | Admitting: Family Medicine

## 2014-08-30 DIAGNOSIS — R918 Other nonspecific abnormal finding of lung field: Secondary | ICD-10-CM

## 2014-08-30 DIAGNOSIS — J431 Panlobular emphysema: Secondary | ICD-10-CM

## 2014-08-30 DIAGNOSIS — R0902 Hypoxemia: Secondary | ICD-10-CM

## 2014-08-31 ENCOUNTER — Telehealth (INDEPENDENT_AMBULATORY_CARE_PROVIDER_SITE_OTHER): Payer: Self-pay | Admitting: Otolaryngology

## 2014-08-31 NOTE — Telephone Encounter (Signed)
Spoke with interventional radiologist Dr. Broadus John.      Chest CT on 5.5.16 shows lung mass to be mostly resolved.  Also, the patient has very advanced COPD, and would have a PTX post biopsy.  Will cancel lung FNA and recommend follow up CT in 3 months.    Patient has been notified of FNA cancellation by [facility] radiology.   He would like follow up with Dr. Bjorn Loser

## 2014-08-31 NOTE — Telephone Encounter (Signed)
[employer] at [phone]. Informed insurance that pt is requesting to reinstate or create a new order for oxygen usage in his home. Pt's oxygen was no longer needed according to oxygen evaluation done in home care. Pt has recently had a COPD exacerbation. Insurance is sending paperwork to get signed or completed by PCP.    Call Ref: #[id number]

## 2014-08-31 NOTE — Progress Notes (Signed)
------------------------------------------    Attending: Mouhamad Teed A Dene Landsberg, MD  I agree with the findings and plan as documented in the resident's note.  ----------------------------------------

## 2014-08-31 NOTE — Telephone Encounter (Signed)
Routing to Dr Cathi Roan to advise YO:MAYOKH below, patient still requesting 24/7 home machine sent to [facility]

## 2014-08-31 NOTE — Telephone Encounter (Signed)
Patient called and wanted to inform Dr. Cathi Roan that he does have a Portable O2 machine. He does not have the 24/7 Regino Ramirez which is what he is asking for.   He would like Dr. Satira Sark to send an Rx for O2 to [facility].  I advised the patient that a message was sent to Dr. Satira Sark about possibly seeing the patient earlier that 5/20 and he has agreed to keep his tentative appointment with Dr. Satira Sark but would like the Rx for O2 sent today.   Patient would like confirmation via eCare once Rx has been sent.

## 2014-08-31 NOTE — Telephone Encounter (Signed)
His insurance will require a formal reason to re-initiate continuous O2 at home and I saw him recently for an acute exacerbation which probable falls outside the long term need window. It would probably be worth while for someone in the clinic to talk to his insurance about what diagnoses qualify for home O2. Once this information is collected, it should be discussed with Dr. Satira Sark who can make a plan with Tracy Conway.

## 2014-08-31 NOTE — Telephone Encounter (Signed)
Dr Satira Sark next week 5/12 or 5/13 I only have 15 minute spots open at the end of your clinics, and patient should have 30 min.  Tentatively scheduled him for 5/20 at 11:30 AM but would you like to see him sooner/next week? I can overbook on either of the days with 15 min open

## 2014-08-31 NOTE — Telephone Encounter (Signed)
CT chest 08/30/14:  Significant improvement in nodular/consolidative process at the   medial left lung apex, presumably representing resolving rounded   pneumonia. However, residual small nodule remains.     Patient has FNA of lung nodule scheduled 5/9

## 2014-09-03 NOTE — Telephone Encounter (Signed)
On his last visit, his original O2 sat was 89% then ambulated down to 88% then dropped to 85% with resting then his O2 sat went back up. This is confirmed with Vicki Mallet MA and Lollie Marrow MA.    I signed a DME prescription for home O2 for him.  The prescription is in the MA inbox in pod 1  Can we try to get this processed?  thanks

## 2014-09-04 NOTE — Telephone Encounter (Signed)
Cassandra called from [place] requesting an update from [place]. Please call back at [phone], detailed VM ok.

## 2014-09-04 NOTE — Telephone Encounter (Signed)
Spoke with Vito Backers, case Freight forwarder at [employer]. She stated that the oxygen company that pt went through before was [employer] at [phone] (fax: [phone]). Called [facility] ([facility]) to ask what requirements they will need in order to get oxygen therapy for pt again. [facility] stated they needed all chart notes, scans/imaging from recent visits, and physician order from PCP. Sent 4/18 OV, 4/25 triage note, 4/28 TE for O2 levels, 4/29 OV, 5/2 OV and CT note from [facility]. Faxed to [facility] at [phone] (39 pages). Waiting for fax confirmation.

## 2014-09-05 ENCOUNTER — Ambulatory Visit: Payer: Self-pay

## 2014-09-05 NOTE — Telephone Encounter (Signed)
Protocol: NO PROTOCOL AVAILABLE - INFORMATION ONLY-ADULT-[place]  Affirmative: Nursing judgment  Disposition of Callback By PCP Within 1 Hour suggested.

## 2014-09-05 NOTE — Addendum Note (Signed)
Addended by: Oneta Rack F on: 09/05/2014 06:41 PM     Modules accepted: Disposition Section

## 2014-09-05 NOTE — Telephone Encounter (Signed)
-----   Message from Kristopher Glee sent at 09/05/2014  5:27 PM PDT -----  Regarding: Missing the liter flow on the Oxygen orders; this is critical per Provider  >> [facility] 09/05/2014 05:27 PM  Missing the liter flow on the Oxygen orders; this is critical per Provider

## 2014-09-05 NOTE — Telephone Encounter (Signed)
Fax did not go through for the [phone]. Called [facility] for an alternate fax number. Faxed to [phone]. Waiting for fax confirmation.

## 2014-09-05 NOTE — Telephone Encounter (Signed)
Protocol: NO CONTACT OR DUPLICATE CONTACT CALL-ADULT-AH  Affirmative: Message left on identified answering machine.  Disposition of No Contact Calls  suggested.

## 2014-09-05 NOTE — Telephone Encounter (Addendum)
Pt's oxygen order is missing a meter flow order as well as use (continuous, at night etc)    DME sales woman and   RT calling for specifics of oxygen orders.  Last order by Tracy Conway is incomplete and pt is home without oxygen now.    Paged family medicine on call res at [address] [address]. Rozann Lesches calling back and connected with the DME representative.

## 2014-09-05 NOTE — Telephone Encounter (Signed)
-----   Message from Kristopher Glee sent at 09/05/2014  6:10 PM PDT -----  Regarding: Missed Call - Missing the liter flow on the Oxygen orders; this is critical per Provider  >> Harriet [facility] 09/05/2014 06:10 PM  Missed Call - Missing the liter flow on the Oxygen orders; this is critical per Provider

## 2014-09-06 ENCOUNTER — Encounter (INDEPENDENT_AMBULATORY_CARE_PROVIDER_SITE_OTHER): Payer: Self-pay | Admitting: Family Medicine

## 2014-09-06 ENCOUNTER — Ambulatory Visit (INDEPENDENT_AMBULATORY_CARE_PROVIDER_SITE_OTHER): Payer: No Typology Code available for payment source | Admitting: Family Medicine

## 2014-09-06 ENCOUNTER — Telehealth (HOSPITAL_BASED_OUTPATIENT_CLINIC_OR_DEPARTMENT_OTHER): Payer: Self-pay

## 2014-09-06 VITALS — BP 99/61 | HR 56 | Temp 97.2°F | Resp 13 | Wt 211.0 lb

## 2014-09-06 DIAGNOSIS — R059 Cough, unspecified: Secondary | ICD-10-CM

## 2014-09-06 DIAGNOSIS — J431 Panlobular emphysema: Secondary | ICD-10-CM

## 2014-09-06 DIAGNOSIS — J449 Chronic obstructive pulmonary disease, unspecified: Secondary | ICD-10-CM

## 2014-09-06 DIAGNOSIS — E785 Hyperlipidemia, unspecified: Secondary | ICD-10-CM

## 2014-09-06 DIAGNOSIS — R05 Cough: Secondary | ICD-10-CM

## 2014-09-06 DIAGNOSIS — F329 Major depressive disorder, single episode, unspecified: Secondary | ICD-10-CM

## 2014-09-06 DIAGNOSIS — I10 Essential (primary) hypertension: Secondary | ICD-10-CM

## 2014-09-06 DIAGNOSIS — R21 Rash and other nonspecific skin eruption: Secondary | ICD-10-CM

## 2014-09-06 DIAGNOSIS — N401 Enlarged prostate with lower urinary tract symptoms: Secondary | ICD-10-CM

## 2014-09-06 DIAGNOSIS — K219 Gastro-esophageal reflux disease without esophagitis: Secondary | ICD-10-CM

## 2014-09-06 DIAGNOSIS — F1021 Alcohol dependence, in remission: Secondary | ICD-10-CM

## 2014-09-06 DIAGNOSIS — F32A Depression, unspecified: Secondary | ICD-10-CM

## 2014-09-06 MED ORDER — PANTOPRAZOLE SODIUM 40 MG OR TBEC
40.0000 mg | DELAYED_RELEASE_TABLET | Freq: Every morning | ORAL | Status: DC
Start: 2014-09-06 — End: 2015-07-25

## 2014-09-06 MED ORDER — METOPROLOL SUCCINATE ER 100 MG OR TB24
100.0000 mg | EXTENDED_RELEASE_TABLET | Freq: Every day | ORAL | Status: DC
Start: 2014-09-06 — End: 2015-09-07

## 2014-09-06 MED ORDER — CITALOPRAM HYDROBROMIDE 40 MG OR TABS
20.0000 mg | ORAL_TABLET | Freq: Every day | ORAL | Status: DC
Start: 2014-09-06 — End: 2014-10-02

## 2014-09-06 MED ORDER — BUDESONIDE-FORMOTEROL FUMARATE 160-4.5 MCG/ACT IN AERO
2.0000 | INHALATION_SPRAY | Freq: Two times a day (BID) | RESPIRATORY_TRACT | Status: DC
Start: 2014-09-06 — End: 2015-10-05

## 2014-09-06 MED ORDER — ACAMPROSATE CALCIUM 333 MG OR TBEC
333.0000 mg | DELAYED_RELEASE_TABLET | Freq: Three times a day (TID) | ORAL | Status: DC
Start: 2014-09-06 — End: 2014-10-16

## 2014-09-06 MED ORDER — TAMSULOSIN HCL 0.4 MG OR CAPS
0.4000 mg | ORAL_CAPSULE | Freq: Every evening | ORAL | Status: DC
Start: 2014-09-06 — End: 2015-10-05

## 2014-09-06 MED ORDER — NIFEDIPINE ER 30 MG OR TB24
30.0000 mg | EXTENDED_RELEASE_TABLET | Freq: Every day | ORAL | Status: DC
Start: 2014-09-06 — End: 2015-10-21

## 2014-09-06 MED ORDER — ACAMPROSATE CALCIUM 333 MG OR TBEC
333.0000 mg | DELAYED_RELEASE_TABLET | Freq: Three times a day (TID) | ORAL | Status: DC
Start: 2014-09-06 — End: 2014-09-06

## 2014-09-06 MED ORDER — SIMVASTATIN 40 MG OR TABS
40.0000 mg | ORAL_TABLET | Freq: Every evening | ORAL | Status: DC
Start: 2014-09-06 — End: 2015-04-16

## 2014-09-06 MED ORDER — TRIAMCINOLONE ACETONIDE 0.1 % EX OINT
1.0000 | TOPICAL_OINTMENT | Freq: Two times a day (BID) | CUTANEOUS | Status: DC
Start: 2014-09-06 — End: 2016-05-15

## 2014-09-06 MED ORDER — TIOTROPIUM BROMIDE MONOHYDRATE 18 MCG IN CAPS
ORAL_CAPSULE | RESPIRATORY_TRACT | Status: DC
Start: 2014-09-06 — End: 2015-09-21

## 2014-09-06 MED ORDER — ALBUTEROL SULFATE HFA 108 (90 BASE) MCG/ACT IN AERS
2.0000 | INHALATION_SPRAY | RESPIRATORY_TRACT | Status: DC | PRN
Start: 2014-09-06 — End: 2016-04-16

## 2014-09-06 NOTE — Progress Notes (Signed)
S:  Patient is here accompanied by his wife.  He is here to follow-up on a number of medical issues but primarily on the need for home oxygen.  He had been on home oxygen in the past when he was here and this was continued when he was living in [place].  He gets significantly short of breath even in his own home.  They have a 3 level townhome, and he gets short of breath and has to sit down even going up one short set of stairs.  No symptoms of fever or cough right now      On his last visit, his original O2 sat was 89% then ambulated down to 88% then dropped to 85% with resting then his O2 sat went back up. This is confirmed with Vicki Mallet MA and Lollie Marrow MA   Unfortunately they did not document those readings on that visit.    I had pt exercise in clinic today and O2 sat dropped to 86%  At rest was 93%  With oxygen and ambulation, O2 sat was 93%  These results are documented in the vitals section of the chart      Patient also would like a Medication for alcohol--he had been on acamprosate before and this seemed to help  He does mention that he recently had a CT scan of the chest and at that time creatinine was done and it was elevated at 1.9.  This corresponded to a GFR of approximately 35.    We had a long discussion regarding his use of alcohol  This interfered with his relationship with his daughter in [place] up the monotony of the day  However he can go many days in a row without imbibing  Binges  Only drinks wine  Thinks he needs somethng to get involved in that does not require a lot of physical activity, peferably away from the house  Meetings help, but he does not want to go to meetings all day long  His wife has suggested activities at the [facility] but he has not explored these yet  He contracted with her to at least go there on May 23 with her however he will try to go on his own there sooner    O:  Pt alert and cooperative and in NAD.  BP 99/61 mmHg  Pulse 56  Temp(Src) 97.2  F (36.2 C) (Temporal)  Resp 13  Wt 211 lb (95.709 kg)  SpO2 93%  Chest: Clear to bases bilaterally no crackles or wheezes were heard  He was significantly short of breath on walking in the clinic while we were doing the oxygen saturation test    A/P:  1) COPD--panlobular emphysema  His condition was improved when he was on oxygen in the past  He is clearly short of breath on normal activities O2 sat testing today reveals that he needs home oxygen  A prescription for home oxygen will be faxed to his insurance company and home oxygen provider  I would like to see him again in about 1 month's time    2) alcohol use disorder  I spent a total time of 30 minutes face-to-face with the patient, of which more than 50% was spent counseling as outlined in this note: Motivational interviewing regarding alcohol use  We will try the acamprosate  He will need another comprehensive metabolic panel drawn on his next visit    He requires refills of numerous medications these were  given to him today    (J43.1) Panlobular emphysema  (primary encounter diagnosis)     History of alcohol dependence  Plan: Acamprosate Calcium 333 MG Oral Tab EC,         DISCONTINUED: Acamprosate Calcium 333 MG Oral         Tab EC            (R05) Cough  Plan: Albuterol Sulfate HFA 108 (90 BASE) MCG/ACT         Inhalation Aero Soln            (J44.9) COPD  Plan: Budesonide-Formoterol Fumarate 160-4.5 MCG/ACT         Inhalation Aerosol, Tiotropium Bromide         Monohydrate (SPIRIVA HANDIHALER) 18 MCG         Inhalation Cap            (I10) Unspecified essential hypertension  Plan: Metoprolol Succinate ER 100 MG Oral TABLET SR         24 HR, NIFEdipine ER (AFEDITAB CR) 30 MG Oral         TABLET SR 24 HR            (E78.5) Other and unspecified hyperlipidemia  Plan: Simvastatin 40 MG Oral Tab            (R21) Rash  Plan: Triamcinolone Acetonide 0.1 % External Ointment            (F32.9) Depression  Plan: Citalopram Hydrobromide 40 MG Oral Tab             (K21.9) Gastroesophageal reflux disease without esophagitis  Plan: Pantoprazole Sodium 40 MG Oral Tab EC            (N40.1) Benign prostatic hyperplasia with lower urinary tract symptoms, prostatic enlargement of unspecified morphology  Plan: Tamsulosin HCl 0.4 MG Oral Cap            Levora Angel, MD              Blood test the next time

## 2014-09-06 NOTE — Progress Notes (Signed)
Reason for Visit: O2 issues    Refills? YES  Referral? NO  Letter or Form? YES  Lab Results? NO    HEALTH MAINTENANCE:  Has the patient had this done since their last visit?  Cervical screening/PAP: N/A  Mammo: N/A  Colon Screen: N/A    Have you seen a specialist since your last visit: No    Vaccines Due? Yes, Zoster, declined    HM Due:   Health Maintenance   Topic Date Due   . Colonoscopy  11/25/1934   . Zoster Vaccine  09/26/1996   . Cholesterol Test  06/07/2017   . Tetanus Vaccine  05/20/2020   . Pneumococcal Vaccine  Completed   . Influenza Vaccine  Completed       PCP Verified?  Yes, [place]

## 2014-09-06 NOTE — Telephone Encounter (Signed)
Faxed urgent orders for oxygen from [facility]. Orders were completed by Dr. Satira Sark while in clinic today. Faxed order and chart notes from today's OV to [facility] at [phone]. Fax was successful. Notified pt via ecare.

## 2014-09-06 NOTE — Telephone Encounter (Signed)
Called patient and updated medication list in EPIC & ORCA

## 2014-09-11 ENCOUNTER — Ambulatory Visit (HOSPITAL_BASED_OUTPATIENT_CLINIC_OR_DEPARTMENT_OTHER): Payer: No Typology Code available for payment source | Admitting: Cardiovascular Disease

## 2014-09-11 ENCOUNTER — Ambulatory Visit: Payer: No Typology Code available for payment source | Attending: Internal Medicine | Admitting: Internal Medicine

## 2014-09-11 VITALS — BP 108/54 | HR 64 | Ht 70.0 in | Wt 214.9 lb

## 2014-09-11 DIAGNOSIS — I252 Old myocardial infarction: Secondary | ICD-10-CM

## 2014-09-11 DIAGNOSIS — J431 Panlobular emphysema: Secondary | ICD-10-CM | POA: Insufficient documentation

## 2014-09-11 DIAGNOSIS — Z01818 Encounter for other preprocedural examination: Secondary | ICD-10-CM | POA: Insufficient documentation

## 2014-09-11 DIAGNOSIS — I10 Essential (primary) hypertension: Secondary | ICD-10-CM

## 2014-09-11 DIAGNOSIS — N2589 Other disorders resulting from impaired renal tubular function: Secondary | ICD-10-CM | POA: Insufficient documentation

## 2014-09-11 DIAGNOSIS — J381 Polyp of vocal cord and larynx: Secondary | ICD-10-CM | POA: Insufficient documentation

## 2014-09-11 NOTE — Patient Instructions (Addendum)
[place], [place] [address]  [id number]  [place] [address]    Phone [phone]  Fax [phone]    You saw the following physician at your visit today:  Ned Grace, MD      [facility]  Your medical history and how it affects your surgery was discussed with you today. Please note that we are consultants and that your surgeon remains your primary provider for this surgery. If you have questions about whether you will be scheduled for surgery or when your surgery will be, please contact your surgeon. If you have significant changes to your health, or new medications have been added to your current regimen between now and the time of surgery, or you have questions about what we discussed today including the medication recommendations below, you may contact our clinic or your surgeon. The best way to contact us is by the phone or fax number listed above. It will typically take at least 1 business day to respond. Please do not contact us through eCare as we may not immediately receive your message.    PERIOPERATIVE MEDICATION AND CARE RECOMMENDATIONS  Careful medication management before and after surgery can help reduce the risk of complications. We recommend the following:    >We will call you this week about an appointment for lung function tests  > I will talk with Dr. Bjorn Loser about what type of anesthesia you need for your biospy  > I will talk with Dr. Claybon Jabs about reducing your benzapril form 20 to 10mg  every day      > If you have the biopsy:  - stop aspirin 7 days before  -on the morning of surgery only take metoprolol, nifedipine, spiriva and symbicort

## 2014-09-12 ENCOUNTER — Telehealth (HOSPITAL_BASED_OUTPATIENT_CLINIC_OR_DEPARTMENT_OTHER): Payer: Self-pay | Admitting: Cardiovascular Disease

## 2014-09-12 NOTE — Telephone Encounter (Signed)
Per [name] will schedule patient June 14 at 11:30.

## 2014-09-12 NOTE — Telephone Encounter (Signed)
CONFIRMED PHONE NUMBER: [phone]  CALLERS FIRST AND LAST NAME: Tracy Conway   FACILITY NAME: NA TITLE: NA  CALLERS RELATIONSHIP:Self  RETURN CALL: Detailed message on voicemail only     ([place]) Texting is an option for this clinic. If you would like [place] to use this option, which mobile phone number should we text to if we are unable to reach you?    SUBJECT: Appointment Request   REASON FOR REQUEST: Patient was scheduled with [name] yesterday at 4pm, waited in the waiting room for one hour, and left because he was never called. Patient prefers to schedule with a cardiologist who is available in the morning. Currently, [name] only has afternoon slots. Please call back to assist with scheduling options. Thanks!    REQUEST APPOINTMENT WITH: Open  SYMPTOMS: Follow Up  REFERRING PROVIDER: NA  REQUESTED DATE: NA  REQUESTED TIME: AM is preferred  UNABLE TO APPOINT: Other: Patient is requesting to change established provider

## 2014-09-12 NOTE — Telephone Encounter (Signed)
Forwarded on to RN for review and to advise front desk if ok to proceed with transfer of care to another provider.

## 2014-09-14 ENCOUNTER — Encounter (INDEPENDENT_AMBULATORY_CARE_PROVIDER_SITE_OTHER): Payer: No Typology Code available for payment source | Admitting: Family Medicine

## 2014-09-15 NOTE — Progress Notes (Addendum)
Tracy Conway, Tracy Conway          [id number]          09/13/2014      OUTPATIENT MEDICAL CONSULTATION NOTE       DATE OF SERVICE     09/11/2014    REFERRING PROVIDER     Dr. Alesia Banda, Otolaryngology.    PRIMARY CARE PROVIDER     Santiago Bumpers, MD, [facility].    CARDIOLOGIST     Noel Christmas, MD    CHIEF COMPLAINT     Preoperative evaluation prior to laryngoscopy with vocal cord polyp biopsy.    HISTORY OF PRESENT ILLNESS     Dr. Bjorn Loser has requested an outpatient medical consultation on this 80 year old male with voice hoarseness and vocal cord polyps.  The patient is being evaluated for surgery and is not yet scheduled.  We are asked to help with risk stratification and perioperative advice concerning the patient's main medical problem, including coronary artery disease, COPD on oxygen, history of alcohol dependence, history of thoracic aneurysm, and obstructive sleep apnea.    ACTIVE AND PAST MEDICAL PROBLEMS     1. Bilateral vocal cord polyps.  Mr. Villella has had 1 year of voice hoarseness.  He states his voice gets worse as the day progresses.  He was referred to a local otolaryngologist, who noted polyps on his vocal cords and then referred him to Dr. Alesia Banda who performed a laryngoscopy in clinic, noted the polyps, and felt they were unlikely to be cancerous but could not definitely rule out cancer without biopsy.  She feels biopsy will need to be done in the operating room under general sedation due to the location of the polyps.  2. Chronic obstructive pulmonary disease.  The patient states he has a chronic heavy cough with white sputum.  Two to 3 weeks ago, he did have yellow sputum and was given antibiotics for pneumonia.  In the past, the patient has needed oral steroids for exacerbation but none for the last few years.  Currently, he is using Spiriva and Symbicort.  He uses albuterol at most once a day.  Since February 2014, he has been on 2 L during the day and at night.  The patient  states when he checks his oxygen level at rest on oxygen he is in the mid-90s, but with any sort of movement his oxygen drops down to the low 90s/upper 80s, and without oxygen during movement, his O2 saturation is in the mid to low 80s.  Currently, the patient cannot do more than 1 block or more than 5 steps without having to stop secondary to shortness of breath.  3. Lung nodule.  The patient was being followed by Dr. Kirke Shaggy for a lung nodule, initially noted on 08/22/2014 on a CT of the neck.  Repeat CT in May 2016 showed resolution of airspace disease in the medial left apex, and it was felt that the lung nodules was actually consolidation due to pneumonia.  The patient does have a small pleural-based residual nodule in the left lung apex measuring 1.2 cm.  4. History of tongue cancer, diagnosed in 1993.  The patient underwent curative resection without chemoradiation.  5. History of prostate cancer, treated with brachytherapy, currently in remission.  6. Hypertension, currently controlled with benazepril, nifedipine, HCTZ and metoprolol.  Per patient, he has recently been having episodes of dizziness, especially when on standing, and sometimes when he is standing for prolonged periods.  Also his  most recent BMP noted a creatinine increased from 1.2 to 1.9.  7. Hyperlipidemia, treated with simvastatin.  8. Chronic kidney disease, thought to be related to hypertension.  Baseline creatinine 1.2 but most recently on 08/24/2014 creatinine was 1.9.  9. Hard of hearing.  The patient wears hearing aids.  10. Thoracic aneurysm, approximately 4.4 cm, stable in size.  The current treatment is blood pressure control.  11. Benign prostatic hypertrophy.  The patient has decreased flow, but symptoms improved with tamsulosin.  12. Coronary artery disease, status post NSTEMI in 2012.  The patient had 6 drug-eluting stents placed in the circumflex and OM on 11/13/2010.  He then had 1 more stents placed in the LAD on  03/04/2011.  Currently denies any chest pain or palpitations with exertion.  13. Alcohol dependence.  The patient has had a long history of alcoholism.  He currently is attending AA but continues to drink off and on.  Last week, the patient states he had alcohol 1 or 2 days, and when he drinks, he drinks up to a liter of wine.  No hard alcohol use.  14. Depression, treated with citalopram.  15. History of gastroesophageal reflux disease, currently well controlled with pantoprazole.  16. Pneumothorax and pleural effusion, 2015, after a fall that resulted in rib fractures.  The patient needed a chest tube on the right side.  17. History of diverticulitis, hospitalized in 2008.  18. History of nephrolithiasis, received lithotripsy in 1987.  19. Osteoarthritis of the left knee and hips, status post total knee arthroplasty.  20. Obstructive sleep apnea.  The patient has a CPAP and used to use it, but currently does not use it, as he has O2.  Per the patient's wife, he does fall asleep frequently during the day and snores at night.  21. Small ventral septal defect.  The patient receives antibiotics with dental procedures.  22. Eczema on the extremities, uses as-needed triamcinolone.  23. Erectile dysfunction.  24. Lower extremity edema, chronic in nature, has not changed much in the last several years, improves with leg elevation.  Edema was noted in Medicine Consult note from 2012.  25. Chronic back pain.  The patient underwent L4-L5 surgery due to spinal stenosis in 2013.  26. Hemoptysis, 2013.  Underwent bronchoscopy, and no etiology was found.  Symptoms did not occur again.  27. Carotid stenosis noted on imaging in 2011, less than 70% stenosis in both internal carotid arteries.  28. History of narcotic abuse, currently not using any narcotics.  29. History of tobacco use disorder, quit 2000  30. Overweight    PAST SURGICAL HISTORY     1. L4-L5 decompression, 2013.  2. Left total knee arthroplasty.  3. Bilateral carpal  tunnel release.  4. Left great toe fusion.  5. Three abdominal hernia surgeries.  6. Bronchoscopy, 2013.  7. Chest tube placement, 2013.  8. Lithotripsy, 1987.  9. Stent placement in coronary artery, 2012.  10. Tongue cancer resection, 1993.    SURGICAL COMPLICATIONS     The patient had significant hypoxia post bronchoscopy in 2013.  Otherwise, he denies history of surgical or anesthetic complications.    MEDICATIONS     1. Acamprosate Calcium 230 mg p.o. three times a day.  2. Albuterol 2 puffs q. 4 hours for shortness of breath or wheezing.  3. Aspirin 81 mg p.o. daily.  4. Benazepril 20 mg p.o. daily.  5. Budesonide formoterol 160/4.5 mcg 2 puffs q.12 hours.  6. Citalopram 20 mg p.o.  daily.  7. Cyanocobalamin 100 mcg p.o. daily.  8. Hydrochlorothiazide 12.5 mg p.o. daily.  9. Metoprolol 100 mg p.o. daily.  10. Nifedipine 30 mg p.o. daily.  11. Pantoprazole 40 mg p.o. daily.  12. Simvastatin 40 mg p.o. at bedtime.  13. Tamsulosin 0.4 mg p.o. at bedtime.  14. Spiriva 1 capsule inhaled daily.  15. Triamcinolone 0.1% ointment as needed to skin.    ALLERGIES     PENICILLIN causes hives.  PERCOCET causes indigestion.    SOCIAL HISTORY     The patient is married.  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  Has a history of narcotic abuse, currently no narcotic usage.  Has struggled with alcohol abuse for a long time; currently attends AA but does drink a couple times a week up to a liter of wine.  History of tobacco use from age 58 in 47 until 2000 smoked 1 pack a day.    FAMILY HISTORY     Mother had throat cancer, died in 15 at age 57.  Father had prostate cancer, died at age 13.  Brother has prostate cancer, still alive.    REVIEW OF SYSTEMS     All systems reviewed and negative except as stated in medical history.    EXERCISE TOLERANCE     The patient has poor exercise tolerance and is unable to go more than 1 block or 5 stairs without stopping for  shortness of breath.    PHYSICAL EXAMINATION     VITAL SIGNS:  Height 5 feet 10 inches, weight 214 pounds, BMI 30.  Blood pressure 108/54, pulse 64, SpO2 96% on 2 L.  GENERAL:  Well-developed, well-nourished male sitting in chair, in no acute distress.  HEENT:  Oropharynx clear.  Normal dentition.  Wearing nasal cannula.  Eyes:  Wearing glasses.  Sclerae are anicteric.  Extraocular movements intact.  NECK:  Supple.  CARDIOVASCULAR:  Regular rate and rhythm.  No murmurs, rubs, or gallops. Ankle pitting edema 1+.  LUNGS:  Clear to auscultation without wheezing.  Prolonged I:E ratio.  Puffiness of lips when breathes.  ABDOMEN:  Bowel sounds present, soft, and nontender.  No hepatosplenomegaly.  Central obesity.  MUSCULOSKELETAL:  No joint erythema, effusion or swelling.  Normal muscle tone and bulk.  NEUROLOGIC:  Alert and oriented x3, no focal deficits.  Normal gait.  PSYCHIATRIC:  Appropriate mood and affect.  DERMATOLOGIC:  Tanned skin.  Otherwise, no rash noted.    LABORATORY STUDIES     08/24/2014:  Sodium 134, potassium 4.6, chloride 99, bicarbonate 24, glucose 94, BUN 35, creatinine 1.9, GFR about 30.  01/08/2014: White blood count 7.86, hemoglobin 9, hematocrit 27, platelets 234,000.  INR 1.2.    IMAGING     CT chest on 08/30/2014:   Airspace disease seen at the medial left apex compared to prior study has decreased in size, consistent with resolving rounded pneumonia.  Small pleural-based residual nodule is seen at the left apex, measuring 1.2 cm in diameter.  Severe emphysematous changes again seen with both central lobar and paraseptal components.  Scarring is seen along the lateral aspect of the right upper lobe and the right lung base.  Small hiatal hernia.  There is a shift of the mediastinum to the right.  Atherosclerotic calcifications are seen.  No mediastinal lymphadenopathy.  Well-circumscribed hypodense kidney lesions are seen bilaterally, likely benign cysts.  The visualized portion of the upper  abdomen is otherwise unremarkable, given  noncontrast technique.  Multiple old right rib fractures are seen.  Compression fractures of T7 and T11.    01/03/2013 echocardiogram, left ventricular chamber size is normal.  Global left ventricular wall thickness is mildly increased.  Global systolic left ventricular function is at lower limits of normal at 54%.  Hypokinesis of the distal one-half of the lateral wall.  A small restrictive muscular VSD is seen in the midseptum with high velocity, left to right flow seen from parasternal long window.  Overall diastolic pattern is one of impairment of left ventricular relaxation.  Left atrial volume index to body surface area is normal.  The right atrial size is mildly dilated.  The interatrial septum appears normal.  Right ventricular size is mildly enlarged.  The systolic function is normal.  The diameter of the aorta at the level of the sinuses of Valsalva is dilated at 4.1 cm.  The diameter of the aorta at the level of the sinotubular junction is in the upper limits of normal at 3.5 cm.  There ascending aorta is dilated at 4.4 cm.  The aortic valve is trileaflet and mildly thickened.  Aortic sclerosis is present but without stenosis.  The mitral valve leaflets are mildly thickened.  Trace mitral regurgitation is present.  Mild tricuspid regurgitation is present.  The pulmonic valve is normal.  Estimated PA pressures are in the upper limits of normal at 29-34 mmHg.  The IVC is normal, 0-5 mmHg.  No pericardial effusion seen.    Left heart catheterization on 03/04/2011 and 11/13/2010.    PFTs 2011:  FVC 3.19 L, or 67% predicted; FEV1 1.14 L, or 31% predicted; FEV1 to FVC 47%; moderate obstructive pulmonary impairment.    ASSESSMENT     Mr. Mende is a 80 year old male with multiple medical problems, including coronary artery disease, status post non-ST-segment elevation myocardial ischemia with stents, alcohol dependence, chronic obstructive pulmonary disease on oxygen,  history of tongue and prostate cancer, obstructive sleep apnea, and chronic kidney disease, who has had 1 year of voice hoarseness and now noted to have vocal cord polyps, who is undergoing evaluation prior to possible surgery for biopsy of these polyps.  The patient's risk for a major cardiac event using the Revised Cardiac Risk Index criteria is somewhere between 1 and 4%, as the patient does have a history of ischemic heart disease and his creatinine is approaching 2. Using the [facility] perioperative scale the patient's risk of major cardiac event is about 1.3%.  Thus, his risk for major cardiac event is elevated, and the patient is unable to perform 4 metabolic equivalents, though this is due to his pulmonary status.  The patient is on all the correct medications for his heart disease, and his blood pressure may actually be too well controlled, as noted by symptoms of orthostasis and elevated creatinine.  The patient's major risk is for pulmonary complications, including hypoxia, hypercarbia, pneumonia, and chronic obstructive pulmonary disease flare.  He is at risk of needing intensive care unit care and intubation postoperatively.  The patient's risk of delirium is high due to his age and multiple comorbidities.  His risk of acute kidney injury is elevated due to his chronic kidney disease and several comorbidities.    RECOMMENDATIONS     1. At this time, the patient needs no further cardiac testing.  2. I have asked the patient to obtain PFTs next week to get a further assessment of his current lung function.  3. I spoke with [name] about  the patient's blood pressure, and he was okay with the patient decreasing his benazepril from 20 to 10 mg daily in hopes of decreasing the patient's symptomatic orthostasis and helping his creatinine.  4. Recommend repeat creatinine check prior to surgery  5. The patient should avoid all NSAIDs.  Contrast should be avoided in patient if possible during the procedure and  postoperatively.  Potassium should be avoided in IV fluids.  The patient may need a renal diet.  Please renally dose all medications.  6. Recommend anesthesia consult prior to surgery.  7. Please make sure the patient has his hearing aids and glasses to decrease chance of delirium.  8. The patient is at risk for alcohol withdrawal and should be monitored for signs and symptoms of alcohol withdrawal.  9. Postoperatively, the patient should be monitored with 24-hour pulse oximetry to make sure he is not having hypoxia.  He will likely benefit from respiratory therapy, aggressive incentive spirometry and getting out of bed to sit in a chair or ambulate.  10. Minimize narcotic use due to patient's history of abuse and effect on respiratory system.  11. Defer DVT prophylaxis to primary team.    We thank Dr. Bjorn Loser for referring this patient to [facility].  If there are further questions, I can be reached my pager at [phone].     ADDENDUM 09/25/14:    Patient obtained PFTs 09/17/14:  FVC 2.98 or 68% predicted  FEV1 1.27L or 38% predicted  FEV1/FVC 43%FEF 25-75% 0.47L or 17% predicted  TLC 5.99 L or 85% predicted  RV 2.94L or 113% predicted  DLCo 31%  ABG 7.43/34/73/23 SaO2 95%, FiO2 21%    Spirometry results similar to 2011 but patient reporting worse exercise tolerance. FEV1/FVC <LLN and FVC <LLN suggesting some mixed obstructive and restrictive disease with severe pulmonary impairment by FEV1 of 38% predicted.   ABG shows respiratory alkalosis and would have suspected respiratory acidosis with COPD, but possibly patient was anxious or uncomfortable.    I spoke with Dr. Bjorn Loser two weeks ago and she stated patient's vocal cord polyps did not look cancerous and they could be visually monitored with laryngoscopy in clinic. Biopsy would require general anesthesia. Based on patient's poor exercise tolerance and since surveillance can be done in clinic, recommend holding off on surgery until absolutely medically  necessary. Also recommend patient follow up with pulmonary medicine on a yearly basis (last seen 05/31/13). I have put in a referral for him to re-establish care with [facility]. I have spoken to the patient and informed him about my recommendations and he was in agreement. I will update Dr. Bjorn Loser and Dr. Satira Sark (patient's PCP) about my recommendations.

## 2014-09-17 ENCOUNTER — Ambulatory Visit: Payer: No Typology Code available for payment source | Attending: Internal Medicine

## 2014-09-17 DIAGNOSIS — J431 Panlobular emphysema: Secondary | ICD-10-CM | POA: Insufficient documentation

## 2014-09-17 LAB — O2 SATURATION PULMONARY PANEL
Alveolar Arterial pO2 Gradient: 37 mmHg
Base Deficit Blood, ART: 1.5 meq/L (ref 0.0–2.0)
Bicarbonate: 23 meq/L (ref 22–26)
Carboxyhemoglobin, ART: 1.4 %
Fi (O2) [%]: 21 %
Methemoglobin, ART: 0.3 % (ref <3.0)
O2 Sat (Frac.), ART: 94 % (ref 94–99)
O2 Sat (Func.), ART: 95 %
Total Hemoglobin, ART: 12.5 g/dL — ABNORMAL LOW (ref 13.0–18.0)
pCO2, ART: 34 mmHg (ref 33–48)
pH, ART: 7.43 (ref 7.35–7.45)
pO2: 73 mmHg (ref 63–87)

## 2014-09-18 ENCOUNTER — Telehealth (INDEPENDENT_AMBULATORY_CARE_PROVIDER_SITE_OTHER): Payer: Self-pay | Admitting: Family Medicine

## 2014-09-18 NOTE — Telephone Encounter (Signed)
Received a fax from [facility] requesting PCP to complete the certificate of medical necessity for oxygen. PCP completed while in clinic. Faxed to [facility] at [phone]. Fax was successful. Placed in [place] file cabinet.

## 2014-09-25 ENCOUNTER — Encounter (INDEPENDENT_AMBULATORY_CARE_PROVIDER_SITE_OTHER): Payer: Self-pay | Admitting: Family Medicine

## 2014-09-25 NOTE — Telephone Encounter (Signed)
Spoke with Tracy Conway, he states he does not have any idea why he is falling, he doesn't think he is dizzy before hand he states it's more like he is walking along and "down he goes" "not sure if his leg gives out or what". Feels ok now, has an upcoming appt on the 15th. Will come in earlier if you think he needs to.

## 2014-09-25 NOTE — Addendum Note (Signed)
Addended byNed Grace on: 09/25/2014 10:46 AM     Modules accepted: Orders

## 2014-09-25 NOTE — Telephone Encounter (Signed)
See ecare from Mrs Seifer re; multiple falls    Dr Satira Sark, please advise

## 2014-09-27 NOTE — Telephone Encounter (Signed)
It would be good if we could squeeze him in earlier if that would work for him--do you think that you could find a spot?  That might not be [ossible.  The other idea is that I am attending in clinic all day Tuesday June 7. Can we get him scheduled with a resident on that day and then I can see him with a resident?  Thanks!

## 2014-09-27 NOTE — Telephone Encounter (Signed)
Spoke with [name].  He is will to come in on Tuesday to see Dr. Cathi Roan, per Dr. Mellissa Kohut recommendations.  We discussed if he continues falling frequently we would like him to go to urgent care or the emergency room.  He states agreement and understanding.

## 2014-10-02 ENCOUNTER — Ambulatory Visit (INDEPENDENT_AMBULATORY_CARE_PROVIDER_SITE_OTHER): Payer: No Typology Code available for payment source | Admitting: Family Medicine

## 2014-10-02 VITALS — BP 95/62 | HR 64 | Temp 96.1°F | Resp 20 | Wt 205.0 lb

## 2014-10-02 DIAGNOSIS — F3289 Other specified depressive episodes: Secondary | ICD-10-CM

## 2014-10-02 DIAGNOSIS — W19XXXA Unspecified fall, initial encounter: Secondary | ICD-10-CM

## 2014-10-02 DIAGNOSIS — F1021 Alcohol dependence, in remission: Secondary | ICD-10-CM

## 2014-10-02 DIAGNOSIS — Z9981 Dependence on supplemental oxygen: Secondary | ICD-10-CM

## 2014-10-02 DIAGNOSIS — F328 Other depressive episodes: Secondary | ICD-10-CM

## 2014-10-02 DIAGNOSIS — I952 Hypotension due to drugs: Secondary | ICD-10-CM

## 2014-10-02 NOTE — Patient Instructions (Signed)
Stop taking benazepril completely this week.    Follow up with Cardiology and Dr. Satira Sark next week    If you are getting really dizzy or falling again, you need to be seen immediately.

## 2014-10-02 NOTE — Progress Notes (Signed)
Reason for Visit: dizziness    Refills? NO  Referral? NO  Letter or Form? NO  Lab Results? NO    HEALTH MAINTENANCE:  Has the patient had this done since their last visit?  Cervical screening/PAP: N/A  Mammo: N/A  Colon Screen: Patient Declined    Have you seen a specialist since your last visit: No    Vaccines Due? No    HM Due:   Health Maintenance   Topic Date Due   . Colonoscopy  1934/09[birth year]17   . Zoster Vaccine  09/26/1996   . Cholesterol Test  06/07/2017   . Tetanus Vaccine  05/20/2020   . Pneumococcal Vaccine  Completed   . Influenza Vaccine  Completed       PCP Verified?  Yes, Tanner, Deitra Mayo, MD

## 2014-10-02 NOTE — Progress Notes (Signed)
I saw and evaluated the patient. I have reviewed the resident's documentation and agree with it.

## 2014-10-02 NOTE — Progress Notes (Signed)
SUBJECTIVE  Chief Complaint:   Chief Complaint   Patient presents with   . Dizziness     BP has been low lately, cut down the Benazepril   . Fall     couple times trying to walk, lightheaded, changed oxygen       History of Present Illness:  Tracy Conway is a 80 year old male here to discuss the following:    1. Dizziness  -Has been feeling light headed most of the time the day  -Only using O2 2L continously  -Has been shutting off when sitting and watching TV or reading  -If turned off when stands, gets light handed, but not so if O2 is on  -BP also dropping, so was advised to decrease Benazepril to 10mg  daily x 6 days  -[place] about 10 days ago onto driveway after sense of dizziness, was able to brace self  -Remembers entire fall with clarity  -Drinking about a quart or more of water per day    -Stopped taking citalopram months ago    -Orthostatics today with 20 point drop in systolic pressure from sitting to standing    No etoh in past week --> feels as if no longer desires (daily AA meetings)  -some mild shaking the day after stopping, but okay now  No tobacco  No drugs  Takes baby asa, B12, and multivitamin  About 1 cup of coffee per day    Sep 19, 2014  Hemoglobin: 12.5  Cr: 1.9    Review of Systems:  Constitutional: Negative  HEENT: Negative  Resp: Negative  CV: Negative  GI: Negative  GU: Negative  Skin: Facial bruising and abrasion  Rheum/MSK: Negative  Neuro: Light headedness and fall    Problem List  Patient Active Problem List    Diagnosis Date Noted   . Pneumothorax on right [J93.9] 01/22/2014   . Right rib fracture [S22.31XA] 01/22/2014   . Falls [phone]] 10/26/2013     Inpatient evaluation September 2014, felt to have micturition syncope.  7/15 -- orthostatic hypotension sxs, BPs in afternoon in mid to high 90s to low 299B systolic, 71I to 96V diastolic. D/Ced Nifedipine.     Marland Kitchen History of non-ST elevation myocardial infarction (NSTEMI) [I25.2]      [place]       . History of alcohol  dependence ([place]) [F10.21]      [place] [facility]       . Carotid stenosis [I65.29]      [place]       . Depression [F32.9] 11/10/2012   . Lumbar stenosis with neurogenic claudication [M48.06] 02/05/2012     March 21, 2012, underwent L4-L5 bilateral laminectomies and partial medial facetectomies, as well as an L4-L5 right-sided synovial cyst resection. He says since surgery he feels better       . Coronary artery disease involving native coronary artery of native heart with angina pectoris ([place]) [I25.119] 12/30/2010     Non-ST elevation myocardial infarction., July 18,2012  LAD drug-eluting stent nov, 2012. Clopidogrel  See [name], cardiology       . Malignant neoplasm of prostate ([place]) [C61] 08/14/2010     Diagnosed 07/2010. Sees [name] (urol) and [name] (rad onc). Saw [name], [name]--[facility] [place]--palladium seed implants June 2012  [name] has an intermediate risk prostate cancer on the basis of a Gleason 7 histology       . Lumbago [M54.5] 12/19/2009  March 21, 2012, underwent L4-L5 bilateral laminectomies and partial medial facetectomies, as well as an L4-L5 right-sided synovial cyst resection. He says since surgery he feels better       . Routine general medical examination at a health care facility [Z00.00] 05/15/2009     06/07/2012  Td/Tdap (q 10 yr):  2004, 2012  Pneumococcal (65 yoa and high risk x1):  2010, 2004  Influenza vaccine (q 1 yr):  2010 nov, 2011, 2012, 2013  Shingles: discussed--should get at his local pharmacy  Blood pressure:  Excellent 138/64  121/72  Cholesterol (q 5 yr begin age 73):on simvastatin oct 2012: excellent--will repeat  AAA screen (x1, male age 15, if ever smoked):  Checked at [facility]-- abdo and thoracic CT--aug 2012: stable ascending aortic aneurysm  Colorectal cancer: ct colonoscopy done 2008, repeat due 2013--done April 2013: neg   PSA:  Sees urology for fu prostate ca. Has had palladium seed implants  Tobacco:  NS x 11 yrs, 50  pack yr hx  Exercise:  Reg--treadmill x 20-30 mins then weight machines--every other day! Has not been back to the gym because of the back pain  Alcohol /Drugs:  None in 26 yrs--relapse jan 2014--did inpatient treatment--now none in 37 days!  Sexual practice/contraception:  Not an issue  Advanced directives:  Does have living will. DPOA  done       . Generalized osteoarthrosis, unspecified site [M15.9] 01/07/2007     Has seen orthopedics--cortisone shot to L knee helped.  L knee replacement 2008     . Diverticulitis of colon (without mention of hemorrhage) [K57.32] 01/07/2007     Hospitalized at [facility] May 2008     . Tobacco use disorder [Z72.0] 12/24/2005     Quit 2000.  50 pack year history  EtOH--quit 1986.     Marland Kitchen Unspecified hearing loss [H91.90] 12/24/2005     Bilat hearing aids.     . ECZEMA  [L25.9] 12/24/2005     Lower extremities in particular.     Marland Kitchen ERECTILE DYSFUNCTION [N52.9] 12/24/2005   . Benign neoplasm of colon [D12.6] 12/24/2005     Tubular adenoma. Dx'ed flex sig 2000--> colonoscopy 2000: N.  F/U 2003: mild diverticulosis, tubular adenoma.  F/U due 2008 with CT colonography.--done 7/08: ess. N.  Next due 2013       . Calculus of kidney [N20.0] 12/24/2005     Lithotripsy 1988  Stone panel in 0ct 09: high oxalate. Given low oxalae diet by Dr. Princella [name], nephrology in 2009.       Marland Kitchen Panlobular emphysema ([place]) [J43.1] 12/24/2005     05/2013: started by pulm on home O2  Emphysema: an FEV1 which is approximately at 33% of predicted    04/2010--Pulm: Chronic obstructive pulmonary disease, severe to very severe. I explained to him that continuing on the Spiriva as well as the Serevent was likely good therapy.I further told him that if he should have recurrent exacerbations within the course of the year that I would consider adding an inhaled corticosteroid at that point in time. He does not need oxygen at this point in time. He remains a nonsmoker. He is already engaged in a pulmonary rehabilitation.    Today I  described to him how overall lung volume reduction surgery had no significant benefit on mortality. Subgroup analysis did indicate that those patients, who had upper lobe predominant disease with low exercise capacity were the most likely to benefit. Given the fact that he actually still has reasonably good functional  status and is getting around and going to the gym and working out, it is not clear that he would be in the group that would most likely benefit, although, to safe for certain, we need to get a cardiopulmonary exercise test. At this point in time, he is feeling good with his chronic obstructive pulmonary disease therapy and is not interested in pursuing lung volume reduction surgery.    05/20/2010 CT angio: Addendum: Increased scarring in the right lung base since the prior   exam including the interval development of a new nodule. The   appearance suggests that the patient has had a recent infection of   the right lower lobe has resolved and since the prior scan. However   recommend 3 months followup of the right lower lobe lesion given that   the patient is high risk with extensive paraseptal emphysema.     08/03/11 CT Chest  1. New indeterminate nodules in the lower lobes bilaterally, the largest   nodule measures 10 x 12 mm in the paraspinal location in the right lower lobe.  Possible considerations include infectious/inflammatory and less likely   metastasis.   2. Bilateral upper lobe predominant moderate centrilobular and paraseptal   emphysema.   3. Unchanged dilated main pulmonary artery measuring 32 mm may indicate   pulmonary arterial hypertension.   4. Aortic aneurysm involving the ascending aorta at the level of the main   pulmonary artery measuring 47 mm.     10/2011:   Impression:  1. Previous opacities resolved. New focus of small airways inflammation in the  RUL. No worrisome nodule or opacity for malignancy. If followup of the new   airways inflammation is desired a 12 month study will be  adequate.  2. Stable fibrosis and peripheral calcification in the RLL, which raises the   possibility of chronic aspiration.                  . Thoracic aneurysm without mention of rupture [I71.2] 12/24/2005     Ascending aortic aneurysm.  Followed by Dr. Ralene Cork, Cardiac surgery.  Gets regular CT scans.  Goal systolic BP: 664-403  Also seeing [name] cardiology  Also has AAA     . AAA (abdominal aortic aneurysm) ([place]) [I71.4] 12/24/2005     Father also had AAA. Borderline, seen on CT 8/06.  Also has thoracic aneurysm     . Ventricular septal defect [Q21.0] 12/24/2005     Small muscular VSD--NEEDS SBE PROPHYLAXIS. (Clindamycin 600 mg prior to dental visits.)     . Hypertrophy of prostate without urinary obstruction and other lower urinary tract symptoms (LUTS) [N40.0] 12/24/2005   . Chronic Kidney Disease -- Stage III [N25.89] 07/22/2005     Creat ~ 1.8. Normal albuminuria (8/08)  Likely 2/2 HTN, poss component from hx obstructing nephrolithiasis.  8/08 Renal: referred to Uro for 71mm stone, nonobstructing. Also doing w/u for stone type, poss target prevention.     Marland Kitchen HYPERTENSION NOS(aka HTN) [I10] 03/29/2002   . Pure hypercholesterolemia [E78.0] 03/29/2002   . MALIG NEOPLASM DORSAL TONGUE(aka CANCER) [C02.0] 03/29/2002     [place], T1N1 squamous cell ca L tongue, partial glossectomy; L supraorbital hyoid neck dissection           Medication List  Outpatient Prescriptions Prior to Visit   Medication Sig Dispense Refill   . Acamprosate Calcium 333 MG Oral Tab EC Take 333 mg by mouth 3 times a day. 90 tablet 1   . Albuterol  Sulfate HFA 108 (90 BASE) MCG/ACT Inhalation Aero Soln Inhale 2 puffs by mouth every 4 hours as needed for shortness of breath/wheezing. For wheezing and/or cough. 1 Inhaler 12   . Aspirin 81 MG Oral Tab 1 tablet by mouth once daily     . Benazepril HCl 20 MG Oral Tab Take 1 tablet (20 mg) by mouth daily. 90 tablet 3   . Budesonide-Formoterol Fumarate 160-4.5 MCG/ACT Inhalation Aerosol Inhale  2 puffs by mouth every 12 hours. 3 Inhaler 4   . Citalopram Hydrobromide 40 MG Oral Tab Take 0.5 tablets (20 mg) by mouth daily. 45 tablet 12   . Cyanocobalamin (B-12) 100 MCG Oral Tab Take 100 mcg by mouth.     . Hydrochlorothiazide 25 MG Oral Tab Take 0.5 tablets (12.5 mg) by mouth daily. 45 tablet 3   . Lidocaine 5 % External Patch Apply 1 patch onto the skin every 24 hours. Remove patch(s) after 12 hours. 30 patch 6   . Metoprolol Succinate ER 100 MG Oral TABLET SR 24 HR Take 1 tablet (100 mg) by mouth daily. (Dose per discharge summary on 01/20/13) 90 tablet 3   . Multiple Vitamins-Minerals (MULTIVITAMIN OR) Once daily     . NIFEdipine ER (AFEDITAB CR) 30 MG Oral TABLET SR 24 HR Take 1 tablet (30 mg) by mouth daily. 90 tablet 4   . Pantoprazole Sodium 40 MG Oral Tab EC Take 1 tablet (40 mg) by mouth every morning. 90 tablet 3   . Simvastatin 40 MG Oral Tab Take 1 tablet (40 mg) by mouth every evening. For cholesterol 90 tablet 3   . Tamsulosin HCl 0.4 MG Oral Cap Take 1 capsule (0.4 mg) by mouth at bedtime. 90 capsule 3   . Tiotropium Bromide Monohydrate (SPIRIVA HANDIHALER) 18 MCG Inhalation Cap Inhale the contents of 1 capsule via Handihaler once daily. After breathing out, inhale a second time. 90 capsule 2   . Triamcinolone Acetonide 0.1 % External Ointment Apply 1 application topically 2 times a day. Apply to affected areas bid 80 g 1     No facility-administered medications prior to visit.       OBJECTIVE  Physical Exam:  BP 95/62 mmHg  Pulse 64  Temp(Src) 96.1 F (35.6 C) (Temporal)  Resp 20  Wt 205 lb (92.987 kg)  SpO2 96%   General:  Well nourished, well-developed.  Well dressed and groomed.  AOx 4.  HEENT:  NCAT, PERRL, EOMI  Lungs:  Clear to auscultation bilaterally.  No wheezes or crackles.   Heart:  Normal S1 and S2 sounds.  No murmurs, rubs or gallops appreciated. No JVD.  Extremities:  Warm and well-perfused.  No LE edema seen. No cyanosis.  Neuro: CN II-XII intact bilaterally. Mildly  unsteady gait. Normal finger to nose. Sensation intact in bilateral UE/LE.  Skin: Mild glasses shaped contusion of left orbit    ASSESSMENT/PLAN BY ISSUE:  (I95.2) Hypotension due to drugs  (primary encounter diagnosis)  [id number]) Fall, initial encounter  (Z99.81) Supplemental oxygen dependent  Mr. Rihn has been experiencing daily light headedness while standing and walking. He also had a fall onto his face and outstretched arms about 10 days, but has been recovering well. Mild bruising and bleeding during that time noted today. Neuro exam intact. Most likely cause of dizziness from low blood pressure which could be directly related to the number of anti-htn meds he is taking. Recommend he completely stop his benazepril today and can see how he feels over  the course of the week until his schedule f/u with cardiology next week. Strict return precautions given today.    (F32.8) Other depression  (F10.21) History of alcohol dependence ([place])  Seems to be improving and didn't notice much change after stopping citalopram months ago. No longer drinking as well which has improved his mood and he is a positive sense that he will be done drinking for good this time. Going to [employer] daily.      Follow-up: 1 week with cardiology and Dr. Winifred Olive, DO

## 2014-10-08 ENCOUNTER — Encounter (INDEPENDENT_AMBULATORY_CARE_PROVIDER_SITE_OTHER): Payer: No Typology Code available for payment source | Admitting: Family Medicine

## 2014-10-09 ENCOUNTER — Ambulatory Visit
Payer: No Typology Code available for payment source | Attending: Cardiovascular Disease | Admitting: Cardiovascular Disease

## 2014-10-09 ENCOUNTER — Encounter (HOSPITAL_BASED_OUTPATIENT_CLINIC_OR_DEPARTMENT_OTHER): Payer: Self-pay | Admitting: Cardiovascular Disease

## 2014-10-09 VITALS — BP 118/70 | HR 56 | Ht 70.0 in | Wt 205.0 lb

## 2014-10-09 DIAGNOSIS — I712 Thoracic aortic aneurysm, without rupture, unspecified: Secondary | ICD-10-CM

## 2014-10-09 DIAGNOSIS — I714 Abdominal aortic aneurysm, without rupture, unspecified: Secondary | ICD-10-CM

## 2014-10-09 DIAGNOSIS — E7800 Pure hypercholesterolemia, unspecified: Secondary | ICD-10-CM

## 2014-10-09 DIAGNOSIS — R0600 Dyspnea, unspecified: Secondary | ICD-10-CM

## 2014-10-09 DIAGNOSIS — R0689 Other abnormalities of breathing: Secondary | ICD-10-CM | POA: Insufficient documentation

## 2014-10-09 DIAGNOSIS — I25119 Atherosclerotic heart disease of native coronary artery with unspecified angina pectoris: Secondary | ICD-10-CM

## 2014-10-09 DIAGNOSIS — I10 Essential (primary) hypertension: Secondary | ICD-10-CM

## 2014-10-09 LAB — BASIC METABOLIC PANEL
Anion Gap: 8 (ref 4–12)
Calcium: 10.4 mg/dL — ABNORMAL HIGH (ref 8.9–10.2)
Carbon Dioxide, Total: 25 meq/L (ref 22–32)
Chloride: 103 meq/L (ref 98–108)
Creatinine: 1.75 mg/dL — ABNORMAL HIGH (ref 0.51–1.18)
GFR, Calc, African American: 46 mL/min — ABNORMAL LOW (ref >59)
GFR, Calc, European American: 38 mL/min — ABNORMAL LOW (ref >59)
Glucose: 101 mg/dL (ref 62–125)
Potassium: 4.3 meq/L (ref 3.6–5.2)
Sodium: 136 meq/L (ref 135–145)
Urea Nitrogen: 32 mg/dL — ABNORMAL HIGH (ref 8–21)

## 2014-10-09 LAB — B_TYPE NATRIURETIC PEPTIDE: B_Type Natriuretic Peptide: 570 pg/mL — ABNORMAL HIGH (ref <101)

## 2014-10-10 ENCOUNTER — Encounter (INDEPENDENT_AMBULATORY_CARE_PROVIDER_SITE_OTHER): Payer: Self-pay | Admitting: Family Medicine

## 2014-10-10 ENCOUNTER — Ambulatory Visit (INDEPENDENT_AMBULATORY_CARE_PROVIDER_SITE_OTHER): Payer: No Typology Code available for payment source | Admitting: Family Medicine

## 2014-10-10 ENCOUNTER — Telehealth (HOSPITAL_BASED_OUTPATIENT_CLINIC_OR_DEPARTMENT_OTHER): Payer: Self-pay

## 2014-10-10 ENCOUNTER — Telehealth (INDEPENDENT_AMBULATORY_CARE_PROVIDER_SITE_OTHER): Payer: Self-pay | Admitting: Family Medicine

## 2014-10-10 ENCOUNTER — Telehealth (HOSPITAL_BASED_OUTPATIENT_CLINIC_OR_DEPARTMENT_OTHER): Payer: Self-pay | Admitting: Internal Medicine

## 2014-10-10 VITALS — BP 144/69 | HR 65 | Temp 97.2°F | Resp 16 | Wt 204.1 lb

## 2014-10-10 DIAGNOSIS — F321 Major depressive disorder, single episode, moderate: Secondary | ICD-10-CM

## 2014-10-10 DIAGNOSIS — J431 Panlobular emphysema: Secondary | ICD-10-CM

## 2014-10-10 DIAGNOSIS — I10 Essential (primary) hypertension: Secondary | ICD-10-CM

## 2014-10-10 NOTE — Progress Notes (Signed)
S:  Pt is here for follow up, he is accompanied by his wife [name].  Doing pretty well:  No falls no dizziiness no etoh!  No etoh for 2 weeks  Off benazepril altogether x 1 week because BP was quite low when he was last seen in clinic by Dr. Lucianne Lei Epps  BP at home--yesterday 124/71,  saw [name] recently and BP was 121/70  Had BNP drawn yesterday--was elevated at 570 but this is lower than it was in Sept 2015  BMP done yesterday showed N electrolytes. Stable creat    Re mood:  He does not feel depressed.  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 Falls [phone]] 10/26/2013     Inpatient evaluation September 2014, felt to have micturition syncope.  7/15 -- orthostatic hypotension sxs, BPs in afternoon in mid to high 90s to low 035K systolic, 09F to 81W diastolic. D/Ced Nifedipine.     . Coronary artery disease involving native coronary artery of native heart with angina pectoris ([place]) [I25.119] 12/30/2010     Non-ST elevation myocardial infarction., July 18,2012  LAD drug-eluting stent nov, 2012. Clopidogrel  See [name], cardiology       . Malignant neoplasm of prostate ([place]) [C61] 08/14/2010     Diagnosed 07/2010. Sees [name] (urol) and [name] (rad onc). Saw [name], [name]--[facility] [place]--palladium seed implants June 2012  Mr. Badeaux has an intermediate risk prostate cancer on the basis of a Gleason 7 histology       . Lumbago [M54.5] 12/19/2009     March 21, 2012, underwent L4-L5 bilateral laminectomies and partial medial facetectomies, as  well as an L4-L5 right-sided synovial cyst resection. He says since surgery he feels better       . Routine general medical examination at a health care facility [Z00.00] 05/15/2009     06/07/2012  Td/Tdap (q 10 yr):  2004, 2012  Pneumococcal (65 yoa and high risk x1):  2010, 2004  Influenza vaccine (q 1 yr):  2010 nov, 2011, 2012, 2013  Shingles: discussed--should get at his local pharmacy  Blood pressure:  Excellent 138/64  121/72  Cholesterol (q 5 yr begin age 19):on simvastatin oct 2012: excellent--will repeat  AAA screen (x1, male age 60, if ever smoked):  Checked at [facility]-- abdo and thoracic CT--aug 2012: stable ascending aortic aneurysm  Colorectal cancer: ct colonoscopy done 2008, repeat due 2013--done April 2013: neg   PSA:  Sees urology for fu prostate ca. Has had palladium seed implants  Tobacco:  NS x 11 yrs, 50 pack yr hx  Exercise:  Reg--treadmill x 20-30 mins then weight machines--every other day! Has not been back to the gym because of the back pain  Alcohol /Drugs:  None in 26 yrs--relapse jan 2014--did inpatient treatment--now none in 37 days!  Sexual practice/contraception:  Not an issue  Advanced directives:  Does have living will. DPOA  done       .  Generalized osteoarthrosis, unspecified site [M15.9] 01/07/2007     Has seen orthopedics--cortisone shot to L knee helped.  L knee replacement 2008     . Diverticulitis of colon (without mention of hemorrhage) [K57.32] 01/07/2007     Hospitalized at [facility] May 2008     . Tobacco use disorder [Z72.0] 12/24/2005     Quit 2000.  50 pack year history  EtOH--quit 1986.     Marland Kitchen Unspecified hearing loss [H91.90] 12/24/2005     Bilat hearing aids.     . Benign neoplasm of colon [D12.6] 12/24/2005     Tubular adenoma. Dx'ed flex sig 2000--> colonoscopy 2000: N.  F/U 2003: mild diverticulosis, tubular adenoma.  F/U due 2008 with CT colonography.--done 7/08: ess. N.  Next due 2013       . Panlobular emphysema ([place]) [J43.1] 12/24/2005     05/2013: started by pulm on  home O2  Emphysema: an FEV1 which is approximately at 33% of predicted    04/2010--Pulm: Chronic obstructive pulmonary disease, severe to very severe. I explained to him that continuing on the Spiriva as well as the Serevent was likely good therapy.I further told him that if he should have recurrent exacerbations within the course of the year that I would consider adding an inhaled corticosteroid at that point in time. He does not need oxygen at this point in time. He remains a nonsmoker. He is already engaged in a pulmonary rehabilitation.    Today I described to him how overall lung volume reduction surgery had no significant benefit on mortality. Subgroup analysis did indicate that those patients, who had upper lobe predominant disease with low exercise capacity were the most likely to benefit. Given the fact that he actually still has reasonably good functional status and is getting around and going to the gym and working out, it is not clear that he would be in the group that would most likely benefit, although, to safe for certain, we need to get a cardiopulmonary exercise test. At this point in time, he is feeling good with his chronic obstructive pulmonary disease therapy and is not interested in pursuing lung volume reduction surgery.    05/20/2010 CT angio: Addendum: Increased scarring in the right lung base since the prior   exam including the interval development of a new nodule. The   appearance suggests that the patient has had a recent infection of   the right lower lobe has resolved and since the prior scan. However   recommend 3 months followup of the right lower lobe lesion given that   the patient is high risk with extensive paraseptal emphysema.     08/03/11 CT Chest  1. New indeterminate nodules in the lower lobes bilaterally, the largest   nodule measures 10 x 12 mm in the paraspinal location in the right lower lobe.  Possible considerations include infectious/inflammatory and less likely    metastasis.   2. Bilateral upper lobe predominant moderate centrilobular and paraseptal   emphysema.   3. Unchanged dilated main pulmonary artery measuring 32 mm may indicate   pulmonary arterial hypertension.   4. Aortic aneurysm involving the ascending aorta at the level of the main   pulmonary artery measuring 47 mm.     10/2011:   Impression:  1. Previous opacities resolved. New focus of small airways inflammation in the  RUL. No worrisome nodule or opacity for malignancy. If followup of the new   airways inflammation is desired a 12 month study will be  adequate.  2. Stable fibrosis and peripheral calcification in the RLL, which raises the   possibility of chronic aspiration.                  . Thoracic aneurysm without mention of rupture [I71.2] 12/24/2005     Ascending aortic aneurysm.  Followed by Dr. Ralene Cork, Cardiac surgery.  Gets regular CT scans.  Goal systolic BP: 010-272  Also seeing [name] cardiology  Also has AAA     . AAA (abdominal aortic aneurysm) ([place]) [I71.4] 12/24/2005     Father also had AAA. Borderline, seen on CT 8/06.  Also has thoracic aneurysm     . Ventricular septal defect [Q21.0] 12/24/2005     Small muscular VSD--NEEDS SBE PROPHYLAXIS. (Clindamycin 600 mg prior to dental visits.)     . Hypertrophy of prostate without urinary obstruction and other lower urinary tract symptoms (LUTS) [N40.0] 12/24/2005   . Chronic Kidney Disease -- Stage III [N25.89] 07/22/2005     Creat ~ 1.8. Normal albuminuria (8/08)  Likely 2/2 HTN, poss component from hx obstructing nephrolithiasis.  8/08 Renal: referred to Uro for 24mm stone, nonobstructing. Also doing w/u for stone type, poss target prevention.     Marland Kitchen MALIG NEOPLASM DORSAL TONGUE(aka CANCER) [C02.0] 03/29/2002     [place], T1N1 squamous cell ca L tongue, partial glossectomy; L supraorbital hyoid neck dissection       . HYPERTENSION NOS(aka HTN) [I10] 03/29/2002   . ECZEMA  [L25.9] 12/24/2005     Lower extremities in particular.     . Pure  hypercholesterolemia [E78.0] 03/29/2002   . 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 ventricular polyps [J38.7] 10/16/2014   . Dysphonia [R49.0] 10/16/2014   . Pneumothorax on right [J93.9] 01/22/2014   . Right rib fracture [S22.31XA] 01/22/2014   . History of non-ST elevation myocardial infarction (NSTEMI) [I25.2]      [place]       . History of alcohol dependence ([place]) [F10.21]      TX FROM [facility]       . Carotid stenosis [I65.29]      [place]       . Depression [F32.9] 11/10/2012   . Lumbar stenosis with neurogenic claudication [M48.06] 02/05/2012     March 21, 2012, underwent L4-L5 bilateral laminectomies and partial medial facetectomies, as well as an L4-L5 right-sided synovial cyst resection. He says since surgery he feels better       . ERECTILE DYSFUNCTION [N52.9] 12/24/2005   . Calculus of kidney [N20.0] 12/24/2005     Lithotripsy 1988  Stone panel in 0ct 09: high oxalate. Given low oxalae diet by Dr. Princella [name], nephrology in 2009.         Outpatient Prescriptions Prior to Visit   Medication Sig Dispense Refill   . Acamprosate Calcium 333 MG Oral Tab EC Take 333 mg by mouth 3 times a day. 90 tablet 1   . Albuterol Sulfate HFA 108 (90 BASE) MCG/ACT Inhalation Aero Soln Inhale 2 puffs by mouth every 4 hours as needed for shortness of breath/wheezing. For wheezing and/or cough. 1 Inhaler 12   . Aspirin 81 MG Oral Tab 1 tablet by mouth once daily     . Budesonide-Formoterol Fumarate 160-4.5 MCG/ACT Inhalation Aerosol Inhale 2 puffs by mouth every 12 hours. 3 Inhaler 4   . Cyanocobalamin (B-12) 100 MCG Oral Tab Take 100 mcg by mouth.     . Hydrochlorothiazide 25 MG Oral Tab Take 0.5  tablets (12.5 mg) by mouth daily. 45 tablet 3   . Lidocaine 5 % External Patch Apply 1 patch onto the skin every 24 hours. Remove patch(s) after 12 hours. 30 patch 6   . Metoprolol Succinate ER 100 MG Oral TABLET SR 24 HR Take 1 tablet (100 mg) by mouth daily. (Dose per discharge summary on 01/20/13) 90 tablet 3   . Multiple  Vitamins-Minerals (MULTIVITAMIN OR) Once daily     . NIFEdipine ER (AFEDITAB CR) 30 MG Oral TABLET SR 24 HR Take 1 tablet (30 mg) by mouth daily. 90 tablet 4   . Pantoprazole Sodium 40 MG Oral Tab EC Take 1 tablet (40 mg) by mouth every morning. 90 tablet 3   . Simvastatin 40 MG Oral Tab Take 1 tablet (40 mg) by mouth every evening. For cholesterol 90 tablet 3   . Tamsulosin HCl 0.4 MG Oral Cap Take 1 capsule (0.4 mg) by mouth at bedtime. 90 capsule 3   . Tiotropium Bromide Monohydrate (SPIRIVA HANDIHALER) 18 MCG Inhalation Cap Inhale the contents of 1 capsule via Handihaler once daily. After breathing out, inhale a second time. 90 capsule 2   . Triamcinolone Acetonide 0.1 % External Ointment Apply 1 application topically 2 times a day. Apply to affected areas bid 80 g 1     No facility-administered medications prior to visit.     O:  Pt alert and cooperative and in NAD.  BP 144/69 mmHg  Pulse 65  Temp(Src) 97.2 F (36.2 C) (Temporal)  Resp 16  Wt 204 lb 2 oz (92.59 kg)  SpO2 96%  Auscultation of the chest reveals good A/E to bases of both lungs though slightly diminished bilaterally  No pedal edema.    A/P:  (J43.1) Panlobular emphysema ([place])  (primary encounter diagnosis)  Plan: REFERRAL TO PULMONOLOGIST        Pt has a complex history and might have SOB due to multiple factors. He requires the use of more O2 subjectively now than in the past though lung function tests done by Pulm recently indicate that he might not require O2. On testing in the office, he clearly desaturated with exertion.   Will have him seen by pulmonology again to ensure that we are treating him with best meds for his pulmonary condition     (F32.1) Moderate single current episode of major depressive disorder ([place])  Plan: [place]        Pt does not feel particularly depressed though his wife thinks otherwise. He does endorse some symptoms of anhedonia and i think that it wouel be good for him to be  evlauted by Dr. Damita Dunnings for best medication regime.  He is currenlty on citalopram. Dose should not exceed 20 mg--he will check with his home dose.    Levora Angel, MD

## 2014-10-10 NOTE — Telephone Encounter (Signed)
Done

## 2014-10-10 NOTE — Patient Instructions (Addendum)
Call me with the dose of your citalopram pills. You should not take more thatn 20 mg daily.    Get the zoster vaccine at your local pharmacy

## 2014-10-10 NOTE — Progress Notes (Signed)
Reason for Visit: follow up    Refills? NO  Referral? NO  Letter or Form? NO  Lab Results? NO    HEALTH MAINTENANCE:  Has the patient had this done since their last visit?  Cervical screening/PAP: N/A  Mammo: N/A  Colon Screen: Had CT colonoscopy done [place] 2014 or 2015    Have you seen a specialist since your last visit: Yes - Name, location, and date Dr. Claybon Jabs, Cardiologist    Vaccines Due? Yes    HM Due:   Health Maintenance   Topic Date Due   . Colonoscopy  05-29-36   . Zoster Vaccine  09/26/1996   . Cholesterol Test  06/07/2017   . Tetanus Vaccine  05/20/2020   . Pneumococcal Vaccine  Completed   . Influenza Vaccine  Completed       }

## 2014-10-10 NOTE — Telephone Encounter (Signed)
CONFIRMED PHONE NUMBER: [phone]  CALLERS FIRST AND LAST NAME: Virgina Norfolk  FACILITY NAME: na TITLE: na  CALLERS RELATIONSHIP:Self  RETURN CALL: Detailed message on voicemail only     ([place]) Texting is an option for this clinic. If you would like [place] to use this option, which mobile phone number should we text to if we are unable to reach you?    SUBJECT: General Message   REASON FOR REQUEST: Scheduling from referral/provider schedule unavailable    MESSAGE: Patient is calling to schedule a return visit from a referral. Attempted with schedule with his established provider, [facility], but there are no openings in the current template. Please contact the patient to assist in scheduling. Thank you.

## 2014-10-10 NOTE — Telephone Encounter (Signed)
Spoke with patient and relayed message below, no further questions

## 2014-10-10 NOTE — Telephone Encounter (Signed)
Routing to Dr. Tanner for follow up.

## 2014-10-10 NOTE — Telephone Encounter (Signed)
CONFIRMED PHONE NUMBER: [phone]  CALLERS FIRST AND LAST NAME: Zoe Lan  FACILITY NAME: na TITLE: na  CALLERS RELATIONSHIP:Self  RETURN CALL: Detailed message on voicemail only    SUBJECT: Medication Management   REASON FOR REQUEST: Medication Management    MEDICATION(S): Citalopram  MEDICATION(S)  NEEDED BY: na  ADDITIONAL INFORMATION: Patient was asked by Dr. Satira Sark at his appointment today 6/15 regarding this medication. He wants to let her know that he is taking Citalopram 20 mg, 1.5 times a day. Please assist, thank you!

## 2014-10-10 NOTE — Telephone Encounter (Signed)
Thanks!  Can you call him back to ensure that he only takes max of 20 mg daily of the citalopram?

## 2014-10-10 NOTE — Telephone Encounter (Signed)
Can you change patient appt time from 10:00 to 9:00. Called pt and changed appt. Thanks

## 2014-10-11 NOTE — Progress Notes (Signed)
KARLIN, HEILMAN          [id number]          10/10/2014      SUBJECTIVE     I have not seen Mr. Mangold for a little over a year.  He apparently spend five months in [place].  He also started drinking again, but he tells me he has had no alcoholic drink for the last two weeks and he does go to [employer] every evening.  His main problem is shortness of breath walking short distances or going up stairs.  He has limited his exercise.  He is on continuous oxygen.  He has no orthopnea or paroxysmal nocturnal dyspnea, but he does have occasional ankle edema.  He no longer uses his CPAP.  He has had no chest pain.  Because of low blood pressure (95/50) his benazepril was stopped a week ago.  He did talk some time about his concern that he has no energy and no ambition to do anything, but he says he is not really depressed.  He admits that one of the problems is that he does not have a regular job at this time.    PHYSICAL EXAMINATION     VITAL SIGNS:  Blood pressure 118/70 sitting, 116/76 standing, pulse 56 per minute, weight 205 pounds (which is up 9 pounds since May 2015).  No increased systemic venous pressure.  He has grade 1 systolic carotid bruits bilaterally.  LUNGS:  Entirely clear.  I do not hear any rales.  HEART:  Regular rhythm.  There is a grade 1-2 aortic systolic ejection murmur.  No gallops, heaves, or percussive cardiomegaly.  ABDOMEN:  Obese.  Neither liver nor spleen are felt.  EXTREMITIES:  No peripheral edema.  Good pedal pulses.    ASSESSMENT AND PLAN     His creatinine on 08/24/2014, was 1.9 mg percent.  We will recheck a basic metabolic profile today.  His BNP was 940 picograms percent on 01/05/2014. We will recheck it today.  If it is still elevated, I may want to switch him from hydrochlorothiazide to furosemide starting at 20 mg a day.  He is to continue on the other medications as outlined.    I spent 40 minutes with the patient, the majority of time in face-to-face contact discussing the various  issues.

## 2014-10-12 ENCOUNTER — Telehealth (INDEPENDENT_AMBULATORY_CARE_PROVIDER_SITE_OTHER): Payer: No Typology Code available for payment source | Admitting: Social Worker

## 2014-10-12 MED ORDER — FUROSEMIDE 20 MG OR TABS
20.0000 mg | ORAL_TABLET | Freq: Every day | ORAL | Status: DC
Start: 2014-10-12 — End: 2015-09-05

## 2014-10-12 NOTE — Telephone Encounter (Signed)
SOCIAL WORK ASSESSMENT    10/12/2014    Primary provider: Levora Angel, MD      Patient description:                                                                Pt is a 80 year old, married, English-speaking, male, seen by Levora Angel, MD.    Pt  has a past medical history of Aortic aneurysm of unspecified site without mention of rupture ([place]); MALIG NEOPLASM DORSAL TONGUE(aka CANCER) (03/29/2002); Tobacco use disorder (12/24/2005); Unspecified hearing loss (12/24/2005); ECZEMA  (12/24/2005); ERECTILE DYSFUNCTION (12/24/2005); Benign neoplasm of colon (12/24/2005); Other specified disorders resulting from impaired renal function (07/22/2005); Calculus of kidney (12/24/2005); COPD (12/24/2005); Thoracic aneurysm without mention of rupture ([place]) (12/24/2005); Abdominal aneurysm without mention of rupture ([place]) (12/24/2005); Ventricular septal defect (12/24/2005); Hypertrophy of prostate without urinary obstruction and other lower urinary tract symptoms (LUTS) (12/24/2005); Generalized osteoarthrosis, unspecified site (01/07/2007); Diverticulitis of colon (without mention of hemorrhage) (01/07/2007); Unspecified disorder of muscle, ligament, and fascia; and Sleep apnea..    Pt PRIMARY Payor: [employer]MER[employer] BLUE CROSS / Plan: HERITAGE PLUS / Product Type: PPO.  SECONDARY: MEDICARE A & B    Presenting problem, patient / family goals:     Pt referred to SW by Levora Angel, MD for potential consult with Dr. Damita Dunnings for psych medication review.    Pt reports that he is "semi-retired" since 03/2010. 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 Pt states that other than that work, he spends his time attending [place] meetings every evening, watching some TV during the day, and also taking care of his 2 dogs. Pt states that he has not felt motivated to go out to  exercise and/or engage in other pleasurable activities for the past few years. Pt states this is mainly due to physical limitations, as he suffered a heart attack s/p stents in 2012-2013. Pt states prior to the heart attack, he was very active and was doing a lot of woodworking, which he really enjoyed. However, since the heart attack, he was not as physically able to engage in this activity and eventually gave up. Pt also notes he has pretty severe emphysema that has greatly limited his mobility; pt describes that he gets very winded going up 2 flights of stairs (7 steps each) in his tri-level home. Pt also notes he experienced a fall in the Fall of 2015. Pt reports that he has always been a physical and active person, so having to adjust to these limitations has been very "aggravating, disappointing, and depressing".    Pt reports he has restarted citalopram 20MG  QD about 2 months ago, but has not really noticed any improvement in his depressive sxs or increase in motivation to engage in activities. Pt expresses interest in consulting with a psychiatrist to review medications that may better address his depressive sxs.    Social History  Pt lives in [place] home with wife and 2 dogs. Pt uses a 4WW to ambulate occasionally, but does not use it consistently. Pt also reports that he is still able to drive and is independent with ADLs.  Mental Health History  Pt reports that he has never been through counseling to address this anhedonia and depressive sxs. Pt does report he went to counseling about 2 years ago to address ETOH use.    Other than citalopram, pt denies having tried other psych medications in the past.        Impression:     Pt appears to be experiencing anhedonia and depressive sxs, primarily due to physical limitations that he has sustained with declining health. Despite being somewhat active, it appears pt is still not as motivated as he used to be to engage in previously enjoyable activities. Pt  could benefit from counseling -- even in BHIP -- to help with behavioral activation.    Intervention:     Called pt this date to introduce self and SW services. Conducted targeted psychosocial assessment.     Discussed counseling to address anhedonia, which pt declined at this time. Pt verbalized preference to first consult with psychiatrist to explore medications. SW assisted with scheduling consult with Damita Dunnings, MD for Tuesday 07/05 @ [place] for follow-up:      Will f/u with pt as necessary after he meets with Damita Dunnings, MD on Tuesday 07/05.    Routing to Satira Sark, MD and Damita Dunnings, MD as [place] only.    Pt aware, agreeable, and participating in plan.    Kathalene Frames, MSW, LICSW  5/[id number]/5681  2:75 PM

## 2014-10-15 ENCOUNTER — Other Ambulatory Visit (INDEPENDENT_AMBULATORY_CARE_PROVIDER_SITE_OTHER): Payer: Self-pay | Admitting: Family Medicine

## 2014-10-15 DIAGNOSIS — F1021 Alcohol dependence, in remission: Secondary | ICD-10-CM

## 2014-10-16 ENCOUNTER — Ambulatory Visit
Payer: No Typology Code available for payment source | Attending: Orthopaedic Surgery | Admitting: Speech-Language Pathologist

## 2014-10-16 ENCOUNTER — Ambulatory Visit: Payer: No Typology Code available for payment source | Attending: Otolaryngology | Admitting: Otolaryngology

## 2014-10-16 VITALS — BP 105/57 | HR 62 | Temp 97.5°F | Wt 205.8 lb

## 2014-10-16 DIAGNOSIS — R490 Dysphonia: Secondary | ICD-10-CM | POA: Insufficient documentation

## 2014-10-16 DIAGNOSIS — J387 Other diseases of larynx: Secondary | ICD-10-CM | POA: Insufficient documentation

## 2014-10-16 DIAGNOSIS — J383 Other diseases of vocal cords: Secondary | ICD-10-CM | POA: Insufficient documentation

## 2014-10-16 MED ORDER — ACAMPROSATE CALCIUM 333 MG OR TBEC
DELAYED_RELEASE_TABLET | ORAL | Status: DC
Start: 2014-10-16 — End: 2015-09-18

## 2014-10-16 NOTE — Telephone Encounter (Signed)
CrCl Salazar-Corcoran Equation 42.9    Renal function impairment:Creatinine clearance 30 to 50 mL/minute:  Initial dosage:  333 mg 3 times daily

## 2014-10-16 NOTE — Progress Notes (Signed)
SPEECH LANGUAGE PATHOLOGY/LARYNGOLOGY EVALUATION   AND INITIAL PLAN OF CARE     The following patient identifiers were confirmed today name and date of birth.    Time in: 9:50  Duration: 45 min   Medicare Initial Certification From: 5/40/9811 Through: 01/13/2015   Onset Date: 10/15/2012  Start of Care Date: 10/16/2014  Visits from Start of Care:  1  Referring Provider:  []  Dr. Freddi Starr,  []  Dr. Cristina Gong  [x]   Dr. 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 is a 80 year old male with moderate rough/breathy/strained hypophonia in the setting of B TVF atrophy, incomplete glottic closure, compensatory hyperfunction and anterior ventricular polyps. MD discussed benefit and risk of biopsy as well as TVF injection augmentation; however, team and patient decided against medical procedures due to overall health concerns. Patient would likely benefit from voice therapy to increase efficiency of vocal production by coordinating respiration and phonation and decreasing vocal strain. Patient understands that voice may not be stronger/louder; however, goal is to become more consistent and less effortful. He may also benefit from amplification.       PERTINENT MEDICAL/SURGICAL HISTORY:   Past Medical History   Diagnosis Date   . Aortic aneurysm of unspecified site without mention of rupture (HCC)      Aortic Aneurysm   . MALIG NEOPLASM DORSAL TONGUE(aka CANCER) 03/29/2002     Ssurgery 1994, T1N1 squamous cell ca L tongue, partial glossectomy; L supraorbital hyoid neck dissection    . Tobacco use disorder 12/24/2005     Quit 2000.   Marland Kitchen Unspecified hearing loss 12/24/2005     Bilat hearing aids.   . ECZEMA  12/24/2005     Lower extremities in particular.   Marland Kitchen ERECTILE DYSFUNCTION 12/24/2005   . Benign neoplasm of colon 12/24/2005     Tubular adenoma. Dx'ed flex sig 2000--> colonoscopy 2000: N.  F/U 2003: mild  diverticulosis, tubular adenoma.  F/U due 2008 with CT colonography.   . Other specified disorders resulting from impaired renal function 07/22/2005     Creat ~ 1.2   . Calculus of kidney 12/24/2005     Lithotripsy 1988   . 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 Thoracic aneurysm without mention of rupture ([place]) 12/24/2005     Ascending aortic aneurysm.  Followed by Dr. Ralene Cork, Cardiac surgery.  Gets regular CT scans.  Goal systolic BP: [phone]   . Abdominal aneurysm without mention of rupture ([place]) 12/24/2005     Borderline, seen on CT 8/06.   . Ventricular septal defect 12/24/2005     Small muscular VSD--NEEDS SBE PROPHYLAXIS. (Clindamycin 600 mg prior to dental visits.)   . Hypertrophy of prostate without urinary obstruction and other lower urinary tract symptoms (LUTS) 12/24/2005   . Generalized osteoarthrosis, unspecified site 01/07/2007     Has seen orthopedics--cortisone shot to L knee helped.   . Diverticulitis of colon (without mention of hemorrhage) 01/07/2007     Hospitalized at [facility] May 2008   . Unspecified disorder of muscle, ligament, and fascia    . Sleep apnea        Past Surgical History   Procedure Laterality Date   . Ventral hernia repair       x3   . Removal of tongue carcinoma with  lymphadenectomy     . Fusion l foot distal phalange     . Lithotrypsy of r renal calculi     . Retinaculum release b/l wrists     . Vasectomy w/tray       x2   . Vasectomy reversal     . Rpr 1st ingun hrna age 78 mo-5 yrs reducible     . Revj tot knee arthrp fem&entire tibial compone       left knee   . Ligation prq vas deferens uni/bi spx     . Brachy therapy     . Lipotripsy surgery     . Heart stent placement  09/2010, 02/2011     [place]   . Unlisted procedure spine  Mar 21 2012     L spine surgery        Prior Level of Function: Before the onset of the current condition, the patient was able to speak normally.    SUBJECTIVE:   Pt. was alert and ready to participate     Patient Statement: Pt. c/o hoarseness for the past 1-2 years. He  reported that his voice bothers him intermittently. He reported that he has some trouble speaking at [employer] and that it is difficult for others to understand him on the phone. He is a previous smoker with past heavy alcohol use.    Barriers to learning: none  Preferred Learning style:  demonstration, written and verbal  Cultural Practices that Influence Care: none    Pain Rating/Action Taken/Fall report:  See Patient Self Assessment of Pain and falls in medical record in OTO section from today.    Voice Usage: Intermittent    VHI-10 Score 12/40 EAT-10 Score 0/40   RSI Score 7/45     DI Score 16/40 MLCQ Score -/40 SVHI Score -/40       OBJECTIVE:    Observations:   Resonance: WNL  Focus: posterior tone focus       Respiration: Poor coordination of phonation with respiration    Articulation: WNL    Other: none   []  maximum phonation time -     Perceptual Rating Of Vocal Quality:  CAPE-V  The following parameters of voice quality will be rated upon completion of the following tasks:  1. Sustained vowels 2. Sentence production 3. Spontaneous speech    Clinician's Perceptual Assessment using Consensus Auditory Perceptual Evaluation of Voice (CAPE-V)    Overall Severity  28/100  [x]  Consistent []  Intermittent  Roughness   35/100  [x]  Consistent []  Intermittent  Breathiness   22/100  [x]  Consistent []  Intermittent  Strain    28/100  [x]  Consistent []  Intermittent  Pitch    20/100  [x]  Consistent []  Intermittent       Nature of abnormality:  Loudness   12/100  [x]  Consistent []  Intermittent       Nature of abnormality:      Clinical Swallow Evaluation:  Oral Mechanism Examination: Not evaluated    Clinical Observations: Not tested    3 oz water swallow: not tested    Modified Functional Oral Intake Scale:  7 - Total oral intake with no restrictions              Fiberoptic Endoscopic Evaluation of Swallowing:  Patient was then seen with:  []  Dr. Freddi Starr,  []  Dr. Cristina Gong  []   Dr. Bjorn Loser  laryngologist, who performed  flexible endoscopy which revealed the following:     not tested  Videostroboscopic Findings:    Patient was then seen with:  []  Dr. Freddi Starr,  []  Dr. Cristina Gong  []   Dr. Bjorn Loser  laryngologist, who performed flexible videostroboscopy which revealed the following:    MUCOSA: Posterior TVF Erythema, Bowing: Moderate and Lesion/s: Bilateral vocal folds with moderate area of ventricular polyp    VOCAL FOLD MOVEMENT:  Right: normal  Left: normal     GLOTTIC CLOSURE: Incomplete: moderate spindle    VIBRATION:   Periodicity: periodic     Phase: asymmetrical  Mucosal Wave:  Right: present  Left: present   Amplitude:  Right: normal   Left: normal     MUSCLE TENSION PATTERN: Lateral (Type II): bilateral; severe    Supraglottis: anterior ventricular polyps  Subglottis: Not visualized   Tremor: No  Breaks: No  Observations with Pitch Change: Vocal folds lengthen appropriately with increased pitch.  Observations with Changes in Loudness: slight improved closure but increased hyperfunction with increased volume    Observations with Treatment Probes: Decreased strain with unloading     TREATMENT AND EDUCATION:    Education:  Plan/recommendations were reviewed with patient/caregiver.  The following were: Taught to patient/caregiver; voice    Contact information was provided for further questions.     Response: patient/caregiver verbalized understanding and may need further instruction    Skilled Services Provided: Beh. & qualitative analysis of voice & resonance    ASSESSMENT:  See clinical impression above.     Rehabilitation potential is: Fair  Potential Barriers to achieve rehab goals: none  These goals were discussed and the patient agrees with them.      DISCHARGE GOALS:  1. Maintain best quality voice for functional communication and participation in community activities:    1a. Evaluate status of vocal folds  1b. Patient will participate in education of basic anatomy and physiology of vocal fold vibration  1d. Patient will  express comprehension of good vocal hygiene   1e. Patient will express comprehension of reflux management  1g. During 2 minutes of spontaneous speech without cues, patient will demonstrate: easy onset/flow phonation, forward focus and coordinated phonation and respiration    Goals to be met by 01/13/2015    Patient/Family Input to Goals: Pt. wishes to: improve voice quality and know status of vocal folds    PLAN OF CARE:   Frequency/Duration of Treatment: Pt. will be seen 1x/week for 2-3 weeks for Voice tx .    This patient will recheck with the referring provider per MD recs.  in 6 months.    The plan outlined above was formulated, reviewed, and agreed upon with the patient and/or family/caregiver.  Patient/caregiver verbalized understanding and restated information in plan.  Lyn Records, SLP    FUNCTIONAL REPORTING (Medicare only):  Reporting on Goal # 1; Assessed at level 3 today using NOMS.   Current evaluation: Voice: [id number] C L  Goal Status: Voice: Q3009 C K                       Referring Physician/Practitioner Certification Statement:    I Certify the need for these services furnished under this plan of treatment and while under my care.

## 2014-10-16 NOTE — Progress Notes (Signed)
This note was dictated by Meyer, Tanya K, MD.

## 2014-10-18 NOTE — Progress Notes (Signed)
BRANON, SABINE          [id number]          10/16/2014        DATE OF SERVICE     10/16/2014.    Mr. Meschke is here in followup for bilateral ventricular polyps.  We originally were referred Mr. Nelms from Dr. Augustin Coupe for possible biopsy, as there is a slight erythematous quality to the ventricular polyps, but we did send him for anesthesia clearance and they recommended that unless there was no other option, that we should avoid any type of intervention or general anesthetic until it was absolutely necessary and for this reason, we have decided to avoid biopsy and just follow him by interval exam.  I do not think the ventricular polyps are amenable to an office biopsy, and I think that the cardiovascular tress from the stimulation from an office biopsy would not be safe for Mr. Craton.  Overall, he is doing well.  He does get frustrated because his voice is hard to hear, there is irregularity and it is difficult for him to talk at his [employer] meetings and this has been the case for several years.  Mr. Madlock is accompanied by his wife today.    SOCIAL HISTORY     He is married.  He is no longer smoking, but does have a history of smoking.  He does use alcohol on occasion.    REVIEW OF SYSTEMS     Positive for use of oxygen with intermittent difficulty breathing.  Positive for intermittent lower extremity swelling.    PHYSICAL EXAMINATION     GENERAL:  He is in a good mood today, alert and oriented, very deep raspy hoarse voice.  Oral cavity without any lesions.  Tongue mobile and midline.  NECK:  Without lymphadenopathy.  Laryngotracheal structures midline.  CHEST:  Chest rise is symmetric.  EXTREMITIES:  No edema or tremor to the upper extremities.    To better evaluate the larynx, a fiberoptic laryngoscopy with stroboscopy was performed.    PROCEDURE     The flexible scope was placed in the nasopharynx.  His pharynx was clear.  Palate raised symmetrically.  Base of tongue, vallecula, epiglottis, and piriform sinuses without  any lesion.  He had normal vocal fold motion.  Anterior commissure is sharp.  He does have bilateral slightly erythematous ventricular polyps. 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    ASSESSMENT AND PLAN     Mr. Grassia does have hoarseness. I feel this is from the glottic insufficiency.  He has false vocal fold compression and I think some of the pushing may have lead to the ventricular polyps, but we can certainly watch the polyps over time.  At this point, they are stable over the last 2 months.  He will follow up with me in 6 months and pursue speech language therapy. In the interim, we did discuss glottic augmentation, but I think that this would be a risky procedure for him and the repeated nature of the procedure is also problematic and so at this point, I think better use of the voice he currently has is probably his best option.

## 2014-10-24 ENCOUNTER — Telehealth (HOSPITAL_BASED_OUTPATIENT_CLINIC_OR_DEPARTMENT_OTHER): Payer: Self-pay | Admitting: Speech-Language Pathologist

## 2014-10-24 ENCOUNTER — Encounter (HOSPITAL_BASED_OUTPATIENT_CLINIC_OR_DEPARTMENT_OTHER): Payer: No Typology Code available for payment source | Admitting: Speech-Language Pathologist

## 2014-10-24 NOTE — Telephone Encounter (Signed)
CONFIRMED PHONE NUMBER: [phone]  CALLERS FIRST AND LAST NAME: Virgina Norfolk  FACILITY NAME: n/a TITLE: n/a  CALLERS RELATIONSHIP:Self  RETURN CALL: No callback needed     ([place]) Texting is an option for this clinic. If you would like [place] to use this option, which mobile phone number should we text to if we are unable to reach you?    SUBJECT: Cancellation/Reschedule   REASON FOR CANCELLATION: Other overslept  RESCHEDULED: YES, Date 11/16/14, Time 2 PM

## 2014-10-30 ENCOUNTER — Ambulatory Visit (INDEPENDENT_AMBULATORY_CARE_PROVIDER_SITE_OTHER): Payer: No Typology Code available for payment source | Admitting: Psychiatry

## 2014-10-30 DIAGNOSIS — F1021 Alcohol dependence, in remission: Secondary | ICD-10-CM

## 2014-10-30 DIAGNOSIS — F325 Major depressive disorder, single episode, in full remission: Secondary | ICD-10-CM

## 2014-10-30 DIAGNOSIS — F1099 Alcohol use, unspecified with unspecified alcohol-induced disorder: Secondary | ICD-10-CM
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
## 2014-11-15 ENCOUNTER — Ambulatory Visit (INDEPENDENT_AMBULATORY_CARE_PROVIDER_SITE_OTHER): Payer: No Typology Code available for payment source | Admitting: Family Medicine

## 2014-11-15 ENCOUNTER — Encounter (INDEPENDENT_AMBULATORY_CARE_PROVIDER_SITE_OTHER): Payer: Self-pay | Admitting: Family Medicine

## 2014-11-15 VITALS — BP 114/69 | HR 66 | Temp 98.2°F | Wt 203.0 lb

## 2014-11-15 DIAGNOSIS — N2589 Other disorders resulting from impaired renal tubular function: Secondary | ICD-10-CM

## 2014-11-15 DIAGNOSIS — I712 Thoracic aortic aneurysm, without rupture, unspecified: Secondary | ICD-10-CM

## 2014-11-15 DIAGNOSIS — J431 Panlobular emphysema: Secondary | ICD-10-CM

## 2014-11-15 DIAGNOSIS — R42 Dizziness and giddiness: Secondary | ICD-10-CM

## 2014-11-15 DIAGNOSIS — I10 Essential (primary) hypertension: Secondary | ICD-10-CM

## 2014-11-15 LAB — CBC, DIFF
% Basophils: 1 %
% Eosinophils: 2 %
% Immature Granulocytes: 1 %
% Lymphocytes: 27 %
% Monocytes: 14 %
% Neutrophils: 55 %
% Nucleated RBC: 0 %
Absolute Eosinophil Count: 0.1 10*3/uL (ref 0.00–0.50)
Absolute Lymphocyte Count: 1.35 10*3/uL (ref 1.00–4.80)
Basophils: 0.05 10*3/uL (ref 0.00–0.20)
Hematocrit: 38 % (ref 38–50)
Hemoglobin: 12.6 g/dL — ABNORMAL LOW (ref 13.0–18.0)
Immature Granulocytes: 0.04 10*3/uL (ref 0.00–0.05)
MCH: 30.5 pg (ref 27.3–33.6)
MCHC: 33.2 g/dL (ref 32.2–36.5)
MCV: 92 fL (ref 81–98)
Monocytes: 0.69 10*3/uL (ref 0.00–0.80)
Neutrophils: 2.77 10*3/uL (ref 1.80–7.00)
Nucleated RBC: 0 10*3/uL
Platelet Count: 227 10*3/uL (ref 150–400)
RBC: 4.13 10*6/uL — ABNORMAL LOW (ref 4.40–5.60)
RDW-CV: 14.7 % — ABNORMAL HIGH (ref 11.6–14.4)
WBC: 5 10*3/uL (ref 4.3–10.0)

## 2014-11-15 NOTE — Progress Notes (Signed)
S:  Pt is her primarily to Follow up BP  He brings in a records of his BPs lately--they are all running About 115/60's  Off benazepril altogether  Did have A low of 90/47 on 7/16--when it is that low gets lightheaded.:  Other BP meds:  Metoprolol 100 mg qd  Furosemide 20 mg daily  Nifedipine 30 qd    Also c/o easy bruising--mostly on arms  i notes that last CBC in sept in our record showed that he had an anemia--that was ordered by surgery.    Also reviewed the CT done at [facility] in [facility] resolving lung mass. Needs repeat CT chest    REviewed radiological studies that were done when he was in [place]: was noted to have Ascending thoracic aneurysm Apr 23, 2014: 4.5 cm--pt does have F/U appt in sept with Dr Jari Favre, cardiology    Pt did see Dr. Carmelina Dane suggested that his depression is essentially resolved. Pt is interested in going off the citalopram    Re Etoh: states that he did have some slips, but minor. Still going to AA meetings--enjoys the camraderie in those meetings    Last lipid jan 2016 in [place]--reviewed

## 2014-11-15 NOTE — Patient Instructions (Addendum)
Take citalopram 10 mg x one month (1/2 tab) and then 10 mg every other day for about one month then stop    We will do the repeat CT chest in Septemeber    Our referral staff will help with the pulmonary consult

## 2014-11-15 NOTE — Progress Notes (Signed)
Reason for Visit: blood pressure f/u    Refills? NO  Referral? NO  Letter or Form? NO  Lab Results? NO    HEALTH MAINTENANCE:  Has the patient had this done since their last visit?  Cervical screening/PAP: N/A  Mammo: N/A  Colon Screen: no    Have you seen a specialist since your last visit: No    Vaccines Due? Yes, Zoster Pt declined    HM Due:   Health Maintenance   Topic Date Due   . Colonoscopy  09-Jul-1934   . Zoster Vaccine  09/26/1996   . Influenza Vaccine (1) 12/27/2014   . Cholesterol Test  06/07/2017   . Tetanus Vaccine  05/20/2020   . Pneumococcal Vaccine  Completed       PCP Verified?  Yes, Dr Satira Sark

## 2014-11-15 NOTE — Progress Notes (Signed)
S:  Pt is her primarily to Follow up BP  He brings in a records of his BPs lately--they are all running About 115/60's  Off benazepril altogether  Did have A low of 90/47 on 7/16--when it is that low gets lightheaded.:  Other BP meds:  Metoprolol 100 mg qd  Furosemide 20 mg daily  Nifedipine 30 qd    Also c/o easy bruising--mostly on arms  i notes that last CBC in sept in our record showed that he had an anemia--that was ordered by surgery.    Also reviewed the CT done at [facility] in [facility] resolving lung mass. Needs repeat CT chest    REviewed radiological studies that were done when he was in [place]: was noted to have Ascending thoracic aneurysm Apr 23, 2014: 4.5 cm--pt does have F/U appt in sept with Dr Jari Favre, cardiology    Pt did see Dr. Carmelina Dane suggested that his depression is essentially resolved. Pt is interested in going off the citalopram    Re Etoh: states that he did have some slips, but minor. Still going to AA meetings--enjoys the camraderie in those meetings    Last lipid jan 2016 in [place]--reviewed: T 191, H 69    Patient Active Problem List   Diagnosis   . HYPERTENSION NOS(aka HTN)   . Pure hypercholesterolemia   . MALIG NEOPLASM DORSAL TONGUE(aka CANCER)   . Chronic Kidney Disease -- Stage III   . Tobacco use disorder   . Unspecified hearing loss   . ECZEMA    . ERECTILE DYSFUNCTION   . Benign neoplasm of colon   . Calculus of kidney   . Panlobular emphysema ([place])   . Thoracic aneurysm without mention of rupture   . AAA (abdominal aortic aneurysm) ([place])   . Ventricular septal defect   . Hypertrophy of prostate without urinary obstruction and other lower urinary tract symptoms (LUTS)   . Generalized osteoarthrosis, unspecified site   . Diverticulitis of colon (without mention of hemorrhage)   . Routine general medical examination at a health care facility   . Lumbago   . Malignant neoplasm of prostate ([place])   . Coronary artery disease involving native coronary artery of native heart with angina  pectoris ([place])   . Lumbar stenosis with neurogenic claudication   . Major depressive disorder with single episode, in full remission ([place])   . History of non-ST elevation myocardial infarction (NSTEMI)   . Alcohol use disorder, moderate, in sustained remission ([place])   . Carotid stenosis   . Falls   . Pneumothorax on right   . Right rib fracture   . Vocal fold atrophy   . ventricular polyps   . Dysphonia     Outpatient Prescriptions Prior to Visit   Medication Sig Dispense Refill   . Acamprosate Calcium 333 MG Oral Tab EC TAKE 1 TABLET THREE TIMES A DAY 270 tablet 0   . Albuterol Sulfate HFA 108 (90 BASE) MCG/ACT Inhalation Aero Soln Inhale 2 puffs by mouth every 4 hours as needed for shortness of breath/wheezing. For wheezing and/or cough. 1 Inhaler 12   . Aspirin 81 MG Oral Tab 1 tablet by mouth once daily     . Budesonide-Formoterol Fumarate 160-4.5 MCG/ACT Inhalation Aerosol Inhale 2 puffs by mouth every 12 hours. 3 Inhaler 4   . Cyanocobalamin (B-12) 100 MCG Oral Tab Take 100 mcg by mouth.     . Furosemide 20 MG Oral Tab Take 1 tablet (20 mg) by mouth daily. [place]  tablet 3   . Metoprolol Succinate ER 100 MG Oral TABLET SR 24 HR Take 1 tablet (100 mg) by mouth daily. (Dose per discharge summary on 01/20/13) 90 tablet 3   . Multiple Vitamins-Minerals (MULTIVITAMIN OR) Once daily     . NIFEdipine ER (AFEDITAB CR) 30 MG Oral TABLET SR 24 HR Take 1 tablet (30 mg) by mouth daily. 90 tablet 4   . Pantoprazole Sodium 40 MG Oral Tab EC Take 1 tablet (40 mg) by mouth every morning. 90 tablet 3   . Simvastatin 40 MG Oral Tab Take 1 tablet (40 mg) by mouth every evening. For cholesterol 90 tablet 3   . Tamsulosin HCl 0.4 MG Oral Cap Take 1 capsule (0.4 mg) by mouth at bedtime. 90 capsule 3   . Tiotropium Bromide Monohydrate (SPIRIVA HANDIHALER) 18 MCG Inhalation Cap Inhale the contents of 1 capsule via Handihaler once daily. After breathing out, inhale a second time. 90 capsule 2   . Triamcinolone Acetonide 0.1 % External  Ointment Apply 1 application topically 2 times a day. Apply to affected areas bid 80 g 1     No facility-administered medications prior to visit.     O:  Pt alert and cooperative and in NAD.  BP 114/69 mmHg  Pulse 66  Temp(Src) 98.2 F (36.8 C) (Temporal)  Wt 203 lb (92.08 kg)  SpO2 96%  Chest: decreased AE bilat no crackles no wheezes   no peripheral edema    A/P:    1) HTN: good control on current meds. Stable.  Continue current meds.  With episodes of lightheadedness, will get CBC to ensure no deterioration in anemia    2) easy bruising. Likely due to aspirin and passage of time. However last CBC showed anemia. Will get repeat CBC to ensure no abnormality in platelets or worsening anemia    3) hx lung mass noted on CT. Last CT in may 2016 at [facility] showed improvement--needs repeat in aobut august. We decided to do this after he sees [name] in Sept. May do CT for thoracic aneurysm and CT for follow up lung mass at the same time. However with his Creat--he may not be able to get contrast.    4) re depression: seems to be better. He wil go off citalopram gradually.    5) Etoh: encouraged him to abstain, to continue with AA. He continues on acamprosate.    Will also check on his referral to pulmonology.    rtc 4-6 weeks.    Levora Angel, MD

## 2014-11-16 ENCOUNTER — Telehealth (HOSPITAL_BASED_OUTPATIENT_CLINIC_OR_DEPARTMENT_OTHER): Payer: Self-pay | Admitting: Speech-Language Pathologist

## 2014-11-16 ENCOUNTER — Encounter (HOSPITAL_BASED_OUTPATIENT_CLINIC_OR_DEPARTMENT_OTHER): Payer: No Typology Code available for payment source | Admitting: Speech-Language Pathologist

## 2014-11-16 NOTE — Telephone Encounter (Signed)
CONFIRMED PHONE NUMBER: [phone]  CALLERS FIRST AND LAST NAME: Zoe Lan  FACILITY NAME: na TITLE: na  CALLERS RELATIONSHIP:Self  RETURN CALL: General message OK     ([place]) Texting is an option for this clinic. If you would like [place] to use this option, which mobile phone number should we text to if we are unable to reach you?    SUBJECT: Cancellation/Reschedule   REASON FOR CANCELLATION: Family/work conflict  RESCHEDULED: NO will call later to rebook

## 2014-11-23 ENCOUNTER — Telehealth (HOSPITAL_BASED_OUTPATIENT_CLINIC_OR_DEPARTMENT_OTHER): Payer: Self-pay | Admitting: Pulmonary Disease

## 2014-11-23 ENCOUNTER — Encounter (HOSPITAL_BASED_OUTPATIENT_CLINIC_OR_DEPARTMENT_OTHER): Payer: No Typology Code available for payment source | Admitting: Pulmonary Disease

## 2014-11-23 NOTE — Telephone Encounter (Signed)
(  TEXTING IS AN OPTION FOR [facility] CLINICS ONLY)  Is this a [facility] clinic? No      RETURN CALL: Detailed message on voicemail only      SUBJECT:  Cancellation/Reschedule Notification     ORIGINAL APPOINTMENT DATE: 11/23/14, TIME: 2:30  REASON: Not feeling well enough to make the appointment  RESCHEDULED: NO per SOP

## 2014-12-19 ENCOUNTER — Encounter (HOSPITAL_BASED_OUTPATIENT_CLINIC_OR_DEPARTMENT_OTHER): Payer: Self-pay | Admitting: Pulmonary Disease

## 2014-12-19 ENCOUNTER — Ambulatory Visit: Payer: No Typology Code available for payment source | Attending: Pulmonary Disease | Admitting: Pulmonary Disease

## 2014-12-19 VITALS — BP 108/54 | HR 68 | Temp 97.9°F | Ht 70.08 in | Wt 211.9 lb

## 2014-12-19 DIAGNOSIS — J431 Panlobular emphysema: Secondary | ICD-10-CM | POA: Insufficient documentation

## 2014-12-19 NOTE — Progress Notes (Signed)
ID: Tracy Conway is here for follow-up of his GOLD IV COPD.     Billing Data: I spent a total of 50  minutes face to face over half of which was in direct counseling.     CC:   Chief Complaint   Patient presents with   . COPD     f/u         PL:   Patient Active Problem List    Diagnosis Date Noted   . 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 ventricular polyps [J38.7] 10/16/2014   . Dysphonia [R49.0] 10/16/2014   . Pneumothorax on right [J93.9] 01/22/2014   . Right rib fracture [S22.31XA] 01/22/2014   . Falls [id number]] 10/26/2013     Inpatient evaluation September 2014, felt to have micturition syncope.  7/15 -- orthostatic hypotension sxs, BPs in afternoon in mid to high 90s to low 621H systolic, 08M to 57Q diastolic. D/Ced Nifedipine.     Marland Kitchen History of non-ST elevation myocardial infarction (NSTEMI) [I25.2]      [place]       . Alcohol use disorder, moderate, in sustained remission ([place]) [F10.99]      [place]       . Carotid stenosis [I65.29]      [place]       . Major depressive disorder with single episode, in full remission ([place]) [F32.5] 11/10/2012   . Lumbar stenosis with neurogenic claudication [M48.06] 02/05/2012     March 21, 2012, underwent L4-L5 bilateral laminectomies and partial medial facetectomies, as well as an L4-L5 right-sided synovial cyst resection. He says since surgery he feels better       . Coronary artery disease involving native coronary artery of native heart with angina pectoris ([place]) [I25.119] 12/30/2010     Non-ST elevation myocardial infarction., July 18,2012  LAD drug-eluting stent nov, 2012. Clopidogrel  See [name], cardiology       . Malignant neoplasm of prostate ([place]) [C61] 08/14/2010     Diagnosed 07/2010. Sees [name] (urol) and [name] (rad onc). Saw [name], [name]--[facility]--palladium seed implants June 2012  Mr. Want has an intermediate risk prostate cancer on the basis of a Gleason 7 histology       . Lumbago [M54.5]  12/19/2009     March 21, 2012, underwent L4-L5 bilateral laminectomies and partial medial facetectomies, as well as an L4-L5 right-sided synovial cyst resection. He says since surgery he feels better       . Routine general medical examination at a health care facility [Z00.00] 05/15/2009     06/07/2012  Td/Tdap (q 10 yr):  2004, 2012  Pneumococcal (65 yoa and high risk x1):  2010, 2004  Influenza vaccine (q 1 yr):  2010 nov, 2011, 2012, 2013  Shingles: discussed--should get at his local pharmacy  Blood pressure:  Excellent 138/64  121/72  Cholesterol (q 5 yr begin age 64):on simvastatin oct 2012: excellent--will repeat  AAA screen (x1, male age 29, if ever smoked):  Checked at [place]-- abdo and thoracic CT--aug 2012: stable ascending aortic aneurysm  Colorectal cancer: ct colonoscopy done 2008, repeat due 2013--done April 2013: neg   PSA:  Sees urology for fu prostate ca. Has had palladium seed implants  Tobacco:  NS x 11 yrs, 50 pack yr hx  Exercise:  Reg--treadmill x 20-30 mins then weight machines--every other day! Has not been back to the gym because of the back pain  Alcohol /Drugs:  None in 26 yrs--relapse jan 2014--did inpatient treatment--now none in 37 days!  Sexual practice/contraception:  Not an issue  Advanced directives:  Does have living will. DPOA  done       . Generalized osteoarthrosis, unspecified site [M15.9] 01/07/2007     Has seen orthopedics--cortisone shot to L knee helped.  L knee replacement 2008     . Diverticulitis of colon (without mention of hemorrhage) [K57.32] 01/07/2007     Hospitalized at [facility] May 2008     . Tobacco use disorder [Z72.0] 12/24/2005     Quit 2000.  50 pack year history  EtOH--quit 1986.     Marland Kitchen Unspecified hearing loss [H91.90] 12/24/2005     Bilat hearing aids.     . ECZEMA  [L25.9] 12/24/2005     Lower extremities in particular.     Marland Kitchen ERECTILE DYSFUNCTION [N52.9] 12/24/2005   . Benign neoplasm of colon [D12.6] 12/24/2005     Tubular adenoma. Dx'ed flex sig 2000-->  colonoscopy 2000: N.  F/U 2003: mild diverticulosis, tubular adenoma.  F/U due 2008 with CT colonography.--done 7/08: ess. N.  Next due 2013       . Calculus of kidney [N20.0] 12/24/2005     Lithotripsy 1988  Stone panel in 0ct 09: high oxalate. Given low oxalae diet by Dr. Princella [name], nephrology in 2009.       Marland Kitchen Panlobular emphysema ([place]) [J43.1] 12/24/2005     05/2013: started by pulm on home O2  Emphysema: an FEV1 which is approximately at 33% of predicted    04/2010--Pulm: Chronic obstructive pulmonary disease, severe to very severe. I explained to him that continuing on the Spiriva as well as the Serevent was likely good therapy.I further told him that if he should have recurrent exacerbations within the course of the year that I would consider adding an inhaled corticosteroid at that point in time. He does not need oxygen at this point in time. He remains a nonsmoker. He is already engaged in a pulmonary rehabilitation.    Today I described to him how overall lung volume reduction surgery had no significant benefit on mortality. Subgroup analysis did indicate that those patients, who had upper lobe predominant disease with low exercise capacity were the most likely to benefit. Given the fact that he actually still has reasonably good functional status and is getting around and going to the gym and working out, it is not clear that he would be in the group that would most likely benefit, although, to safe for certain, we need to get a cardiopulmonary exercise test. At this point in time, he is feeling good with his chronic obstructive pulmonary disease therapy and is not interested in pursuing lung volume reduction surgery.    05/20/2010 CT angio: Addendum: Increased scarring in the right lung base since the prior   exam including the interval development of a new nodule. The   appearance suggests that the patient has had a recent infection of   the right lower lobe has resolved and since the prior scan. However    recommend 3 months followup of the right lower lobe lesion given that   the patient is high risk with extensive paraseptal emphysema.     08/03/11 CT Chest  1. New indeterminate nodules in the lower lobes bilaterally, the largest   nodule measures 10 x 12 mm in the paraspinal location in the right lower lobe.  Possible considerations include infectious/inflammatory and less likely   metastasis.  2. Bilateral upper lobe predominant moderate centrilobular and paraseptal   emphysema.   3. Unchanged dilated main pulmonary artery measuring 32 mm may indicate   pulmonary arterial hypertension.   4. Aortic aneurysm involving the ascending aorta at the level of the main   pulmonary artery measuring 47 mm.     10/2011:   Impression:  1. Previous opacities resolved. New focus of small airways inflammation in the  RUL. No worrisome nodule or opacity for malignancy. If followup of the new   airways inflammation is desired a 12 month study will be adequate.  2. Stable fibrosis and peripheral calcification in the RLL, which raises the   possibility of chronic aspiration.                  . Thoracic aneurysm without mention of rupture [I71.2] 12/24/2005     Ascending aortic aneurysm.  Followed by Dr. Ralene Cork, Cardiac surgery.  Gets regular CT scans.  Goal systolic BP: 644-034  Also seeing [name] cardiology  Also has AAA     . AAA (abdominal aortic aneurysm) ([place]) [I71.4] 12/24/2005     Father also had AAA. Borderline, seen on CT 8/06.  Also has thoracic aneurysm     . Ventricular septal defect [Q21.0] 12/24/2005     Small muscular VSD--NEEDS SBE PROPHYLAXIS. (Clindamycin 600 mg prior to dental visits.)     . Hypertrophy of prostate without urinary obstruction and other lower urinary tract symptoms (LUTS) [N40.0] 12/24/2005   . Chronic Kidney Disease -- Stage III [N25.89] 07/22/2005     Creat ~ 1.8. Normal albuminuria (8/08)  Likely 2/2 HTN, poss component from hx obstructing nephrolithiasis.  8/08 Renal: referred to Uro for 43mm  stone, nonobstructing. Also doing w/u for stone type, poss target prevention.     Marland Kitchen HYPERTENSION NOS(aka HTN) [I10] 03/29/2002   . Pure hypercholesterolemia [E78.0] 03/29/2002   . MALIG NEOPLASM DORSAL TONGUE(aka CANCER) [C02.0] 03/29/2002     [place], T1N1 squamous cell ca L tongue, partial glossectomy; L supraorbital hyoid neck dissection           MEDS:    Outpatient Prescriptions Prior to Visit   Medication Sig Dispense Refill   . Acamprosate Calcium 333 MG Oral Tab EC TAKE 1 TABLET THREE TIMES A DAY 270 tablet 0   . Albuterol Sulfate HFA 108 (90 BASE) MCG/ACT Inhalation Aero Soln Inhale 2 puffs by mouth every 4 hours as needed for shortness of breath/wheezing. For wheezing and/or cough. 1 Inhaler 12   . Aspirin 81 MG Oral Tab 1 tablet by mouth once daily     . Budesonide-Formoterol Fumarate 160-4.5 MCG/ACT Inhalation Aerosol Inhale 2 puffs by mouth every 12 hours. 3 Inhaler 4   . Citalopram Hydrobromide 10 MG Oral Tab 1 tab qd x 1 month and then 1 tab every other day x 1 month then dc (was on 20 mg tabs before)     . Cyanocobalamin (B-12) 100 MCG Oral Tab Take 100 mcg by mouth.     . Furosemide 20 MG Oral Tab Take 1 tablet (20 mg) by mouth daily. 90 tablet 3   . Metoprolol Succinate ER 100 MG Oral TABLET SR 24 HR Take 1 tablet (100 mg) by mouth daily. (Dose per discharge summary on 01/20/13) 90 tablet 3   . Multiple Vitamins-Minerals (MULTIVITAMIN OR) Once daily     . NIFEdipine ER (AFEDITAB CR) 30 MG Oral TABLET SR 24 HR Take 1 tablet (30 mg) by mouth daily.  90 tablet 4   . Pantoprazole Sodium 40 MG Oral Tab EC Take 1 tablet (40 mg) by mouth every morning. 90 tablet 3   . Simvastatin 40 MG Oral Tab Take 1 tablet (40 mg) by mouth every evening. For cholesterol 90 tablet 3   . Tamsulosin HCl 0.4 MG Oral Cap Take 1 capsule (0.4 mg) by mouth at bedtime. 90 capsule 3   . Tiotropium Bromide Monohydrate (SPIRIVA HANDIHALER) 18 MCG Inhalation Cap Inhale the contents of 1 capsule via Handihaler once daily. After  breathing out, inhale a second time. 90 capsule 2   . Triamcinolone Acetonide 0.1 % External Ointment Apply 1 application topically 2 times a day. Apply to affected areas bid 80 g 1     No facility-administered medications prior to visit.     HX:     Mr. Moya was previously followed by Dr. Maxcine Ham for severe GOLD IV COPD.  He has not been seen in over a year but has been doing relatively well.  He has had no hospitalizations nor exacerbations of COPD. He coughs up phlegm only occasionally.  He wears oxygen 24 hours per day and is using albuterol rescue inhaler at most once per day.  He has just retired from an office job.      The patient was a longterm smoker but quit in 2000.  He previously was also a heavy drinker but has been sober for 27 years and goes to [place] regularly.  He had an MI in 2013 and is followed by [name] regularly.            SH:     Social History     Social History   . Marital Status: Married     Spouse Name: N/A   . Number of Children: 5   . Years of Education: N/A     Occupational History   . STAFF      Purchasing     Social History Main Topics   . Smoking status: Former Smoker -- 1.00 packs/day for 40 years     Types: Cigarettes     Quit date: 05/11/1998   . Smokeless tobacco: Never Used      Comment: quit three years ago   . Alcohol Use: No      Comment: quit 17 years ago   . Drug Use: No   . Sexual Activity:     Partners: Female      Comment: no protection     Other Topics Concern   . Not on file     Social History Narrative    From sept 2011 note from Dr. Claybon Jabs, cardiology:  He has been married 3 times, 25 years to his current wife.  He has 5 daughters living and well although two of his daughters are rather overweight. He is working as a Water quality scientist. He smoked one package of cigarettes a day from the age of 61 years until about 10 years ago, although for the last 5 years of smoking, he was down to about half a package of cigarettes a day.  He is an admitted alcoholic and quit  drinking in 1981, at which time he attended [place] meetings but not any more.        1.24.2012: Married to [employer]. 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 Plans on spending more time woodworking. Goes to gym--exercises 20-30 mins every other day. Ct    -- Considering  working for [employer]         FMHx:     Family History   Problem Relation Age of Onset   . Cancer Father      Prostate   . Arthritis Father    . Other Father      Chronic Kidney Disease   . Psych Mother      Alzheimer's Disease   . Cancer Brother          ROS:   Constitutional: Negative    Eyes: Negative    Ears, Nose, Mouth, Throat: Negative    Cardiovascular: As noted in HPI above   Respiratory: As noted in HPI above   Gastrointestinal: Negative   Genitourinary: Negative   Musculoskeletal: Negative    Skin: Negative    Neurological: Negative    Psychiatric: Negative    Endocrine: Negative    Hematologic/Lymphatic: Negative   Allergic/Immunologic: Negative     PE:     BP 108/54 mmHg  Pulse 68  Temp(Src) 97.9 F (36.6 C) (Temporal)  Ht 5' 10.08" (1.78 m)  Wt 211 lb 14.4 oz (96.117 kg)  BMI 30.34 kg/m2  SpO2 96%  GEN: Appears well, wearing oxygen.  Speaks in complete sentences without dyspnea.  Lungs:Markedly diminished BS bilaterally.  No wheezes, rales or rhonchi.    Heart: No JVD.  S1 and S2 without S3 or M.  Heart sounds are distant.   Abd: mildly obese,  Benign.   Ext: Warm with trace edema bilaterally.     LAB:              Assessment/Plan:     # COPD    A: Mr. Bontrager is actually doing quite well.  His FEV1 in 2011 is reported to have been 33% predicted and is now 38% which is likely representing stability and not improvement.  He has had no ER visits or flares of his disease requiring steroids.  He is on a LABA and LAMA as well as an inhaled steroid.  His use of prn albuterol is low.   I see no need to change any of his medications.  He should get flu shot in the fall.  He has had pneumovax/prevnar.    Of note,  the patient is likely not a good candidate for LVRS as his emphysema is not terribly heterogeneous.     P:   1. Continue current meds.   2. Follow-up in 6 months.       # Small lung nodules    A: The patient has had two CTs in the past with small nodules on them have been migratory and likely inflammatory.  He is having a CT next month to follow up on AAA and lung nodules.  He is potentially a candidate for lung cancer screening although potential surgical interventions would be very high risk for him.  I will discuss at length with him at next visit.

## 2015-01-15 ENCOUNTER — Ambulatory Visit
Payer: No Typology Code available for payment source | Attending: Cardiovascular Disease | Admitting: Cardiovascular Disease

## 2015-01-15 ENCOUNTER — Ambulatory Visit (INDEPENDENT_AMBULATORY_CARE_PROVIDER_SITE_OTHER): Payer: No Typology Code available for payment source | Admitting: Family Medicine

## 2015-01-15 VITALS — BP 120/60 | HR 76 | Ht 70.0 in | Wt 212.0 lb

## 2015-01-15 VITALS — BP 136/67 | HR 71 | Temp 97.6°F | Resp 16 | Wt 212.0 lb

## 2015-01-15 DIAGNOSIS — R911 Solitary pulmonary nodule: Secondary | ICD-10-CM

## 2015-01-15 DIAGNOSIS — E785 Hyperlipidemia, unspecified: Secondary | ICD-10-CM

## 2015-01-15 DIAGNOSIS — F1099 Alcohol use, unspecified with unspecified alcohol-induced disorder: Secondary | ICD-10-CM

## 2015-01-15 DIAGNOSIS — E7800 Pure hypercholesterolemia, unspecified: Secondary | ICD-10-CM

## 2015-01-15 DIAGNOSIS — F1021 Alcohol dependence, in remission: Secondary | ICD-10-CM

## 2015-01-15 DIAGNOSIS — J431 Panlobular emphysema: Secondary | ICD-10-CM

## 2015-01-15 DIAGNOSIS — I714 Abdominal aortic aneurysm, without rupture, unspecified: Secondary | ICD-10-CM

## 2015-01-15 DIAGNOSIS — I25119 Atherosclerotic heart disease of native coronary artery with unspecified angina pectoris: Secondary | ICD-10-CM | POA: Insufficient documentation

## 2015-01-15 DIAGNOSIS — I712 Thoracic aortic aneurysm, without rupture, unspecified: Secondary | ICD-10-CM

## 2015-01-15 DIAGNOSIS — I10 Essential (primary) hypertension: Secondary | ICD-10-CM

## 2015-01-15 NOTE — Progress Notes (Signed)
Reason for Visit: follow up    Refills? NO  Referral? YES  Letter or Form? NO  Lab Results? NO    HEALTH MAINTENANCE:  Has the patient had this done since their last visit?  Cervical screening/PAP: N/A  Mammo: N/A  Colon Screen: Stool Card of Colonoscopy: Colonoscopy   Location & Date: [facility] 2015   Recommended Follow-up: 5 years    Have you seen a specialist since your last visit: Yes: Dr. Sampson-Cardiology    Vaccines Due? Yes, Zoster-medicare    HM Due:   Health Maintenance   Topic Date Due   . Colonoscopy  09/26/1976   . Zoster Vaccine  09/26/1996   . Influenza Vaccine (1) 12/27/2014   . Cholesterol Test  06/07/2017   . Tetanus Vaccine  05/20/2020   . Pneumococcal Vaccine  Completed       PCP Verified?  Yes, Tanner, Deitra Mayo, MD

## 2015-01-15 NOTE — Patient Instructions (Addendum)
Think about the shingles (zoster) vaccine--at your local pharmacy (maybe when you get the flu shot!)

## 2015-01-15 NOTE — Progress Notes (Signed)
S:  Patient is here for follow-up blood pressure and lung lesion and a descending thoracic aneurysm.    BP has been good, sometimes it is low-- mostly in one hundred teens or low twenties and over 50-60  No symptoms of lightheadedness  Not too high  Today is the highest in a long time  No complaints of chest pain    Saw Dr. Jari Pigg in [facility] clinic yesterday  He apparently is doing quite well from a pulmonary standpoint, and PFTs reveal that his condition is essentially stable  No new medications were recommended    This past Friday--noted a red spot in the right eye--has appearance of a subconjunctival hemorrhage  No visual changes  No other bleeding elsewhere    Healthcare maintenance:  Ct colonography done 2014--repeat due 2019  Will be getting flu shot at pharmacy    Re-alcohol  40 ish days of sobriety  Still attending [place] meetings very regularly  He is happy with his successes    Re-need for CT to follow-up lung lesion:  Some phlegm in the morning and a bit during the day  Cough is not too bad  SOB with exertion--pretty stable and can recover quite quickly  appetite is good  Last CT done in May 2016 showed some improvement in the lung mass.  He requires a follow-up CT  He does have a history of pulmonary nodules    Re-need for CT to follow up ascending thoracic aneurysm  He is due for follow-up  He does have an appointment with his cardiologist, Dr. Claybon Jabs this afternoon    Patient Active Problem List   Diagnosis   . HYPERTENSION NOS(aka HTN)   . Pure hypercholesterolemia   . MALIG NEOPLASM DORSAL TONGUE(aka CANCER)   . Chronic Kidney Disease -- Stage III   . Tobacco use disorder   . Unspecified hearing loss   . ECZEMA    . ERECTILE DYSFUNCTION   . Benign neoplasm of colon   . Calculus of kidney   . Panlobular emphysema ([place])   . Thoracic aneurysm without mention of rupture   . AAA (abdominal aortic aneurysm) ([place])   . Ventricular septal defect   . Hypertrophy of prostate without urinary obstruction and other  lower urinary tract symptoms (LUTS)   . Generalized osteoarthrosis, unspecified site   . Diverticulitis of colon (without mention of hemorrhage)   . Routine general medical examination at a health care facility   . Lumbago   . Malignant neoplasm of prostate ([place])   . Coronary artery disease involving native coronary artery of native heart with angina pectoris ([place])   . Lumbar stenosis with neurogenic claudication   . Major depressive disorder with single episode, in full remission ([place])   . History of non-ST elevation myocardial infarction (NSTEMI)   . Alcohol use disorder, moderate, in sustained remission ([place])   . Carotid stenosis   . Falls   . Pneumothorax on right   . Right rib fracture   . Vocal fold atrophy   . ventricular polyps   . Dysphonia     Outpatient Prescriptions Prior to Visit   Medication Sig Dispense Refill   . Acamprosate Calcium 333 MG Oral Tab EC TAKE 1 TABLET THREE TIMES A DAY 270 tablet 0   . Albuterol Sulfate HFA 108 (90 BASE) MCG/ACT Inhalation Aero Soln Inhale 2 puffs by mouth every 4 hours as needed for shortness of breath/wheezing. For wheezing and/or cough. 1 Inhaler 12   . Aspirin  81 MG Oral Tab 1 tablet by mouth once daily     . Budesonide-Formoterol Fumarate 160-4.5 MCG/ACT Inhalation Aerosol Inhale 2 puffs by mouth every 12 hours. 3 Inhaler 4   . Citalopram Hydrobromide 10 MG Oral Tab 1 tab qd x 1 month and then 1 tab every other day x 1 month then dc (was on 20 mg tabs before)     . Cyanocobalamin (B-12) 100 MCG Oral Tab Take 100 mcg by mouth.     . Furosemide 20 MG Oral Tab Take 1 tablet (20 mg) by mouth daily. 90 tablet 3   . Metoprolol Succinate ER 100 MG Oral TABLET SR 24 HR Take 1 tablet (100 mg) by mouth daily. (Dose per discharge summary on 01/20/13) 90 tablet 3   . Multiple Vitamins-Minerals (MULTIVITAMIN OR) Once daily     . NIFEdipine ER (AFEDITAB CR) 30 MG Oral TABLET SR 24 HR Take 1 tablet (30 mg) by mouth daily. 90 tablet 4   . Pantoprazole Sodium 40 MG Oral Tab EC Take  1 tablet (40 mg) by mouth every morning. 90 tablet 3   . Simvastatin 40 MG Oral Tab Take 1 tablet (40 mg) by mouth every evening. For cholesterol 90 tablet 3   . Tamsulosin HCl 0.4 MG Oral Cap Take 1 capsule (0.4 mg) by mouth at bedtime. 90 capsule 3   . Tiotropium Bromide Monohydrate (SPIRIVA HANDIHALER) 18 MCG Inhalation Cap Inhale the contents of 1 capsule via Handihaler once daily. After breathing out, inhale a second time. 90 capsule 2   . Triamcinolone Acetonide 0.1 % External Ointment Apply 1 application topically 2 times a day. Apply to affected areas bid 80 g 1     No facility-administered medications prior to visit.     O:  Pt alert and cooperative and in NAD.  Wearing O2 by nasal cannula  BP 136/67 mmHg  Pulse 71  Temp(Src) 97.6 F (36.4 C) (Temporal)  Resp 16  Wt 212 lb (96.163 kg)  SpO2 96%  Chest: Air entry diminished bilaterally but no crackles or wheezes were heard  Normal heart rate, Normal rhythm, Normal S1, Normal S2, No murmurs, No rubs, No gallops.    A/P:  (R91.1) Pulmonary nodule  (primary encounter diagnosis)  Plan: CT CHEST W/O CONTRAST     (I71.2) Thoracic aneurysm without mention of rupture  Plan: CT CHEST W/O CONTRAST  We will get follow-up chest CT    (J43.1) Panlobular emphysema (HCC)  Plan: Continue on current medications    (I10) HYPERTENSION NOS(aka HTN)  Plan: He will continue with his current medications which we reviewed.  See Epic list.  He is no longer taking benazepril, and blood pressure appears to be controlled    (F10.99) Alcohol use disorder, moderate, in sustained remission ([place])  Plan: Congratulated him on his successes in this department    Regarding healthcare maintenance issues  He will get a zoster vaccine at his local pharmacy  The importance of regular flu shots was discussed with him--it may be better for him to get this a little later in the year to make sure that the effect of the immunization does not [place] at the time of the flu season    Return to clinic  in several months or sooner when necessary    Levora Angel, MD

## 2015-01-16 ENCOUNTER — Ambulatory Visit: Payer: No Typology Code available for payment source | Attending: Family Medicine

## 2015-01-16 DIAGNOSIS — R911 Solitary pulmonary nodule: Secondary | ICD-10-CM

## 2015-01-16 DIAGNOSIS — I712 Thoracic aortic aneurysm, without rupture: Secondary | ICD-10-CM

## 2015-01-17 NOTE — Progress Notes (Signed)
SHAMON, COTHRAN          [id number]          01/15/2015      CARDIOLOGY CLINIC NOTE       DATE OF SERVICE     01/15/2015.    [name] continues to be on constant oxygen at 2 L per minute.  He gets short of breath walking two blocks.  He has no orthopnea or paroxysmal nocturnal dyspnea and no chest pain.  He says he has occasional right lateral chest pain when he is coughing and overall his energy is down.  He has not been drinking alcohol recently.    PHYSICAL EXAMINATION     VITAL SIGNS:  Blood pressure 120/60 sitting, 124/70 standing, pulse 76 per minute.  Weight 212 pounds, which is up 7 pounds over the last 3 months.  No increased systemic venous pressure.  Grade 1 systolic carotid bruits bilaterally.  LUNGS:  Clear.  HEART:  Regular rhythm.  There is a grade 2 aortic systolic ejection murmur.  No gallops, heaves or percussive cardiomegaly.  ABDOMEN:  Obese, no masses felt.  EXTREMITIES:  No peripheral edema.  Good pedal pulses.    ASSESSMENT AND PLAN     In September 2015 his BNP was 940 picograms %. When we saw him last June, it was down, but still elevated at 570 picograms % and we switched him from hydrochlorothiazide to furosemide 20 mg a day.  We will recheck a BNP the next time we see him but right now he is to continue on the medications as outlined.

## 2015-01-18 ENCOUNTER — Encounter (INDEPENDENT_AMBULATORY_CARE_PROVIDER_SITE_OTHER): Payer: Self-pay | Admitting: Family Medicine

## 2015-01-18 ENCOUNTER — Encounter (HOSPITAL_BASED_OUTPATIENT_CLINIC_OR_DEPARTMENT_OTHER): Payer: Self-pay | Admitting: Cardiovascular Disease

## 2015-02-05 ENCOUNTER — Other Ambulatory Visit: Payer: Self-pay

## 2015-02-12 ENCOUNTER — Encounter (INDEPENDENT_AMBULATORY_CARE_PROVIDER_SITE_OTHER): Payer: Self-pay | Admitting: Family Medicine

## 2015-03-21 ENCOUNTER — Inpatient Hospital Stay
Admission: EM | Admit: 2015-03-21 | Discharge: 2015-03-26 | DRG: 189 | Disposition: A | Discharge status: Home / Self Care | Payer: No Typology Code available for payment source | Attending: Internal Medicine | Admitting: Internal Medicine

## 2015-03-21 DIAGNOSIS — N183 Chronic kidney disease, stage 3 (moderate): Secondary | ICD-10-CM | POA: Diagnosis present

## 2015-03-21 DIAGNOSIS — I129 Hypertensive chronic kidney disease with stage 1 through stage 4 chronic kidney disease, or unspecified chronic kidney disease: Secondary | ICD-10-CM | POA: Diagnosis present

## 2015-03-21 DIAGNOSIS — K565 Intestinal adhesions [bands] with obstruction (postprocedural) (postinfection): Secondary | ICD-10-CM | POA: Diagnosis present

## 2015-03-21 DIAGNOSIS — I2510 Atherosclerotic heart disease of native coronary artery without angina pectoris: Secondary | ICD-10-CM | POA: Diagnosis present

## 2015-03-21 DIAGNOSIS — N179 Acute kidney failure, unspecified: Secondary | ICD-10-CM | POA: Diagnosis present

## 2015-03-21 DIAGNOSIS — J9621 Acute and chronic respiratory failure with hypoxia: Principal | ICD-10-CM | POA: Diagnosis present

## 2015-03-21 DIAGNOSIS — K5900 Constipation, unspecified: Secondary | ICD-10-CM | POA: Diagnosis present

## 2015-03-21 DIAGNOSIS — I248 Other forms of acute ischemic heart disease: Secondary | ICD-10-CM | POA: Diagnosis present

## 2015-03-21 DIAGNOSIS — N400 Enlarged prostate without lower urinary tract symptoms: Secondary | ICD-10-CM | POA: Diagnosis present

## 2015-03-21 DIAGNOSIS — A419 Sepsis, unspecified organism: Secondary | ICD-10-CM | POA: Diagnosis not present

## 2015-03-21 DIAGNOSIS — R6520 Severe sepsis without septic shock: Secondary | ICD-10-CM | POA: Diagnosis not present

## 2015-03-21 DIAGNOSIS — Z9981 Dependence on supplemental oxygen: Secondary | ICD-10-CM

## 2015-03-21 DIAGNOSIS — Z87891 Personal history of nicotine dependence: Secondary | ICD-10-CM

## 2015-03-21 DIAGNOSIS — J441 Chronic obstructive pulmonary disease with (acute) exacerbation: Secondary | ICD-10-CM | POA: Diagnosis present

## 2015-03-21 DIAGNOSIS — E785 Hyperlipidemia, unspecified: Secondary | ICD-10-CM | POA: Diagnosis present

## 2015-03-21 DIAGNOSIS — F1010 Alcohol abuse, uncomplicated: Secondary | ICD-10-CM | POA: Diagnosis present

## 2015-03-21 DIAGNOSIS — Z2821 Immunization not carried out because of patient refusal: Secondary | ICD-10-CM

## 2015-03-21 DIAGNOSIS — I214 Non-ST elevation (NSTEMI) myocardial infarction: Secondary | ICD-10-CM

## 2015-03-21 LAB — COMPREHENSIVE METABOLIC PANEL
ALT (GPT): 19 U/L (ref 10–48)
AST (GOT): 27 U/L (ref 9–38)
Albumin: 3.8 g/dL (ref 3.5–5.2)
Alkaline Phosphatase (Total): 55 U/L (ref 52–227)
Anion Gap: 13 — ABNORMAL HIGH (ref 4–12)
Bilirubin (Total): 0.8 mg/dL (ref 0.2–1.3)
Calcium: 9.3 mg/dL (ref 8.9–10.2)
Carbon Dioxide, Total: 25 meq/L (ref 22–32)
Chloride: 97 meq/L — ABNORMAL LOW (ref 98–108)
Creatinine: 1.52 mg/dL — ABNORMAL HIGH (ref 0.51–1.18)
GFR, Calc, African American: 54 mL/min/{1.73_m2}
GFR, Calc, European American: 45 mL/min/{1.73_m2}
Glucose: 120 mg/dL (ref 62–125)
Potassium: 4.2 meq/L (ref 3.6–5.2)
Protein (Total): 7.1 g/dL (ref 6.0–8.2)
Sodium: 135 meq/L (ref 135–145)
Urea Nitrogen: 25 mg/dL — ABNORMAL HIGH (ref 8–21)

## 2015-03-21 LAB — CBC, DIFF
% Basophils: 0 %
% Eosinophils: 0 %
% Immature Granulocytes: 1 %
% Lymphocytes: 13 %
% Monocytes: 7 %
% Neutrophils: 79 %
% Nucleated RBC: 0 %
Absolute Eosinophil Count: 0 10*3/uL (ref 0.00–0.50)
Absolute Lymphocyte Count: 1.5 10*3/uL (ref 1.00–4.80)
Basophils: 0.03 10*3/uL (ref 0.00–0.20)
Hematocrit: 34 % — ABNORMAL LOW (ref 38–50)
Hemoglobin: 11.9 g/dL — ABNORMAL LOW (ref 13.0–18.0)
Immature Granulocytes: 0.1 10*3/uL — ABNORMAL HIGH (ref 0.00–0.05)
MCH: 32.1 pg (ref 27.3–33.6)
MCHC: 34.6 g/dL (ref 32.2–36.5)
MCV: 93 fL (ref 81–98)
Monocytes: 0.84 10*3/uL — ABNORMAL HIGH (ref 0.00–0.80)
Neutrophils: 9.44 10*3/uL — ABNORMAL HIGH (ref 1.80–7.00)
Nucleated RBC: 0 10*3/uL
Platelet Count: 135 10*3/uL — ABNORMAL LOW (ref 150–400)
RBC: 3.71 10*6/uL — ABNORMAL LOW (ref 4.40–5.60)
RDW-CV: 13.2 % (ref 11.6–14.4)
WBC: 11.91 10*3/uL — ABNORMAL HIGH (ref 4.3–10.0)

## 2015-03-21 LAB — MAGNESIUM: Magnesium: 1.6 mg/dL — ABNORMAL LOW (ref 1.8–2.4)

## 2015-03-21 LAB — PROCALCITONIN (NWH): Procalcitonin: 0.3 ng/mL (ref 0–0.49)

## 2015-03-21 LAB — INFLUENZA A, B AND RSV, RAPID PCR
Rapid Influenza A PCR Result: NEGATIVE
Rapid Influenza B PCR Result: NEGATIVE
Rapid RSV PCR Result: NEGATIVE

## 2015-03-21 LAB — BLOOD GAS, VENOUS, W/ HGB
Base Excess, Blood, VEN: 2.5 meq/L (ref 0.0–3.0)
Bicarbonate, VEN: 26 meq/L (ref 23–27)
Hematocrit: 36 % — ABNORMAL LOW (ref 38–50)
Hemoglobin: 12.4 g/dL — ABNORMAL LOW (ref 13.0–18.0)
O2 Saturation, VEN: 83 % — ABNORMAL HIGH (ref 70–75)
pCO2, VEN: 37 mmHg — ABNORMAL LOW (ref 42–50)
pH, VEN: 7.47 — ABNORMAL HIGH (ref 7.32–7.40)
pO2, VEN: 48 mmHg — ABNORMAL HIGH (ref 35–40)

## 2015-03-21 LAB — B_TYPE NATRIURETIC PEPTIDE: B_Type Natriuretic Peptide: 469 pg/mL — ABNORMAL HIGH (ref <101)

## 2015-03-21 LAB — LIPASE: Lipase: 5 U/L (ref <70)

## 2015-03-21 LAB — LAB ADD ON ORDER

## 2015-03-21 LAB — TROPONIN_I
Troponin_I Interpretation: ELEVATED
Troponin_I: 0.08 ng/mL — ABNORMAL HIGH (ref <0.04)

## 2015-03-21 LAB — L LACTATE, VENOUS WB (TO U/H LAB WITHIN 30 MIN): L Lactate (Direct), Venous Whole Blood: 2.8 mmol/L — ABNORMAL HIGH (ref 0.6–1.9)

## 2015-03-22 LAB — CBC, DIFF
% Basophils: 0 %
% Eosinophils: 0 %
% Immature Granulocytes: 1 %
% Lymphocytes: 6 %
% Monocytes: 1 %
% Neutrophils: 92 %
% Nucleated RBC: 0 %
Absolute Eosinophil Count: 0 10*3/uL (ref 0.00–0.50)
Absolute Lymphocyte Count: 0.58 10*3/uL — ABNORMAL LOW (ref 1.00–4.80)
Basophils: 0.01 10*3/uL (ref 0.00–0.20)
Hematocrit: 33 % — ABNORMAL LOW (ref 38–50)
Hemoglobin: 11.1 g/dL — ABNORMAL LOW (ref 13.0–18.0)
Immature Granulocytes: 0.06 10*3/uL — ABNORMAL HIGH (ref 0.00–0.05)
MCH: 31.3 pg (ref 27.3–33.6)
MCHC: 33.5 g/dL (ref 32.2–36.5)
MCV: 93 fL (ref 81–98)
Monocytes: 0.1 10*3/uL (ref 0.00–0.80)
Neutrophils: 8.66 10*3/uL — ABNORMAL HIGH (ref 1.80–7.00)
Nucleated RBC: 0 10*3/uL
Platelet Count: 134 10*3/uL — ABNORMAL LOW (ref 150–400)
RBC: 3.55 10*6/uL — ABNORMAL LOW (ref 4.40–5.60)
RDW-CV: 13.1 % (ref 11.6–14.4)
WBC: 9.41 10*3/uL (ref 4.3–10.0)

## 2015-03-22 LAB — TROPONIN_I
Troponin_I Interpretation: ELEVATED
Troponin_I Interpretation: NORMAL
Troponin_I: 0.05 ng/mL — ABNORMAL HIGH (ref <0.04)
Troponin_I: 0.03 ng/mL (ref <0.04)

## 2015-03-22 LAB — BASIC METABOLIC PANEL
Anion Gap: 6 (ref 4–12)
Calcium: 9.3 mg/dL (ref 8.9–10.2)
Carbon Dioxide, Total: 31 meq/L (ref 22–32)
Chloride: 97 meq/L — ABNORMAL LOW (ref 98–108)
Creatinine: 1.44 mg/dL — ABNORMAL HIGH (ref 0.51–1.18)
GFR, Calc, African American: 58 mL/min/{1.73_m2}
GFR, Calc, European American: 47 mL/min/{1.73_m2}
Glucose: 229 mg/dL — ABNORMAL HIGH (ref 62–125)
Potassium: 4 meq/L (ref 3.6–5.2)
Sodium: 134 meq/L — ABNORMAL LOW (ref 135–145)
Urea Nitrogen: 28 mg/dL — ABNORMAL HIGH (ref 8–21)

## 2015-03-22 LAB — MAGNESIUM: Magnesium: 2.2 mg/dL (ref 1.8–2.4)

## 2015-03-22 LAB — IONIZED CALCIUM, SERUM: Ionized Calcium, Serum: 1.16 mmol/L — ABNORMAL LOW (ref 1.18–1.38)

## 2015-03-22 LAB — PHOSPHATE: Phosphate: 2.1 mg/dL — ABNORMAL LOW (ref 2.5–4.5)

## 2015-03-23 ENCOUNTER — Encounter (INDEPENDENT_AMBULATORY_CARE_PROVIDER_SITE_OTHER): Payer: Self-pay | Admitting: Family Medicine

## 2015-03-23 LAB — COMPREHENSIVE METABOLIC PANEL
ALT (GPT): 16 U/L (ref 10–48)
AST (GOT): 22 U/L (ref 9–38)
Albumin: 3.7 g/dL (ref 3.5–5.2)
Alkaline Phosphatase (Total): 46 U/L — ABNORMAL LOW (ref 52–227)
Anion Gap: 11 (ref 4–12)
Bilirubin (Total): 0.8 mg/dL (ref 0.2–1.3)
Calcium: 9.9 mg/dL (ref 8.9–10.2)
Carbon Dioxide, Total: 25 meq/L (ref 22–32)
Chloride: 95 meq/L — ABNORMAL LOW (ref 98–108)
Creatinine: 1.87 mg/dL — ABNORMAL HIGH (ref 0.51–1.18)
GFR, Calc, African American: 43 mL/min/{1.73_m2}
GFR, Calc, European American: 35 mL/min/{1.73_m2}
Glucose: 156 mg/dL — ABNORMAL HIGH (ref 62–125)
Potassium: 4.1 meq/L (ref 3.6–5.2)
Protein (Total): 7.2 g/dL (ref 6.0–8.2)
Sodium: 131 meq/L — ABNORMAL LOW (ref 135–145)
Urea Nitrogen: 34 mg/dL — ABNORMAL HIGH (ref 8–21)

## 2015-03-23 LAB — CBC, DIFF
% Basophils: 0 %
% Eosinophils: 0 %
% Immature Granulocytes: 0 %
% Lymphocytes: 6 %
% Monocytes: 6 %
% Neutrophils: 88 %
% Nucleated RBC: 0 %
Absolute Eosinophil Count: 0 10*3/uL (ref 0.00–0.50)
Absolute Lymphocyte Count: 0.8 10*3/uL — ABNORMAL LOW (ref 1.00–4.80)
Basophils: 0 10*3/uL (ref 0.00–0.20)
Hematocrit: 36 % — ABNORMAL LOW (ref 38–50)
Hemoglobin: 12 g/dL — ABNORMAL LOW (ref 13.0–18.0)
Immature Granulocytes: 0 10*3/uL (ref 0.00–0.05)
MCH: 31.8 pg (ref 27.3–33.6)
MCHC: 33.6 g/dL (ref 32.2–36.5)
MCV: 95 fL (ref 81–98)
Monocytes: 0.8 10*3/uL (ref 0.00–0.80)
Neutrophils: 11.71 10*3/uL — ABNORMAL HIGH (ref 1.80–7.00)
Nucleated RBC: 0.03 10*3/uL — ABNORMAL HIGH
Platelet Count: 147 10*3/uL — ABNORMAL LOW (ref 150–400)
RBC: 3.77 10*6/uL — ABNORMAL LOW (ref 4.40–5.60)
RDW-CV: 13.5 % (ref 11.6–14.4)
WBC: 13.31 10*3/uL — ABNORMAL HIGH (ref 4.3–10.0)

## 2015-03-23 LAB — TROPONIN_I
Troponin_I Interpretation: ELEVATED
Troponin_I: 0.04 ng/mL — ABNORMAL HIGH (ref <0.04)

## 2015-03-23 LAB — L LACTATE, VENOUS WB (TO U/H LAB WITHIN 30 MIN)
L Lactate (Direct), Venous Whole Blood: 3.9 mmol/L — ABNORMAL HIGH (ref 0.6–1.9)
L Lactate (Direct), Venous Whole Blood: 1.5 mmol/L (ref 0.6–1.9)

## 2015-03-24 LAB — CBC, DIFF
% Basophils: 0 %
% Eosinophils: 0 %
% Immature Granulocytes: 0 %
% Lymphocytes: 5 %
% Monocytes: 3 %
% Neutrophils: 92 %
% Nucleated RBC: 0 %
Absolute Eosinophil Count: 0 10*3/uL (ref 0.00–0.50)
Absolute Lymphocyte Count: 0.45 10*3/uL — ABNORMAL LOW (ref 1.00–4.80)
Basophils: 0 10*3/uL (ref 0.00–0.20)
Hematocrit: 30 % — ABNORMAL LOW (ref 38–50)
Hemoglobin: 10.1 g/dL — ABNORMAL LOW (ref 13.0–18.0)
Immature Granulocytes: 0 10*3/uL (ref 0.00–0.05)
MCH: 31.8 pg (ref 27.3–33.6)
MCHC: 33.6 g/dL (ref 32.2–36.5)
MCV: 95 fL (ref 81–98)
Monocytes: 0.27 10*3/uL (ref 0.00–0.80)
Neutrophils: 8.23 10*3/uL — ABNORMAL HIGH (ref 1.80–7.00)
Nucleated RBC: 0 10*3/uL
Platelet Count: 124 10*3/uL — ABNORMAL LOW (ref 150–400)
RBC: 3.18 10*6/uL — ABNORMAL LOW (ref 4.40–5.60)
RDW-CV: 13.2 % (ref 11.6–14.4)
WBC: 8.95 10*3/uL (ref 4.3–10.0)

## 2015-03-24 LAB — TROPONIN_I
Troponin_I Interpretation: ELEVATED
Troponin_I: 0.05 ng/mL — ABNORMAL HIGH (ref <0.04)

## 2015-03-24 LAB — BASIC METABOLIC PANEL
Anion Gap: 9 (ref 4–12)
Calcium: 8.8 mg/dL — ABNORMAL LOW (ref 8.9–10.2)
Carbon Dioxide, Total: 23 meq/L (ref 22–32)
Chloride: 101 meq/L (ref 98–108)
Creatinine: 1.65 mg/dL — ABNORMAL HIGH (ref 0.51–1.18)
GFR, Calc, African American: 49 mL/min/{1.73_m2}
GFR, Calc, European American: 41 mL/min/{1.73_m2}
Glucose: 129 mg/dL — ABNORMAL HIGH (ref 62–125)
Potassium: 3.9 meq/L (ref 3.6–5.2)
Sodium: 133 meq/L — ABNORMAL LOW (ref 135–145)
Urea Nitrogen: 32 mg/dL — ABNORMAL HIGH (ref 8–21)

## 2015-03-24 LAB — HOLD BLOOD CULTURE BOTTLES

## 2015-03-25 ENCOUNTER — Encounter (INDEPENDENT_AMBULATORY_CARE_PROVIDER_SITE_OTHER): Payer: Self-pay | Admitting: Family Medicine

## 2015-03-25 LAB — BASIC METABOLIC PANEL
Anion Gap: 9 (ref 4–12)
Calcium: 8.5 mg/dL — ABNORMAL LOW (ref 8.9–10.2)
Carbon Dioxide, Total: 25 meq/L (ref 22–32)
Chloride: 104 meq/L (ref 98–108)
Creatinine: 1.38 mg/dL — ABNORMAL HIGH (ref 0.51–1.18)
GFR, Calc, African American: >60 mL/min/{1.73_m2}
GFR, Calc, European American: 50 mL/min/{1.73_m2}
Glucose: 104 mg/dL (ref 62–125)
Potassium: 3.6 meq/L (ref 3.6–5.2)
Sodium: 138 meq/L (ref 135–145)
Urea Nitrogen: 29 mg/dL — ABNORMAL HIGH (ref 8–21)

## 2015-03-25 LAB — CBC, DIFF
% Basophils: 0 %
% Eosinophils: 0 %
% Immature Granulocytes: 1 %
% Lymphocytes: 6 %
% Monocytes: 6 %
% Neutrophils: 87 %
% Nucleated RBC: 0 %
Absolute Eosinophil Count: 0 10*3/uL (ref 0.00–0.50)
Absolute Lymphocyte Count: 0.47 10*3/uL — ABNORMAL LOW (ref 1.00–4.80)
Basophils: 0.01 10*3/uL (ref 0.00–0.20)
Hematocrit: 29 % — ABNORMAL LOW (ref 38–50)
Hemoglobin: 9.7 g/dL — ABNORMAL LOW (ref 13.0–18.0)
Immature Granulocytes: 0.07 10*3/uL — ABNORMAL HIGH (ref 0.00–0.05)
MCH: 31.7 pg (ref 27.3–33.6)
MCHC: 33.2 g/dL (ref 32.2–36.5)
MCV: 95 fL (ref 81–98)
Monocytes: 0.48 10*3/uL (ref 0.00–0.80)
Neutrophils: 6.53 10*3/uL (ref 1.80–7.00)
Nucleated RBC: 0 10*3/uL
Platelet Count: 126 10*3/uL — ABNORMAL LOW (ref 150–400)
RBC: 3.06 10*6/uL — ABNORMAL LOW (ref 4.40–5.60)
RDW-CV: 13.3 % (ref 11.6–14.4)
WBC: 7.56 10*3/uL (ref 4.3–10.0)

## 2015-03-25 NOTE — Telephone Encounter (Signed)
Routing to Dr. Satira Sark.  Pt's wife ecared to inform that he pt had numerous labs done at [facility].  A number of them came back with abnormal results

## 2015-03-25 NOTE — Telephone Encounter (Signed)
Pt's wife ecared through her account and included the following information:    FYI - Please refer to my husbands Tracy Conway) records regarding a hospital stay @ [facility] beginning on 11/24 (Thanksgiving).    I have been in [place] since 11/19 and am scheduled to return on the evening of 11/28. I have had no conversations with any doctors who may have treated him, and am not aware of his status today, if he's been released, or when he may be released.    By the way, please note that his alcohol use is NOT in remission. He is currently a practicing alcoholic. which he will fully deny if asked.

## 2015-03-26 LAB — CBC, DIFF
% Basophils: 0 %
% Eosinophils: 0 %
% Immature Granulocytes: 1 %
% Lymphocytes: 9 %
% Monocytes: 8 %
% Neutrophils: 82 %
% Nucleated RBC: 0 %
Absolute Eosinophil Count: 0.01 10*3/uL (ref 0.00–0.50)
Absolute Lymphocyte Count: 0.71 10*3/uL — ABNORMAL LOW (ref 1.00–4.80)
Basophils: 0.03 10*3/uL (ref 0.00–0.20)
Hematocrit: 32 % — ABNORMAL LOW (ref 38–50)
Hemoglobin: 10.7 g/dL — ABNORMAL LOW (ref 13.0–18.0)
Immature Granulocytes: 0.11 10*3/uL — ABNORMAL HIGH (ref 0.00–0.05)
MCH: 31.8 pg (ref 27.3–33.6)
MCHC: 33.3 g/dL (ref 32.2–36.5)
MCV: 96 fL (ref 81–98)
Monocytes: 0.65 10*3/uL (ref 0.00–0.80)
Neutrophils: 6.54 10*3/uL (ref 1.80–7.00)
Nucleated RBC: 0.02 10*3/uL — ABNORMAL HIGH
Platelet Count: 146 10*3/uL — ABNORMAL LOW (ref 150–400)
RBC: 3.36 10*6/uL — ABNORMAL LOW (ref 4.40–5.60)
RDW-CV: 13.4 % (ref 11.6–14.4)
WBC: 8.05 10*3/uL (ref 4.3–10.0)

## 2015-03-26 LAB — BASIC METABOLIC PANEL
Anion Gap: 11 (ref 4–12)
Calcium: 8.8 mg/dL — ABNORMAL LOW (ref 8.9–10.2)
Carbon Dioxide, Total: 25 meq/L (ref 22–32)
Chloride: 105 meq/L (ref 98–108)
Creatinine: 1.42 mg/dL — ABNORMAL HIGH (ref 0.51–1.18)
GFR, Calc, African American: 58 mL/min/{1.73_m2}
GFR, Calc, European American: 48 mL/min/{1.73_m2}
Glucose: 92 mg/dL (ref 62–125)
Potassium: 3.4 meq/L — ABNORMAL LOW (ref 3.6–5.2)
Sodium: 141 meq/L (ref 135–145)
Urea Nitrogen: 25 mg/dL — ABNORMAL HIGH (ref 8–21)

## 2015-03-26 NOTE — Telephone Encounter (Signed)
Noted! Thank you

## 2015-03-27 ENCOUNTER — Telehealth (INDEPENDENT_AMBULATORY_CARE_PROVIDER_SITE_OTHER): Payer: Self-pay | Admitting: Family Medicine

## 2015-03-27 NOTE — Telephone Encounter (Signed)
Post-Discharge Communication within 2 business days    Hospital Name:  [facility] Admission date:  03/21/15  Hospital Discharge date:  03/26/15  Reason for Hospitalization:  Acute hypoxic chronic respiratory failure  Discharge Provider:  Dr. Myrna Blazer    Contact within two business days of discharge date:  YES  Contact type:  Telephone  Scheduled patient for 7th day (high complexity condition such as stroke or MI) or 14th day office visit with provider:  YES .    Appointment Date:  04/09/15    Review of Discharge Instructions: YES   Patient Understands instructions: YES  Referrals, home health or community services planned on discharge:  NO.    Services received or scheduled:  N/A  Medications on discharge from hospital:  Mira lax, Flagyl, DOS, Cefdinir, Albuterol, ASA, Benazepril, Symbicort, Vitamin B12, Furosemide, Toprol-XL, Simvastatin, Flomax, Spiriva, Multivitamin, Protonix.          Taking medications as prescribed:  YES  Any side effects of medication reported:  NO    Patient reports he/she is able to care for self:  YES  Caregiver involved: NO.    Patient reports concerns:  NO.     Patient advised to call the clinic if any concerns noted prior to follow-up appointment with provider.    Royden Purl, RN  Contact made by: RN    RN only:   Assessment and support of treatment regimen adherence:  YES: ETOH management   New needs identified:  None   Education for self-management, independent living, ADLs  YES: TCM follow up   Further coordination of care needed:  YES: ETOH management and TCM follow up

## 2015-03-27 NOTE — Telephone Encounter (Signed)
Patient agrees to participate in TCM program.  No new allergies identified per patient.  Patient attending alcoholics anonymous meetings and will discuss ETOH rehab on 03/29/15 with provider as mentioned in d/c instruction.    RN to call back during week of 04/01/15.

## 2015-03-28 LAB — BLOOD C/S
Culture: NO GROWTH
Culture: NO GROWTH

## 2015-04-02 ENCOUNTER — Ambulatory Visit
Payer: No Typology Code available for payment source | Attending: Otolaryngology | Admitting: Speech-Language Pathologist

## 2015-04-02 ENCOUNTER — Ambulatory Visit: Payer: No Typology Code available for payment source | Attending: Otolaryngology | Admitting: Otolaryngology

## 2015-04-02 VITALS — BP 136/64 | HR 72 | Temp 97.4°F | Ht 71.0 in | Wt 218.0 lb

## 2015-04-02 DIAGNOSIS — R490 Dysphonia: Secondary | ICD-10-CM | POA: Insufficient documentation

## 2015-04-02 DIAGNOSIS — J387 Other diseases of larynx: Secondary | ICD-10-CM

## 2015-04-02 DIAGNOSIS — J383 Other diseases of vocal cords: Secondary | ICD-10-CM | POA: Insufficient documentation

## 2015-04-02 DIAGNOSIS — B3789 Other sites of candidiasis: Secondary | ICD-10-CM | POA: Insufficient documentation

## 2015-04-02 MED ORDER — NYSTATIN 100000 UNIT/ML MT SUSP
5.0000 mL | Freq: Three times a day (TID) | OROMUCOSAL | Status: DC
Start: 2015-04-02 — End: 2016-01-27

## 2015-04-02 NOTE — Progress Notes (Signed)
OUTPATIENT LARYNGOLOGY RETURN CLINIC VISIT April 02, 2015     PCP: Levora Angel, MD     Chief Complaint: Tracy Conway is a 80 year old year old male who returns to clinic for follow-up of bilateral ventricular polyps.    History of Present Illness:   Tracy Conway returns to clinic after 6 months for follow-up of bilateral ventricular polyps. Since his last visit, he has noticed very little change in his voice. He did not pursue speech therapy as recommended due to inconvenience of driving all the way to [place] (he currently lives in [place]). He was recently hospitalized for acute exacerbation of COPD and ileus; he was admitted for 6 days at [facility].     He continues to drink 3 glasses of water and 1 cup of coffee daily. He does occasionally cough and clear his throat. He has been taking pantoprazole 40mg  daily. He currently goes to [employer] twice daily.      LARYNGOLOGY QUESTIONNAIRE SCORES:   Reflux Symptom Index: 11  Voice Handicap Index: 20  Eating Assessment Tool: 0   Cough Index: 16   Dyspnea Index: 16    REVIEW OF SYSTEMS: A complete ROS was performed and positive for chronic shortness of breath with all other systems negative.     PAST MEDICAL HISTORY:   Past Medical History   Diagnosis Date   . Aortic aneurysm of unspecified site without mention of rupture (HCC)      Aortic Aneurysm   . MALIG NEOPLASM DORSAL TONGUE(aka CANCER) 03/29/2002     Ssurgery 1994, T1N1 squamous cell ca L tongue, partial glossectomy; L supraorbital hyoid neck dissection    . Tobacco use disorder 12/24/2005     Quit 2000.   Marland Kitchen Unspecified hearing loss 12/24/2005     Bilat hearing aids.   . ECZEMA  12/24/2005     Lower extremities in particular.   Marland Kitchen ERECTILE DYSFUNCTION 12/24/2005   . Benign neoplasm of colon 12/24/2005     Tubular adenoma. Dx'ed flex sig 2000--> colonoscopy 2000: N.  F/U 2003: mild diverticulosis, tubular adenoma.  F/U due 2008 with CT colonography.   . Other specified disorders resulting from impaired renal function  07/22/2005     Creat ~ 1.2   . Calculus of kidney 12/24/2005     Lithotripsy 1988   . 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 Thoracic aneurysm without mention of rupture ([place]) 12/24/2005     Ascending aortic aneurysm.  Followed by Dr. Ralene Cork, Cardiac surgery.  Gets regular CT scans.  Goal systolic BP: 99991111   . Abdominal aneurysm without mention of rupture ([place]) 12/24/2005     Borderline, seen on CT 8/06.   . Ventricular septal defect 12/24/2005     Small muscular VSD--NEEDS SBE PROPHYLAXIS. (Clindamycin 600 mg prior to dental visits.)   . Hypertrophy of prostate without urinary obstruction and other lower urinary tract symptoms (LUTS) 12/24/2005   . Generalized osteoarthrosis, unspecified site 01/07/2007     Has seen orthopedics--cortisone shot to L knee helped.   . Diverticulitis of colon (without mention of hemorrhage) 01/07/2007     Hospitalized at [facility] May 2008   . Unspecified disorder of muscle, ligament, and fascia    . Sleep apnea       Patient Active Problem List   Diagnosis   . HYPERTENSION NOS(aka HTN)   . Pure hypercholesterolemia   . MALIG NEOPLASM DORSAL TONGUE(aka CANCER)   . Chronic Kidney Disease -- Stage  III   . Tobacco use disorder   . Unspecified hearing loss   . ECZEMA    . ERECTILE DYSFUNCTION   . Benign neoplasm of colon   . Calculus of kidney   . Panlobular emphysema ([place])   . Thoracic aneurysm without mention of rupture   . AAA (abdominal aortic aneurysm) ([place])   . Ventricular septal defect   . Hypertrophy of prostate without urinary obstruction and other lower urinary tract symptoms (LUTS)   . Generalized osteoarthrosis, unspecified site   . Diverticulitis of colon (without mention of hemorrhage)   . Routine general medical examination at a health care facility   . Lumbago   . Malignant neoplasm of prostate ([place])   . Coronary artery disease involving native coronary artery of native heart with angina pectoris ([place])   . Lumbar stenosis with neurogenic claudication   . Major depressive disorder with  single episode, in full remission ([place])   . History of non-ST elevation myocardial infarction (NSTEMI)   . Alcohol use disorder, moderate, in sustained remission   . Carotid stenosis   . Falls   . Pneumothorax on right   . Right rib fracture   . Vocal fold atrophy   . ventricular polyps   . Dysphonia        PAST SURGICAL HISTORY:   Past Surgical History   Procedure Laterality Date   . Ventral hernia repair       x3   . Removal of tongue carcinoma with lymphadenectomy     . Fusion l foot distal phalange     . Lithotrypsy of r renal calculi     . Retinaculum release b/l wrists     . Vasectomy w/tray       x2   . Vasectomy reversal     . Rpr 1st ingun hrna age 77 mo-5 yrs reducible     . Revj tot knee arthrp fem&entire tibial compone       left knee   . Ligation prq vas deferens uni/bi spx     . Brachy therapy     . Lipotripsy surgery     . Heart stent placement  09/2010, 02/2011     [facility]   . Unlisted procedure spine  Mar 21 2012     L spine surgery         ALLERGIES:   Review of patient's allergies indicates:  Allergies   Allergen Reactions   . Penicillin G      hives     . Percocet [Oxycodone-Acetaminophen]      indigestion        MEDICATIONS:   Outpatient Prescriptions Prior to Visit   Medication Sig Dispense Refill   . Acamprosate Calcium 333 MG Oral Tab EC TAKE 1 TABLET THREE TIMES A DAY 270 tablet 0   . Albuterol Sulfate HFA 108 (90 BASE) MCG/ACT Inhalation Aero Soln Inhale 2 puffs by mouth every 4 hours as needed for shortness of breath/wheezing. For wheezing and/or cough. 1 Inhaler 12   . Aspirin 81 MG Oral Tab 1 tablet by mouth once daily     . Budesonide-Formoterol Fumarate 160-4.5 MCG/ACT Inhalation Aerosol Inhale 2 puffs by mouth every 12 hours. 3 Inhaler 4   . Cyanocobalamin (B-12) 100 MCG Oral Tab Take 100 mcg by mouth.     . Furosemide 20 MG Oral Tab Take 1 tablet (20 mg) by mouth daily. 90 tablet 3   . Metoprolol Succinate ER 100 MG Oral TABLET SR  24 HR Take 1 tablet (100 mg) by mouth daily. (Dose per  discharge summary on 01/20/13) 90 tablet 3   . Multiple Vitamins-Minerals (MULTIVITAMIN OR) Once daily     . NIFEdipine ER (AFEDITAB CR) 30 MG Oral TABLET SR 24 HR Take 1 tablet (30 mg) by mouth daily. 90 tablet 4   . Pantoprazole Sodium 40 MG Oral Tab EC Take 1 tablet (40 mg) by mouth every morning. 90 tablet 3   . Simvastatin 40 MG Oral Tab Take 1 tablet (40 mg) by mouth every evening. For cholesterol 90 tablet 3   . Tamsulosin HCl 0.4 MG Oral Cap Take 1 capsule (0.4 mg) by mouth at bedtime. 90 capsule 3   . Tiotropium Bromide Monohydrate (SPIRIVA HANDIHALER) 18 MCG Inhalation Cap Inhale the contents of 1 capsule via Handihaler once daily. After breathing out, inhale a second time. 90 capsule 2   . Triamcinolone Acetonide 0.1 % External Ointment Apply 1 application topically 2 times a day. Apply to affected areas bid 80 g 1     No facility-administered medications prior to visit.        SOCIAL HISTORY:   Social History     Social History   . Marital Status: Married     Spouse Name: N/A   . Number of Children: 5   . Years of Education: N/A     Occupational History   . STAFF      Purchasing     Social History Main Topics   . Smoking status: Former Smoker -- 1.00 packs/day for 40 years     Types: Cigarettes     Quit date: 05/11/1998   . Smokeless tobacco: Never Used      Comment: quit three years ago   . Alcohol Use: No      Comment: quit 17 years ago   . Drug Use: No   . Sexual Activity:     Partners: Female      Comment: no protection     Other Topics Concern   . Not on file     Social History Narrative    From sept 2011 note from Dr. Claybon Jabs, cardiology:  He has been married 3 times, 25 years to his current wife.  He has 5 daughters living and well although two of his daughters are rather overweight. He is working as a Water quality scientist. He smoked one package of cigarettes a day from the age of 38 years until about 10 years ago, although for the last 5 years of smoking, he was down to about half a package of  cigarettes a day.  He is an admitted alcoholic and quit drinking in 1981, at which time he attended [place] meetings but not any more.        1.24.2012: Married to [employer]. 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 Plans on spending more time woodworking. Goes to gym--exercises 20-30 mins every other day. Ct    -- Considering working for [employer]        FAMILY HISTORY:   Family History   Problem Relation Age of Onset   . Cancer Father      Prostate   . Arthritis Father    . Other Father      Chronic Kidney Disease   . Psych Mother      Alzheimer's Disease   . Cancer Brother         PHYSICAL EXAM:   BP 136/64  mmHg  Pulse 72  Temp(Src) 97.4 F (36.3 C)  Ht 5\' 11"  (1.803 m)  Wt 218 lb (98.884 kg)  BMI 30.42 kg/m2  SpO2 90%   Voice: Rough, breathy, strained    General: alert, no distress, oxygen tank.  Psychiatric: Pleasant male. Mood and affect appropriate..  Skin: Skin color, texture, turgor normal. No rashes or concerning lesions.  Head: Normocephalic. No masses, lesions, tenderness or abnormalities.   Eyes: Lids/periorbital skin normal, Conjunctivae/corneas clear, PERRL, EOM's intact.  Ears: External ears normal. Canals clear. TM's normal..   Nose: normal  Oropharynx: Lips, mucosa, and tongue normal. Teeth and gums normal., posterior pharynx without erythema or drainage.  Tonsils: 0+.  Neck: supple. No adenopathy. Thyroid symmetric, normal size, without nodules.  Chest rise is symmetric.  Cranial Nerves: II-XII intact to examination.   Extremities: Normal, without deformities, edema, or skin discoloration.    PROCEDURE: Flexible fiberoptic nasal laryngoscopy with stroboscopy    Indication: Dysphonia    Operator: Alesia Banda K    Findings:   1. There are no nasopharyngeal, oropharyngeal or hypopharyngeal lesions or masses seen.   2. Vocal folds are mobile.   3. There are diffuse mucosal white lesions along the aryepiglottic folds and vocal folds  4. There are some postcricoid  edema and medial arytenoid erythema.   5. On strobe light source there is normal mucosal vibration.   6. Bilateral ventricular polyps present at the anterior commissure.   Description of Procedure: The procedure was explained to the patient who verbally agreed to proceed. Afrin and Lidocaine 4% were used topically for anaesthesia. The endoscope was passed atraumatically through the nose with the findings as described. The scope was gently withdrawn. The patient tolerated the procedure well.. The findings were reviewed with the patient and their questions were answered.      Assessment/Plan:   Mr. Roughton is a 80 year old man with bilateral ventricular polyps and fungal laryngitis. Flexible nasolaryngoscopy today demonstrated decreased size of ventricular polyps in addition to diffuse white mucosal lesions suggestive of fungal laryngitis.    We will prescribe a two-week course of Nystatin swish and swallow for his fungal infection. We discussed that this infection is likely related to his steroid inhaler use and recommended frequent oral rinsing after use.     We discussed the role of speech therapy in developing better breathing and speaking techniques. He is interested in pursuing therapy closer to his home in [place]; we recommended he work with Katherine Roan, a Electrical engineer in [place]. A referral was placed today.    Mr. Villada will contact the clinic if he would like to be seen again in the future.

## 2015-04-02 NOTE — Progress Notes (Signed)
SPEECH LANGUAGE PATHOLOGY/LARYNGOLOGY EVALUATION   AND INITIAL PLAN OF CARE     The following patient identifiers were confirmed today name and date of birth.    Time in: 1137  Duration: 25 min   Medicare Initial Certification From: 24/08/8097 Through: 04/02/2015   Onset Date: ~2014  Start of Care Date: 04/02/2015  Visits from Start of Care:  1  Referring Provider:  []  Dr. Freddi Starr,  []  Dr. Cristina Gong  [x]   Dr. 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 is a 80 year old male with moderate rough/breathy/strained hypophonia in the setting of B TVF atrophy, incomplete glottic closure, compensatory hyperfunction and anterior ventricular polyps. Pt did not attend voice therapy as suggested after last visit with Dr. Bjorn Loser on 10/16/2014 due to transportation challenges. Patient would likely benefit from voice therapy to increase efficiency of vocal production by coordinating respiration and phonation and decreasing vocal strain. Patient understands that voice may not be stronger/louder; however, goal is to become more consistent and less effortful. He may also benefit from amplification. He would be amenable to therapy closer to home.        PERTINENT MEDICAL/SURGICAL HISTORY:   Past Medical History   Diagnosis Date   . Aortic aneurysm of unspecified site without mention of rupture (HCC)      Aortic Aneurysm   . MALIG NEOPLASM DORSAL TONGUE(aka CANCER) 03/29/2002     Ssurgery 1994, T1N1 squamous cell ca L tongue, partial glossectomy; L supraorbital hyoid neck dissection    . Tobacco use disorder 12/24/2005     Quit 2000.   Marland Kitchen Unspecified hearing loss 12/24/2005     Bilat hearing aids.   . ECZEMA  12/24/2005     Lower extremities in particular.   Marland Kitchen ERECTILE DYSFUNCTION 12/24/2005   . Benign neoplasm of colon 12/24/2005     Tubular adenoma. Dx'ed flex sig 2000--> colonoscopy 2000: N.  F/U 2003: mild  diverticulosis, tubular adenoma.  F/U due 2008 with CT colonography.   . Other specified disorders resulting from impaired renal function 07/22/2005     Creat ~ 1.2   . Calculus of kidney 12/24/2005     Lithotripsy 1988   . 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 Thoracic aneurysm without mention of rupture ([place]) 12/24/2005     Ascending aortic aneurysm.  Followed by Dr. Ralene Cork, Cardiac surgery.  Gets regular CT scans.  Goal systolic BP: 833-825   . Abdominal aneurysm without mention of rupture ([place]) 12/24/2005     Borderline, seen on CT 8/06.   . Ventricular septal defect 12/24/2005     Small muscular VSD--NEEDS SBE PROPHYLAXIS. (Clindamycin 600 mg prior to dental visits.)   . Hypertrophy of prostate without urinary obstruction and other lower urinary tract symptoms (LUTS) 12/24/2005   . Generalized osteoarthrosis, unspecified site 01/07/2007     Has seen orthopedics--cortisone shot to L knee helped.   . Diverticulitis of colon (without mention of hemorrhage) 01/07/2007     Hospitalized at [facility] May 2008   . Unspecified disorder of muscle, ligament, and fascia    . Sleep apnea        Past Surgical History   Procedure Laterality Date   . Ventral hernia repair       x3   . Removal  of tongue carcinoma with lymphadenectomy     . Fusion l foot distal phalange     . Lithotrypsy of r renal calculi     . Retinaculum release b/l wrists     . Vasectomy w/tray       x2   . Vasectomy reversal     . Rpr 1st ingun hrna age 56 mo-5 yrs reducible     . Revj tot knee arthrp fem&entire tibial compone       left knee   . Ligation prq vas deferens uni/bi spx     . Brachy therapy     . Lipotripsy surgery     . Heart stent placement  09/2010, 02/2011     [place]   . Unlisted procedure spine  Mar 21 2012     L spine surgery        Prior Level of Function: Before the onset of the current condition, the patient was able to speak normally.    SUBJECTIVE:   Pt. was alert and ready to participate     Patient Statement: His voice has not improved since his last  visit to this clinic on 10/16/2014. He states that he did not follow through on recommendations for voice therapy because the travel/driving was too much.    Barriers to learning: none  Preferred Learning style:  demonstration, written and verbal  Cultural Practices that Influence Care: none    Pain Rating/Action Taken/Fall report:  See Patient Self Assessment of Pain and falls in medical record in OTO section from today.    Voice Usage: Intermittent    VHI-10 Score 20/40 EAT-10 Score 0/40   RSI Score 11/45     DI Score 16/40 MLCQ Score 16/40 SVHI Score na/40       OBJECTIVE:    Observations:   Resonance: WNL  Focus: posterior tone focus       Respiration: Breath holding pattern, Poor coordination of phonation with respiration and short breath groups    Articulation: WNL    Other: none   [x]  maximum phonation time 5 seconds     Perceptual Rating Of Vocal Quality:  CAPE-V  The following parameters of voice quality will be rated upon completion of the following tasks:  1. Sustained vowels 2. Sentence production 3. Spontaneous speech    Clinician's Perceptual Assessment using Consensus Auditory Perceptual Evaluation of Voice (CAPE-V)    Overall Severity  86/100  [x]  Consistent []  Intermittent  Roughness   88/100  []  Consistent [x]  Intermittent  Breathiness   84/100  []  Consistent [x]  Intermittent  Strain    89/100  []  Consistent [x]  Intermittent  Pitch    5/100  [x]  Consistent []  Intermittent       Nature of abnormality: elevated  Loudness   79/100  [x]  Consistent []  Intermittent       Nature of abnormality: limited dynamic range      Clinical Swallow Evaluation:  Oral Mechanism Examination: Not evaluated    Clinical Observations: Not tested    3 oz water swallow: not tested    Modified Functional Oral Intake Scale:  7 - Total oral intake with no restrictions              Fiberoptic Endoscopic Evaluation of Swallowing:  Patient was then seen with:  []  Dr. Freddi Starr,  []  Dr. Cristina Gong  []   Dr. Bjorn Loser  laryngologist, who  performed flexible endoscopy which revealed the following:     not performed  Videostroboscopic Findings:    Patient was then seen with:  []  Dr. Freddi Starr,  []  Dr. Cristina Gong  [x]   Dr. Bjorn Loser  laryngologist, who performed flexible videostroboscopy which revealed the following:    MUCOSA: No lesions and with some stiffness. mucosa lacks translucency. Bilateral ventricular polyps still present but reduced in size as compared with prior exam.    VOCAL FOLD MOVEMENT:  Right: normal  Left: normal     GLOTTIC CLOSURE: small mid cord gap    VIBRATION:   Periodicity: aperiodic  and periodic     Phase: asymmetrical  Mucosal Wave:  Right: present  Left: present   Amplitude:  Right: normal   Left: normal     MUSCLE TENSION PATTERN: Lateral (Type II): bilateral; severe and profound with ventricular contact and phonation    Supraglottis: WNL  Subglottis: Not visualized   Tremor: No  Breaks: No  Observations with Pitch Change: Vocal folds lengthen appropriately with increased pitch.  Observations with Changes in Loudness: not tested    Observations with Treatment Probes: Decreased strain with unloading     TREATMENT AND EDUCATION:    Education:  Plan/recommendations were reviewed with patient/caregiver.  The following were: Taught to patient/caregiver; voice    Contact information was provided for further questions.     Response: patient/caregiver verbalized understanding    Skilled Services Provided: Beh. & qualitative analysis of voice & resonance    ASSESSMENT:  See clinical impression above.     Rehabilitation potential is: Good  Potential Barriers to achieve rehab goals: none  These goals were discussed and the patient agrees with them.      DISCHARGE GOALS:  1. Maintain best quality voice for functional communication and participation in community activities:    1a. Evaluate status of vocal folds  1b. Patient will participate in education of basic anatomy and physiology of vocal fold vibration  1d. Patient will express  comprehension of good vocal hygiene     Goals - met    Patient/Family Input to Goals: Pt. wishes to: improve voice quality and know status of vocal folds    PLAN OF CARE:   Frequency/Duration of Treatment: Evaluation only    This patient will recheck with the referring provider per MD recs.  in 6 months.    I Will send material to outside SLP to facilitate referral and will call pt to inform him of this.  The plan outlined above was formulated, reviewed, and agreed upon with the patient and/or family/caregiver.  Patient/caregiver verbalized understanding and restated information in plan.  Aris Georgia, SLP    FUNCTIONAL REPORTING (Medicare only):  Reporting on Goal # 1; Assessed at level 3 today using NOMS.   Current evaluation: Voice: [id number] C L  Goal Status: Voice: G9172 C L                  Discharge Status: Voice: [id number] C L              Referring Physician/Practitioner Certification Statement:    I Certify the need for these services furnished under this plan of treatment and while under my care.

## 2015-04-02 NOTE — Addendum Note (Signed)
Addended by: Redmond Pulling on: 04/02/2015 05:52 PM     Modules accepted: Orders

## 2015-04-02 NOTE — Progress Notes (Signed)
I saw and evaluated the patient and agree with Dr.Noble's note.  I was present from insertion to removal of the scope.      Patient with multi-factorial hoarseness.  He states that although his voice is hoarse, he is able to communicate for the most part adequately, he just has some trouble eating understood over the phone.    His factors that contribute to his hoarseness include his poor pulmonary reserve, age-related vocal fold thickening, and the previously identified ventricular polyps.  We had previously scheduled him for an intervention but on his medical clearance evaluation they recommended that he only received procedures are absolutely necessary due to his poor cardiopulmonary reserve.  Although vocal fold injection augmentation can be performed in clinic, we know that this does cause some cardiopulmonary stress and thus he is not a good candidate for a clinic procedure.  We discussed that if his voice is giving him enough concerned that he would be willing to undertake some risk for the procedure we can discuss with his primary care physician whether or not performing the procedure in the OR under some mild sedation would be worthwhile for him for a temporary augmentation that would last somewhere between 3-6 months.  At this point Mr. Soriano is willing to see if he would benefit from speech therapy.  I we've written him a prescription for this.  He'll follow-up with me if he desires further intervention.

## 2015-04-03 ENCOUNTER — Telehealth (INDEPENDENT_AMBULATORY_CARE_PROVIDER_SITE_OTHER): Payer: Self-pay | Admitting: Family Medicine

## 2015-04-03 NOTE — Telephone Encounter (Signed)
Left messages with request to call RN for TCM follow up.

## 2015-04-03 NOTE — Telephone Encounter (Signed)
"  Breathing a bit better but still get winded pretty easily. Doing some walking on the treadmill.   I am haven't had anything to drink (Etoh) since I went into the hospital."  Patient endorses feeling much better since hospital d/c.  Patient states that he knows he is an alcoholic and needs to not be drinking.  States that he has no interest in doing inpatient alcohol rehabilitation and is doing two AA meetings a day.  Patient agrees to keep 04/09/15 OV with PCP and to call back from RN for TCM follow up during th week of 04/08/15.

## 2015-04-03 NOTE — Addendum Note (Signed)
Addended by: Roderick Pee A on: 04/03/2015 09:39 AM     Modules accepted: Orders

## 2015-04-09 ENCOUNTER — Ambulatory Visit (INDEPENDENT_AMBULATORY_CARE_PROVIDER_SITE_OTHER): Payer: No Typology Code available for payment source | Admitting: Family Medicine

## 2015-04-09 VITALS — BP 125/76 | HR 79 | Temp 97.5°F | Resp 20 | Wt 214.0 lb

## 2015-04-09 DIAGNOSIS — J431 Panlobular emphysema: Secondary | ICD-10-CM

## 2015-04-09 DIAGNOSIS — K56600 Partial intestinal obstruction, unspecified as to cause: Secondary | ICD-10-CM

## 2015-04-09 DIAGNOSIS — K5669 Other intestinal obstruction: Secondary | ICD-10-CM

## 2015-04-09 DIAGNOSIS — F1021 Alcohol dependence, in remission: Secondary | ICD-10-CM

## 2015-04-09 NOTE — Progress Notes (Signed)
Reason for Visit: ER follow up    Refills? NO  Referral? NO  Letter or Form? NO  Lab Results? NO    HEALTH MAINTENANCE:  Has the patient had this done since their last visit?  Cervical screening/PAP: N/A  Mammo: N/A  Colon Screen: due 2019    Have you seen a specialist since your last visit: Yes - Name, location, and date [facility]-received records    Vaccines Due? No    HM Due:   Health Maintenance   Topic Date Due   . Cholesterol Test  06/07/2017   . Colonoscopy  07/14/2017   . Tetanus Vaccine  05/20/2020   . Zoster Vaccine  Completed   . Pneumococcal Vaccine  Completed   . Influenza Vaccine  Completed       PCP Verified?  Yes, Tanner, Deitra Mayo, MD

## 2015-04-09 NOTE — Progress Notes (Signed)
S:  Patient is here to follow-up her recent hospitalization at [facility] morning--could not get a deep breath  Went to Urgent care but he says they could not talk to him since he was on medicare  He phoned his wife [name] who was in [facility] at the time--told him to go to ER  AA friend took him to [facility] ER--had CT scan and xrays and nebs--was admitted  Started vomitting the next day--brown bile  Had radiological investigations of abdo--had "blockage of intestines"--thought to be due to constipation, enemas worked, they also pumped his stomach  Was treated with antibiotics and IV fluids and nebs q4h -- it helped --was there for 6 days  Now breathing is back to how it was before  O2 is back to one liter--doing OK with one liter  Increases it on exertion    Now--doing OK  Saw Dr. Bjorn Loser for follow up on vocal cords--no change--nothing serious  Will try speech therapy--has appt at [facility] in [place] for that  Apparently has fungus on vocal cords--doing a treatment for 14 days    Re ETOH:  going to AA twice a day  Says that he has had Nothing to drink since the hospitalization  Feels that he has a different resolve now  Feels like he is drinking because he feels sorry for himself  Has a new resolve  Is getting more involved    Does have appt with DR Jari Favre on 12/22 to follow-up cardiac issues    In general doing well except sob    Patient Active Problem List   Diagnosis   . HYPERTENSION NOS(aka HTN)   . Pure hypercholesterolemia   . MALIG NEOPLASM DORSAL TONGUE(aka CANCER)   . Chronic Kidney Disease -- Stage III   . Tobacco use disorder   . Unspecified hearing loss   . ECZEMA    . ERECTILE DYSFUNCTION   . Benign neoplasm of colon   . Calculus of kidney   . Panlobular emphysema ([place])   . Thoracic aneurysm without mention of rupture   . AAA (abdominal aortic aneurysm) ([place])   . Ventricular septal defect   . Hypertrophy of prostate without urinary obstruction and other lower urinary tract symptoms (LUTS)   .  Generalized osteoarthrosis, unspecified site   . Diverticulitis of colon (without mention of hemorrhage)   . Routine general medical examination at a health care facility   . Lumbago   . Malignant neoplasm of prostate ([place])   . Coronary artery disease involving native coronary artery of native heart with angina pectoris ([place])   . Lumbar stenosis with neurogenic claudication   . Major depressive disorder with single episode, in full remission ([place])   . History of non-ST elevation myocardial infarction (NSTEMI)   . Alcohol use disorder, moderate, in sustained remission   . Carotid stenosis   . Falls   . Pneumothorax on right   . Right rib fracture   . Vocal fold atrophy   . ventricular polyps   . Dysphonia     Outpatient Prescriptions Prior to Visit   Medication Sig Dispense Refill   . Acamprosate Calcium 333 MG Oral Tab EC TAKE 1 TABLET THREE TIMES A DAY 270 tablet 0   . Albuterol Sulfate HFA 108 (90 BASE) MCG/ACT Inhalation Aero Soln Inhale 2 puffs by mouth every 4 hours as needed for shortness of breath/wheezing. For wheezing and/or cough. 1 Inhaler 12   . Aspirin 81 MG Oral Tab 1 tablet  by mouth once daily     . Budesonide-Formoterol Fumarate 160-4.5 MCG/ACT Inhalation Aerosol Inhale 2 puffs by mouth every 12 hours. 3 Inhaler 4   . Cyanocobalamin (B-12) 100 MCG Oral Tab Take 100 mcg by mouth.     . Furosemide 20 MG Oral Tab Take 1 tablet (20 mg) by mouth daily. 90 tablet 3   . Metoprolol Succinate ER 100 MG Oral TABLET SR 24 HR Take 1 tablet (100 mg) by mouth daily. (Dose per discharge summary on 01/20/13) 90 tablet 3   . Multiple Vitamins-Minerals (MULTIVITAMIN OR) Once daily     . NIFEdipine ER (AFEDITAB CR) 30 MG Oral TABLET SR 24 HR Take 1 tablet (30 mg) by mouth daily. 90 tablet 4   . Nystatin 100000 UNIT/ML Mouth/Throat Suspension Swish and Swallow 5 mL 3 times a day for 14 days. Swish 1/2 dose in each side of mouth for as long as possible before swallowing. 473 mL 0   . Pantoprazole Sodium 40 MG Oral Tab EC  Take 1 tablet (40 mg) by mouth every morning. 90 tablet 3   . Simvastatin 40 MG Oral Tab Take 1 tablet (40 mg) by mouth every evening. For cholesterol 90 tablet 3   . Tamsulosin HCl 0.4 MG Oral Cap Take 1 capsule (0.4 mg) by mouth at bedtime. 90 capsule 3   . Tiotropium Bromide Monohydrate (SPIRIVA HANDIHALER) 18 MCG Inhalation Cap Inhale the contents of 1 capsule via Handihaler once daily. After breathing out, inhale a second time. 90 capsule 2   . Triamcinolone Acetonide 0.1 % External Ointment Apply 1 application topically 2 times a day. Apply to affected areas bid 80 g 1     No facility-administered medications prior to visit.     O:  Pt alert and cooperative and in NAD. Has portable O2   BP 125/76 mmHg  Pulse 79  Temp(Src) 97.5 F (36.4 C) (Temporal)  Resp 20  Wt 214 lb (97.07 kg)  SpO2 90%  Chest: Increased air entry bilaterally no crackles or wheezes  CV: Patient noted to have a loud S1, mild peripheral edema    A/P:  (K56.69) Partial small bowel obstruction (HCC)  (primary encounter diagnosis)  Diagnosed with the following conditions when he was hospitalized at [facility].  See in media tab dated 03/26/2015 for details  Acute on chronic respiratory failure  COPD exacerbation  Demand ischemia obstipation  Severe sepsis secondary to SBO  Acute kidney injury, resolved  Alcohol abuse    Plan: He is slowly getting better.  Continue on home O2.  Continue to work on combating the alcohol issue  Ensure adequate fluid intake    (J43.1) Panlobular emphysema (HCC)  Plan: Continue on current resp meds    (F10.21) Alcohol use disorder, moderate, in sustained remission  Plan: I am hopeful that remission will persist in the long-term    rtc one month for follow up.    Theresia Bough, MD

## 2015-04-09 NOTE — Patient Instructions (Signed)
Keep up the good work

## 2015-04-10 ENCOUNTER — Telehealth (INDEPENDENT_AMBULATORY_CARE_PROVIDER_SITE_OTHER): Payer: Self-pay | Admitting: Family Medicine

## 2015-04-10 NOTE — Telephone Encounter (Signed)
Patient states that he is feeling well and continues to be sober with good outlook.  No breathing difficulty.  Patient agrees to call back during the week of 04/15/15.

## 2015-04-16 ENCOUNTER — Encounter (HOSPITAL_BASED_OUTPATIENT_CLINIC_OR_DEPARTMENT_OTHER): Payer: Self-pay | Admitting: Cardiovascular Disease

## 2015-04-16 ENCOUNTER — Other Ambulatory Visit (HOSPITAL_BASED_OUTPATIENT_CLINIC_OR_DEPARTMENT_OTHER): Payer: Self-pay | Admitting: Cardiovascular Disease

## 2015-04-16 ENCOUNTER — Ambulatory Visit
Payer: No Typology Code available for payment source | Attending: Cardiovascular Disease | Admitting: Cardiovascular Disease

## 2015-04-16 VITALS — BP 122/76 | HR 80 | Ht 71.0 in | Wt 211.0 lb

## 2015-04-16 DIAGNOSIS — E7800 Pure hypercholesterolemia, unspecified: Secondary | ICD-10-CM | POA: Insufficient documentation

## 2015-04-16 DIAGNOSIS — E785 Hyperlipidemia, unspecified: Secondary | ICD-10-CM | POA: Insufficient documentation

## 2015-04-16 DIAGNOSIS — I25119 Atherosclerotic heart disease of native coronary artery with unspecified angina pectoris: Secondary | ICD-10-CM | POA: Insufficient documentation

## 2015-04-16 DIAGNOSIS — I712 Thoracic aortic aneurysm, without rupture, unspecified: Secondary | ICD-10-CM

## 2015-04-16 DIAGNOSIS — I10 Essential (primary) hypertension: Secondary | ICD-10-CM | POA: Insufficient documentation

## 2015-04-16 LAB — LIPID PANEL
Cholesterol (LDL): 112 mg/dL (ref <130)
Cholesterol/HDL Ratio: 2.7
HDL Cholesterol: 78 mg/dL (ref >39)
Non-HDL Cholesterol: 129 mg/dL (ref 0–159)
Total Cholesterol: 207 mg/dL — ABNORMAL HIGH (ref <200)
Triglyceride: 83 mg/dL (ref <150)

## 2015-04-16 MED ORDER — ATORVASTATIN CALCIUM 40 MG OR TABS
40.0000 mg | ORAL_TABLET | Freq: Every day | ORAL | Status: DC
Start: 2015-04-16 — End: 2015-09-18

## 2015-04-17 ENCOUNTER — Telehealth (INDEPENDENT_AMBULATORY_CARE_PROVIDER_SITE_OTHER): Payer: Self-pay | Admitting: Family Medicine

## 2015-04-17 NOTE — Progress Notes (Signed)
ELISIAH, SCHETTER          [id number]          04/16/2015      CARDIOLOGY CLINIC NOTE     Mr. Piker on November 24 was admitted to [facility] because of increasing shortness of breath.  He actually contacted the urgent care unit here, but was told because he was on Medicare he could not be seen, which seems somewhat puzzling to me.  In any event, he apparently did have an intestinal obstruction, mainly because he had not had any normal bowel movements.  He actually vomited a couple of times.  He finally did have bowel movements and overall felt better.  His breathing improved and since discharge from [facility] has been doing reasonably well.  He now gets short of breath walking up to 1-2 blocks.  He is on continuous oxygen 2 L, although at home off and on he cuts it down to 1 or 1-1/2 L per minute.  He has no orthopnea or paroxysmal nocturnal dyspnea and has had absolutely no chest pain.  His bowel movements now are fine on MiraLax.    PHYSICAL EXAMINATION     VITAL SIGNS:  Blood pressure 122/76 sitting, 116/72 standing, pulse 80 per minute, weight 211 pounds, which is down 1 pound since 3 months ago.  No increased systemic venous pressure.  NECK:  Grade 1 systolic carotid bruits bilaterally.  LUNGS:  Lungs are entirely clear.  HEART:  Regular rhythm.  Grade 2 aortic systolic ejection murmur.  No gallops, heaves or percussive cardiomegaly.  ABDOMEN:  Obese.  Neither liver nor spleen are felt.  EXTREMITIES:  No peripheral edema.  Good pedal pulses.    ASSESSMENT AND PLAN     He did have a lipid panel today and his calculated LDL cholesterol is 112 mg %.  His HDL cholesterol is fine.  He is currently on simvastatin 40 mg a day, and I am going to switch him to atorvastatin 40 mg a day. I like to see his LDL at least under 100 mg%.   He is to continue on the other medications as outlined.

## 2015-04-17 NOTE — Telephone Encounter (Signed)
Patient states that he continues to feel well and continues to be successful in his sobriety.  He is attending two [place] meetings daily and is motivated by last hospital stay to remain sober.  Denies need for additional clinical support today.   Patient agrees to call back from RN during the week of 04/22/15.

## 2015-04-24 ENCOUNTER — Telehealth: Payer: Self-pay | Admitting: Family Medicine

## 2015-04-24 NOTE — Telephone Encounter (Signed)
Attempted to contact pt for final TCM outreach per protocol but pt did not answer. Left message with request to call clinic with any worsening symptoms or updates, as needed. Closing encounter. Ending TCM from [facility] 02/2015 admission.

## 2015-05-07 ENCOUNTER — Encounter (HOSPITAL_BASED_OUTPATIENT_CLINIC_OR_DEPARTMENT_OTHER): Payer: Self-pay | Admitting: Otolaryngology

## 2015-05-08 ENCOUNTER — Ambulatory Visit (INDEPENDENT_AMBULATORY_CARE_PROVIDER_SITE_OTHER): Payer: No Typology Code available for payment source | Admitting: Family Medicine

## 2015-05-10 ENCOUNTER — Telehealth (HOSPITAL_BASED_OUTPATIENT_CLINIC_OR_DEPARTMENT_OTHER): Payer: Self-pay | Admitting: Otolaryngology

## 2015-05-10 DIAGNOSIS — R490 Dysphonia: Secondary | ICD-10-CM

## 2015-05-10 NOTE — Telephone Encounter (Signed)
Patient requests additional referral to speech therapy - will send to patient

## 2015-05-31 ENCOUNTER — Ambulatory Visit (INDEPENDENT_AMBULATORY_CARE_PROVIDER_SITE_OTHER): Payer: No Typology Code available for payment source | Admitting: Family Medicine

## 2015-05-31 ENCOUNTER — Encounter (INDEPENDENT_AMBULATORY_CARE_PROVIDER_SITE_OTHER): Payer: Self-pay | Admitting: Family Medicine

## 2015-05-31 VITALS — BP 132/78 | HR 73 | Temp 98.7°F | Resp 20 | Wt 215.0 lb

## 2015-05-31 DIAGNOSIS — C61 Malignant neoplasm of prostate: Secondary | ICD-10-CM

## 2015-05-31 DIAGNOSIS — I712 Thoracic aortic aneurysm, without rupture, unspecified: Secondary | ICD-10-CM

## 2015-05-31 DIAGNOSIS — I714 Abdominal aortic aneurysm, without rupture, unspecified: Secondary | ICD-10-CM

## 2015-05-31 DIAGNOSIS — Z0001 Encounter for general adult medical examination with abnormal findings: Secondary | ICD-10-CM

## 2015-05-31 DIAGNOSIS — Z0000 Encounter for general adult medical examination without abnormal findings: Secondary | ICD-10-CM

## 2015-05-31 DIAGNOSIS — J431 Panlobular emphysema: Secondary | ICD-10-CM

## 2015-05-31 DIAGNOSIS — I25119 Atherosclerotic heart disease of native coronary artery with unspecified angina pectoris: Secondary | ICD-10-CM

## 2015-05-31 DIAGNOSIS — N2589 Other disorders resulting from impaired renal tubular function: Secondary | ICD-10-CM

## 2015-05-31 LAB — COMPREHENSIVE METABOLIC PANEL
ALT (GPT): 14 U/L (ref 10–48)
AST (GOT): 21 U/L (ref 9–38)
Albumin: 4.1 g/dL (ref 3.5–5.2)
Alkaline Phosphatase (Total): 77 U/L (ref 52–227)
Anion Gap: 12 (ref 4–12)
Bilirubin (Total): 0.7 mg/dL (ref 0.2–1.3)
Calcium: 9.8 mg/dL (ref 8.9–10.2)
Carbon Dioxide, Total: 28 meq/L (ref 22–32)
Chloride: 99 meq/L (ref 98–108)
Creatinine: 1.67 mg/dL — ABNORMAL HIGH (ref 0.51–1.18)
GFR, Calc, African American: 48 mL/min/{1.73_m2}
GFR, Calc, European American: 40 mL/min/{1.73_m2}
Glucose: 123 mg/dL (ref 62–125)
Potassium: 4.5 meq/L (ref 3.6–5.2)
Protein (Total): 6.8 g/dL (ref 6.0–8.2)
Sodium: 139 meq/L (ref 135–145)
Urea Nitrogen: 25 mg/dL — ABNORMAL HIGH (ref 8–21)

## 2015-05-31 LAB — PR A1C RAPID, ONSITE: Hemoglobin A1C: 6 % (ref 4.0–6.0)

## 2015-05-31 LAB — CBC (HEMOGRAM)
Hematocrit: 39 % (ref 38–50)
Hemoglobin: 12.9 g/dL — ABNORMAL LOW (ref 13.0–18.0)
MCH: 31.2 pg (ref 27.3–33.6)
MCHC: 33 g/dL (ref 32.2–36.5)
MCV: 95 fL (ref 81–98)
Platelet Count: 211 10*3/uL (ref 150–400)
RBC: 4.13 10*6/uL — ABNORMAL LOW (ref 4.40–5.60)
RDW-CV: 15.6 % — ABNORMAL HIGH (ref 11.6–14.4)
WBC: 6.47 10*3/uL (ref 4.3–10.0)

## 2015-05-31 NOTE — Progress Notes (Signed)
S:  Patient is here for a Wellness visit   Otherwise doing well  Still has SOB--about the same since thanksgiving when he was admitted to hospital    Will be seeing pulmonary --Dr. Jari Pigg at the end of the month  Using spiriva (tiotropium) and symbicort (budesonide and formoterol)    Saw Dr Jari Favre, cardiology--put him atorvastatin and changed diuretic    Did see prostate doc, Dr. Denny Levy ca is in remission    Did see dentist--has vsd but also has knee replacement for which he was taking proph antibiotics but did not take antibiotics this last time    ROS:  Can breathe through nose--has O2 via nasal prongs  Using O2 pretty much 24/7  No CP no passing out no dizziness  Not lightheaded since stopping one of the BP meds  For BP: metoprolol nifedipine furosemide--seems to be working for him  No HB no reflux --on pantoprazole for that  No N no V  App so so--not eating as much as he used to, weight stable  BMs reg no C no D (using miralax)  Urinary Stream a bit slow--nocturia once a night about 4 am  No sleep apnea  Skin and bones and joints ok    Habits:  Not smoking  etoh very little. Still goes to AA  Diet good--not that good with fruit but likes veggies    Has history of ascending thoracic and abdominal aortic aneurysms  had chest and abdo CT at [facility] in Nov 2016  May be due for repeat Ct scans for aneurysms in 2018--    SUBJECTIVE:    Tracy Conway is a 81 year old male here for a wellness/preventive health care visit.     Additional concerns: see above    Current dietary habits: healthy diet in general  Current exercise habits: Advised him to be as active as his health conditions permit  Regular seat belt use: YES  Guns in the house: Not asked today    Functional assessment:  Activities of Daily Living (ADLs): Is independent in the following: all [place] will? Did not ask today    History sections of chart reviewed and updated today: yes    Patient Active Problem List    Diagnosis Date Noted   . [place] [phone]] 10/26/2013     Inpatient evaluation September 2014, felt to have micturition syncope.  7/15 -- orthostatic hypotension sxs, BPs in afternoon in mid to high 90s to low [id number] systolic, 123456 to 123456 diastolic. D/Ced Nifedipine.     . Coronary artery disease involving native coronary artery of native heart with angina pectoris ([place]) [I25.119] 12/30/2010     Non-ST elevation myocardial infarction., July 18,2012  LAD drug-eluting stent nov, 2012. Clopidogrel  See [name], cardiology       . Malignant neoplasm of prostate ([place]) [C61] 08/14/2010     Diagnosed 07/2010. Sees [name] (urol) and [name] (rad onc). Saw [name], [name]--[facility] [place]--palladium seed implants June 2012  Mr. Butsch has an intermediate risk prostate cancer on the basis of a Gleason 7 histology       . Lumbago [M54.5] 12/19/2009     March 21, 2012, underwent L4-L5 bilateral laminectomies and partial medial facetectomies, as well as an L4-L5 right-sided synovial cyst resection. He says since surgery he feels better       . Routine general medical examination at a health care facility [Z00.00] 05/15/2009  06/07/2012  Td/Tdap (q 10 yr):  2004, 2012  Pneumococcal (65 yoa and high risk x1):  2010, 2004  Influenza vaccine (q 1 yr):  2010 nov, 2011, 2012, 2013  Shingles: discussed--should get at his local pharmacy  Blood pressure:  Excellent 138/64  121/72  Cholesterol (q 5 yr begin age 72):on simvastatin oct 2012: excellent--will repeat  AAA screen (x1, male age 41, if ever smoked):  Checked at [facility]-- abdo and thoracic CT--aug 2012: stable ascending aortic aneurysm  Colorectal cancer: ct colonoscopy done 2008, repeat due 2013--done April 2013: neg   PSA:  Sees urology for fu prostate ca. Has had palladium seed implants  Tobacco:  NS x 11 yrs, 50 pack yr hx  Exercise:  Reg--treadmill x 20-30 mins then weight machines--every other day! Has not been back to the gym because of the  back pain  Alcohol /Drugs:  None in 26 yrs--relapse jan 2014--did inpatient treatment--now none in 37 days!  Sexual practice/contraception:  Not an issue  Advanced directives:  Does have living will. DPOA  done       . Generalized osteoarthrosis, unspecified site [M15.9] 01/07/2007     Has seen orthopedics--cortisone shot to L knee helped.  L knee replacement 2008     . Diverticulitis of colon (without mention of hemorrhage) [K57.32] 01/07/2007     Hospitalized at [facility] May 2008     . Tobacco use disorder [F17.200] 12/24/2005     Quit 2000.  50 pack year history  EtOH--quit 1986.     Marland Kitchen Unspecified hearing loss [H91.90] 12/24/2005     Bilat hearing aids.     . Benign neoplasm of colon [D12.6] 12/24/2005     Tubular adenoma. Dx'ed flex sig 2000--> colonoscopy 2000: N.  F/U 2003: mild diverticulosis, tubular adenoma.  F/U due 2008 with CT colonography.--done 7/08: ess. N.  Next due 2013       . Panlobular emphysema ([place]) [J43.1] 12/24/2005     05/2013: started by pulm on home O2  Emphysema: an FEV1 which is approximately at 33% of predicted    04/2010--Pulm: Chronic obstructive pulmonary disease, severe to very severe. I explained to him that continuing on the Spiriva as well as the Serevent was likely good therapy.I further told him that if he should have recurrent exacerbations within the course of the year that I would consider adding an inhaled corticosteroid at that point in time. He does not need oxygen at this point in time. He remains a nonsmoker. He is already engaged in a pulmonary rehabilitation.    Today I described to him how overall lung volume reduction surgery had no significant benefit on mortality. Subgroup analysis did indicate that those patients, who had upper lobe predominant disease with low exercise capacity were the most likely to benefit. Given the fact that he actually still has reasonably good functional status and is getting around and going to the gym and working out, it is not clear that he  would be in the group that would most likely benefit, although, to safe for certain, we need to get a cardiopulmonary exercise test. At this point in time, he is feeling good with his chronic obstructive pulmonary disease therapy and is not interested in pursuing lung volume reduction surgery.    05/20/2010 CT angio: Addendum: Increased scarring in the right lung base since the prior   exam including the interval development of a new nodule. The   appearance suggests that the patient has had a recent infection  of   the right lower lobe has resolved and since the prior scan. However   recommend 3 months followup of the right lower lobe lesion given that   the patient is high risk with extensive paraseptal emphysema.     08/03/11 CT Chest  1. New indeterminate nodules in the lower lobes bilaterally, the largest   nodule measures 10 x 12 mm in the paraspinal location in the right lower lobe.  Possible considerations include infectious/inflammatory and less likely   metastasis.   2. Bilateral upper lobe predominant moderate centrilobular and paraseptal   emphysema.   3. Unchanged dilated main pulmonary artery measuring 32 mm may indicate   pulmonary arterial hypertension.   4. Aortic aneurysm involving the ascending aorta at the level of the main   pulmonary artery measuring 47 mm.     10/2011:   Impression:  1. Previous opacities resolved. New focus of small airways inflammation in the  RUL. No worrisome nodule or opacity for malignancy. If followup of the new   airways inflammation is desired a 12 month study will be adequate.  2. Stable fibrosis and peripheral calcification in the RLL, which raises the   possibility of chronic aspiration.                  . Thoracic aneurysm without mention of rupture [I71.2] 12/24/2005     Ascending aortic aneurysm.  Followed by Dr. Ralene Cork, Cardiac surgery.  Gets regular CT scans.  Goal systolic BP: [id number]  Also seeing [name] cardiology  Also has AAA     . AAA (abdominal aortic  aneurysm) ([place]) [I71.4] 12/24/2005     Father also had AAA. Borderline, seen on CT 8/06.  Also has thoracic aneurysm     . Ventricular septal defect [Q21.0] 12/24/2005     Small muscular VSD--NEEDS SBE PROPHYLAXIS. (Clindamycin 600 mg prior to dental visits.)     . Hypertrophy of prostate without urinary obstruction and other lower urinary tract symptoms (LUTS) [N40.0] 12/24/2005   . Chronic Kidney Disease -- Stage III [N25.89] 07/22/2005     Creat ~ 1.8. Normal albuminuria (8/08)  Likely 2/2 HTN, poss component from hx obstructing nephrolithiasis.  8/08 Renal: referred to Uro for 44mm stone, nonobstructing. Also doing w/u for stone type, poss target prevention.     Marland Kitchen MALIG NEOPLASM DORSAL TONGUE(aka CANCER) [C02.0] 03/29/2002     [place], T1N1 squamous cell ca L tongue, partial glossectomy; L supraorbital hyoid neck dissection       . HYPERTENSION NOS(aka HTN) [I10] 03/29/2002   . ECZEMA  [L25.9] 12/24/2005     Lower extremities in particular.     . Pure hypercholesterolemia [E78.00] 03/29/2002   . 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 ventricular polyps [J38.7] 10/16/2014   . Dysphonia [R49.0] 10/16/2014   . Pneumothorax on right [J93.9] 01/22/2014   . Right rib fracture [S22.31XA] 01/22/2014   . History of non-ST elevation myocardial infarction (NSTEMI) [I25.2]      [place]       . Alcohol use disorder, moderate, in sustained remission [F10.21]      [place]       . Carotid stenosis [I65.29]      [place]       . Major depressive disorder with single episode, in full remission ([place]) [F32.5] 11/10/2012   . Lumbar stenosis with neurogenic claudication [M48.06] 02/05/2012     March 21, 2012, underwent L4-L5 bilateral laminectomies and  partial medial facetectomies, as well as an L4-L5 right-sided synovial cyst resection. He says since surgery he feels better       . ERECTILE DYSFUNCTION [N52.9] 12/24/2005   . Calculus of kidney [N20.0] 12/24/2005     Lithotripsy 1988  Stone panel in 0ct 09:  high oxalate. Given low oxalae diet by Dr. Princella [name], nephrology in 2009.           Current Outpatient Prescriptions   Medication Sig Dispense Refill   . Acamprosate Calcium 333 MG Oral Tab EC TAKE 1 TABLET THREE TIMES A DAY 270 tablet 0   . Albuterol Sulfate HFA 108 (90 BASE) MCG/ACT Inhalation Aero Soln Inhale 2 puffs by mouth every 4 hours as needed for shortness of breath/wheezing. For wheezing and/or cough. 1 Inhaler 12   . Aspirin 81 MG Oral Tab 1 tablet by mouth once daily     . Atorvastatin Calcium 40 MG Oral Tab Take 1 tablet (40 mg) by mouth daily. 90 tablet 3   . Budesonide-Formoterol Fumarate 160-4.5 MCG/ACT Inhalation Aerosol Inhale 2 puffs by mouth every 12 hours. 3 Inhaler 4   . Cyanocobalamin (B-12) 100 MCG Oral Tab Take 100 mcg by mouth.     . Furosemide 20 MG Oral Tab Take 1 tablet (20 mg) by mouth daily. 90 tablet 3   . Metoprolol Succinate ER 100 MG Oral TABLET SR 24 HR Take 1 tablet (100 mg) by mouth daily. (Dose per discharge summary on 01/20/13) 90 tablet 3   . Multiple Vitamins-Minerals (MULTIVITAMIN OR) Once daily     . NIFEdipine ER (AFEDITAB CR) 30 MG Oral TABLET SR 24 HR Take 1 tablet (30 mg) by mouth daily. 90 tablet 4   . Nystatin 100000 UNIT/ML Mouth/Throat Suspension Swish and Swallow 5 mL 3 times a day for 14 days. Swish 1/2 dose in each side of mouth for as long as possible before swallowing. 473 mL 0   . Pantoprazole Sodium 40 MG Oral Tab EC Take 1 tablet (40 mg) by mouth every morning. 90 tablet 3   . Tamsulosin HCl 0.4 MG Oral Cap Take 1 capsule (0.4 mg) by mouth at bedtime. 90 capsule 3   . Tiotropium Bromide Monohydrate (SPIRIVA HANDIHALER) 18 MCG Inhalation Cap Inhale the contents of 1 capsule via Handihaler once daily. After breathing out, inhale a second time. 90 capsule 2   . Triamcinolone Acetonide 0.1 % External Ointment Apply 1 application topically 2 times a day. Apply to affected areas bid 80 g 1     No current facility-administered medications for this visit.           PHYSICAL EXAM:  BP 132/78 mmHg  Pulse 73  Temp(Src) 98.7 F (37.1 C) (Temporal)  Resp 20  Wt 215 lb (97.523 kg)  SpO2 96%   General: no acute distress.  Wearing O2 via nasal cannula  Skin: Skin color, texture, turgor normal. No rashes or concerning lesions, brief exam  Head: Normocephalic. No masses, lesions, tenderness or abnormalities  Eyes: exam deferred  Ears: hearing aids bilaterally  Nose: normal  Neck: supple. No adenopathy. Thyroid symmetric, normal size, without nodules  Lungs: clear to auscultation and very decreased air entry bilaterally but no crackles or wheezes were heard  Heart: normal rate, regular rhythm and very soft heart sounds, possibly loud S1  Abd: soft, non-tender. BS normal. No masses or organomegaly, protuberant abdomen  GU: deferred  Rectal: deferred  Prostate: deferred  Spine: Back symmetric, no deformity  Ext: Normal,  without deformities, edema, or skin discoloration, radial and DP pulses 2+ bilaterally  Neuro:  Grossly normal to observation, gait normal  -----------------------------------------    ASSESSMENT AND PLAN:  Health Maintenance   Topic Date Due   . Colonoscopy  07/14/2017   . Cholesterol Test  04/15/2020   . Tetanus Vaccine  05/20/2020   . Zoster Vaccine  Completed   . Pneumococcal Vaccine  Completed   . Influenza Vaccine  Completed     Immunizations or studies due: None    (N25.89) Chronic Kidney Disease -- Stage III  (primary encounter diagnosis)  Plan: CBC (HEMOGRAM), COMPREHENSIVE METABOLIC PANEL,         123XX123 RAPID, ONSITE            (J43.1) Panlobular emphysema ([facility])  Plan: Will be seeing Dr. Jari Pigg soon  Continue on current inhalers and supplemental oxygen    (I71.2) Thoracic aneurysm without mention of rupture  Plan: We reviewed his recent CT scans and his thoracic and abdominal aortic aneurysms appear to be stable over the years.  His last CT scan was at [facility] in November 2016.  Thus I think that follow-up CT to check for aneurysmal size  should be probably in 2018    (I71.4) Abdominal aortic aneurysm (AAA) without rupture ([place])  Plan: See above    (Z00.00) Routine general medical examination at a health care facility  Plan: Advised healthy lifestyle, written materials given to patient    (C61) Malignant neoplasm of prostate [facility])  Plan: He recently saw his surgeon and is in remission which he is very happy about    (I25.119) Coronary artery disease involving native coronary artery of native heart with angina pectoris ([place])  Plan: This appears to be stable and he is currently asymptomatic       Preventive counseling: importance of regular dental care, moderation in EtOH, healthy dietary guidelines, proper exercise    Theresia Bough, MD

## 2015-05-31 NOTE — Progress Notes (Signed)
Reason for Visit: wellness    Refills? YES  Referral? NO  Letter or Form? NO  Lab Results? NO    HEALTH MAINTENANCE:  Has the patient had this done since their last visit?  Cervical screening/PAP: N/A  Mammo: N/A  Colon Screen: due 06/2017    Have you seen a specialist since your last visit: cardiology    Vaccines Due? No    HM Due:   Health Maintenance   Topic Date Due   . Colonoscopy  07/14/2017   . Cholesterol Test  04/15/2020   . Tetanus Vaccine  05/20/2020   . Zoster Vaccine  Completed   . Pneumococcal Vaccine  Completed   . Influenza Vaccine  Completed       PCP Verified?  Yes,   Dr. Satira Sark

## 2015-05-31 NOTE — Patient Instructions (Signed)
Leading a Healthy Life  Six tips to help improve your health and wellness     This explains how these 6 basic guidelines may improve your health and wellness:   . Eat well to give your body the energy it needs.   . Stay or get active.   . A healthy mind is part of a healthy body.   . Practice safe living habits.   . Keep your mind and body free of harmful drugs and alcohol.   . Get regular health care.     Tip #1: Eat well to give your body the energy it needs.   Your body needs nutritious foods to stay strong and healthy.   Here are some general eating guidelines:   . Have 2 servings of fish or other seafood 2 times a week (1 serving = 4 ounces).   . If you eat dairy products, choose low-fat (1%) or nonfat ones.   . If you eat meat, cut down on the amount. Replace it with plant-based foods such as beans, whole grains, fruits and vegetables, and nuts and seeds.   . Have less than 1,500 mg of sodium (salt) a day.   . Cut down on "junk food" like alcohol, fatty foods, chips, candy, and other sweets.     Tip #2: Stay or get active.   Exercise for at least 30 minutes at a time, 3 times a week. Regular physical activity can help you:   . Live longer and feel better   . Be stronger and more flexible   . Build strong bones   . Prevent depression   . Strengthen your immune system   . Maintain a healthy body weight     Tip #3: Remember: A healthy mind is part of a healthy body.   A good state of mind can help you make healthy choices. Here are a few tips for keeping your mind healthy:   . Reduce stress in your life.   . Make some time every day for things that are fun.   . Get enough sleep. Lack of sleep reduces how well you can concentrate, increases mood swings, and raises your risk of having a car accident.   . Ask your health care provider for help if you feel depressed or anxious for more than several days in a row.     Tip #4: Practice safe living habits.   Accidents and Injuries      . Accidents and injuries are the  5th leading cause of death in the [place]   . Accidents in the home cause thousands of permanent injuries every year.     The most common accidents are fires, falls, and drowning. To help yourself and your family stay safe:   . Install smoke detectors on each floor of your home.   . Make sure everyone in your family knows how to swim  . Stay safe on the road:  o Wear a seatbelt.   o Do not ride with someone who has been drinking or taking drugs.   o Do not speak on a cell phone or send, read, or write text messages while you are driving.   o Wear a helmet when you ride a bicycle or motorcycle.   o Get enough sleep at night, and do not drive when you are tired.     Hand Hygiene   Protect yourself from germs by washing your hands often. Always wash your hands:   .   After you change a diaper or use the toilet   . Before you start and after you finish preparing food     Tip #5: Keep your mind and body free of harmful drugs and alcohol.   Tobacco causes more health problems than any other substance. These problems include lung disease, heart disease, and many types of cancer. The nicotine in tobacco is the most addictive and widely used drug.   Too much alcohol can cause damage to your liver, heart, brain, bones, and other body tissues. Being under the influence of alcohol also increases your chance of being injured in an accident.    Alcohol can cause fetal alcohol syndrome in your children if you drink regularly when you are pregnant.   Street drugs, like marijuana, cocaine, methamphetamine, heroin, or pain pills not prescribed by your doctor can harm your health. They may be mixed with harmful substances, and using them can cause people to put themselves in dangerous situations.     Tip #6: Get regular health care.   Many people think they need to see the doctor only when they are sick. But, health care providers can also help you stay healthy.   . Find a health care provider who works with you to manage your health.    . Ask your health care provider what diseases you are at risk for. Learn what you can do to prevent or control them.   . Get yourself and your family immunized against life-threatening diseases.

## 2015-06-21 ENCOUNTER — Ambulatory Visit: Payer: No Typology Code available for payment source | Attending: Pulmonary Disease | Admitting: Pulmonary Disease

## 2015-06-21 VITALS — BP 140/82 | HR 72 | Temp 98.4°F | Ht 71.0 in | Wt 215.0 lb

## 2015-06-21 DIAGNOSIS — J431 Panlobular emphysema: Secondary | ICD-10-CM

## 2015-06-21 NOTE — Progress Notes (Signed)
ID: Tracy Conway is here for follow-up of respiratory issues related to COPD    Billing Data: I spent a total of 40 minutes face to face over half of which was in direct counseling.     CC:   Chief Complaint   Patient presents with   . COPD     follow up       PL:   Patient Active Problem List    Diagnosis Date Noted   . 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 ventricular polyps [J38.7] 10/16/2014   . Dysphonia [R49.0] 10/16/2014   . Pneumothorax on right [J93.9] 01/22/2014   . Right rib fracture [S22.31XA] 01/22/2014   . Falls [phone]] 10/26/2013     Inpatient evaluation September 2014, felt to have micturition syncope.  7/15 -- orthostatic hypotension sxs, BPs in afternoon in mid to high 90s to low [id number] systolic, 123456 to 123456 diastolic. D/Ced Nifedipine.     Marland Kitchen History of non-ST elevation myocardial infarction (NSTEMI) [I25.2]      [place]       . Alcohol use disorder, moderate, in sustained remission [F10.21]      [place]       . Carotid stenosis [I65.29]      [place]       . Major depressive disorder with single episode, in full remission ([place]) [F32.5] 11/10/2012   . Lumbar stenosis with neurogenic claudication [M48.06] 02/05/2012     March 21, 2012, underwent L4-L5 bilateral laminectomies and partial medial facetectomies, as well as an L4-L5 right-sided synovial cyst resection. He says since surgery he feels better       . Coronary artery disease involving native coronary artery of native heart with angina pectoris ([place]) [I25.119] 12/30/2010     Non-ST elevation myocardial infarction., July 18,2012  LAD drug-eluting stent nov, 2012. Clopidogrel  See [name], cardiology       . Malignant neoplasm of prostate ([place]) [C61] 08/14/2010     Diagnosed 07/2010. Sees [name] (urol) and [name] (rad onc). Saw [name], [name]--[facility] [place]--palladium seed implants June 2012  Tracy Conway has an intermediate risk prostate cancer on the basis of a Gleason 7 histology       .  Lumbago [M54.5] 12/19/2009     March 21, 2012, underwent L4-L5 bilateral laminectomies and partial medial facetectomies, as well as an L4-L5 right-sided synovial cyst resection. He says since surgery he feels better       . Routine general medical examination at a health care facility [Z00.00] 05/15/2009     06/07/2012  Td/Tdap (q 10 yr):  2004, 2012  Pneumococcal (65 yoa and high risk x1):  2010, 2004  Influenza vaccine (q 1 yr):  2010 nov, 2011, 2012, 2013  Shingles: discussed--should get at his local pharmacy  Blood pressure:  Excellent 138/64  121/72  Cholesterol (q 5 yr begin age 29):on simvastatin oct 2012: excellent--will repeat  AAA screen (x1, male age 63, if ever smoked):  Checked at [facility]-- abdo and thoracic CT--aug 2012: stable ascending aortic aneurysm  Colorectal cancer: ct colonoscopy done 2008, repeat due 2013--done April 2013: neg   PSA:  Sees urology for fu prostate ca. Has had palladium seed implants  Tobacco:  NS x 11 yrs, 50 pack yr hx  Exercise:  Reg--treadmill x 20-30 mins then weight machines--every other day! Has not been back to the gym because of the back pain  Alcohol /Drugs:  None in 26 yrs--relapse jan 2014--did inpatient treatment--now none in 37 days!  Sexual practice/contraception:  Not an issue  Advanced directives:  Does have living will. DPOA  done       . Generalized osteoarthrosis, unspecified site [M15.9] 01/07/2007     Has seen orthopedics--cortisone shot to L knee helped.  L knee replacement 2008     . Diverticulitis of colon (without mention of hemorrhage) [K57.32] 01/07/2007     Hospitalized at [facility] May 2008     . Tobacco use disorder [F17.200] 12/24/2005     Quit 2000.  50 pack year history  EtOH--quit 1986.     Marland Kitchen Unspecified hearing loss [H91.90] 12/24/2005     Bilat hearing aids.     . ECZEMA  [L25.9] 12/24/2005     Lower extremities in particular.     Marland Kitchen ERECTILE DYSFUNCTION [N52.9] 12/24/2005   . Benign neoplasm of colon [D12.6] 12/24/2005     Tubular adenoma. Dx'ed  flex sig 2000--> colonoscopy 2000: N.  F/U 2003: mild diverticulosis, tubular adenoma.  F/U due 2008 with CT colonography.--done 7/08: ess. N.  Next due 2013       . Calculus of kidney [N20.0] 12/24/2005     Lithotripsy 1988  Stone panel in 0ct 09: high oxalate. Given low oxalae diet by Dr. Princella [name], nephrology in 2009.       Marland Kitchen Panlobular emphysema ([place]) [J43.1] 12/24/2005     05/2013: started by pulm on home O2  Emphysema: an FEV1 which is approximately at 33% of predicted    04/2010--Pulm: Chronic obstructive pulmonary disease, severe to very severe. I explained to him that continuing on the Spiriva as well as the Serevent was likely good therapy.I further told him that if he should have recurrent exacerbations within the course of the year that I would consider adding an inhaled corticosteroid at that point in time. He does not need oxygen at this point in time. He remains a nonsmoker. He is already engaged in a pulmonary rehabilitation.    Today I described to him how overall lung volume reduction surgery had no significant benefit on mortality. Subgroup analysis did indicate that those patients, who had upper lobe predominant disease with low exercise capacity were the most likely to benefit. Given the fact that he actually still has reasonably good functional status and is getting around and going to the gym and working out, it is not clear that he would be in the group that would most likely benefit, although, to safe for certain, we need to get a cardiopulmonary exercise test. At this point in time, he is feeling good with his chronic obstructive pulmonary disease therapy and is not interested in pursuing lung volume reduction surgery.    05/20/2010 CT angio: Addendum: Increased scarring in the right lung base since the prior   exam including the interval development of a new nodule. The   appearance suggests that the patient has had a recent infection of   the right lower lobe has resolved and since the prior  scan. However   recommend 3 months followup of the right lower lobe lesion given that   the patient is high risk with extensive paraseptal emphysema.     08/03/11 CT Chest  1. New indeterminate nodules in the lower lobes bilaterally, the largest   nodule measures 10 x 12 mm in the paraspinal location in the right lower lobe.  Possible considerations include infectious/inflammatory and less likely   metastasis.   2. Bilateral  upper lobe predominant moderate centrilobular and paraseptal   emphysema.   3. Unchanged dilated main pulmonary artery measuring 32 mm may indicate   pulmonary arterial hypertension.   4. Aortic aneurysm involving the ascending aorta at the level of the main   pulmonary artery measuring 47 mm.     10/2011:   Impression:  1. Previous opacities resolved. New focus of small airways inflammation in the  RUL. No worrisome nodule or opacity for malignancy. If followup of the new   airways inflammation is desired a 12 month study will be adequate.  2. Stable fibrosis and peripheral calcification in the RLL, which raises the   possibility of chronic aspiration.                  . Thoracic aneurysm without mention of rupture [I71.2] 12/24/2005     Ascending aortic aneurysm.  Followed by Dr. Ralene Cork, Cardiac surgery.  Gets regular CT scans.  Goal systolic BP: [phone]  Also seeing [name] cardiology  Also has AAA     . AAA (abdominal aortic aneurysm) ([place]) [I71.4] 12/24/2005     Father also had AAA. Borderline, seen on CT 8/06.  Also has thoracic aneurysm     . Ventricular septal defect [Q21.0] 12/24/2005     Small muscular VSD--NEEDS SBE PROPHYLAXIS. (Clindamycin 600 mg prior to dental visits.)     . Hypertrophy of prostate without urinary obstruction and other lower urinary tract symptoms (LUTS) [N40.0] 12/24/2005   . Chronic Kidney Disease -- Stage III [N25.89] 07/22/2005     Creat ~ 1.8. Normal albuminuria (8/08)  Likely 2/2 HTN, poss component from hx obstructing nephrolithiasis.  8/08 Renal: referred  to Uro for 48mm stone, nonobstructing. Also doing w/u for stone type, poss target prevention.     Marland Kitchen HYPERTENSION NOS(aka HTN) [I10] 03/29/2002   . Pure hypercholesterolemia [E78.00] 03/29/2002   . MALIG NEOPLASM DORSAL TONGUE(aka CANCER) [C02.0] 03/29/2002     [place], T1N1 squamous cell ca L tongue, partial glossectomy; L supraorbital hyoid neck dissection           MEDS:    Outpatient Prescriptions Prior to Visit   Medication Sig Dispense Refill   . Acamprosate Calcium 333 MG Oral Tab EC TAKE 1 TABLET THREE TIMES A DAY 270 tablet 0   . Albuterol Sulfate HFA 108 (90 BASE) MCG/ACT Inhalation Aero Soln Inhale 2 puffs by mouth every 4 hours as needed for shortness of breath/wheezing. For wheezing and/or cough. 1 Inhaler 12   . Aspirin 81 MG Oral Tab 1 tablet by mouth once daily     . Atorvastatin Calcium 40 MG Oral Tab Take 1 tablet (40 mg) by mouth daily. 90 tablet 3   . Budesonide-Formoterol Fumarate 160-4.5 MCG/ACT Inhalation Aerosol Inhale 2 puffs by mouth every 12 hours. 3 Inhaler 4   . Cyanocobalamin (B-12) 100 MCG Oral Tab Take 100 mcg by mouth.     . Furosemide 20 MG Oral Tab Take 1 tablet (20 mg) by mouth daily. 90 tablet 3   . Metoprolol Succinate ER 100 MG Oral TABLET SR 24 HR Take 1 tablet (100 mg) by mouth daily. (Dose per discharge summary on 01/20/13) 90 tablet 3   . Multiple Vitamins-Minerals (MULTIVITAMIN OR) Once daily     . NIFEdipine ER (AFEDITAB CR) 30 MG Oral TABLET SR 24 HR Take 1 tablet (30 mg) by mouth daily. 90 tablet 4   . Nystatin 100000 UNIT/ML Mouth/Throat Suspension Swish and Swallow 5 mL 3  times a day for 14 days. Swish 1/2 dose in each side of mouth for as long as possible before swallowing. 473 mL 0   . Pantoprazole Sodium 40 MG Oral Tab EC Take 1 tablet (40 mg) by mouth every morning. 90 tablet 3   . Tamsulosin HCl 0.4 MG Oral Cap Take 1 capsule (0.4 mg) by mouth at bedtime. 90 capsule 3   . Tiotropium Bromide Monohydrate (SPIRIVA HANDIHALER) 18 MCG Inhalation Cap Inhale the  contents of 1 capsule via Handihaler once daily. After breathing out, inhale a second time. 90 capsule 2   . Triamcinolone Acetonide 0.1 % External Ointment Apply 1 application topically 2 times a day. Apply to affected areas bid 80 g 1     No facility-administered medications prior to visit.       INTERIM HX:     Since last visit,  Tracy Conway was admitted to the hospital for a partial SBO which fortunately did not require surgery.      With regard to his COPD,  He has bee doing well.  No exacerbations.  He takes his medications as directed.      PE:     BP 140/82 mmHg  Pulse 72  Temp(Src) 98.4 F (36.9 C) (Temporal)  Ht 5\' 11"  (1.803 m)  Wt 215 lb (97.523 kg)  BMI 30.00 kg/m2  SpO2 96%  GEN: Well appearing white male using oxygen.   HEENT: varicosities over nose.   Lungs:Diminished BS but clear.   Heart: No JVD.  S1 and S2 distant without S3 or M.   Abd: obese,  Benign without mass or organomegally.  Ext: warm and without edema.     LAB:    None today.     Assessment/Plan:     PROBLEM #1: Severe COPD.     A: Tracy Conway has been quite stable.  He is using medications appropriately and has not been having increasing symptoms.  Today,  He requested that we fill out a POLST form together.  He wants DNAR but would be intubated with exacerbation if needed.  We also discussed lung cancer screening which he does not want to undergo at this time.        P:   1. POLST filled out and a copy placed in chart as he requested.   2. Continue current medications.   3. RTC in 6 months or sooner if needed.

## 2015-06-27 ENCOUNTER — Ambulatory Visit: Payer: Self-pay

## 2015-06-27 NOTE — Telephone Encounter (Signed)
-----   Message from Kristopher Glee sent at 06/27/2015  5:59 PM PST -----  Regarding: [place]:  Problems breathing, has the shivers, temp of 103.1; BP 86/53  >> Har[facility] 06/27/2015 05:59 PM  [place]:  Problems breathing, has the shivers, temp of 103.1; BP 86/53

## 2015-06-27 NOTE — Telephone Encounter (Signed)
Protocol: BREATHING DIFFICULTY-ADULT-AH  Affirmative: [1] MODERATE difficulty breathing (e.g., speaks in phrases, SOB even at rest, pulse 100-120) AND [2] NEW-onset or WORSE than normal  Disposition of Go to ED Now suggested.      RN spoke with pt's wife who states pt was in bed when she got home from work. States his breathing was very rapid and shallow. Recommended ED which pt declined. Pt's temp was 103 F in ear, 101 F orally. BP was 80s/50s with a Pulse in 90s.     Advised ED visit now.   Wife will call [phone]. Advised pt call back if symptoms change or worsen or with any questions or concerns.

## 2015-06-28 ENCOUNTER — Inpatient Hospital Stay: Payer: Self-pay

## 2015-06-28 ENCOUNTER — Encounter (HOSPITAL_BASED_OUTPATIENT_CLINIC_OR_DEPARTMENT_OTHER): Payer: Self-pay

## 2015-07-01 ENCOUNTER — Encounter (INDEPENDENT_AMBULATORY_CARE_PROVIDER_SITE_OTHER): Payer: Self-pay | Admitting: Family Medicine

## 2015-07-01 ENCOUNTER — Other Ambulatory Visit: Payer: Self-pay

## 2015-07-02 ENCOUNTER — Encounter (INDEPENDENT_AMBULATORY_CARE_PROVIDER_SITE_OTHER): Payer: Self-pay | Admitting: Family Medicine

## 2015-07-04 ENCOUNTER — Encounter (INDEPENDENT_AMBULATORY_CARE_PROVIDER_SITE_OTHER): Payer: No Typology Code available for payment source | Admitting: Family Medicine

## 2015-07-05 ENCOUNTER — Other Ambulatory Visit: Payer: Self-pay

## 2015-07-11 ENCOUNTER — Ambulatory Visit (INDEPENDENT_AMBULATORY_CARE_PROVIDER_SITE_OTHER): Payer: No Typology Code available for payment source | Admitting: Family Medicine

## 2015-07-11 ENCOUNTER — Encounter (INDEPENDENT_AMBULATORY_CARE_PROVIDER_SITE_OTHER): Payer: Self-pay | Admitting: Family Medicine

## 2015-07-11 VITALS — BP 125/63 | HR 68 | Temp 98.4°F | Resp 16 | Wt 216.0 lb

## 2015-07-11 DIAGNOSIS — R0602 Shortness of breath: Secondary | ICD-10-CM

## 2015-07-11 DIAGNOSIS — Z09 Encounter for follow-up examination after completed treatment for conditions other than malignant neoplasm: Secondary | ICD-10-CM

## 2015-07-11 NOTE — Progress Notes (Signed)
Reason for Visit: ER follow up    Refills? NO  Referral? NO  Letter or Form? NO  Lab Results? NO    HEALTH MAINTENANCE:  Has the patient had this done since their last visit?  Cervical screening/PAP: N/A  Mammo: N/A  Colon Screen: 2019    Have you seen a specialist since your last visit: No    Vaccines Due? No    HM Due:   Health Maintenance   Topic Date Due   . Colonoscopy  07/14/2017   . Cholesterol Test  04/15/2020   . Tetanus Vaccine  05/20/2020   . Zoster Vaccine  Completed   . Pneumococcal Vaccine  Completed   . Influenza Vaccine  Completed       PCP Verified?  Yes, Tanner, Deitra Mayo, MD

## 2015-07-11 NOTE — Progress Notes (Signed)
[facility] OUTPATIENT VISIT     Chief Complaint:    Tracy Conway is a 81 year old male who comes to clinic today primarily for follow-up after hospitalization for shortness of breath.    History of Present Complaint(s):    -this is a patient of Dr. Mellissa Kohut that I am meeting for the first time  -he has a hx of COPD and is on home oxygen, 2L  -patient states he was admitted to [facility] hospital for SOB and was just discharged a few days ago  -per hospital records, it appears that he was treated for presumed pneumonia, though no consolidation was seen on XR  -he received IV antibiotics and was transitioned to oral abx, which he has finished  -pt was also treated for presumed COPD exacerbation and was given oral steroids  -he was also evaluated for possible CHF: BNP was 603 at the hospital and echocardiogram showed normal EF  -pt was treated with IV Lasix as well    -is feeling much better now  -on 2L of oxygen which is his baseline; O2 sat at home are around 94-97 with oxygen; 92-93 without oxygen when simply sitting - this is again his baseline  -has finished oral antibiotics (azithromycin) and oral steroids as well  -no fevers, chills, sweats  -pt says he is sleeping well and appetite is good  -has actually gone to the gym 3x since discharge  -is going to participate in a trial for a new COPD medication - wants to know if this is ok with Dr. Satira Sark, his PCP  -has follow-up with [name] (cardiology) on 07/30/15    Interpreter needed? No    Review of Systems:  Complete ROS negative except as listed above in the HPI.    PMH:  Patient Active Problem List   Diagnosis   . HYPERTENSION NOS(aka HTN)   . Pure hypercholesterolemia   . MALIG NEOPLASM DORSAL TONGUE(aka CANCER)   . Chronic Kidney Disease -- Stage III   . Tobacco use disorder   . Unspecified hearing loss   . ECZEMA    . ERECTILE DYSFUNCTION   . Benign neoplasm of colon   . Calculus of kidney   . Panlobular emphysema ([place])   . Thoracic  aneurysm without mention of rupture   . AAA (abdominal aortic aneurysm) ([place])   . Ventricular septal defect   . Hypertrophy of prostate without urinary obstruction and other lower urinary tract symptoms (LUTS)   . Generalized osteoarthrosis, unspecified site   . Diverticulitis of colon (without mention of hemorrhage)   . Routine general medical examination at a health care facility   . Lumbago   . Malignant neoplasm of prostate ([place])   . Coronary artery disease involving native coronary artery of native heart with angina pectoris ([place])   . Lumbar stenosis with neurogenic claudication   . Major depressive disorder with single episode, in full remission ([place])   . History of non-ST elevation myocardial infarction (NSTEMI)   . Alcohol use disorder, moderate, in sustained remission   . Carotid stenosis   . [place]   . Pneumothorax on right   . Right rib fracture   . Vocal fold atrophy   . ventricular polyps   . Dysphonia     Past Surgical History   Procedure Laterality Date   . Ventral hernia repair       x3   . Removal of tongue carcinoma with lymphadenectomy     .  Fusion l foot distal phalange     . Lithotrypsy of r renal calculi     . Retinaculum release b/l wrists     . Vasectomy w/tray       x2   . Vasectomy reversal     . Rpr 1st ingun hrna age 9 mo-5 yrs reducible     . Revj tot knee arthrp fem&entire tibial compone       left knee   . Ligation prq vas deferens uni/bi spx     . Brachy therapy     . Lipotripsy surgery     . Heart stent placement  09/2010, 02/2011     [place]   . Unlisted procedure spine  Mar 21 2012     L spine surgery        Medications:  Outpatient Prescriptions Prior to Visit   Medication Sig Dispense Refill   . Acamprosate Calcium 333 MG Oral Tab EC TAKE 1 TABLET THREE TIMES A DAY 270 tablet 0   . Albuterol Sulfate HFA 108 (90 BASE) MCG/ACT Inhalation Aero Soln Inhale 2 puffs by mouth every 4 hours as needed for shortness of breath/wheezing. For wheezing and/or cough. 1 Inhaler 12   . Aspirin 81 MG  Oral Tab 1 tablet by mouth once daily     . Atorvastatin Calcium 40 MG Oral Tab Take 1 tablet (40 mg) by mouth daily. 90 tablet 3   . Budesonide-Formoterol Fumarate 160-4.5 MCG/ACT Inhalation Aerosol Inhale 2 puffs by mouth every 12 hours. 3 Inhaler 4   . Cyanocobalamin (B-12) 100 MCG Oral Tab Take 100 mcg by mouth.     . Furosemide 20 MG Oral Tab Take 1 tablet (20 mg) by mouth daily. 90 tablet 3   . Metoprolol Succinate ER 100 MG Oral TABLET SR 24 HR Take 1 tablet (100 mg) by mouth daily. (Dose per discharge summary on 01/20/13) 90 tablet 3   . Multiple Vitamins-Minerals (MULTIVITAMIN OR) Once daily     . NIFEdipine ER (AFEDITAB CR) 30 MG Oral TABLET SR 24 HR Take 1 tablet (30 mg) by mouth daily. 90 tablet 4   . Nystatin 100000 UNIT/ML Mouth/Throat Suspension Swish and Swallow 5 mL 3 times a day for 14 days. Swish 1/2 dose in each side of mouth for as long as possible before swallowing. 473 mL 0   . Pantoprazole Sodium 40 MG Oral Tab EC Take 1 tablet (40 mg) by mouth every morning. 90 tablet 3   . Tamsulosin HCl 0.4 MG Oral Cap Take 1 capsule (0.4 mg) by mouth at bedtime. 90 capsule 3   . Tiotropium Bromide Monohydrate (SPIRIVA HANDIHALER) 18 MCG Inhalation Cap Inhale the contents of 1 capsule via Handihaler once daily. After breathing out, inhale a second time. 90 capsule 2   . Triamcinolone Acetonide 0.1 % External Ointment Apply 1 application topically 2 times a day. Apply to affected areas bid 80 g 1     No facility-administered medications prior to visit.       Allergies:  Review of patient's allergies indicates:  Allergies   Allergen Reactions   . Penicillin G      hives     . Percocet [Oxycodone-Acetaminophen]      indigestion        Physical Exam:  BP 125/63 mmHg  Pulse 68  Temp(Src) 98.4 F (36.9 C) (Temporal)  Resp 16  Wt 216 lb (97.977 kg)  SpO2 94%  GEN:  Well-appearing, pleasant, no acute distress.  HEENT:  Normocephalic, atraumatic, sclera/conjunctiva clear. Has nasal cannula in place.  PULM:   CTAB, no wheezes or crackles or abnormal focal breath sounds.  CV:  RRR, no murmurs, gallops, or rubs.  MS:  Normal gait. Moves all extremities.  NEURO:  Alert and oriented.  ________________________________________________________________________    ASSESSMENT/PLAN::  Quintavis Tranchina is a 81 year old male who is presenting for follow-up after recent hospitalization for shortness of breath, most likely multifactorial in origin including pneumonia and COPD exacerbation. Patient is feeling much improved and has finished his antibiotics and steroid treatment. Exam was reassuring. Recommended that he follow-up with cardiology as planned in early April. In regards to his participation in the COPD medication trial, I recommended that he message Dr. Satira Sark regarding this, as she is his regular PCP and knows him quite well. Patient agreed he would do this.      Follow-up:  PRN if symptoms do not improve      --  Titus Mould, MD  [facility]  Phone [phone]

## 2015-07-11 NOTE — Progress Notes (Signed)
81 yo with COPD on home O2.  Admitted recently for pneumonia/COPD exacerbation at [facility], had nl EF on Echo. Here for f/u, now has finished prednisone and oral antibx.  Has been able to return to the gym.  Interested in participating in trial for new COPD exacerbation.

## 2015-07-17 NOTE — Progress Notes (Signed)
I have personally discussed the case with the resident during or immediately after the patient visit including review of history, physical exam, diagnosis, and treatment plan. I agree with the assessment and plan of care.

## 2015-07-25 ENCOUNTER — Other Ambulatory Visit (INDEPENDENT_AMBULATORY_CARE_PROVIDER_SITE_OTHER): Payer: Self-pay | Admitting: Family Medicine

## 2015-07-25 DIAGNOSIS — K219 Gastro-esophageal reflux disease without esophagitis: Secondary | ICD-10-CM

## 2015-07-25 MED ORDER — PANTOPRAZOLE SODIUM 40 MG OR TBEC
DELAYED_RELEASE_TABLET | ORAL | Status: DC
Start: 2015-07-25 — End: 2016-02-14

## 2015-07-30 ENCOUNTER — Encounter (HOSPITAL_BASED_OUTPATIENT_CLINIC_OR_DEPARTMENT_OTHER): Payer: No Typology Code available for payment source | Admitting: Cardiovascular Disease

## 2015-07-31 ENCOUNTER — Encounter (INDEPENDENT_AMBULATORY_CARE_PROVIDER_SITE_OTHER): Payer: No Typology Code available for payment source | Admitting: Family Medicine

## 2015-08-06 ENCOUNTER — Ambulatory Visit (HOSPITAL_BASED_OUTPATIENT_CLINIC_OR_DEPARTMENT_OTHER): Payer: No Typology Code available for payment source | Admitting: Cardiovascular Disease

## 2015-08-07 ENCOUNTER — Encounter (INDEPENDENT_AMBULATORY_CARE_PROVIDER_SITE_OTHER): Payer: Self-pay | Admitting: Family Medicine

## 2015-08-07 DIAGNOSIS — I10 Essential (primary) hypertension: Secondary | ICD-10-CM

## 2015-08-07 DIAGNOSIS — I712 Thoracic aortic aneurysm, without rupture, unspecified: Secondary | ICD-10-CM

## 2015-08-07 DIAGNOSIS — I714 Abdominal aortic aneurysm, without rupture, unspecified: Secondary | ICD-10-CM

## 2015-08-07 DIAGNOSIS — I252 Old myocardial infarction: Secondary | ICD-10-CM

## 2015-08-07 DIAGNOSIS — Q210 Ventricular septal defect: Secondary | ICD-10-CM

## 2015-08-07 DIAGNOSIS — I25119 Atherosclerotic heart disease of native coronary artery with unspecified angina pectoris: Secondary | ICD-10-CM

## 2015-08-07 NOTE — Telephone Encounter (Signed)
Generic response sent. Routing to referral pool.

## 2015-08-09 NOTE — Telephone Encounter (Signed)
Dr. Satira Sark: Please advise on referral redirection. Thanks.

## 2015-08-15 ENCOUNTER — Encounter (INDEPENDENT_AMBULATORY_CARE_PROVIDER_SITE_OTHER): Payer: Self-pay | Admitting: Family Medicine

## 2015-08-15 DIAGNOSIS — IMO0002 Reserved for concepts with insufficient information to code with codable children: Secondary | ICD-10-CM | POA: Insufficient documentation

## 2015-08-15 NOTE — Telephone Encounter (Signed)
Closing as duplicate.

## 2015-08-15 NOTE — Telephone Encounter (Signed)
Pt sent in another message :     "I have not heard back about my request for a referral i submitted on 08/07/2015."  Routing to Dr. Satira Sark

## 2015-08-15 NOTE — Telephone Encounter (Signed)
[facility] reply sent.

## 2015-08-15 NOTE — Telephone Encounter (Signed)
I placed a referral to [facility] cardiology for Mr. Yee.  Can you help get this organized?

## 2015-08-16 NOTE — Telephone Encounter (Signed)
Patient is scheduled on 5/24 at [facility] cardiology.

## 2015-09-05 ENCOUNTER — Other Ambulatory Visit (HOSPITAL_BASED_OUTPATIENT_CLINIC_OR_DEPARTMENT_OTHER): Payer: Self-pay | Admitting: Cardiovascular Disease

## 2015-09-05 DIAGNOSIS — R0689 Other abnormalities of breathing: Secondary | ICD-10-CM

## 2015-09-05 DIAGNOSIS — R0600 Dyspnea, unspecified: Secondary | ICD-10-CM

## 2015-09-05 MED ORDER — FUROSEMIDE 20 MG OR TABS
20.0000 mg | ORAL_TABLET | Freq: Every day | ORAL | Status: DC
Start: 2015-09-05 — End: 2015-11-02

## 2015-09-07 ENCOUNTER — Other Ambulatory Visit (INDEPENDENT_AMBULATORY_CARE_PROVIDER_SITE_OTHER): Payer: Self-pay | Admitting: Family Medicine

## 2015-09-07 DIAGNOSIS — I10 Essential (primary) hypertension: Secondary | ICD-10-CM

## 2015-09-09 MED ORDER — METOPROLOL SUCCINATE ER 100 MG OR TB24
100.0000 mg | EXTENDED_RELEASE_TABLET | Freq: Every day | ORAL | Status: DC
Start: 2015-09-09 — End: 2016-02-14

## 2015-09-17 NOTE — Progress Notes (Signed)
Consultation Note    Primary Care Provider: Levora Angel, MD    DOB:  [birth year]    CHIEF COMPLAINT: establish care    HISTORY OF PRESENT ILLNESS:   The patient is referred by Dr Satira Sark for evaluation of CAD and additional issues.     This is a 81 year old male with prior coronary stenting at [facility] for an MI in 2013 or so.  His presentation was acute jaw pain and left arm pain. Never uses nitro. His current activity level includes attending a gym using a stationary bike and some cable weights. Uses his oxygen and turns it up to 2 liters and this is sufficient.  Most recent echo at [facility] (was in for PNA) showed normal LV function, no significant valve disease.    He has oxygen dependent COPD on 1-2 liters continuous oxygen. Follows with Pryor Curia, MD. Uses 2 inhalers. Not on prednisone, and rarely needs  It.  He has no history of Afib. Just on aspirin daily no history of stroke.  He has an ascending aortic aneurysm, found incidentally and last measured on non contrast CT at 4.4 cm in September 2016 and stable from 2013. He has been followed by annual CT.    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  He reports a history of heavy drinking and stopped in 1987. He was drinking  Half gallon of whiskey per day. He went to inpatient rehab at the [facility].    He had a lot medical problems that prompted him to resume 3 glasses of wine per day currently.  He still attends AA.    CARDIAC RISK FACTORS:   treated hypertension; no diabetes; former smoker; treated hyperlipidemia; overweight; no premature family history of coronary artery disease;      PAST MEDICAL HISTORY: see problem list    MEDICATIONS:  Current Outpatient Prescriptions   Medication Sig Dispense Refill   . Albuterol Sulfate HFA 108 (90 BASE) MCG/ACT Inhalation Aero Soln Inhale 2 puffs by mouth every 4 hours as needed for shortness of breath/wheezing. For wheezing and/or cough. 1 Inhaler 12   . Aspirin 81 MG Oral Tab  1 tablet by mouth once daily     . Atorvastatin Calcium 40 MG Oral Tab Take 1 tablet (40 mg) by mouth daily. 90 tablet 3   . Budesonide-Formoterol Fumarate 160-4.5 MCG/ACT Inhalation Aerosol Inhale 2 puffs by mouth every 12 hours. 3 Inhaler 4   . Cyanocobalamin (B-12) 100 MCG Oral Tab Take 100 mcg by mouth.     . Furosemide 20 MG Oral Tab Take 1 tablet (20 mg) by mouth daily. 90 tablet 0   . Metoprolol Succinate ER 100 MG Oral TABLET SR 24 HR Take 1 tablet (100 mg) by mouth daily. 90 tablet 1   . Multiple Vitamins-Minerals (MULTIVITAMIN OR) Once daily     . NIFEdipine ER (AFEDITAB CR) 30 MG Oral TABLET SR 24 HR Take 1 tablet (30 mg) by mouth daily. 90 tablet 4   . Nystatin 100000 UNIT/ML Mouth/Throat Suspension Swish and Swallow 5 mL 3 times a day for 14 days. Swish 1/2 dose in each side of mouth for as long as possible before swallowing. 473 mL 0   . Pantoprazole Sodium 40 MG Oral Tab EC TAKE 1 TABLET EVERY MORNING (Patient taking differently: TAKE 1 TABLET EVERY PM) 90 tablet 1   . Polyethylene Glycol 3350 (MIRALAX) Oral Powder Take 17 g by  mouth.     . Tamsulosin HCl 0.4 MG Oral Cap Take 1 capsule (0.4 mg) by mouth at bedtime. 90 capsule 3   . Tiotropium Bromide Monohydrate (SPIRIVA HANDIHALER) 18 MCG Inhalation Cap Inhale the contents of 1 capsule via Handihaler once daily. After breathing out, inhale a second time. 90 capsule 2   . Triamcinolone Acetonide 0.1 % External Ointment Apply 1 application topically 2 times a day. Apply to affected areas bid 80 g 1     No current facility-administered medications for this visit.        ALLERGIES:  Penicillin g and Percocet [oxycodone-acetaminophen]    PROBLEM LIST:  Patient Active Problem List    Diagnosis Date Noted   . 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 ventricular polyps 10/16/2014   . Dysphonia 10/16/2014   . Pneumothorax on right 01/22/2014   . Right rib fracture 01/22/2014   . Falls 10/26/2013     Inpatient evaluation September 2014, felt to have micturition  syncope.  7/15 -- orthostatic hypotension sxs, BPs in afternoon in mid to high 90s to low [id number] systolic, 123456 to 123456 diastolic. D/Ced Nifedipine.     . Alcohol use disorder, moderate, in sustained remission      Had been in remission for many years but recently relapsed. Goes to [place] very regularly       . Carotid stenosis             . Major depressive disorder with single episode, in full remission ([place]) 11/10/2012   . Lumbar stenosis with neurogenic claudication 02/05/2012     March 21, 2012, underwent L4-L5 bilateral laminectomies and partial medial facetectomies, as well as an L4-L5 right-sided synovial cyst resection. He says since surgery he feels better       . Coronary artery disease involving native coronary artery of native heart with angina pectoris ([place]) 12/30/2010     Non-ST elevation myocardial infarction., July 18,2012  LAD drug-eluting stent nov, 2012. Clopidogrel         . Malignant neoplasm of prostate ([place]) 08/14/2010     Diagnosed 07/2010. Sees [name] (urol) and [name] (rad onc). Saw [name], [name]--prostate cancer center of --palladium seed implants June 2012  Mr. Lawery has an intermediate risk prostate cancer on the basis of a Gleason 7 histology       . Lumbago 12/19/2009     March 21, 2012, underwent L4-L5 bilateral laminectomies and partial medial facetectomies, as well as an L4-L5 right-sided synovial cyst resection. He says since surgery he feels better       . Routine general medical examination at a health care facility 05/15/2009     06/07/2012  Td/Tdap (q 10 yr):  2004, 2012  Pneumococcal (65 yoa and high risk x1):  2010, 2004  Influenza vaccine (q 1 yr):  2010 nov, 2011, 2012, 2013  Shingles: discussed--should get at his local pharmacy  Blood pressure:  Excellent 138/64  121/72  Cholesterol (q 5 yr begin age 87):on simvastatin oct 2012: excellent--will repeat  AAA screen (x1, male age 60, if ever smoked):  Checked at [facility]-- abdo and thoracic CT--aug 2012:  stable ascending aortic aneurysm  Colorectal cancer: ct colonoscopy done 2008, repeat due 2013--done April 2013: neg   PSA:  Sees urology for fu prostate ca. Has had palladium seed implants  Tobacco:  NS x 11 yrs, 50 pack yr hx  Exercise:  Reg--treadmill x 20-30 mins then weight machines--every other day! Has not  been back to the gym because of the back pain  Alcohol /Drugs:  None in 26 yrs--relapse jan 2014--did inpatient treatment--now none in 37 days!  Sexual practice/contraception:  Not an issue  Advanced directives:  Does have living will. DPOA  done       . Generalized osteoarthrosis, unspecified site 01/07/2007     Has seen orthopedics--cortisone shot to L knee helped.  L knee replacement 2008     . Diverticulitis of colon (without mention of hemorrhage) 01/07/2007     Hospitalized at [facility] May 2008     . Tobacco use disorder 12/24/2005     Quit 2000.  50 pack year history  EtOH--quit 1986.     Marland Kitchen Unspecified hearing loss 12/24/2005     Bilat hearing aids.     . ECZEMA  12/24/2005     Lower extremities in particular.     Marland Kitchen ERECTILE DYSFUNCTION 12/24/2005   . Benign neoplasm of colon 12/24/2005     Tubular adenoma. Dx'ed flex sig 2000--> colonoscopy 2000: N.  F/U 2003: mild diverticulosis, tubular adenoma.  F/U due 2008 with CT colonography.--done 7/08: ess. N.  Next due 2013       . Calculus of kidney 12/24/2005     Lithotripsy 1988  Stone panel in 0ct 09: high oxalate. Given low oxalae diet by Dr. Princella [name], nephrology in 2009.       Marland Kitchen Panlobular emphysema ([place]) 12/24/2005     05/2013: started by pulm on home O2  Emphysema: an FEV1 which is approximately at 33% of predicted    04/2010--Pulm: Chronic obstructive pulmonary disease, severe to very severe. I explained to him that continuing on the Spiriva as well as the Serevent was likely good therapy.I further told him that if he should have recurrent exacerbations within the course of the year that I would consider adding an inhaled corticosteroid at that point in  time. He does not need oxygen at this point in time. He remains a nonsmoker. He is already engaged in a pulmonary rehabilitation.    Today I described to him how overall lung volume reduction surgery had no significant benefit on mortality. Subgroup analysis did indicate that those patients, who had upper lobe predominant disease with low exercise capacity were the most likely to benefit. Given the fact that he actually still has reasonably good functional status and is getting around and going to the gym and working out, it is not clear that he would be in the group that would most likely benefit, although, to safe for certain, we need to get a cardiopulmonary exercise test. At this point in time, he is feeling good with his chronic obstructive pulmonary disease therapy and is not interested in pursuing lung volume reduction surgery.    05/20/2010 CT angio: Addendum: Increased scarring in the right lung base since the prior   exam including the interval development of a new nodule. The   appearance suggests that the patient has had a recent infection of   the right lower lobe has resolved and since the prior scan. However   recommend 3 months followup of the right lower lobe lesion given that   the patient is high risk with extensive paraseptal emphysema.     08/03/11 CT Chest  1. New indeterminate nodules in the lower lobes bilaterally, the largest   nodule measures 10 x 12 mm in the paraspinal location in the right lower lobe.  Possible considerations include infectious/inflammatory and less likely   metastasis.  2. Bilateral upper lobe predominant moderate centrilobular and paraseptal   emphysema.   3. Unchanged dilated main pulmonary artery measuring 32 mm may indicate   pulmonary arterial hypertension.   4. Aortic aneurysm involving the ascending aorta at the level of the main   pulmonary artery measuring 47 mm.     10/2011:   Impression:  1. Previous opacities resolved. New focus of small airways inflammation in  the  RUL. No worrisome nodule or opacity for malignancy. If followup of the new   airways inflammation is desired a 12 month study will be adequate.  2. Stable fibrosis and peripheral calcification in the RLL, which raises the   possibility of chronic aspiration.                  . Thoracic aneurysm without mention of rupture 12/24/2005     Ascending aortic aneurysm.  Followed by Dr. Ralene Cork, Cardiac surgery.  Gets regular CT scans.  Goal systolic BP: 99991111  Also has AAA     . AAA (abdominal aortic aneurysm) ([place]) 12/24/2005     Father also had AAA. Borderline, seen on CT 8/06.  Also has thoracic aneurysm     . Ventricular septal defect 12/24/2005     Small muscular VSD--NEEDS SBE PROPHYLAXIS. (Clindamycin 600 mg prior to dental visits.)     . Hypertrophy of prostate without urinary obstruction and other lower urinary tract symptoms (LUTS) 12/24/2005   . Chronic Kidney Disease -- Stage III 07/22/2005     Creat ~ 1.8. Normal albuminuria (8/08)  Likely 2/2 HTN, poss component from hx obstructing nephrolithiasis.  8/08 Renal: referred to Uro for 27mm stone, nonobstructing. Also doing w/u for stone type, poss target prevention.     Marland Kitchen HYPERTENSION NOS(aka HTN) 03/29/2002   . Pure hypercholesterolemia 03/29/2002   . MALIG NEOPLASM DORSAL TONGUE(aka CANCER) 03/29/2002     Ssurgery 1994, T1N1 squamous cell ca L tongue, partial glossectomy; L supraorbital hyoid neck dissection             FAMILY HISTORY:  fnoncontributory    SOCIAL HISTORY: has 5 daughters all living in [place]. Quit smoking 2000    REVIEW OF SYSTEMS:   CONSTITUTIONAL:  No fever, weight loss.  EYES:  No visual loss.  ENT:  No hearing loss, exudate.  CARDIOVASCULAR:  See HPI.  GI:  No melena, hematochezia, hematemesis.  SKIN:  No lesions.  GU:  No dysuria, hematuria.  MUSCULOSKELETAL:  No myalgias, no arthralgias.  NEURO:  No TIA or CVA symptoms.  ALLERGIC/IMMUNOLOGIC:  No hives or rash.  PSYCHIATRIC:  No suicidal ideations.     PHYSICAL EXAM:   CONSTITUTIONAL:  This is a  male in no distress.   BP 132/74   Pulse 80   Resp (!) 96 Comment: 1 liter of O2  Ht 5\' 11"  (1.803 m) Comment: pt provided  Wt 217 lb 4.8 oz (98.6 kg)   BMI 30.31 kg/m     NEURO/PSYCH:  Oriented x 3 with normal affect.  EYES:  No corneal arcus or scleral icterus.  E/N/T:  Oral mucosa pink, dentition normal. normal.  Thyroid is not enlarged.  RESPIRATORY:  Respiratory effort is normal.  Lungs are clear to auscultation/percussion.  CARDIOVASCULAR:  PMI at 5th ICS/MCL.  regular rhythm.  S1, S2 normal.  No S3, S4, no murmur.  Jugular venous pressure normal.  Carotid upstrokes are brisk, sift bialteral  bruits.  Radial and brachial pulses are symmetric and normal.  Pedal pulses normal  bilaterally.  GASTROINTESTINAL:  Abdomen no bruit   No hepatomegaly.  EXTREMITIES: no edema No clubbing or cyanosis.  MUSCULOSKELETAL:  Normal gait and muscle strength.  SKIN:  Warm and dry, no lesions.    EKG:  NSR normal  ECG from 06/28/15 is personally reviewed. Computer reads Afib, but over read confirms NSR with P waves best seen in V3. Significant baseline artefact.    IMPRESSION:   1. CAD with remote NSTEMI and LAD stenting without symptoms of angina at moderate level of activity.  2. Ascending aortic aneurysm followed by non-contrast CT. Relatively stable ab 4.4 cm .  Has been followed by Dr. Ralene Cork as well.  3. Oxygen dependent COPD.  4.  Outside ECG which computer read as Afib (erroneous reading on this tracing) but is in NSR. He remains at risk for this, but no current diagnosis is confirmed.  5. Hyperlipidemia.  Last LDL 112 and would suggest lowering this further and increasing atorvastatin to 80 mg daily.    PLAN:   1. Increase Lipitor to 80 mg qd  2. Continue aspirin.  3. Non-contrast CT in September  4. See me afterward.  5. Should have nitrostat prn    Almyra Free P. Myrle Sheng, MD  CC:  Levora Angel, MD

## 2015-09-18 ENCOUNTER — Encounter (INDEPENDENT_AMBULATORY_CARE_PROVIDER_SITE_OTHER): Payer: Self-pay | Admitting: Cardiovascular Disease

## 2015-09-18 ENCOUNTER — Ambulatory Visit (INDEPENDENT_AMBULATORY_CARE_PROVIDER_SITE_OTHER): Payer: No Typology Code available for payment source | Admitting: Cardiovascular Disease

## 2015-09-18 ENCOUNTER — Encounter (INDEPENDENT_AMBULATORY_CARE_PROVIDER_SITE_OTHER): Payer: Self-pay

## 2015-09-18 VITALS — BP 132/74 | HR 80 | Resp 96 | Ht 71.0 in | Wt 217.3 lb

## 2015-09-18 DIAGNOSIS — I714 Abdominal aortic aneurysm, without rupture, unspecified: Secondary | ICD-10-CM

## 2015-09-18 DIAGNOSIS — I25758 Atherosclerosis of native coronary artery of transplanted heart with other forms of angina pectoris: Secondary | ICD-10-CM

## 2015-09-18 DIAGNOSIS — I4891 Unspecified atrial fibrillation: Secondary | ICD-10-CM

## 2015-09-18 MED ORDER — ATORVASTATIN CALCIUM 80 MG OR TABS
80.0000 mg | ORAL_TABLET | Freq: Every day | ORAL | 3 refills | Status: DC
Start: 2015-09-18 — End: 2016-05-26

## 2015-09-18 MED ORDER — NITROGLYCERIN 0.4 MG SL SUBL
0.4000 mg | SUBLINGUAL_TABLET | SUBLINGUAL | 3 refills | Status: AC | PRN
Start: 2015-09-18 — End: 2015-09-19

## 2015-09-18 NOTE — Patient Instructions (Signed)
1. Increase Lipitor to 80 mg qd  2. Continue aspirin.  3. Non-contrast CT in September  4. See me afterward.  5. Should have nitrostat prn

## 2015-09-19 ENCOUNTER — Other Ambulatory Visit (INDEPENDENT_AMBULATORY_CARE_PROVIDER_SITE_OTHER): Payer: Self-pay | Admitting: Cardiovascular Disease

## 2015-09-19 ENCOUNTER — Encounter (INDEPENDENT_AMBULATORY_CARE_PROVIDER_SITE_OTHER): Payer: Self-pay | Admitting: Cardiovascular Disease

## 2015-09-19 DIAGNOSIS — I25119 Atherosclerotic heart disease of native coronary artery with unspecified angina pectoris: Secondary | ICD-10-CM

## 2015-09-19 DIAGNOSIS — I714 Abdominal aortic aneurysm, without rupture, unspecified: Secondary | ICD-10-CM

## 2015-09-21 ENCOUNTER — Other Ambulatory Visit (INDEPENDENT_AMBULATORY_CARE_PROVIDER_SITE_OTHER): Payer: Self-pay | Admitting: Family Medicine

## 2015-09-21 DIAGNOSIS — J449 Chronic obstructive pulmonary disease, unspecified: Secondary | ICD-10-CM

## 2015-09-24 MED ORDER — TIOTROPIUM BROMIDE MONOHYDRATE 18 MCG IN CAPS
18.0000 ug | ORAL_CAPSULE | Freq: Every day | RESPIRATORY_TRACT | 1 refills | Status: DC
Start: 2015-09-24 — End: 2016-03-03

## 2015-09-25 MED ORDER — NITROGLYCERIN 0.4 MG SL SUBL
0.4000 mg | SUBLINGUAL_TABLET | SUBLINGUAL | 3 refills | Status: AC | PRN
Start: 2015-09-25 — End: 2015-09-26

## 2015-10-04 ENCOUNTER — Ambulatory Visit (INDEPENDENT_AMBULATORY_CARE_PROVIDER_SITE_OTHER): Payer: No Typology Code available for payment source | Admitting: Family Medicine

## 2015-10-04 ENCOUNTER — Encounter (INDEPENDENT_AMBULATORY_CARE_PROVIDER_SITE_OTHER): Payer: Self-pay | Admitting: Family Medicine

## 2015-10-04 VITALS — BP 115/65 | HR 60 | Temp 98.1°F | Resp 20 | Wt 219.0 lb

## 2015-10-04 DIAGNOSIS — M7062 Trochanteric bursitis, left hip: Secondary | ICD-10-CM

## 2015-10-04 MED ORDER — HYDROCODONE-ACETAMINOPHEN 5-325 MG OR TABS
1.0000 | ORAL_TABLET | Freq: Four times a day (QID) | ORAL | 0 refills | Status: DC | PRN
Start: 2015-10-04 — End: 2016-01-03

## 2015-10-04 MED ORDER — TRIAMCINOLONE ACETONIDE 40 MG/ML IJ SUSP
40.0000 mg | Freq: Once | INTRAMUSCULAR | Status: AC
Start: 2015-10-04 — End: 2015-10-04
  Administered 2015-10-04: 40 mg via INTRA_ARTICULAR

## 2015-10-04 NOTE — Patient Instructions (Addendum)
Joint Injection and Aspiration    A joint injection may be recommended by your provider to relieve pain and inflammation.  A joint aspiration may be recommended to relieve painful swelling or for diagnostic purposes to analyze the joint fluid.      Your provider will first clean the skin with betadine (or similar bacterial disinfectant) and place a small amount of numbing medicine (such as lidocaine) into the skin.  The numbing medicine may sting or cause mild burning pain as it goes in.  The provider will then perform the joint injection which usually involves a mixture of "cortisone" (or similar corticosteroid) and lidocaine (or similar anesthetic).  Please alert the provider if you are experiencing pain or discomfort during the injection.      Potential benefits of the procedure include decreased pain, reduced inflammation, and improved function.  The risks of the procedure include, but are not limited to, the small risk of bleeding, a reaction to the medicine, allergy, joint or soft tissue infection, skin atrophy or discoloration.

## 2015-10-04 NOTE — Progress Notes (Signed)
CC: Left Hip Pain  HPI: Tracy Conway is a 81 year old male  known to me from a 2012 visit for a shoulder issue, presenting today with a 6 week h/o left lateral hip pain. Onset was acute, brought about by no clear injury. He noted the pain on waking one AM.  Symptoms are described as aching in the posterior hip, lateral buttock. There is no radiation to lower leg or back. Quality of pain is described as aching, and pain is rated as 3-5/10. No Limp. no prior h/o hip complaints. mild associated back pain and no oher associated leg pain. Treatments employed to date include rest and avoiding lying on that side, with no improvement in symptoms. no AM stiffness, no fevers, no chills, no rash.    ROS: As per HPI, no other joint pain, stiffness, fevers, chills, malaise, rash, N/V/D/C, CP, SOB or other complaint on 14 point CPT based review of systems.    Patient Active Problem List   Diagnosis   . HYPERTENSION NOS(aka HTN)   . Pure hypercholesterolemia   . MALIG NEOPLASM DORSAL TONGUE(aka CANCER)   . Chronic Kidney Disease -- Stage III   . Tobacco use disorder   . Unspecified hearing loss   . ECZEMA    . ERECTILE DYSFUNCTION   . Benign neoplasm of colon   . Calculus of kidney   . Panlobular emphysema ([place])   . Thoracic aneurysm without mention of rupture   . AAA (abdominal aortic aneurysm) ([place])   . Ventricular septal defect   . Hypertrophy of prostate without urinary obstruction and other lower urinary tract symptoms (LUTS)   . Generalized osteoarthrosis, unspecified site   . Diverticulitis of colon (without mention of hemorrhage)   . Routine general medical examination at a health care facility   . Lumbago   . Malignant neoplasm of prostate ([place])   . Coronary artery disease involving native coronary artery of native heart with angina pectoris ([place])   . Lumbar stenosis with neurogenic claudication   . Major depressive disorder with single episode, in full remission ([place])   . Alcohol use disorder, moderate, in  sustained remission   . Carotid stenosis   . Falls   . Pneumothorax on right   . Right rib fracture   . Vocal fold atrophy   . ventricular polyps   . Dysphonia       Past Medical History:   Diagnosis Date   . Abdominal aneurysm without mention of rupture ([place]) 12/24/2005    Borderline, seen on CT 8/06.   . Aortic aneurysm of unspecified site without mention of rupture ([facility])     Aortic Aneurysm   . Benign neoplasm of colon 12/24/2005    Tubular adenoma. Dx'ed flex sig 2000--> colonoscopy 2000: N.  F/U 2003: mild diverticulosis, tubular adenoma.  F/U due 2008 with CT colonography.   . Calculus of kidney 12/24/2005    Lithotripsy 1988   . COPD 12/24/2005    emphysema   . Diverticulitis of colon (without mention of hemorrhage) 01/07/2007    Hospitalized at [facility] May 2008   . ECZEMA  12/24/2005    Lower extremities in particular.   Marland Kitchen ERECTILE DYSFUNCTION 12/24/2005   . Generalized osteoarthrosis, unspecified site 01/07/2007    Has seen orthopedics--cortisone shot to L knee helped.   . Hypertrophy of prostate without urinary obstruction and other lower urinary tract symptoms (LUTS) 12/24/2005   . MALIG NEOPLASM DORSAL TONGUE(aka CANCER) 03/29/2002    [place],  T1N1 squamous cell ca L tongue, partial glossectomy; L supraorbital hyoid neck dissection    . Other specified disorders resulting from impaired renal function 07/22/2005    Creat ~ 1.2   . Sleep apnea    . Thoracic aneurysm without mention of rupture ([place]) 12/24/2005    Ascending aortic aneurysm.  Followed by Dr. Ralene Cork, Cardiac surgery.  Gets regular CT scans.  Goal systolic BP: [id number]   . Tobacco use disorder 12/24/2005    Quit 2000.   Marland Kitchen Unspecified disorder of muscle, ligament, and fascia    . Unspecified hearing loss 12/24/2005    Bilat hearing aids.   . Ventricular septal defect 12/24/2005    Small muscular VSD--NEEDS SBE PROPHYLAXIS. (Clindamycin 600 mg prior to dental visits.)       Current Outpatient Prescriptions   Medication Sig Dispense Refill   . Albuterol Sulfate  HFA 108 (90 BASE) MCG/ACT Inhalation Aero Soln Inhale 2 puffs by mouth every 4 hours as needed for shortness of breath/wheezing. For wheezing and/or cough. 1 Inhaler 12   . Aspirin 81 MG Oral Tab 1 tablet by mouth once daily     . Atorvastatin Calcium (LIPITOR) 80 MG Oral Tab Take 1 tablet (80 mg) by mouth daily. 90 tablet 3   . Budesonide-Formoterol Fumarate 160-4.5 MCG/ACT Inhalation Aerosol Inhale 2 puffs by mouth every 12 hours. 3 Inhaler 4   . Cyanocobalamin (B-12) 100 MCG Oral Tab Take 100 mcg by mouth.     . Furosemide 20 MG Oral Tab Take 1 tablet (20 mg) by mouth daily. 90 tablet 0   . Hydrocodone-Acetaminophen 5-325 MG Oral Tab Take 1 tablet by mouth every 6 hours as needed for pain. For pain. Not to exceed 8 tablets per day. 10 tablet 0   . Metoprolol Succinate ER 100 MG Oral TABLET SR 24 HR Take 1 tablet (100 mg) by mouth daily. 90 tablet 1   . Multiple Vitamins-Minerals (MULTIVITAMIN OR) Once daily     . NIFEdipine ER (AFEDITAB CR) 30 MG Oral TABLET SR 24 HR Take 1 tablet (30 mg) by mouth daily. 90 tablet 4   . Nystatin 100000 UNIT/ML Mouth/Throat Suspension Swish and Swallow 5 mL 3 times a day for 14 days. Swish 1/2 dose in each side of mouth for as long as possible before swallowing. 473 mL 0   . Pantoprazole Sodium 40 MG Oral Tab EC TAKE 1 TABLET EVERY MORNING (Patient taking differently: TAKE 1 TABLET EVERY PM) 90 tablet 1   . Polyethylene Glycol 3350 (MIRALAX) Oral Powder Take 17 g by mouth.     . Tamsulosin HCl 0.4 MG Oral Cap Take 1 capsule (0.4 mg) by mouth at bedtime. 90 capsule 3   . Tiotropium Bromide Monohydrate (SPIRIVA HANDIHALER) 18 MCG Inhalation Cap Inhale contents of 1 capsule (18 mcg) by mouth daily. After breathing out, inhale a second time. 90 capsule 1   . Triamcinolone Acetonide 0.1 % External Ointment Apply 1 application topically 2 times a day. Apply to affected areas bid 80 g 1     No current facility-administered medications for this visit.        Review of patient's allergies  indicates:  Allergies   Allergen Reactions   . Penicillin G      hives     . Percocet [Oxycodone-Acetaminophen]      indigestion       Family History   Problem Relation Age of Onset   . Cancer Father  Prostate   . Arthritis Father    . Other Father      Chronic Kidney Disease   . Psych Mother      Alzheimer's Disease   . Cancer Brother        Social History     Social History Narrative    From sept 2011 note from Dr. Claybon Jabs, cardiology:  He has been married 3 times, 25 years to his current wife.  He has 5 daughters living and well although two of his daughters are rather overweight. He is working as a Water quality scientist. He smoked one package of cigarettes a day from the age of 68 years until about 10 years ago, although for the last 5 years of smoking, he was down to about half a package of cigarettes a day.  He is an admitted alcoholic and quit drinking in 1981, at which time he attended [employer] meetings but not any more.        1.24.2012: Married to [employer]. 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 Plans on spending more time woodworking. Goes to gym--exercises 20-30 mins every other day. Ct    -- Considering working for [employer]       PE: BP 115/65   Pulse 60   Temp 98.1 F (36.7 C) (Temporal)   Resp 20   Wt 219 lb (99.3 kg)   SpO2 97% Comment: On 1 liter of oxygen  BMI 30.54 kg/m   GEN: NAD, cooperative with exam  GAIT: nonantalgic  HEENT: NCAT, EOMI  Back: Slump test negative  Hip exam  -- Gait: Nonantalgic, no gluteus medius lurch  -- Inspection: no deformity, no atrophy, no asymmetry, no pelvic obliquity  -- Strength: 5/5 with hip flexion (knee extended), 5/5 with hip flexion (knee straight), 5/5 with hip adduction, 5/5 with hip abduction (lateral decub position), 5/5 with hip ER (lateral decub position), 5/5 with hip IR  -- Palp: TTP over posterior greater trochanter, which reproduces the pain  -- Neurovascular: Distal pulses palpable. Sensation  intact        Injection of Left hip    Informed Consent:    The risks and benefits of the procedure were discussed.  Potential benefits include decreased pain, reduced inflammation, and improved function.  The risks include the small risk of bleeding, a reaction to the medicine, allergy, infection, skin atrophy or hypopigmentation.  The patient was given an opportunity to ask questions.  Both verbal and written consent was obtained before the procedure.      Procedure:    Final verification of correct patient identity, procedure, site/side and instrument was performed prior to start of procedure: YES    Greater Trochanteric Bursa Injection:  The patient was in the side-lying position.  The point of maximum tenderness was identified.  The skin was prepped with Betadine and alcohol, and a small amount of 1% lidocaine infiltrated locally for skin anesthesia using a 30G needle.  Using a 22G needle, a mixture of 3 cc of 1% lidocaine and 1 cc of TRIAMCINOLONE ACETONIDE 40 MG/ML SUSP (KENALOG) was injected.  A sterile bandage was applied.  The patient tolerated the procedure well.    A/P (M70.62) Trochanteric bursitis, left hip  (primary encounter diagnosis)  Plan: Hydrocodone-Acetaminophen 5-325 MG Oral Tab,         ARTHROCENTESIS ASPIR&/INJ MAJOR JT/BURSA W/O         [place], triamcinolone acetonide (Kenalog-40) 40  mg/mL injection  1. Discussed the benefit he had from prior cortisone injection to his right shoulder, so given his bursitis-type symptoms, we proceeded with a trochanteric injection today  2. F/u as needed, should symptoms persist  3. Consider PT if he has only partial resolution    I spent 20 minutes face-to-face with the patient exlcuding procedure time, with more than 50% was spent counseling and coordinating the pateint's care as outlined above.      Eugenia Mcalpine, MD  Associate Professor  Department of [place] [facility] [place]

## 2015-10-05 ENCOUNTER — Other Ambulatory Visit (INDEPENDENT_AMBULATORY_CARE_PROVIDER_SITE_OTHER): Payer: Self-pay | Admitting: Family Medicine

## 2015-10-05 DIAGNOSIS — J449 Chronic obstructive pulmonary disease, unspecified: Secondary | ICD-10-CM

## 2015-10-05 DIAGNOSIS — N401 Enlarged prostate with lower urinary tract symptoms: Secondary | ICD-10-CM

## 2015-10-07 MED ORDER — SYMBICORT 160-4.5 MCG/ACT IN AERO
INHALATION_SPRAY | RESPIRATORY_TRACT | 2 refills | Status: AC
Start: 2015-10-07 — End: ?

## 2015-10-07 MED ORDER — TAMSULOSIN HCL 0.4 MG OR CAPS
ORAL_CAPSULE | ORAL | 2 refills | Status: DC
Start: 2015-10-07 — End: 2016-05-26

## 2015-10-21 ENCOUNTER — Other Ambulatory Visit (INDEPENDENT_AMBULATORY_CARE_PROVIDER_SITE_OTHER): Payer: Self-pay | Admitting: Family Medicine

## 2015-10-21 DIAGNOSIS — I10 Essential (primary) hypertension: Secondary | ICD-10-CM

## 2015-10-22 MED ORDER — NIFEDIPINE ER 30 MG OR TB24
30.0000 mg | EXTENDED_RELEASE_TABLET | Freq: Every day | ORAL | 0 refills | Status: DC
Start: 2015-10-22 — End: 2016-01-21

## 2015-11-02 ENCOUNTER — Other Ambulatory Visit (HOSPITAL_BASED_OUTPATIENT_CLINIC_OR_DEPARTMENT_OTHER): Payer: Self-pay | Admitting: Cardiovascular Disease

## 2015-11-02 DIAGNOSIS — R0600 Dyspnea, unspecified: Secondary | ICD-10-CM

## 2015-11-02 DIAGNOSIS — R0689 Other abnormalities of breathing: Secondary | ICD-10-CM

## 2015-11-04 MED ORDER — FUROSEMIDE 20 MG OR TABS
20.0000 mg | ORAL_TABLET | Freq: Every day | ORAL | 3 refills | Status: DC
Start: 2015-11-04 — End: 2016-05-26

## 2015-11-04 NOTE — Telephone Encounter (Signed)
From: Zoe Lan  Sent: 11/02/2015 9:50 AM PDT  Subject: Medication Renewal Request    Bill L. Cassel would like a refill of the following medications:  Furosemide 20 MG Oral Tab Jarrett Ables, MD]    Preferred pharmacy: [facility] [phone]     Comment:  Need refills sent to [facility].

## 2015-12-25 ENCOUNTER — Encounter (HOSPITAL_BASED_OUTPATIENT_CLINIC_OR_DEPARTMENT_OTHER): Payer: Self-pay

## 2016-01-01 ENCOUNTER — Ambulatory Visit: Payer: No Typology Code available for payment source | Attending: Pulmonary Disease | Admitting: Pulmonary Disease

## 2016-01-01 ENCOUNTER — Encounter (HOSPITAL_BASED_OUTPATIENT_CLINIC_OR_DEPARTMENT_OTHER): Payer: Self-pay | Admitting: Pulmonary Disease

## 2016-01-01 VITALS — BP 104/60 | HR 76 | Temp 97.0°F | Ht 71.0 in | Wt 218.5 lb

## 2016-01-01 DIAGNOSIS — Z6830 Body mass index (BMI) 30.0-30.9, adult: Secondary | ICD-10-CM

## 2016-01-01 DIAGNOSIS — J449 Chronic obstructive pulmonary disease, unspecified: Secondary | ICD-10-CM | POA: Insufficient documentation

## 2016-01-01 NOTE — Progress Notes (Signed)
ID: Tracy Conway  is here for follow-up of  COPD    Billing Data: I spent a total of 25 minutes face to face over half of which was in direct counseling.     CC:     Dyspnea.    PL:   Patient Active Problem List    Diagnosis Date Noted   . 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 ventricular polyps [J38.7] 10/16/2014   . Dysphonia [R49.0] 10/16/2014   . Pneumothorax on right [J93.9] 01/22/2014   . Right rib fracture [S22.31XA] 01/22/2014   . Falls [id number]] 10/26/2013     Inpatient evaluation September 2014, felt to have micturition syncope.  7/15 -- orthostatic hypotension sxs, BPs in afternoon in mid to high 90s to low [id number] systolic, 123456 to 123456 diastolic. D/Ced Nifedipine.     . Alcohol use disorder, moderate, in sustained remission [F10.21]      Had been in remission for many years but recently relapsed. Goes to [place] very regularly       . Carotid stenosis [I65.29]             . Major depressive disorder with single episode, in full remission ([place]) [F32.5] 11/10/2012   . Lumbar stenosis with neurogenic claudication [M48.06] 02/05/2012     March 21, 2012, underwent L4-L5 bilateral laminectomies and partial medial facetectomies, as well as an L4-L5 right-sided synovial cyst resection. He says since surgery he feels better       . Coronary artery disease involving native coronary artery of native heart with angina pectoris ([place]) [I25.119] 12/30/2010     Non-ST elevation myocardial infarction., July 18,2012  LAD drug-eluting stent nov, 2012. Clopidogrel         . Malignant neoplasm of prostate ([place]) [C61] 08/14/2010     Diagnosed 07/2010. Sees [name] (urol) and [name] (rad onc). Saw [name], [name]--[facility] [place]--palladium seed implants June 2012  Tracy Conway has an intermediate risk prostate cancer on the basis of a Gleason 7 histology       . Lumbago [M54.5] 12/19/2009     March 21, 2012, underwent L4-L5 bilateral laminectomies and partial medial facetectomies, as well as an L4-L5  right-sided synovial cyst resection. He says since surgery he feels better       . Routine general medical examination at a health care facility [Z00.00] 05/15/2009     06/07/2012  Td/Tdap (q 10 yr):  2004, 2012  Pneumococcal (65 yoa and high risk x1):  2010, 2004  Influenza vaccine (q 1 yr):  2010 nov, 2011, 2012, 2013  Shingles: discussed--should get at his local pharmacy  Blood pressure:  Excellent 138/64  121/72  Cholesterol (q 5 yr begin age 3):on simvastatin oct 2012: excellent--will repeat  AAA screen (x1, male age 41, if ever smoked):  Checked at [facility]-- abdo and thoracic CT--aug 2012: stable ascending aortic aneurysm  Colorectal cancer: ct colonoscopy done 2008, repeat due 2013--done April 2013: neg   PSA:  Sees urology for fu prostate ca. Has had palladium seed implants  Tobacco:  NS x 11 yrs, 50 pack yr hx  Exercise:  Reg--treadmill x 20-30 mins then weight machines--every other day! Has not been back to the gym because of the back pain  Alcohol /Drugs:  None in 26 yrs--relapse jan 2014--did inpatient treatment--now none in 37 days!  Sexual practice/contraception:  Not an issue  Advanced directives:  Does have living will. DPOA  done       .  Generalized osteoarthrosis, unspecified site [M15.9] 01/07/2007     Has seen orthopedics--cortisone shot to L knee helped.  L knee replacement 2008     . Diverticulitis of colon (without mention of hemorrhage) [K57.32] 01/07/2007     Hospitalized at [facility] May 2008     . Tobacco use disorder [F17.200] 12/24/2005     Quit 2000.  50 pack year history  EtOH--quit 1986.     Marland Kitchen Unspecified hearing loss [H91.90] 12/24/2005     Bilat hearing aids.     . ECZEMA  [L25.9] 12/24/2005     Lower extremities in particular.     Marland Kitchen ERECTILE DYSFUNCTION [N52.9] 12/24/2005   . Benign neoplasm of colon [D12.6] 12/24/2005     Tubular adenoma. Dx'ed flex sig 2000--> colonoscopy 2000: N.  F/U 2003: mild diverticulosis, tubular adenoma.  F/U due 2008 with CT colonography.--done 7/08: ess. N.   Next due 2013       . Calculus of kidney [N20.0] 12/24/2005     Lithotripsy 1988  Stone panel in 0ct 09: high oxalate. Given low oxalae diet by Dr. Princella [name], nephrology in 2009.       Marland Kitchen Panlobular emphysema ([place]) [J43.1] 12/24/2005     05/2013: started by pulm on home O2  Emphysema: an FEV1 which is approximately at 33% of predicted    04/2010--Pulm: Chronic obstructive pulmonary disease, severe to very severe. I explained to him that continuing on the Spiriva as well as the Serevent was likely good therapy.I further told him that if he should have recurrent exacerbations within the course of the year that I would consider adding an inhaled corticosteroid at that point in time. He does not need oxygen at this point in time. He remains a nonsmoker. He is already engaged in a pulmonary rehabilitation.    Today I described to him how overall lung volume reduction surgery had no significant benefit on mortality. Subgroup analysis did indicate that those patients, who had upper lobe predominant disease with low exercise capacity were the most likely to benefit. Given the fact that he actually still has reasonably good functional status and is getting around and going to the gym and working out, it is not clear that he would be in the group that would most likely benefit, although, to safe for certain, we need to get a cardiopulmonary exercise test. At this point in time, he is feeling good with his chronic obstructive pulmonary disease therapy and is not interested in pursuing lung volume reduction surgery.    05/20/2010 CT angio: Addendum: Increased scarring in the right lung base since the prior   exam including the interval development of a new nodule. The   appearance suggests that the patient has had a recent infection of   the right lower lobe has resolved and since the prior scan. However   recommend 3 months followup of the right lower lobe lesion given that   the patient is high risk with extensive paraseptal  emphysema.     08/03/11 CT Chest  1. New indeterminate nodules in the lower lobes bilaterally, the largest   nodule measures 10 x 12 mm in the paraspinal location in the right lower lobe.  Possible considerations include infectious/inflammatory and less likely   metastasis.   2. Bilateral upper lobe predominant moderate centrilobular and paraseptal   emphysema.   3. Unchanged dilated main pulmonary artery measuring 32 mm may indicate   pulmonary arterial hypertension.   4. Aortic aneurysm involving the ascending aorta  at the level of the main   pulmonary artery measuring 47 mm.     10/2011:   Impression:  1. Previous opacities resolved. New focus of small airways inflammation in the  RUL. No worrisome nodule or opacity for malignancy. If followup of the new   airways inflammation is desired a 12 month study will be adequate.  2. Stable fibrosis and peripheral calcification in the RLL, which raises the   possibility of chronic aspiration.                  . Thoracic aneurysm without mention of rupture [I71.2] 12/24/2005     Ascending aortic aneurysm.  Followed by Dr. Ralene Cork, Cardiac surgery.  Gets regular CT scans.  Goal systolic BP: [id number]  Also has AAA     . AAA (abdominal aortic aneurysm) ([place]) [I71.4] 12/24/2005     Father also had AAA. Borderline, seen on CT 8/06.  Also has thoracic aneurysm     . Ventricular septal defect [Q21.0] 12/24/2005     Small muscular VSD--NEEDS SBE PROPHYLAXIS. (Clindamycin 600 mg prior to dental visits.)     . Hypertrophy of prostate without urinary obstruction and other lower urinary tract symptoms (LUTS) [N40.0] 12/24/2005   . Chronic Kidney Disease -- Stage III [N25.89] 07/22/2005     Creat ~ 1.8. Normal albuminuria (8/08)  Likely 2/2 HTN, poss component from hx obstructing nephrolithiasis.  8/08 Renal: referred to Uro for 10mm stone, nonobstructing. Also doing w/u for stone type, poss target prevention.     Marland Kitchen HYPERTENSION NOS(aka HTN) [I10] 03/29/2002   . Pure hypercholesterolemia  [E78.00] 03/29/2002   . MALIG NEOPLASM DORSAL TONGUE(aka CANCER) [C02.0] 03/29/2002     [place], T1N1 squamous cell ca L tongue, partial glossectomy; L supraorbital hyoid neck dissection           MEDS:    Outpatient Medications Prior to Visit   Medication Sig Dispense Refill   . Albuterol Sulfate HFA 108 (90 BASE) MCG/ACT Inhalation Aero Soln Inhale 2 puffs by mouth every 4 hours as needed for shortness of breath/wheezing. For wheezing and/or cough. 1 Inhaler 12   . Aspirin 81 MG Oral Tab 1 tablet by mouth once daily     . Atorvastatin Calcium (LIPITOR) 80 MG Oral Tab Take 1 tablet (80 mg) by mouth daily. 90 tablet 3   . Cyanocobalamin (B-12) 100 MCG Oral Tab Take 100 mcg by mouth.     . Furosemide 20 MG Oral Tab Take 1 tablet (20 mg) by mouth daily. 90 tablet 3   . Hydrocodone-Acetaminophen 5-325 MG Oral Tab Take 1 tablet by mouth every 6 hours as needed for pain. For pain. Not to exceed 8 tablets per day. 10 tablet 0   . Metoprolol Succinate ER 100 MG Oral TABLET SR 24 HR Take 1 tablet (100 mg) by mouth daily. 90 tablet 1   . Multiple Vitamins-Minerals (MULTIVITAMIN OR) Once daily     . NIFEdipine ER 30 MG Oral TABLET SR 24 HR Take 1 tablet (30 mg) by mouth daily. 90 tablet 0   . Nystatin 100000 UNIT/ML Mouth/Throat Suspension Swish and Swallow 5 mL 3 times a day for 14 days. Swish 1/2 dose in each side of mouth for as long as possible before swallowing. 473 mL 0   . Pantoprazole Sodium 40 MG Oral Tab EC TAKE 1 TABLET EVERY MORNING (Patient taking differently: TAKE 1 TABLET EVERY PM) 90 tablet 1   . Polyethylene Glycol 3350 (MIRALAX) Oral  Powder Take 17 g by mouth.     . SYMBICORT 160-4.5 MCG/ACT Inhalation Aerosol USE 2 INHALATIONS EVERY 12 HOURS 30.6 g 2   . Tamsulosin HCl 0.4 MG Oral Cap TAKE 1 CAPSULE AT BEDTIME 90 capsule 2   . Tiotropium Bromide Monohydrate (SPIRIVA HANDIHALER) 18 MCG Inhalation Cap Inhale contents of 1 capsule (18 mcg) by mouth daily. After breathing out, inhale a second time. 90  capsule 1   . Triamcinolone Acetonide 0.1 % External Ointment Apply 1 application topically 2 times a day. Apply to affected areas bid 80 g 1     No facility-administered medications prior to visit.        INTERIM HX:     Tracy Conway has been doing well. No flare ups of disease.  He continues to use all medications as prescribed.  He has had pneumonia shot already.     PE:     BP 104/60   Pulse 76   Temp 97 F (36.1 C) (Temporal)   Ht 5\' 11"  (1.803 m)   Wt 218 lb 7.6 oz (99.1 kg)   SpO2 94% Comment: 2 liter  BMI 30.47 kg/m   GEN: Looks well.  Wearing oxygen.  HEENT: wnl  Lungs:markedly diminished BS in all lung    Heart: No obvious JVD.  Heart sounds distant.  S1 and S2 without S3 or M  Abd: +BS, no masses.    Ext: warm and without edema.    LAB:    None today.     Assessment/Plan:     PROBLEM #1: COPD    A: Tracy Conway is stable in terms of COPD.  He has not had a change in sx and no change in medications.  Today,  He inquired about lung transplantation.  Unfortunately,  He is above the age limit for this procedure.  Overall he is doing well.  No changes at this time.        P:   1. Continue current meds.  2. Flu shot in October.    3. Follow-up in 6 months with PFTs.

## 2016-01-03 ENCOUNTER — Ambulatory Visit (INDEPENDENT_AMBULATORY_CARE_PROVIDER_SITE_OTHER): Payer: No Typology Code available for payment source | Admitting: Family Medicine

## 2016-01-03 ENCOUNTER — Encounter (INDEPENDENT_AMBULATORY_CARE_PROVIDER_SITE_OTHER): Payer: Self-pay | Admitting: Family Medicine

## 2016-01-03 VITALS — BP 132/67 | HR 72 | Temp 97.5°F | Resp 12 | Wt 219.0 lb

## 2016-01-03 DIAGNOSIS — Z6830 Body mass index (BMI) 30.0-30.9, adult: Secondary | ICD-10-CM

## 2016-01-03 DIAGNOSIS — M25552 Pain in left hip: Secondary | ICD-10-CM

## 2016-01-03 DIAGNOSIS — M67952 Unspecified disorder of synovium and tendon, left thigh: Secondary | ICD-10-CM

## 2016-01-03 MED ORDER — ACETAMINOPHEN 500 MG OR TABS
ORAL_TABLET | ORAL | 2 refills | Status: AC
Start: 2016-01-03 — End: ?

## 2016-01-03 NOTE — Progress Notes (Signed)
[facility] OUTPATIENT CL[facility] NOTE    ID and Chief Complaint   Tracy Conway is a 81 year old male who comes to clinic today primarily to discuss left hip pain (1).    History of Present Complaint(s)   1. Hip pain  Tracy Conway describes about 5 months of posterolateral hip pain. He localizes it to the back of his hip with occasional radiation down the back of his leg but not below his knee. He denies any back pain, numbness, or tingling. He cannot clearly pinpoint movements that make it worse. It gets better throughout the day. It is worst in the morning. He is taking intermittent acetaminophen for his pain without much relief. The pain is described as a dull ache with occasional sharp radiation down his leg. There was no traumatic event that he can think of. He did have a lateral hip injection in June that provided a few weeks of relief, but it came right back. He hasn't done physical therapy.    Of note, he has emphysema that limits his exercise tolerance significantly.    Review of Systems   CONSTITUTIONAL: Denies fevers, chills, night sweats  NEUROLOGIC: As above  RHEUMATOLOGIC/MSK: As per HPI    Patient Active Problem List   Diagnosis   . HYPERTENSION NOS(aka HTN)   . Pure hypercholesterolemia   . MALIG NEOPLASM DORSAL TONGUE(aka CANCER)   . Chronic Kidney Disease -- Stage III   . Tobacco use disorder   . Unspecified hearing loss   . ECZEMA    . ERECTILE DYSFUNCTION   . Benign neoplasm of colon   . Calculus of kidney   . Panlobular emphysema ([place])   . Thoracic aneurysm without mention of rupture   . AAA (abdominal aortic aneurysm) ([place])   . Ventricular septal defect   . Hypertrophy of prostate without urinary obstruction and other lower urinary tract symptoms (LUTS)   . Generalized osteoarthrosis, unspecified site   . Diverticulitis of colon (without mention of hemorrhage)   . Routine general medical examination at a health care facility   . Lumbago   . Malignant neoplasm of prostate ([place])   . Coronary  artery disease involving native coronary artery of native heart with angina pectoris ([place])   . Lumbar stenosis with neurogenic claudication   . Major depressive disorder with single episode, in full remission ([place])   . Alcohol use disorder, moderate, in sustained remission   . Carotid stenosis   . Falls   . Pneumothorax on right   . Right rib fracture   . Vocal fold atrophy   . ventricular polyps   . Dysphonia     Current Outpatient Prescriptions   Medication Sig Dispense Refill   . Acetaminophen 500 MG Oral Tab Take 1-2 tablets three times daily for left hip pain. Do not exceed 3000 mg of acetaminophen in a day. 100 tablet 2   . Albuterol Sulfate HFA 108 (90 BASE) MCG/ACT Inhalation Aero Soln Inhale 2 puffs by mouth every 4 hours as needed for shortness of breath/wheezing. For wheezing and/or cough. 1 Inhaler 12   . Aspirin 81 MG Oral Tab 1 tablet by mouth once daily     . Atorvastatin Calcium (LIPITOR) 80 MG Oral Tab Take 1 tablet (80 mg) by mouth daily. 90 tablet 3   . Cyanocobalamin (B-12) 100 MCG Oral Tab Take 100 mcg by mouth.     . Furosemide 20 MG Oral Tab Take 1 tablet (20 mg) by mouth daily.  90 tablet 3   . Metoprolol Succinate ER 100 MG Oral TABLET SR 24 HR Take 1 tablet (100 mg) by mouth daily. 90 tablet 1   . Multiple Vitamins-Minerals (MULTIVITAMIN OR) Once daily     . NIFEdipine ER 30 MG Oral TABLET SR 24 HR Take 1 tablet (30 mg) by mouth daily. 90 tablet 0   . Nystatin 100000 UNIT/ML Mouth/Throat Suspension Swish and Swallow 5 mL 3 times a day for 14 days. Swish 1/2 dose in each side of mouth for as long as possible before swallowing. 473 mL 0   . Pantoprazole Sodium 40 MG Oral Tab EC TAKE 1 TABLET EVERY MORNING (Patient taking differently: TAKE 1 TABLET EVERY PM) 90 tablet 1   . Polyethylene Glycol 3350 (MIRALAX) Oral Powder Take 17 g by mouth.     . SYMBICORT 160-4.5 MCG/ACT Inhalation Aerosol USE 2 INHALATIONS EVERY 12 HOURS 30.6 g 2   . Tamsulosin HCl 0.4 MG Oral Cap TAKE 1 CAPSULE AT BEDTIME 90  capsule 2   . Tiotropium Bromide Monohydrate (SPIRIVA HANDIHALER) 18 MCG Inhalation Cap Inhale contents of 1 capsule (18 mcg) by mouth daily. After breathing out, inhale a second time. 90 capsule 1   . Triamcinolone Acetonide 0.1 % External Ointment Apply 1 application topically 2 times a day. Apply to affected areas bid 80 g 1     No current facility-administered medications for this visit.      Review of patient's allergies indicates:  Allergies   Allergen Reactions   . Penicillin G      hives     . Percocet [Oxycodone-Acetaminophen]      indigestion       Physical Exam   BP 132/67   Pulse 72   Temp 97.5 F (36.4 C) (Temporal)   Resp 12   Wt 219 lb (99.3 kg)   SpO2 96% Comment: 2 liters   BMI 30.54 kg/m   GENERAL: No distress. Pleasant and cooperative with exam.    MUSCULOSKELETAL EXAM (Focus on left hip)  Visualization:  -- Mildly antalgic gait  Palpation:  -- No tenderness to palpation of the ASIS, AIIS, iliopsoas  -- Moderate to severe tenderness to palpation of the gluteus medius, greater trochanter, and piriformis  Range of motion:  -- Flexion with straight knee to 60 degrees  -- Flexion with bent knee to 120 degrees  -- Internal rotation to 30 degrees without pain  -- External rotation to 45 degrees without pain  Strength:  -- 4+/5 flexion with straight knee without pain  -- 4+/5 flexion with bent knee without pain  -- 4+/5 adduction without pain  -- 4+/5 abduction with mild pain elicited  -- 4+/5 internal rotation without pain  -- 4+/5 external rotation with moderate pain elicited  Special tests:  -- FABER test negative  Neurovascular:  -- Straight leg test negative    Labs/X-ray/Other   X-rays today with multifocal degenerative changes on my interpretation; please see final radiology interpretation    _____________________________________________________________________________    Assessment/Plan   Tracy Conway is a 81 year old male presenting with left hip pain and a history and physical  exam suggestive of a greater trochanter pain syndrome, likely gluteal tendinopathy, with possibly contribution from a tight piriformis. I would like to work on both of these with physical therapy before proceeding with another injection, so I provided him a referral today. If he is not improving, I advised he let me know, and I think I would  be inclined to proceed with a piriformis injection next. In the meantime, after carefully reviewing maximum dosing and the fact that other medications have acetaminophen, we agreed to do 1-2 500 mg tablets of acetaminophen 3 times daily for better pain control.  -- Acetaminophen as above  -- Refer to PT  -- Follow up as needed    The patient agreed with the above plan and was given an opportunity to ask questions before they left clinic. I answered these questions to the best of my ability.    Next Steps (if not improving)   Consider injection, piriformis versus gluteus medius    --  [name] Amie Critchley, MD  Assistant Professor  [place] [facility] of [facility] Medicine  Sports Medicine, Residency sections

## 2016-01-06 ENCOUNTER — Other Ambulatory Visit (INDEPENDENT_AMBULATORY_CARE_PROVIDER_SITE_OTHER): Payer: Self-pay | Admitting: Cardiovascular Disease

## 2016-01-06 ENCOUNTER — Ambulatory Visit: Payer: No Typology Code available for payment source | Attending: Cardiovascular Disease

## 2016-01-06 ENCOUNTER — Ambulatory Visit (HOSPITAL_BASED_OUTPATIENT_CLINIC_OR_DEPARTMENT_OTHER): Payer: No Typology Code available for payment source

## 2016-01-06 DIAGNOSIS — I25758 Atherosclerosis of native coronary artery of transplanted heart with other forms of angina pectoris: Secondary | ICD-10-CM

## 2016-01-06 DIAGNOSIS — I714 Abdominal aortic aneurysm, without rupture: Secondary | ICD-10-CM | POA: Insufficient documentation

## 2016-01-06 LAB — LIPID PANEL
Cholesterol (LDL): 81 mg/dL (ref <130)
Cholesterol/HDL Ratio: 2.2
HDL Cholesterol: 76 mg/dL (ref >39)
Non-HDL Cholesterol: 94 mg/dL (ref 0–159)
Total Cholesterol: 170 mg/dL (ref <200)
Triglyceride: 67 mg/dL (ref <150)

## 2016-01-06 LAB — CREATININE, POC: Creatinine, POC: 1.6 mg/dL — ABNORMAL HIGH (ref 0.51–1.18)

## 2016-01-06 LAB — BUN, POC: BUN, POC: 23 mg/dL — ABNORMAL HIGH (ref 8–21)

## 2016-01-15 NOTE — Progress Notes (Signed)
Cardiovascular Clinical Evaluation Note    Primary Care Provider: Levora Angel, MD    DOB:  October 01, 1934    CHIEF COMPLAINT: CT f/u    HISTORY OF PRESENT ILLNESS:    Has undergone a CT w/o contrast to follow modestly dilated aorta.  This is summarized in the problem list and is stable at 4.5 cm.  Labs are reviewed after an increase in atorvastatin dose.  The results of the CT were discussed.  He has just seen his pulmonologist as well.  He reports no new symptoms of angina or equivalent. No palpitations.  States that other than his dyspnea, he feels pretty good.  Has no side effects from the increased dose of atorastatin.    PROBLEM LIST:  Patient Active Problem List    Diagnosis Date Noted   . 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 ventricular polyps 10/16/2014   . Dysphonia 10/16/2014   . Pneumothorax on right 01/22/2014   . Right rib fracture 01/22/2014   . Falls 10/26/2013     Inpatient evaluation September 2014, felt to have micturition syncope.  7/15 -- orthostatic hypotension sxs, BPs in afternoon in mid to high 90s to low [id number] systolic, 123456 to 123456 diastolic. D/Ced Nifedipine.     . Alcohol use disorder, moderate, in sustained remission      Had been in remission for many years but recently relapsed. Goes to [place] very regularly       . Carotid stenosis             . Major depressive disorder with single episode, in full remission ([place]) 11/10/2012   . Lumbar stenosis with neurogenic claudication 02/05/2012     March 21, 2012, underwent L4-L5 bilateral laminectomies and partial medial facetectomies, as well as an L4-L5 right-sided synovial cyst resection. He says since surgery he feels better       . Coronary artery disease involving native coronary artery of native heart with angina pectoris ([place]) 12/30/2010     Non-ST elevation myocardial infarction., July 18,2012  LAD drug-eluting stent nov, 2012. Clopidogrel         . Malignant neoplasm of prostate ([place]) 08/14/2010     Diagnosed 07/2010. Sees [name]  (urol) and [name] (rad onc). Saw [name], [name]--[facility]--palladium seed implants June 2012  Tracy Conway has an intermediate risk prostate cancer on the basis of a Gleason 7 histology       . Lumbago 12/19/2009     March 21, 2012, underwent L4-L5 bilateral laminectomies and partial medial facetectomies, as well as an L4-L5 right-sided synovial cyst resection. He says since surgery he feels better       . Routine general medical examination at a health care facility 05/15/2009     06/07/2012  Td/Tdap (q 10 yr):  2004, 2012  Pneumococcal (65 yoa and high risk x1):  2010, 2004  Influenza vaccine (q 1 yr):  2010 nov, 2011, 2012, 2013  Shingles: discussed--should get at his local pharmacy  Blood pressure:  Excellent 138/64  121/72  Cholesterol (q 5 yr begin age 89):on simvastatin oct 2012: excellent--will repeat  AAA screen (x1, male age 19, if ever smoked):  Checked at [place]-- abdo and thoracic CT--aug 2012: stable ascending aortic aneurysm  Colorectal cancer: ct colonoscopy done 2008, repeat due 2013--done April 2013: neg   PSA:  Sees urology for fu prostate ca. Has had palladium seed implants  Tobacco:  NS x 11 yrs, 50 pack  yr hx  Exercise:  Reg--treadmill x 20-30 mins then weight machines--every other day! Has not been back to the gym because of the back pain  Alcohol /Drugs:  None in 26 yrs--relapse jan 2014--did inpatient treatment--now none in 37 days!  Sexual practice/contraception:  Not an issue  Advanced directives:  Does have living will. DPOA  done       . Generalized osteoarthrosis, unspecified site 01/07/2007     Has seen orthopedics--cortisone shot to L knee helped.  L knee replacement 2008     . Diverticulitis of colon (without mention of hemorrhage) 01/07/2007     Hospitalized at [facility] May 2008     . Tobacco use disorder 12/24/2005     Quit 2000.  50 pack year history  EtOH--quit 1986.     Marland Kitchen Unspecified hearing loss 12/24/2005     Bilat hearing aids.     . ECZEMA   12/24/2005     Lower extremities in particular.     Marland Kitchen ERECTILE DYSFUNCTION 12/24/2005   . Benign neoplasm of colon 12/24/2005     Tubular adenoma. Dx'ed flex sig 2000--> colonoscopy 2000: N.  F/U 2003: mild diverticulosis, tubular adenoma.  F/U due 2008 with CT colonography.--done 7/08: ess. N.  Next due 2013       . Calculus of kidney 12/24/2005     Lithotripsy 1988  Stone panel in 0ct 09: high oxalate. Given low oxalae diet by Dr. Princella [name], nephrology in 2009.       Marland Kitchen Panlobular emphysema ([place]) 12/24/2005     05/2013: started by pulm on home O2  Emphysema: an FEV1 which is approximately at 33% of predicted    04/2010--Pulm: Chronic obstructive pulmonary disease, severe to very severe. I explained to him that continuing on the Spiriva as well as the Serevent was likely good therapy.I further told him that if he should have recurrent exacerbations within the course of the year that I would consider adding an inhaled corticosteroid at that point in time. He does not need oxygen at this point in time. He remains a nonsmoker. He is already engaged in a pulmonary rehabilitation.    Today I described to him how overall lung volume reduction surgery had no significant benefit on mortality. Subgroup analysis did indicate that those patients, who had upper lobe predominant disease with low exercise capacity were the most likely to benefit. Given the fact that he actually still has reasonably good functional status and is getting around and going to the gym and working out, it is not clear that he would be in the group that would most likely benefit, although, to safe for certain, we need to get a cardiopulmonary exercise test. At this point in time, he is feeling good with his chronic obstructive pulmonary disease therapy and is not interested in pursuing lung volume reduction surgery.    05/20/2010 CT angio: Addendum: Increased scarring in the right lung base since the prior   exam including the interval development of a new  nodule. The   appearance suggests that the patient has had a recent infection of   the right lower lobe has resolved and since the prior scan. However   recommend 3 months followup of the right lower lobe lesion given that   the patient is high risk with extensive paraseptal emphysema.     08/03/11 CT Chest  1. New indeterminate nodules in the lower lobes bilaterally, the largest   nodule measures 10 x 12 mm in the paraspinal  location in the right lower lobe.  Possible considerations include infectious/inflammatory and less likely   metastasis.   2. Bilateral upper lobe predominant moderate centrilobular and paraseptal   emphysema.   3. Unchanged dilated main pulmonary artery measuring 32 mm may indicate   pulmonary arterial hypertension.   4. Aortic aneurysm involving the ascending aorta at the level of the main   pulmonary artery measuring 47 mm.     10/2011:   Impression:  1. Previous opacities resolved. New focus of small airways inflammation in the  RUL. No worrisome nodule or opacity for malignancy. If followup of the new   airways inflammation is desired a 12 month study will be adequate.  2. Stable fibrosis and peripheral calcification in the RLL, which raises the   possibility of chronic aspiration.                  . Thoracic aneurysm without mention of rupture 12/24/2005     A. CT 12/2014:   Aneurysmal dilatation of the ascending aorta is again identified measuring up   to 4.6 cm at the level of the main pulmonary artery, previously 4.7 cm in   2013.  B.  CTA 01/06/16:  Ascending aortic dimension 4.5 cm.        Marland Kitchen AAA (abdominal aortic aneurysm) ([place]) 12/24/2005     Father also had AAA. Borderline, seen on CT 8/06.  Also has thoracic aneurysm     . Ventricular septal defect 12/24/2005     Small muscular VSD--NEEDS SBE PROPHYLAXIS. (Clindamycin 600 mg prior to dental visits.)     . Hypertrophy of prostate without urinary obstruction and other lower urinary tract symptoms (LUTS) 12/24/2005   . Chronic Kidney  Disease -- Stage III 07/22/2005     Creat ~ 1.8. Normal albuminuria (8/08)  Likely 2/2 HTN, poss component from hx obstructing nephrolithiasis.  8/08 Renal: referred to Uro for 61mm stone, nonobstructing. Also doing w/u for stone type, poss target prevention.     Marland Kitchen HYPERTENSION NOS(aka HTN) 03/29/2002   . Pure hypercholesterolemia 03/29/2002   . MALIG NEOPLASM DORSAL TONGUE(aka CANCER) 03/29/2002     Ssurgery 1994, T1N1 squamous cell ca L tongue, partial glossectomy; L supraorbital hyoid neck dissection           MEDICATIONS:    Current Outpatient Prescriptions   Medication Sig Dispense Refill   . Acetaminophen 500 MG Oral Tab Take 1-2 tablets three times daily for left hip pain. Do not exceed 3000 mg of acetaminophen in a day. 100 tablet 2   . Albuterol Sulfate HFA 108 (90 BASE) MCG/ACT Inhalation Aero Soln Inhale 2 puffs by mouth every 4 hours as needed for shortness of breath/wheezing. For wheezing and/or cough. 1 Inhaler 12   . Aspirin 81 MG Oral Tab 1 tablet by mouth once daily     . Atorvastatin Calcium (LIPITOR) 80 MG Oral Tab Take 1 tablet (80 mg) by mouth daily. 90 tablet 3   . Cyanocobalamin (B-12) 100 MCG Oral Tab Take 100 mcg by mouth.     . Furosemide 20 MG Oral Tab Take 1 tablet (20 mg) by mouth daily. 90 tablet 3   . Metoprolol Succinate ER 100 MG Oral TABLET SR 24 HR Take 1 tablet (100 mg) by mouth daily. 90 tablet 1   . Multiple Vitamins-Minerals (MULTIVITAMIN OR) Once daily     . NIFEdipine ER 30 MG Oral TABLET SR 24 HR Take 1 tablet (30 mg) by mouth daily. 90 tablet  0   . Nystatin 100000 UNIT/ML Mouth/Throat Suspension Swish and Swallow 5 mL 3 times a day for 14 days. Swish 1/2 dose in each side of mouth for as long as possible before swallowing. 473 mL 0   . Pantoprazole Sodium 40 MG Oral Tab EC TAKE 1 TABLET EVERY MORNING (Patient taking differently: TAKE 1 TABLET EVERY PM) 90 tablet 1   . Polyethylene Glycol 3350 (MIRALAX) Oral Powder Take 17 g by mouth.     . SYMBICORT 160-4.5 MCG/ACT  Inhalation Aerosol USE 2 INHALATIONS EVERY 12 HOURS 30.6 g 2   . Tamsulosin HCl 0.4 MG Oral Cap TAKE 1 CAPSULE AT BEDTIME 90 capsule 2   . Tiotropium Bromide Monohydrate (SPIRIVA HANDIHALER) 18 MCG Inhalation Cap Inhale contents of 1 capsule (18 mcg) by mouth daily. After breathing out, inhale a second time. 90 capsule 1   . Triamcinolone Acetonide 0.1 % External Ointment Apply 1 application topically 2 times a day. Apply to affected areas bid 80 g 1     No current facility-administered medications for this visit.        ALLERGIES:  Penicillin g and Percocet [oxycodone-acetaminophen]    PSFH: There have been no interval changes in the patient's health since the last visit.  Continues to live at home with 2 Maltese and a bird    REVIEW OF SYSTEMS   CONSTITUTIONAL: No weight loss, fever.  CARDIOVASCULAR:  See HPI.  GI:  No melena, hematochezia, hematemesis.  GU:  No dysuria, hematuria.  NEURO:  No TIA or CVA symptoms.    The remainder of the complete ROS is negative other than what is noted in the HPI.    PHYSICAL EXAM   CONSTITUTIONAL: This is a  male  in no distress.    BP 136/80   Pulse 68   Ht 5\' 11"  (1.803 m)   Wt (!) 220 lb 3.2 oz (99.9 kg)   BMI 30.71 kg/m      Physical examination was deferred due to time spent counseling.    10 minutes were spent with patient with greater than 50% of time in face-to-face counseling/consultation.  See plan for details         01/06/2016 11:50   Cholesterol (Total) 170   Triglyceride 67   Cholesterol (LDL) 81   Cholesterol (HDL) 76     IMPRESSION:   1. CAD with remote NSTEMI and LAD stenting.  2. Ascending aortic aneurysm followed by non-contrast CT. Relatively stable at 4.5 cm on CT 12/2015 .   3. Oxygen dependent COPD.  4.  Outside ECG which computer read as Afib (erroneous reading on this tracing) but is in NSR. He remains at risk for this, but no current diagnosis is confirmed.  5. Hyperlipidemia.LDL reduced from 112 to 81 on higher dose of atorvastatin.    PLAN:   RTC  1 year  No change in meds  Cardiac meds can be refilled for the next 12 months    Almyra Free P. Myrle Sheng, MD  CC:   Levora Angel, MD

## 2016-01-16 ENCOUNTER — Ambulatory Visit (INDEPENDENT_AMBULATORY_CARE_PROVIDER_SITE_OTHER): Payer: No Typology Code available for payment source | Admitting: Cardiovascular Disease

## 2016-01-16 ENCOUNTER — Encounter (INDEPENDENT_AMBULATORY_CARE_PROVIDER_SITE_OTHER): Payer: Self-pay | Admitting: Cardiovascular Disease

## 2016-01-16 VITALS — BP 136/80 | HR 68 | Ht 71.0 in | Wt 220.2 lb

## 2016-01-16 DIAGNOSIS — I25119 Atherosclerotic heart disease of native coronary artery with unspecified angina pectoris: Secondary | ICD-10-CM

## 2016-01-16 DIAGNOSIS — I712 Thoracic aortic aneurysm, without rupture, unspecified: Secondary | ICD-10-CM

## 2016-01-16 DIAGNOSIS — Z6830 Body mass index (BMI) 30.0-30.9, adult: Secondary | ICD-10-CM

## 2016-01-21 ENCOUNTER — Telehealth (INDEPENDENT_AMBULATORY_CARE_PROVIDER_SITE_OTHER): Payer: Self-pay | Admitting: Family Medicine

## 2016-01-21 ENCOUNTER — Other Ambulatory Visit: Payer: Self-pay

## 2016-01-21 ENCOUNTER — Other Ambulatory Visit (INDEPENDENT_AMBULATORY_CARE_PROVIDER_SITE_OTHER): Payer: Self-pay | Admitting: Family Medicine

## 2016-01-21 DIAGNOSIS — I10 Essential (primary) hypertension: Secondary | ICD-10-CM

## 2016-01-21 NOTE — Telephone Encounter (Signed)
(  TEXTING IS AN OPTION FOR [facility] CLIN[facility] ONLY)  Is this a [place] clinic? Yes. Patient declined the option to receive mobile text messages.      RETURN CALL: Detailed message on voicemail only      SUBJECT:  Appointment Request     REASON FOR REQUEST/SYMPTOMS: injection for hip pain  REFERRING PROVIDER: n/a  REQUEST APPOINTMENT WITH: Dr Amie Critchley  REQUESTED DATE: as soon as possible, TIME: any  UNABLE TO APPOINT BECAUSE: Per SOP. Please call back to discuss, thank you!

## 2016-01-22 MED ORDER — NIFEDIPINE ER 30 MG OR TB24
30.0000 mg | EXTENDED_RELEASE_TABLET | Freq: Every day | ORAL | 1 refills | Status: DC
Start: 2016-01-22 — End: 2016-06-29

## 2016-01-22 NOTE — Telephone Encounter (Signed)
Last annual wellness visit [facility] 05/31/15.    01/16/16 [facility]:  No change in meds  Cardiac meds can be refilled for the next 12 months    Julie P. Myrle Sheng, MD

## 2016-01-22 NOTE — Telephone Encounter (Signed)
Called and spoke to patient.He is schedule for  Consult with Amie Critchley 10/13 @ 10am.      Closing TE

## 2016-01-23 ENCOUNTER — Encounter (INDEPENDENT_AMBULATORY_CARE_PROVIDER_SITE_OTHER): Payer: No Typology Code available for payment source | Admitting: Internal Medicine

## 2016-01-27 ENCOUNTER — Encounter (INDEPENDENT_AMBULATORY_CARE_PROVIDER_SITE_OTHER): Payer: Self-pay | Admitting: Adult Health

## 2016-01-27 ENCOUNTER — Ambulatory Visit (INDEPENDENT_AMBULATORY_CARE_PROVIDER_SITE_OTHER): Payer: No Typology Code available for payment source | Admitting: Adult Health

## 2016-01-27 VITALS — BP 145/72 | HR 70 | Temp 98.2°F | Resp 16 | Wt 222.0 lb

## 2016-01-27 DIAGNOSIS — H6123 Impacted cerumen, bilateral: Secondary | ICD-10-CM

## 2016-01-27 NOTE — Progress Notes (Signed)
Patient's PCP: Levora Angel, MD     Subjective:  Patient is a 81 year old male, here to discuss Cerumen Impaction (bilateral, worsen on R)    HPI  Ear concern   - audiologist told him he needed to have ear flushed   - hard to hear out of both ears, has hearing aids    - right is much worse than left   - no pain or drainage     Otherwise doing well. Flu shot on Friday with [facility]      Review of Systems   Constitutional: Negative for chills.   HENT: Negative for dental problem, ear discharge, ear pain and facial swelling.        Objective:  Vitals:    01/27/16 1022   BP: 145/72   BP Cuff Size: Regular   BP Position: Sitting   Pulse: 70   Resp: 16   Temp: 98.2 F (36.8 C)   TempSrc: Temporal   SpO2: 95%   Weight: (!) 222 lb (100.7 kg)      Physical Exam   Constitutional: He is oriented to person, place, and time. No distress.   HENT:   Ear Lavage Procedure Documentation:     Moderate cerumen right ear canal- debrox drops applied by me.     Bilateral lavage performed by clinical staff using   the [facility] system    Bilateral normal TM visualized after procedure.     Did patient tolerate the procedure well? (if no, indicate any problems): Yes   Eyes: Conjunctivae are normal.   Cardiovascular: Normal rate.    Pulmonary/Chest: Effort normal.   Using portable oxygen    Neurological: He is alert and oriented to person, place, and time. Coordination normal.   Skin: Skin is warm and dry. He is not diaphoretic.   Psychiatric: He has a normal mood and affect. His behavior is normal. Judgment and thought content normal.      Assessment and Plan:  1. Bilateral impacted cerumen  - REMOVAL IMPACTED CERUMEN IRRIGATION/LVG UNILAT

## 2016-01-27 NOTE — Progress Notes (Signed)
Reason for Visit: see CC    Refills? NO  Referral? NO  Letter or Form? NO  Lab Results? NO    HEALTH MAINTENANCE:  Has the patient had this done since their last visit?  Cervical screening/PAP: N/A  Mammo: N/A  Colon Screen: N/A    Have you seen a specialist since your last visit: Yes    Vaccines Due? Yes, Flu (received last Fri. by [facility], at resident's facility),     HM Due:   Health Maintenance   Topic Date Due   . Colonoscopy  07/14/2017   . Tetanus Vaccine  05/20/2020   . Cholesterol Test  01/05/2021   . Zoster Vaccine  Completed   . Pneumococcal Vaccine  Completed   . Influenza Vaccine  Completed       PCP Verified?  Yes, [place]

## 2016-01-27 NOTE — Patient Instructions (Signed)
Patient Education     Earwax (Treated)    Everyone produces earwax from the lining of the ear canal. It lubricates and protects the ear. The wax that forms in the canal slowly moves toward the outside of the ear and falls out. Sometimes wax can build up in the ear canal. This can cause a blockage and loss of hearing. A buildup of earwax was removed from your ear today.  Home care  If you have a tendency to build up wax in the ear canal, you should clear the wax at home regularly, before it causes discomfort. This should be about once every six months.   Unless a medicine was prescribed, you may use an over-the-counter product made for clearing earwax. These contain carbamide peroxide and are available over-the-counter in a kit with a small bulb syringe.   Lie down with the blocked ear facing upward. Apply one dropper full of medicine and wait a few minutes. Grasp the outer ear and wiggle it to help the solution enter the canal.   Lean over a sink or basin with the blocked ear turned downward. Use a rubber bulb syringe filled with warm (not hot or cold) water to rinse the ear several times. Use gentle pressure only. You may need to repeat the irrigation several times before the wax flows out.   If you are having trouble draining all the water out of your ear canal, put a few drops of rubbing alcohol into the ear canal. This will help remove the remaining water.  Don'ts   Don't use cold water to rinse the ear. This will make you dizzy.   Don't do this procedure if you have an ear infection. Symptoms include ear pain, fever, or fluid draining from the ear.   Don't do this procedure if you have a punctured eardrum.   Don't use cotton swabs, matches, hairpins, keys, or other objects to "clean" the ear canal. This can cause infection of the ear canal or rupture of the eardrum. Because of their size and shape, cotton swabs can push the earwax deeper into the ear canal instead of removing it.  Follow-up  care  Follow up with your healthcare provider, or as advised.  When to seek medical advice  Call your healthcare provider right away if any of these occur:   Worsening ear pain   Fever of 100.74F (38C) or higher, or as directed by your healthcare provider   Hearing does not return to normal after three days of treatment   Fluid drainage or bleeding from the ear canal   Swelling, redness, or tenderness of the outer ear   Headache, neck pain, or stiff neck   2000-2016 [employer] [address] [place] [address]. All rights reserved. This information is not intended as a substitute for professional medical care. Always follow your healthcare professional's instructions.

## 2016-01-28 NOTE — Progress Notes (Signed)
Ear Lavage Procedure Documentation:   After written order from provider, I irrigated the patient's ear(s) using the [facility] system and a solution of body temperature (warm) water.    Which side did you treat?  Both sides     Did you put a softening agent in? (if yes, indicate the agent used): Yes, .by Raquel Sarna only on the R sied only. Cerrmen removed  Did you visualize the eardrum afterwards? Yes, by Earline Mayotte    Did patient tolerate the procedure well? (if no, indicate any problems): Yes

## 2016-01-30 ENCOUNTER — Inpatient Hospital Stay: Payer: Self-pay

## 2016-01-30 ENCOUNTER — Telehealth (INDEPENDENT_AMBULATORY_CARE_PROVIDER_SITE_OTHER): Payer: Self-pay | Admitting: Family Medicine

## 2016-01-30 NOTE — Telephone Encounter (Signed)
I see Tracy Conway is scheduled for a hip injection next week Friday 10/13. I do not perform these here and would like to do this at the [facility].    Can we direct him there? Number is [id number], option 8. Please schedule a 60-minute appointment, because I think he needs a brief re-evaluation prior to the injection to confirm we are doing it in the right locaiton.    Directing to the front desk pool, but if needed can re-direct to [place].    Thank you!

## 2016-01-30 NOTE — Telephone Encounter (Signed)
1st attempt Both preferred and mobile, not set up to leave a VM.    CCR: if patient calls back please see provider message and re route them to [facility] , thank you!

## 2016-02-03 NOTE — Telephone Encounter (Signed)
Spoke to patient and was able to give the message from provider. Patient stated that he would call today to schedule visit.       No further action need it closing TE.

## 2016-02-03 NOTE — Telephone Encounter (Signed)
Just want to try to follow up on this again, since it isn't clear we have reached him.

## 2016-02-04 ENCOUNTER — Encounter (HOSPITAL_BASED_OUTPATIENT_CLINIC_OR_DEPARTMENT_OTHER): Payer: Self-pay | Admitting: Family Medicine

## 2016-02-04 ENCOUNTER — Encounter (HOSPITAL_BASED_OUTPATIENT_CLINIC_OR_DEPARTMENT_OTHER): Payer: No Typology Code available for payment source | Admitting: Family Medicine

## 2016-02-04 ENCOUNTER — Ambulatory Visit: Payer: No Typology Code available for payment source | Attending: Family Medicine | Admitting: Family Medicine

## 2016-02-04 VITALS — BP 111/57 | HR 73 | Ht 70.98 in | Wt 222.0 lb

## 2016-02-04 DIAGNOSIS — M48061 Spinal stenosis, lumbar region without neurogenic claudication: Secondary | ICD-10-CM

## 2016-02-04 DIAGNOSIS — M5432 Sciatica, left side: Secondary | ICD-10-CM

## 2016-02-04 NOTE — Progress Notes (Signed)
[place] [facility]PATIENT CL[facility] NOTE    ID and Chief Complaint   Tracy Conway is a 81 year old male who comes to clinic today primarily to follow up on left hip pain (1).    History of Present Complaint(s)   1. Hip pain  Tracy Conway is a 81 year old male who comes in today to follow up on left hip pain. It really hasn't changed much at all. It is worse in the morning. It starts in the back of his left hip and radiates down his leg in a "shock-like" fashion. He has done physical therapy 4 times with no benefit. He takes 1000 mg of acetaminophen once a day in the morning with minimal relief.    Review of Systems   NEUROLOGIC: As above; denies bowel or bladder incontinence or changes or saddle anesthesia  RHEUMATOLOGIC/MSK: As per HPI    Patient Active Problem List   Diagnosis   . HYPERTENSION NOS(aka HTN)   . Pure hypercholesterolemia   . MALIG NEOPLASM DORSAL TONGUE(aka CANCER)   . Chronic Kidney Disease -- Stage III   . Tobacco use disorder   . Unspecified hearing loss   . ECZEMA    . ERECTILE DYSFUNCTION   . Benign neoplasm of colon   . Calculus of kidney   . Panlobular emphysema (Tracy Conway)   . Thoracic aneurysm without mention of rupture   . AAA (abdominal aortic aneurysm) ([place])   . Ventricular septal defect   . Hypertrophy of prostate without urinary obstruction and other lower urinary tract symptoms (LUTS)   . Generalized osteoarthrosis, unspecified site   . Diverticulitis of colon (without mention of hemorrhage)(562.11)   . Routine general medical examination at a health care facility   . Lumbago   . Malignant neoplasm of prostate ([place])   . Coronary artery disease involving native coronary artery of native heart with angina pectoris ([place])   . Lumbar stenosis with neurogenic claudication   . Major depressive disorder with single episode, in full remission ([place])   . Alcohol use disorder, moderate, in sustained remission   . Carotid stenosis   . Falls   . Pneumothorax on right   . Right rib fracture      . Vocal fold atrophy   . ventricular polyps   . Dysphonia     Past Medical History:   Diagnosis Date   . Abdominal aneurysm without mention of rupture ([place]) 12/24/2005    Borderline, seen on CT 8/06.   . Aortic aneurysm of unspecified site without mention of rupture ([facility])     Aortic Aneurysm   . Benign neoplasm of colon 12/24/2005    Tubular adenoma. Dx'ed flex sig 2000--> colonoscopy 2000: N.  F/U 2003: mild diverticulosis, tubular adenoma.  F/U due 2008 with CT colonography.   . Calculus of kidney 12/24/2005    Lithotripsy 1988   . COPD 12/24/2005    emphysema   . Diverticulitis of colon (without mention of hemorrhage)(562.11) 01/07/2007    Hospitalized at [facility] May 2008   . ECZEMA  12/24/2005    Lower extremities in particular.   Tracy Conway Kitchen ERECTILE DYSFUNCTION 12/24/2005   . Generalized osteoarthrosis, unspecified site 01/07/2007    Has seen orthopedics--cortisone shot to L knee helped.   . Hypertrophy of prostate without urinary obstruction and other lower urinary tract symptoms (LUTS) 12/24/2005   . MALIG NEOPLASM DORSAL TONGUE(aka CANCER) 03/29/2002    Ssurgery 1994, T1N1 squamous cell ca L tongue, partial glossectomy; L  supraorbital hyoid neck dissection    . Other specified disorders resulting from impaired renal function 07/22/2005    Creat ~ 1.2   . Sleep apnea    . Thoracic aneurysm without mention of rupture ([place]) 12/24/2005    Ascending aortic aneurysm.  Followed by Tracy Conway, Cardiac surgery.  Gets regular CT scans.  Goal systolic BP: 99991111   . Tobacco use disorder 12/24/2005    Quit 2000.   Tracy Conway Kitchen Unspecified disorder of muscle, ligament, and fascia    . Unspecified hearing loss 12/24/2005    Bilat hearing aids.   . Ventricular septal defect 12/24/2005    Small muscular VSD--NEEDS SBE PROPHYLAXIS. (Clindamycin 600 mg prior to dental visits.)     Family History   Problem Relation Age of Onset   . Cancer Father      Prostate   . Arthritis Father    . Other Father      Chronic Kidney Disease   . Psych Mother      Alzheimer's  Disease   . Cancer Brother      Past Surgical History:   Procedure Laterality Date   . brachy therapy     . Fusion L foot distal phalange     . heart stent placement  09/2010, 02/2011    Tracy Conway   . LIGATION PRQ VAS DEFERENS UNI/BI SPX     . lipotripsy surgery     . Lithotrypsy of R renal calculi     . Removal of Tongue Carcinoma with Lymphadenectomy     . Retinaculum release B/L wrists     . REVJ TOT KNEE ARTHRP [place]      left knee   . RPR 1ST INGUN HRNA AGE 25 MO-5 YRS REDUCIBLE     . UNLISTED PROCEDURE SPINE  Mar 21 2012    L spine surgery    . Vasectomy Reversal     . VASECTOMY W/TRAY      x2   . Ventral Hernia Repair      x3     Social History     Social History   . Marital status: Married     Spouse name: N/A   . Number of children: 5   . Years of education: N/A     Occupational History   . 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 Smoking status: Former Smoker     Packs/day: 1.00     Years: 40.00     Types: Cigarettes     Quit date: 05/11/1998   . Smokeless tobacco: Never Used      Comment: quit three years ago   . Alcohol use No      Comment: quit 17 years ago   . Drug use: No   . Sexual activity: Yes     Partners: Female      Comment: no protection     Other Topics Concern   . Not on file     Social History Narrative    From sept 2011 note from Dr. Claybon Conway, cardiology:  He has been married 3 times, 25 years to his current wife.  He has 5 daughters living and well although two of his daughters are rather overweight. He is working as a Water quality scientist. He smoked one package of cigarettes a day from the age of 64 years until about 10 years ago, although for the last 5 years of smoking, he  was down to about half a package of cigarettes a day.  He is an admitted alcoholic and quit drinking in 1981, at which time he attended [employer] [place] meetings but not any more.        1.24.2012: Married to Tracy Conway. 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 Just retired! Plans on  spending more time woodworking. Goes to gym--exercises 20-30 mins every other day. Ct    -- Considering working for [employer]     Current Outpatient Prescriptions   Medication Sig Dispense Refill   . Acetaminophen 500 MG Oral Tab Take 1-2 tablets three times daily for left hip pain. Do not exceed 3000 mg of acetaminophen in a day. 100 tablet 2   . Albuterol Sulfate HFA 108 (90 BASE) MCG/ACT Inhalation Aero Soln Inhale 2 puffs by mouth every 4 hours as needed for shortness of breath/wheezing. For wheezing and/or cough. 1 Inhaler 12   . Aspirin 81 MG Oral Tab 1 tablet by mouth once daily     . Atorvastatin Calcium (LIPITOR) 80 MG Oral Tab Take 1 tablet (80 mg) by mouth daily. 90 tablet 3   . Cyanocobalamin (B-12) 100 MCG Oral Tab Take 100 mcg by mouth.     . Furosemide 20 MG Oral Tab Take 1 tablet (20 mg) by mouth daily. 90 tablet 3   . Metoprolol Succinate ER 100 MG Oral TABLET SR 24 HR Take 1 tablet (100 mg) by mouth daily. 90 tablet 1   . Multiple Vitamins-Minerals (MULTIVITAMIN OR) Once daily     . NIFEdipine ER 30 MG Oral TABLET SR 24 HR Take 1 tablet (30 mg) by mouth daily. 90 tablet 1   . Pantoprazole Sodium 40 MG Oral Tab EC TAKE 1 TABLET EVERY MORNING (Patient taking differently: TAKE 1 TABLET EVERY EVENING) 90 tablet 1   . Polyethylene Glycol 3350 (MIRALAX) Oral Powder Take 17 g by mouth.     . SYMBICORT 160-4.5 MCG/ACT Inhalation Aerosol USE 2 INHALATIONS EVERY 12 HOURS 30.6 g 2   . Tamsulosin HCl 0.4 MG Oral Cap TAKE 1 CAPSULE AT BEDTIME 90 capsule 2   . Tiotropium Bromide Monohydrate (SPIRIVA HANDIHALER) 18 MCG Inhalation Cap Inhale contents of 1 capsule (18 mcg) by mouth daily. After breathing out, inhale a second time. 90 capsule 1   . Triamcinolone Acetonide 0.1 % External Ointment Apply 1 application topically 2 times a day. Apply to affected areas bid 80 g 1     No current facility-administered medications for this visit.      Review of patient's allergies indicates:  Allergies   Allergen Reactions    . Penicillin G      hives     . Percocet [Oxycodone-Acetaminophen]      indigestion       Physical Exam   BP 111/57   Pulse 73   Ht 5' 10.98" (1.803 m)   Wt (!) 222 lb (100.7 kg)   BMI 30.98 kg/m   GENERAL: No distress. Pleasant and cooperative with exam.    MUSCULOSKELETAL EXAM (Focus on left hip)  Visualization:  -- No gross deformities  Palpation:  -- Moderate tenderness to palpation of the proximal hamstring tendon and along the proximal hamstring muscle body  -- Moderate tenderness to palpation of the ischial tuberosity  -- No tenderness to palpation of the piriformis  -- No tenderness to palpation of the quadratus femoris  Range of motion:  -- Internal rotation to 30 degrees (40 degrees normal)  with no pain  -- External rotation to 40 degrees (60 degrees normal) with no pain  Strength:  -- 4+/5 hip flexion with straight knee with no pain  -- 4+/5 hip flexion with bent knee with no pain  -- 4+/5 hip adduction with no pain  -- 4+/5 hip abduction with no pain  -- 4+/5 external rotation with no pain  Special tests:  -- FABER test: Negative  -- FADIR test: Negative  Neurovascular:  -- Sensation intact to light touch throughout bilateral lower extremities    _____________________________________________________________________________    Assessment   Tracy Conway is a 81 year old male presenting with left hip pain and an overall clinical picture 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 The differential also includes a lumbar spine process given his known degenerative disease, which is an extremely close second place on the differential diagnosis list. I lean toward a proximal sciatic nerve entrapment at present given the primary focus of his pain being the posterolateral hip with radiation (without sensory disturbance observed today) down the leg a bit.    Plan   We discussed the nature of the  diagnosis of a sciatic nerve entrapment (versus lumbar radiculopathy) as well as the relevant anatomy, related biomechanics, and treatment options. Specifically, I recommended proceeding with a diagnostic ultrasound and possible sciatic nerve hydrodissection in the proximal thigh versus piriformis injection, leaning toward the former. We also reviewed the other possible diagnoses and next potential diagnostic steps. We discussed all of this in the context of Tracy Conway activity level and employment. We agreed to try a diagnostic and hopefully therapeutic sciatic nerve hydrodissection to start. If this does not provide adequate relief, a referred process from his spine seems much more likely in my mind, and we could consider a physiatry referral.  -- Scheduled him for next week    I spent a total time of 25 minutes face-to-face with the patient, of which more than 50% was spent counseling and coordinating care as outlined in this note.    Tracy Conway agreed with the above plan and was given an opportunity to ask questions before they left clinic. I answered these questions to the best of my ability.    --  Tracy Roys. Amie Critchley, MD, CAQSM  Assistant Professor  [place] Department of [facility] Medicine  Sports Medicine, Residency sections

## 2016-02-07 ENCOUNTER — Institutional Professional Consult (permissible substitution) (INDEPENDENT_AMBULATORY_CARE_PROVIDER_SITE_OTHER): Payer: No Typology Code available for payment source | Admitting: Family Medicine

## 2016-02-10 ENCOUNTER — Ambulatory Visit: Payer: No Typology Code available for payment source | Attending: Family Medicine | Admitting: Family Medicine

## 2016-02-10 ENCOUNTER — Encounter (HOSPITAL_BASED_OUTPATIENT_CLINIC_OR_DEPARTMENT_OTHER): Payer: Self-pay | Admitting: Family Medicine

## 2016-02-10 VITALS — BP 111/57 | HR 68 | Ht 70.98 in | Wt 224.0 lb

## 2016-02-10 DIAGNOSIS — M5432 Sciatica, left side: Secondary | ICD-10-CM | POA: Insufficient documentation

## 2016-02-10 MED ORDER — DEXAMETHASONE SODIUM PHOSPHATE 4 MG/ML IJ SOLN
4.0000 mg | Freq: Once | INTRAMUSCULAR | Status: AC
Start: 2016-02-10 — End: 2016-02-10
  Administered 2016-02-10: 4 mg via INTRALESIONAL

## 2016-02-10 NOTE — Patient Instructions (Signed)
Please take it easy for a few days. Then, in about 2 weeks, let me know how you are doing. At that point, we can make a determination as to if this was the problem or not.    No reason to think this will happen, but red/hot/tender skin, fevers, or discharge could be a sign of an infection. This is very rare, but please let me know right away.

## 2016-02-10 NOTE — Progress Notes (Signed)
Tracy Conway presents today for a previously discussed and agreed upon hydrodissection of the left sciatic nerve.    Hydrodissection of the left sciatic nerve    Informed consent  The risks and benefits of the procedure were discussed. Potential benefits include decreased pain, reduced inflammation, and improved function. The risks include, but are not limited to, damage to surrounding structures, a small risk of bleeding, a reaction to the medicine, allergy, infection, skin atrophy, or hypopigmentation. The patient was given an opportunity to ask questions. Both verbal and written consent were obtained before the procedure. The patient agreed to proceed, and signed consent was scanned into his chart.    Procedure  Final verification of correct patient identity, procedure, site/side and instrument was performed prior to start of procedure: YES    With the patient in the near-prone position (patient cannot tolerate full prone due to breathing). A pre-scan ultrasound was performed to confirm the sciatic nerve location and the surrounding structures and vasculature; a curvelinear probe was used. The injection site was marked with a marking pen. The area was then cleansed with chlorhexidine three times. The ultrasound probes were also sterilely prepped. A small amount of 1% lidocaine was infiltrated in the overlying skin and along the planned injection path using a 25-gauge needle and continual ultrasound guidance. Then, a mixture of 15 cc normal saline, 1 cc 1% lidocaine, and 1 cc of 4 mg/cc dexamethasone, lot number [id number] and expiration 12/2016, was then injected around the sciatic nerve using a 22 gauge spinal needle. The injectate could be seen separating the sciatic nerve from around the surrounding tissue. The needle was withdrawn, the area cleansed, and a sterile bandage placed. Good hemostasis was noted. The patient tolerated the procedure well. There were no complications.    Aftercare  instructions  Post-procedural care and return precautions, including infection warning signs, were reviewed with the patient prior to them leaving clinic. They verbalized understanding. All questions were answered.    Follow-up:  By phone/[facility] in 2 weeks. Next steps if no improvement is achieved is to image his lumbar spine again.    --  [name] Amie Critchley, MD, CAQSM  Assistant Professor  [place] Department of [facility] Medicine  Sports Medicine, Residency sections

## 2016-02-14 ENCOUNTER — Other Ambulatory Visit (INDEPENDENT_AMBULATORY_CARE_PROVIDER_SITE_OTHER): Payer: Self-pay | Admitting: Family Medicine

## 2016-02-14 DIAGNOSIS — K219 Gastro-esophageal reflux disease without esophagitis: Secondary | ICD-10-CM

## 2016-02-14 DIAGNOSIS — I10 Essential (primary) hypertension: Secondary | ICD-10-CM

## 2016-02-14 MED ORDER — PANTOPRAZOLE SODIUM 40 MG OR TBEC
40.0000 mg | DELAYED_RELEASE_TABLET | Freq: Every day | ORAL | 0 refills | Status: DC
Start: 2016-02-14 — End: 2016-04-23

## 2016-02-14 MED ORDER — METOPROLOL SUCCINATE ER 100 MG OR TB24
100.0000 mg | EXTENDED_RELEASE_TABLET | Freq: Every day | ORAL | 0 refills | Status: DC
Start: 2016-02-14 — End: 2016-05-15

## 2016-02-16 ENCOUNTER — Encounter (HOSPITAL_BASED_OUTPATIENT_CLINIC_OR_DEPARTMENT_OTHER): Payer: Self-pay | Admitting: Family Medicine

## 2016-02-17 NOTE — Telephone Encounter (Signed)
81 year old male seen in clinic 02/10/16 for left sciatica nerve Hydrodissection  .     -----------------------------------------------------------------    Hip Pain, 02/16/2016 3:24 PM Reply    To: [name]      From: Zoe Lan      Created: 02/16/2016 3:24 PM        *-*-*This message has not been handled.*-*-*    Hi Dr Amie Critchley,  It has been one week since the injection in my left hip. I have not noticed any pain relief. my pain level in the morning is about a 5 to 6, a bit less than before.What is the next step?I have an appointment with Dr. Satira Sark on 10/31.Your thoughts?    Tracy Conway        -------------------------------------------------------------------------  Follow-up instructions from clinic visit 02/10/16       Follow-up:  By phone/[facility] in 2 weeks. Next steps if no improvement is achieved is to image his lumbar spine again.    --  [name] Amie Critchley, MD, CAQSM  Assistant Professor  [place] Department of [facility] Medicine  Sports Medicine, Residency sections  --------------------------------------------------------------------------    [name]

## 2016-02-25 ENCOUNTER — Ambulatory Visit (INDEPENDENT_AMBULATORY_CARE_PROVIDER_SITE_OTHER): Payer: No Typology Code available for payment source | Admitting: Family Medicine

## 2016-02-25 ENCOUNTER — Encounter (INDEPENDENT_AMBULATORY_CARE_PROVIDER_SITE_OTHER): Payer: Self-pay | Admitting: Family Medicine

## 2016-02-25 ENCOUNTER — Telehealth (INDEPENDENT_AMBULATORY_CARE_PROVIDER_SITE_OTHER): Payer: Self-pay | Admitting: Family Medicine

## 2016-02-25 VITALS — BP 138/67 | HR 86 | Temp 99.0°F

## 2016-02-25 DIAGNOSIS — M7062 Trochanteric bursitis, left hip: Secondary | ICD-10-CM

## 2016-02-25 DIAGNOSIS — M7918 Myalgia, other site: Secondary | ICD-10-CM

## 2016-02-25 DIAGNOSIS — M791 Myalgia: Secondary | ICD-10-CM

## 2016-02-25 MED ORDER — HYDROCODONE-ACETAMINOPHEN 10-300 MG OR TABS
ORAL_TABLET | ORAL | 0 refills | Status: DC
Start: 2016-02-25 — End: 2016-03-12

## 2016-02-25 NOTE — Patient Instructions (Signed)
Come on back for a visit afte the MRI.  Good luck!

## 2016-02-25 NOTE — Telephone Encounter (Signed)
Patient is currently at pharmacy trying to fill. Pharmacist would like to know if change could be made today. Thank you!

## 2016-02-25 NOTE — Telephone Encounter (Signed)
Change approved by Dr. Satira Sark.  Pharmacy informed.    Closing.

## 2016-02-25 NOTE — Progress Notes (Signed)
S:  Patient is here to follow-up left hip pain  Procedure by Dr. Amie Critchley did not help--the day after the procedure, in the morning, pain was a bit better but no improvement after that.  Now his pain is just as bad as it was before  He says that the morning pain takes his breath away--7-8/10--worst when he gets up in the morning--takes 2 tylenols at that time with morning meds--they do not do much. Pain a bit better later in the day  Now pain is about 4/10--does not get better than that  Has been going on X 3-4 months  Saw Dr. Rao--received steroid injection in the hip--no help  Pain is located in the left Buttock and left lower back back and radiates down the leg to the knee  Pain does interfere with ability to sleep--sometimes has to get up in the night to urinate--pain is ok then but as soon as he starts urinating the pain gets worse    Similar pain on R in 2013--had MRI--did have surgery after that for spinal stenosis  Impression   Impression: 1. Severe central canal narrowing at L4-L5, with moderate-severe left foraminal narrowing at L5-S1.      O:  Pt alert and cooperative and in NAD.  BP 138/67   Pulse 86   Temp 99 F (37.2 C) (Temporal)   SpO2 92%    Wearing o2 by nasal cannula  Transfers ok though a bit slow in the office    A/P:  (M79.1) Left buttock pain  (primary encounter diagnosis)  Plan: MRI L SPINE W/O CONTRAST,         Hydrocodone-Acetaminophen 10-300 MG Oral Tab        I am worried that the patient is not getting better with usual therapy. I do wonder if the spinal stenosis is now affecting him  On the L.  Will get MRI to see if this helps sort things out. Did have xray hips and pelvis 12/2015 which showed degenerative changes  As pt is so affected by the pain of a condition that I hope may be temporary, I decided to start him on a low dose of narcotics.  He was warned of the deleterious effects of this medication.  He will use this only when the pain is very bad and not regularly.  I am hoping  the pain relief will allow him to be more functional. I do not anticipate that this will be a long term medication and so I did not have the pt sign a pain contract at this time.    rtc after the MRI and we will decide on next steps.    Theresia Bough, MD

## 2016-02-25 NOTE — Telephone Encounter (Signed)
Routing to Dr. Satira Sark to advise on pt request for dosage change.

## 2016-02-25 NOTE — Telephone Encounter (Signed)
(  TEXTING IS AN OPTION FOR [facility] CLINICS ONLY)  Is this a [place] clinic? No      RETURN CALL: General message OK      SUBJECT:  Medication Management/Questions     MEDICATION(S): Hydrocodone acetaminophen 10-300 MG  CONCERNS/QUESTIONS: wants to change to 10-325MG    ADDITIONAL INFORMATION: Please contact pharmacy to make change please. Thank you.

## 2016-02-26 ENCOUNTER — Encounter (INDEPENDENT_AMBULATORY_CARE_PROVIDER_SITE_OTHER): Payer: Self-pay | Admitting: Family Medicine

## 2016-02-26 NOTE — Telephone Encounter (Signed)
Generic response sent to patient.  Pt appears to be requesting an appointment for MRI follow up.    Routing to Dr. Satira Sark. Your next available 30 min appt is 11/20 (holding 10 AM for the patient). Does pt need to see you sooner?

## 2016-02-26 NOTE — Telephone Encounter (Signed)
Maybe we could get him in a shorter visit sooner? (after nov 8!)  Thank you for your help.

## 2016-02-27 NOTE — Telephone Encounter (Signed)
Offered pt 11/16 at 4:45 (15 min ok per provider).

## 2016-02-27 NOTE — Telephone Encounter (Signed)
Pt scheduled 11/16 at 4:45 for 15 min appt.    FYI to Dr. Satira Sark.

## 2016-03-02 NOTE — Telephone Encounter (Signed)
Dr. Satira Sark: Peer to peer review required for Lumbar Spine - MRI. When you have a moment, please contact [facility] at [phone] for peer to peer review. Thanks.  Insurance ID: [id number]

## 2016-03-03 ENCOUNTER — Other Ambulatory Visit (INDEPENDENT_AMBULATORY_CARE_PROVIDER_SITE_OTHER): Payer: Self-pay | Admitting: Family Medicine

## 2016-03-03 ENCOUNTER — Telehealth (INDEPENDENT_AMBULATORY_CARE_PROVIDER_SITE_OTHER): Payer: Self-pay | Admitting: Family Medicine

## 2016-03-03 DIAGNOSIS — J449 Chronic obstructive pulmonary disease, unspecified: Secondary | ICD-10-CM

## 2016-03-03 NOTE — Telephone Encounter (Signed)
MRI authorized and updated.

## 2016-03-03 NOTE — Telephone Encounter (Signed)
Dr. Satira Sark: Peer to peer review required for Lumbar Spine - MRI. When you have a moment, please contact [facility] at [phone] for peer to peer review. Thanks.  Insurance ID: [id number]

## 2016-03-03 NOTE — Telephone Encounter (Signed)
Tracy Conway from [facility] called.    She would like for our referral coordinator to call her back, it has to do with the PA for patients schedule MRi tomorrow 11/8.      Please call her at [phone].    Thank you!

## 2016-03-03 NOTE — Telephone Encounter (Signed)
Placed call.  Test is authorized.  Authorization number: [id number]

## 2016-03-04 ENCOUNTER — Ambulatory Visit: Payer: No Typology Code available for payment source | Attending: Family Medicine

## 2016-03-04 DIAGNOSIS — M791 Myalgia: Secondary | ICD-10-CM | POA: Insufficient documentation

## 2016-03-04 MED ORDER — SPIRIVA HANDIHALER 18 MCG IN CAPS
ORAL_CAPSULE | RESPIRATORY_TRACT | 1 refills | Status: DC
Start: 2016-03-04 — End: 2016-08-11

## 2016-03-11 ENCOUNTER — Encounter (INDEPENDENT_AMBULATORY_CARE_PROVIDER_SITE_OTHER): Payer: Self-pay | Admitting: Family Medicine

## 2016-03-11 NOTE — Telephone Encounter (Signed)
Generic response sent to patient.    Routing to Dr. Satira Sark to advise. Per imaging tab, results should be viewable in eCare.

## 2016-03-12 ENCOUNTER — Ambulatory Visit (INDEPENDENT_AMBULATORY_CARE_PROVIDER_SITE_OTHER): Payer: No Typology Code available for payment source | Admitting: Family Medicine

## 2016-03-12 ENCOUNTER — Encounter (INDEPENDENT_AMBULATORY_CARE_PROVIDER_SITE_OTHER): Payer: Self-pay | Admitting: Family Medicine

## 2016-03-12 VITALS — BP 119/61 | HR 76 | Temp 98.1°F | Resp 22 | Wt 221.0 lb

## 2016-03-12 DIAGNOSIS — M791 Myalgia: Secondary | ICD-10-CM

## 2016-03-12 DIAGNOSIS — M7918 Myalgia, other site: Secondary | ICD-10-CM

## 2016-03-12 DIAGNOSIS — H6121 Impacted cerumen, right ear: Secondary | ICD-10-CM

## 2016-03-12 DIAGNOSIS — Z6830 Body mass index (BMI) 30.0-30.9, adult: Secondary | ICD-10-CM

## 2016-03-12 DIAGNOSIS — M48061 Spinal stenosis, lumbar region without neurogenic claudication: Secondary | ICD-10-CM

## 2016-03-12 MED ORDER — CARBAMIDE PEROXIDE 6.5 % OT SOLN
5.0000 [drp] | Freq: Two times a day (BID) | OTIC | 0 refills | Status: AC
Start: 2016-03-12 — End: ?

## 2016-03-12 MED ORDER — HYDROCODONE-ACETAMINOPHEN 10-325 MG OR TABS
1.0000 | ORAL_TABLET | Freq: Two times a day (BID) | ORAL | 0 refills | Status: DC | PRN
Start: 2016-03-18 — End: 2016-04-16

## 2016-03-12 NOTE — Progress Notes (Signed)
Reason for Visit: Follow up results from MRI    Refills? NO  Referral? NO  Letter or Form? NO  Lab Results? NO    HEALTH MAINTENANCE:  Has the patient had this done since their last visit?  Cervical screening/PAP: N/A  Mammo: N/A  Colon Screen: N/A    Have you seen a specialist since your last visit: No    Vaccines Due? No    HM Due:   Health Maintenance   Topic Date Due   . Colonoscopy  07/14/2017   . Tetanus Vaccine  05/20/2020   . Cholesterol Test  01/05/2021   . Zoster Vaccine  Completed   . Pneumococcal Vaccine  Completed   . Influenza Vaccine  Completed       PCP Verified?  Yes, Dr. Satira Sark

## 2016-03-12 NOTE — Progress Notes (Signed)
Tracy Conway is a 81 year old male who presents today for MRI follow up.      Assessment and Plan:    #L hip and leg pain  Injection of hip and of sciatic nerve previously without significant improvement.   - refer to neurosurgery    #cerumen impaction, R ear  Debrox recommended.    ---    Follow up: PRN    Subjective:     History of Present Illness:     Tracy Conway continues to have pain in left hip and down leg. It starts in the morning after he get up to use the restroom and returns to bed.  He has been using one hydrocodone 10 mg at the time he gets up to use the restroom, about 5 am, and another 10 mg with his morning pills around 7 or 8 am.  Pain in his hip improves during the afternoon and evening without further doses of hydrocodone and he typically goes to bed without pain. Sleeps well, without pain.  Pain is no better and no worse than last time he was seen.  He has never tried lyrica or gabapentin that he remembers.    Also thinks he has wax in his right ear, left feels fine      Objective:     BP 119/61   Pulse 76   Temp 98.1 F (36.7 C) (Temporal)   Resp 22   Wt (!) 221 lb (100.2 kg)   SpO2 92%   BMI 30.84 kg/m     Exam:    Physical Exam   Constitutional: He appears well-developed and well-nourished.   HENT:   Unable to visualize R TM due to cerumen, canal noninflammed, nontender. Hearing aid removed for exam.   Pulmonary/Chest: No respiratory distress.   Nasal cannula in nose

## 2016-03-15 NOTE — Progress Notes (Signed)
S:  Pt is here to follow up on the MRI results.  He still is having back pain though he is not needing to take his pain meds as frequently as in the past.  Pain is in L hip area on down the back of his leg.  Takes about 1 pill twice daily right now    MRI shows:  L L5-S1 disc protrusion with some impingement of S1 nerve root, some spinal stenosis.    Pt also mentions that he has some diminished hearing in the L ear, even with his hearing aid.    O:  Pt alert and cooperative and in NAD. Wearing O2 by nasal cannula. Transfers in the exam room are quite easy  BP 119/61   Pulse 76   Temp 98.1 F (36.7 C) (Temporal)   Resp 22   Wt (!) 221 lb (100.2 kg)   SpO2 92%   BMI 30.84 kg/m   Chest: diminished AE bilat, no crackles no wheezes heard.  Some wax that is deep in the R canal--looks soft    A/P:  1) back pain with MRI evidence of nerve root impingement. As he had to have surgery for similar issue in the past--will refer to neurosurg. He may not be a candidate for operative intervention though the neurosurgeons might suggest a less invasive option for him.    2) refilled pain meds. Informed pt of new [place] state opiate rules. Prescribed #42. Ideally this would last one month. I am hoping that if he can have a procedure to help with the pain, then he could go off this med altogether    3) cerumen R ear. Advised Debrox for now. Will reassess prn.    Theresia Bough, MD

## 2016-03-18 ENCOUNTER — Ambulatory Visit (INDEPENDENT_AMBULATORY_CARE_PROVIDER_SITE_OTHER): Payer: No Typology Code available for payment source | Admitting: Neurological Surgery

## 2016-03-18 VITALS — BP 97/54 | HR 75 | Ht 70.5 in | Wt 221.0 lb

## 2016-03-18 DIAGNOSIS — M5416 Radiculopathy, lumbar region: Secondary | ICD-10-CM | POA: Insufficient documentation

## 2016-03-18 DIAGNOSIS — Z6831 Body mass index (BMI) 31.0-31.9, adult: Secondary | ICD-10-CM

## 2016-03-18 NOTE — Progress Notes (Signed)
IDamaris Hippo, MD, interviewed and examined the patient while overseeing the documentation performed by my Medical Scribe, Willette Cluster. I have reviewed and revised as necessary the scribe's note and agree with the documented findings and plan of care. Please refer to the scribe's note above for further detailed information regarding the patient encounter and exam.  03/18/2016. 1:46 PM

## 2016-03-18 NOTE — Progress Notes (Signed)
[facility] Neurosurgery  Provider: Damaris Hippo, MD  Visit Date: 03/18/2016      HPI:  Tracy Conway is a 81 year old male who has a history of a laminectomy at L4-5 for a right lumbar radiculopathy. He had good relief with surgery.  He presents today with a chief complaint of lower back pain radiating to his posterior thigh. He has been taking narcotic analgesics to help manage his symptoms. His symptoms are relieved when lying down, and exacerbated when being physically active or walking. He has undergone multiple epidural steroid injections which gave him a couple of days of relief of his symptoms.     REVIEW OF SYSTEMS:  Reviewed/verified scanned ROS form with patient. Pertinent positives/negatives are listed above in HPI.    PHYSICAL EXAM:  BP 97/54   Pulse 75   Ht 5' 10.5" (1.791 m)   Wt (!) 221 lb (100.2 kg)   BMI 31.26 kg/m   Constitutional: Well-developed and well-nourished male, in no obvious distress.   Mental Status: He is awake, alert, and oriented with intact memory, language, and fund of knowledge.   Musculoskeletal / Motor: He has normal strength of the hip flexors, knee flexors, knee extensors, ankle flexors, and plantar flexors.  DTRs are 1 out of 4 at the knees and absent at the ankles.  Straight leg raise testing is negative.  He walks with quite a normal gait considering his age with only a slightly stooped forward posture.  Sensory: Normal sensation to light touch.     IMAGING RESULTS    I have reviewed the MRI of the lumbar spine and I agree with the radiologist results.      CURRENT MEDICATIONS:  Current Outpatient Prescriptions   Medication Sig Dispense Refill   . Acetaminophen 500 MG Oral Tab Take 1-2 tablets three times daily for left hip pain. Do not exceed 3000 mg of acetaminophen in a day. 100 tablet 2   . Albuterol Sulfate HFA 108 (90 BASE) MCG/ACT Inhalation Aero Soln Inhale 2 puffs by mouth every 4 hours as needed for shortness of breath/wheezing. For wheezing and/or  cough. 1 Inhaler 12   . Aspirin 81 MG Oral Tab 1 tablet by mouth once daily     . Atorvastatin Calcium (LIPITOR) 80 MG Oral Tab Take 1 tablet (80 mg) by mouth daily. 90 tablet 3   . Carbamide Peroxide 6.5 % Otic Solution Place 5-10 drops into right ear 2 times a day. 15 mL 0   . Cyanocobalamin (B-12) 100 MCG Oral Tab Take 100 mcg by mouth.     . Furosemide 20 MG Oral Tab Take 1 tablet (20 mg) by mouth daily. 90 tablet 3   . Hydrocodone-Acetaminophen 10-325 MG Oral Tab Take 1 tablet by mouth 2 times a day as needed for pain. For pain. 42 tablet 0   . Metoprolol Succinate ER 100 MG Oral TABLET SR 24 HR Take 1 tablet (100 mg) by mouth daily. 90 tablet 0   . Multiple Vitamins-Minerals (MULTIVITAMIN OR) Once daily     . NIFEdipine ER 30 MG Oral TABLET SR 24 HR Take 1 tablet (30 mg) by mouth daily. 90 tablet 1   . Pantoprazole Sodium 40 MG Oral Tab EC Take 1 tablet (40 mg) by mouth daily. 90 tablet 0   . Polyethylene Glycol 3350 (MIRALAX) Oral Powder Take 17 g by mouth.     . SPIRIVA HANDIHALER 18 MCG Inhalation Cap INHALE THE CONTENTS OF 1 CAPSULE DAILY, AFTER  BREATHING OUT, INHALE A SECOND TIME 90 capsule 1   . SYMBICORT 160-4.5 MCG/ACT Inhalation Aerosol USE 2 INHALATIONS EVERY 12 HOURS 30.6 g 2   . Tamsulosin HCl 0.4 MG Oral Cap TAKE 1 CAPSULE AT BEDTIME 90 capsule 2   . Triamcinolone Acetonide 0.1 % External Ointment Apply 1 application topically 2 times a day. Apply to affected areas bid 80 g 1     No current facility-administered medications for this visit.        ALLERGIES:  Review of patient's allergies indicates:  Allergies   Allergen Reactions   . Penicillin G      hives     . Percocet [Oxycodone-Acetaminophen]      indigestion       PAST SURGICAL HISTORY:  Past Surgical History:   Procedure Laterality Date   . brachy therapy     . Fusion L foot distal phalange     . heart stent placement  09/2010, 02/2011    [facility] [place]   . LIGATION PRQ VAS DEFERENS UNI/BI SPX     . lipotripsy surgery     . Lithotrypsy of R renal  calculi     . Removal of Tongue Carcinoma with Lymphadenectomy     . Retinaculum release B/L wrists     . REVJ TOT KNEE ARTHRP [place]      left knee   . RPR 1ST INGUN HRNA AGE 58 MO-5 YRS REDUCIBLE     . UNLISTED PROCEDURE SPINE  Mar 21 2012    L spine surgery    . Vasectomy Reversal     . VASECTOMY W/TRAY      x2   . Ventral Hernia Repair      x3         PAST MEDICAL HISTORY:   Patient Active Problem List   Diagnosis   . HYPERTENSION NOS(aka HTN)   . Pure hypercholesterolemia   . MALIG NEOPLASM DORSAL TONGUE(aka CANCER)   . Chronic Kidney Disease -- Stage III   . Tobacco use disorder   . Unspecified hearing loss   . ECZEMA    . ERECTILE DYSFUNCTION   . Benign neoplasm of colon   . Calculus of kidney   . Panlobular emphysema ([place])   . Thoracic aneurysm without mention of rupture   . AAA (abdominal aortic aneurysm) ([place])   . Ventricular septal defect   . Hypertrophy of prostate without urinary obstruction and other lower urinary tract symptoms (LUTS)   . Generalized osteoarthrosis, unspecified site   . Diverticulitis of colon (without mention of hemorrhage)(562.11)   . Routine general medical examination at a health care facility   . Lumbago   . Malignant neoplasm of prostate ([place])   . Coronary artery disease involving native coronary artery of native heart with angina pectoris ([place])   . Lumbar stenosis with neurogenic claudication   . Major depressive disorder with single episode, in full remission ([place])   . Alcohol use disorder, moderate, in sustained remission   . Carotid stenosis   . Falls   . Pneumothorax on right   . Right rib fracture   . Vocal fold atrophy   . ventricular polyps   . Dysphonia       FAMILY HISTORY:  Family History     Problem (# of Occurrences) Relation (Name,Age of Onset)    Arthritis (1) Father    Cancer (2) Father: Prostate, Brother    Other (1) Father: Chronic Kidney Disease  Psych (1) Mother: Alzheimer's Disease          SOCIAL HISTORY:  Social History     Social  History   . Marital status: Married     Spouse name: N/A   . Number of children: 5   . Years of education: N/A     Occupational History   . 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 Smoking status: Former Smoker     Packs/day: 1.00     Years: 40.00     Types: Cigarettes     Quit date: 05/11/1998   . Smokeless tobacco: Never Used      Comment: quit three years ago   . Alcohol use No      Comment: quit 17 years ago   . Drug use: No   . Sexual activity: Yes     Partners: Female      Comment: no protection     Other Topics Concern   . Not on file     Social History Narrative    From sept 2011 note from Dr. Claybon Jabs, cardiology:  He has been married 3 times, 25 years to his current wife.  He has 5 daughters living and well although two of his daughters are rather overweight. He is working as a Water quality scientist. He smoked one package of cigarettes a day from the age of 6 years until about 10 years ago, although for the last 5 years of smoking, he was down to about half a package of cigarettes a day.  He is an admitted alcoholic and quit drinking in 1981, at which time he attended [place] meetings but not any more.        1.24.2012: Married to [employer]. 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 Just retired! Plans on spending more time woodworking. Goes to gym--exercises 20-30 mins every other day. Ct    -- Considering working for [employer] / ASSESSMENT:    ICD-10-CM ICD-9-CM    1. Lumbar radiculopathy M54.16 724.4        Tracy Conway has been symptomatic from a left gluteal region and posterior thigh pain most likely representing a lumbar radiculopathy.  I see a couple of potential sources of pain on his MRI.  At L5-S1 on the left, there is a small cyst-type structure associated with a probable annular tear that is mildly posteriorly displacing the traversing S1 nerve root.  At L4 5, the level of his previous surgery, there is a mild spondylolisthesis and even small amounts  of motion at this level could be leading to irritation of the traversing L5 nerve root or exiting L4 nerve root.  Regardless, none of the findings on his MRI would respond readily to minimally invasive surgery.  Probably the surgery with the best chance of providing him with relief of his pain would be an L4 5 decompression and arthrodesis, but the treatment might lead to side effects just as severe as his pain profile.  I think he is an ideal candidate for continued management of his pain with nonsurgical management including physical therapy and epidural steroid injections.  He will follow up with me on a when necessary basis.    Prior EMR records reviewed as available and clinically relevant. All questions answered. Discharge and follow up instructions were discussed with the patient. The patient fully understands and is in agreement with the  plan.    This was a 20 minute appointment with greater than 50% of the time spent in face-to-face counseling regarding the patient's spinal pathology and treatment options.    Portions of this chart were written by Valetta Mole, Medical Scribe with oversight by Damaris Hippo, MD. 03/18/2016 1:38 PM

## 2016-04-16 ENCOUNTER — Ambulatory Visit (INDEPENDENT_AMBULATORY_CARE_PROVIDER_SITE_OTHER): Payer: No Typology Code available for payment source | Admitting: Family Medicine

## 2016-04-16 ENCOUNTER — Encounter (INDEPENDENT_AMBULATORY_CARE_PROVIDER_SITE_OTHER): Payer: Self-pay | Admitting: Family Medicine

## 2016-04-16 DIAGNOSIS — R05 Cough: Secondary | ICD-10-CM

## 2016-04-16 DIAGNOSIS — M48061 Spinal stenosis, lumbar region without neurogenic claudication: Secondary | ICD-10-CM

## 2016-04-16 DIAGNOSIS — Z6829 Body mass index (BMI) 29.0-29.9, adult: Secondary | ICD-10-CM

## 2016-04-16 DIAGNOSIS — R059 Cough, unspecified: Secondary | ICD-10-CM

## 2016-04-16 DIAGNOSIS — M7918 Myalgia, other site: Secondary | ICD-10-CM

## 2016-04-16 DIAGNOSIS — M791 Myalgia: Secondary | ICD-10-CM

## 2016-04-16 MED ORDER — HYDROCODONE-ACETAMINOPHEN 10-325 MG OR TABS
1.0000 | ORAL_TABLET | Freq: Two times a day (BID) | ORAL | 0 refills | Status: DC | PRN
Start: 2016-04-16 — End: 2016-05-26

## 2016-04-16 MED ORDER — ALBUTEROL SULFATE HFA 108 (90 BASE) MCG/ACT IN AERS
2.0000 | INHALATION_SPRAY | RESPIRATORY_TRACT | 12 refills | Status: AC | PRN
Start: 2016-04-16 — End: ?

## 2016-04-16 MED ORDER — ALBUTEROL SULFATE HFA 108 (90 BASE) MCG/ACT IN AERS
2.0000 | INHALATION_SPRAY | RESPIRATORY_TRACT | 12 refills | Status: DC | PRN
Start: 2016-04-16 — End: 2016-04-16

## 2016-04-16 NOTE — Progress Notes (Signed)
S:  Pt is her for refill of Pain meds  And needs a new prescription for albuterolalbuterol;    Did see Neurosurgeon re abnormalities seen on MRI   I think he is an ideal candidate for continued management of his pain with nonsurgical management including physical therapy and epidural steroid injections.  He will follow up with me on a when necessary basis.    Pt had Hip injection by Dr/ Amie Critchley-- helped a bit   Now pain in general is not quite as intense as it used to be--not sure if this is realted to the hydrocodone he takes in the am  only takes hydrocodone in am--very rarely 2 in a day  Seems to work pretty well--takes when he wakes and goes to BR between 3 and 6 am  Pain toned down when he gets up for the day  Has noted some C--taking miralax to deal with this  He does not want to have epidural steroids--wants to try the hip injections again    Re PULM issues:  Breathing issues and lack of E  Not much change  Uses Albuterol--about 3 times a day  Also on spiriva (tiotropium)  symbicort (budesonide/formoterol)      O:  Pt alert and cooperative and in NAD.  BP 110/65   Pulse 100   Temp 96.8 F (36 C) (Temporal)   Resp 20   Wt 211 lb (95.7 kg)   SpO2 93%   BMI 29.85 kg/m    facial telangiectasias  Chest: slightly decreased AE bilat though present to bases no crackles or wheezes heard    A/P:    (M48.061) Spinal stenosis of lumbar region, unspecified whether neurogenic claudication present  Plan: Hydrocodone-Acetaminophen 10-325 MG Oral Tab,         [place]        As he is not interested in epidural steroids at this time and as he says that he thinks that the hip injection might have helped at least a bit, will have=him seen again by Dr. Amie Critchley    (M79.1) Left buttock pain  Plan: Hydrocodone-Acetaminophen 10-325 MG Oral Tab,         [facility]            (R05) Cough  Plan: Albuterol Sulfate HFA 108 (90 Base) MCG/ACT         Inhalation Aero Soln,  DISCONTINUED: Albuterol         Sulfate HFA 108 (90 Base) MCG/ACT Inhalation         Aero Soln        Will refill pum meds as below  He has follow up with pulmonology in late winter 2018    rtc for wellness exam.    Theresia Bough, MD

## 2016-04-23 ENCOUNTER — Other Ambulatory Visit (INDEPENDENT_AMBULATORY_CARE_PROVIDER_SITE_OTHER): Payer: Self-pay | Admitting: Family Medicine

## 2016-04-23 DIAGNOSIS — K219 Gastro-esophageal reflux disease without esophagitis: Secondary | ICD-10-CM

## 2016-04-24 MED ORDER — PANTOPRAZOLE SODIUM 40 MG OR TBEC
DELAYED_RELEASE_TABLET | ORAL | 1 refills | Status: AC
Start: 2016-04-24 — End: ?

## 2016-05-04 ENCOUNTER — Encounter (INDEPENDENT_AMBULATORY_CARE_PROVIDER_SITE_OTHER): Payer: No Typology Code available for payment source | Admitting: Family Practice

## 2016-05-05 ENCOUNTER — Encounter (HOSPITAL_BASED_OUTPATIENT_CLINIC_OR_DEPARTMENT_OTHER): Payer: Self-pay | Admitting: Family Medicine

## 2016-05-05 ENCOUNTER — Ambulatory Visit: Payer: No Typology Code available for payment source | Attending: Family Medicine | Admitting: Family Medicine

## 2016-05-05 VITALS — BP 93/60 | HR 89 | Ht 70.51 in | Wt 214.0 lb

## 2016-05-05 DIAGNOSIS — M5432 Sciatica, left side: Secondary | ICD-10-CM | POA: Insufficient documentation

## 2016-05-05 MED ORDER — DEXAMETHASONE SODIUM PHOSPHATE 4 MG/ML IJ SOLN
4.0000 mg | Freq: Once | INTRAMUSCULAR | Status: AC
Start: 2016-05-05 — End: 2016-05-05
  Administered 2016-05-05: 4 mg via INTRALESIONAL

## 2016-05-05 NOTE — Progress Notes (Signed)
Tracy Conway presents today for a previously discussed and agreed upon hydrodissection of the left sciatic nerve. This will be his second injection. He did obtain a few weeks of moderate relief last time. I discussed repeating an injection with Dr. Satira Sark, as Mr. Kumpe was requesting a repeat. We will try this today.    Hydrodissection of the left sciatic nerve    Informed consent  The risks and benefits of the procedure were discussed. Potential benefits include decreased pain, reduced inflammation, and improved function. The risks include, but are not limited to, damage to surrounding structures, a small risk of bleeding, a reaction to the medicine, allergy, infection, skin atrophy, or hypopigmentation. The patient was given an opportunity to ask questions. Both verbal and written consent were obtained before the procedure. The patient agreed to proceed, and signed consent was scanned into his chart.    Procedure  Final verification of correct patient identity, procedure, site/side and instrument was performed prior to start of procedure: YES    With the patient in the near-prone position (patient cannot tolerate full prone due to breathing). A pre-scan ultrasound was performed to confirm the sciatic nerve location and the surrounding structures and vasculature; a linear probe was used. The injection site was marked with a marking pen. The area was then cleansed with chlorhexidine three times. The ultrasound probes were also sterilely prepped. A small amount of 1% lidocaine was infiltrated in the overlying skin and along the planned injection path using a 27-gauge needle and continual ultrasound guidance. Then, a mixture of 18 cc normal saline, 1 cc 1% lidocaine, and 1 cc of 4 mg/cc dexamethasone, lot number [id number] and expiration 12/2016, was then injected around the sciatic nerve using a 22 gauge spinal needle. The injectate could be seen separating the sciatic nerve from around the surrounding tissue.  The needle was withdrawn, the area cleansed, and a sterile bandage placed. Good hemostasis was noted. The patient tolerated the procedure well. There were no complications.    Of note, this was a technically less difficult procedure than his last procedure. He reported 100% pain relief after the procedure. This still may be coming from his lumbar spine as the primary source of his pain as Dr. Sherrye Payor also believes, though I think this may offer relief yet again. If partial/temporary, I will have him see our physiatry team to get an opinion about an ESI.    Aftercare instructions  Post-procedural care and return precautions, including infection warning signs, were reviewed with the patient prior to them leaving clinic. They verbalized understanding. All questions were answered.    Follow-up:  By phone/[facility] in 2 weeks.    --  [name] Amie Critchley, MD, CAQSM  Assistant Professor  [place] Department of [facility] Medicine  Sports Medicine, Residency sections

## 2016-05-15 ENCOUNTER — Other Ambulatory Visit (INDEPENDENT_AMBULATORY_CARE_PROVIDER_SITE_OTHER): Payer: Self-pay | Admitting: Family Medicine

## 2016-05-15 DIAGNOSIS — R21 Rash and other nonspecific skin eruption: Secondary | ICD-10-CM

## 2016-05-15 DIAGNOSIS — I10 Essential (primary) hypertension: Secondary | ICD-10-CM

## 2016-05-15 NOTE — Telephone Encounter (Signed)
Routing to RAC.

## 2016-05-15 NOTE — Telephone Encounter (Signed)
From: Zoe Lan  Sent: 05/15/2016 11:12 AM PST  Subject: Medication Renewal Request    Tracy Conway would like a refill of the following medications:     Triamcinolone Acetonide 0.1 % External Ointment Theresia Bough, MD]    Preferred pharmacy: [facility] [phone]

## 2016-05-18 MED ORDER — TRIAMCINOLONE ACETONIDE 0.1 % EX OINT
1.0000 | TOPICAL_OINTMENT | Freq: Two times a day (BID) | CUTANEOUS | 0 refills | Status: AC
Start: 2016-05-18 — End: ?

## 2016-05-18 MED ORDER — METOPROLOL SUCCINATE ER 100 MG OR TB24
EXTENDED_RELEASE_TABLET | ORAL | 0 refills | Status: DC
Start: 2016-05-18 — End: 2016-05-26

## 2016-05-26 ENCOUNTER — Ambulatory Visit (INDEPENDENT_AMBULATORY_CARE_PROVIDER_SITE_OTHER): Payer: No Typology Code available for payment source | Admitting: Family Medicine

## 2016-05-26 ENCOUNTER — Encounter (INDEPENDENT_AMBULATORY_CARE_PROVIDER_SITE_OTHER): Payer: Self-pay | Admitting: Family Medicine

## 2016-05-26 VITALS — BP 129/80 | HR 77 | Temp 97.8°F | Resp 22 | Ht 70.51 in | Wt 211.4 lb

## 2016-05-26 DIAGNOSIS — M48061 Spinal stenosis, lumbar region without neurogenic claudication: Secondary | ICD-10-CM

## 2016-05-26 DIAGNOSIS — M7918 Myalgia, other site: Secondary | ICD-10-CM

## 2016-05-26 DIAGNOSIS — R3912 Poor urinary stream: Secondary | ICD-10-CM

## 2016-05-26 DIAGNOSIS — Z0000 Encounter for general adult medical examination without abnormal findings: Secondary | ICD-10-CM

## 2016-05-26 DIAGNOSIS — Z6829 Body mass index (BMI) 29.0-29.9, adult: Secondary | ICD-10-CM

## 2016-05-26 DIAGNOSIS — I714 Abdominal aortic aneurysm, without rupture, unspecified: Secondary | ICD-10-CM

## 2016-05-26 DIAGNOSIS — R0689 Other abnormalities of breathing: Secondary | ICD-10-CM

## 2016-05-26 DIAGNOSIS — M519 Unspecified thoracic, thoracolumbar and lumbosacral intervertebral disc disorder: Secondary | ICD-10-CM

## 2016-05-26 DIAGNOSIS — I10 Essential (primary) hypertension: Secondary | ICD-10-CM

## 2016-05-26 DIAGNOSIS — R0600 Dyspnea, unspecified: Secondary | ICD-10-CM

## 2016-05-26 DIAGNOSIS — K219 Gastro-esophageal reflux disease without esophagitis: Secondary | ICD-10-CM

## 2016-05-26 DIAGNOSIS — M791 Myalgia: Secondary | ICD-10-CM

## 2016-05-26 DIAGNOSIS — N401 Enlarged prostate with lower urinary tract symptoms: Secondary | ICD-10-CM

## 2016-05-26 DIAGNOSIS — I25758 Atherosclerosis of native coronary artery of transplanted heart with other forms of angina pectoris: Secondary | ICD-10-CM

## 2016-05-26 LAB — COMPREHENSIVE METABOLIC PANEL
ALT (GPT): 24 U/L (ref 10–48)
AST (GOT): 27 U/L (ref 9–38)
Albumin: 4.2 g/dL (ref 3.5–5.2)
Alkaline Phosphatase (Total): 63 U/L (ref 52–227)
Anion Gap: 10 (ref 4–12)
Bilirubin (Total): 1 mg/dL (ref 0.2–1.3)
Calcium: 9.9 mg/dL (ref 8.9–10.2)
Carbon Dioxide, Total: 32 meq/L (ref 22–32)
Chloride: 99 meq/L (ref 98–108)
Creatinine: 1.57 mg/dL — ABNORMAL HIGH (ref 0.51–1.18)
GFR, Calc, African American: 52 mL/min/{1.73_m2} — ABNORMAL LOW (ref >59)
GFR, Calc, European American: 43 mL/min/{1.73_m2} — ABNORMAL LOW (ref >59)
Glucose: 135 mg/dL — ABNORMAL HIGH (ref 62–125)
Potassium: 4.5 meq/L (ref 3.6–5.2)
Protein (Total): 6.9 g/dL (ref 6.0–8.2)
Sodium: 141 meq/L (ref 135–145)
Urea Nitrogen: 24 mg/dL — ABNORMAL HIGH (ref 8–21)

## 2016-05-26 LAB — CBC (HEMOGRAM)
Hematocrit: 38 % (ref 38–50)
Hemoglobin: 12.5 g/dL — ABNORMAL LOW (ref 13.0–18.0)
MCH: 31.3 pg (ref 27.3–33.6)
MCHC: 33.1 g/dL (ref 32.2–36.5)
MCV: 95 fL (ref 81–98)
Platelet Count: 153 10*3/uL (ref 150–400)
RBC: 4 10*6/uL — ABNORMAL LOW (ref 4.40–5.60)
RDW-CV: 14.9 % — ABNORMAL HIGH (ref 11.6–14.4)
WBC: 5.59 10*3/uL (ref 4.3–10.0)

## 2016-05-26 LAB — MAGNESIUM: Magnesium: 1.6 mg/dL — ABNORMAL LOW (ref 1.8–2.4)

## 2016-05-26 MED ORDER — FUROSEMIDE 20 MG OR TABS
20.0000 mg | ORAL_TABLET | Freq: Every day | ORAL | 3 refills | Status: AC
Start: 2016-05-26 — End: ?

## 2016-05-26 MED ORDER — TAMSULOSIN HCL 0.4 MG OR CAPS
0.4000 mg | ORAL_CAPSULE | Freq: Every evening | ORAL | 2 refills | Status: AC
Start: 2016-05-26 — End: ?

## 2016-05-26 MED ORDER — HYDROCODONE-ACETAMINOPHEN 10-325 MG OR TABS
1.0000 | ORAL_TABLET | Freq: Two times a day (BID) | ORAL | 0 refills | Status: DC | PRN
Start: 2016-05-26 — End: 2016-05-26

## 2016-05-26 MED ORDER — HYDROCODONE-ACETAMINOPHEN 10-325 MG OR TABS
1.0000 | ORAL_TABLET | Freq: Two times a day (BID) | ORAL | 0 refills | Status: DC | PRN
Start: 2016-05-26 — End: 2016-06-23

## 2016-05-26 MED ORDER — METOPROLOL SUCCINATE ER 100 MG OR TB24
100.0000 mg | EXTENDED_RELEASE_TABLET | Freq: Every day | ORAL | 3 refills | Status: DC
Start: 2016-05-26 — End: 2016-07-27

## 2016-05-26 MED ORDER — ATORVASTATIN CALCIUM 80 MG OR TABS
80.0000 mg | ORAL_TABLET | Freq: Every day | ORAL | 3 refills | Status: AC
Start: 2016-05-26 — End: ?

## 2016-05-26 NOTE — Patient Instructions (Addendum)
Ask your pharmacist about the shingles vaccine    Leading a Healthy Life  Six tips to help improve your health and wellness     This explains how these 6 basic guidelines may improve your health and wellness:   . Eat well to give your body the energy it needs.   . Stay or get active.   . A healthy mind is part of a healthy body.   . Practice safe living habits.   [place] Kitchen Keep your mind and body free of harmful drugs and alcohol.   . Get regular health care.     Tip #1: Eat well to give your body the energy it needs.   Your body needs nutritious foods to stay strong and healthy.   Here are some general eating guidelines:   . Have 2 servings of fish or other seafood 2 times a week (1 serving = 4 ounces).   . If you eat dairy products, choose low-fat (1%) or nonfat ones.   . If you eat meat, cut down on the amount. Replace it with plant-based foods such as beans, whole grains, fruits and vegetables, and nuts and seeds.   . Have less than 1,500 mg of sodium (salt) a day.   . Cut down on "junk food" like alcohol, fatty foods, chips, candy, and other sweets.     Tip #2: Stay or get active.   Exercise for at least 30 minutes at a time, 3 times a week. Regular physical activity can help you:   . Live longer and feel better   . Be stronger and more flexible   . Build strong bones   . Prevent depression   . Strengthen your immune system   . Maintain a healthy body weight     Tip #3: Remember: A healthy mind is part of a healthy body.   A good state of mind can help you make healthy choices. Here are a few tips for keeping your mind healthy:   . Reduce stress in your life.   . Make some time every day for things that are fun.   . Get enough sleep. Lack of sleep reduces how well you can concentrate, increases mood swings, and raises your risk of having a car accident.   . Ask your health care provider for help if you feel depressed or anxious for more than several days in a row.     Tip #4: Practice safe living habits.   Accidents  and Injuries      . Accidents and injuries are the 5th leading cause of death in the [place]   . Accidents in the home cause thousands of permanent injuries every year.     The most common accidents are fires, falls, and drowning. To help yourself and your family stay safe:   [place] Kitchen Install smoke detectors on each floor of your home.   . Make sure everyone in your family knows how to swim  . Stay safe on the road:  o Wear a seatbelt.   o Do not ride with someone who has been drinking or taking drugs.   o Do not speak on a cell phone or send, read, or write text messages while you are driving.   o Wear a helmet when you ride a bicycle or motorcycle.   o Get enough sleep at night, and do not drive when you are tired.     Hand Hygiene   Protect yourself from germs by washing  your hands often. Always wash your hands:   . After you change a diaper or use the toilet   . Before you start and after you finish preparing food     Tip #5: Keep your mind and body free of harmful drugs and alcohol.   Tobacco causes more health problems than any other substance. These problems include lung disease, heart disease, and many types of cancer. The nicotine in tobacco is the most addictive and widely used drug.   Too much alcohol can cause damage to your liver, heart, brain, bones, and other body tissues. Being under the influence of alcohol also increases your chance of being injured in an accident.    Alcohol can cause fetal alcohol syndrome in your children if you drink regularly when you are pregnant.   Street drugs, like marijuana, cocaine, methamphetamine, heroin, or pain pills not prescribed by your doctor can harm your health. They may be mixed with harmful substances, and using them can cause people to put themselves in dangerous situations.     Tip #6: Get regular health care.   Many people think they need to see the doctor only when they are sick. But, health care providers can also help you stay healthy.   . Find a health care  provider who works with you to manage your health.   . Ask your health care provider what diseases you are at risk for. Learn what you can do to prevent or control them.   . Get yourself and your family immunized against life-threatening diseases.

## 2016-05-26 NOTE — Progress Notes (Signed)
Reason for Visit: Medicare Wellness Exam    Refills? YES  Referral? NO  Letter or Form? NO  Lab Results? NO    HEALTH MAINTENANCE:  Has the patient had this done since their last visit?  Cervical screening/PAP: N/A  Mammo: N/A  Colon Screen: N/A    Have you seen a specialist since your last visit: Dr. Amie Critchley - Sports Medicine    Vaccines Due? No    HM Due:   Health Maintenance   Topic Date Due   . Colorectal Cancer Screening (Colonoscopy)  07/14/2017   . Tetanus Vaccine  05/20/2020   . Lipid Disorders Screening  01/05/2021   . Zoster Vaccine  Completed   . Pneumococcal Vaccine  Completed   . Influenza Vaccine  Completed       PCP Verified?  Yes, Dr. Celene Squibb [facility] VISIT     Tracy Conway presents for a subsequent Annual Wellness Visit.    Rooming activities:  Height and weight measured: YES  PHQ-2 responses entered into screening section: YES  PHQ-9 given if PHQ-2 score is >0:YES           HEALTH RISK ASSESSMENT:     Current providers and suppliers  X See Care Team Section  X See also EHR Encounters for [place] Providers involved in care  X Other providers and suppliers outside [facility]:      [place]:  How do you rate your overall health in the past 4 weeks?    not known  Can you manage your health problems?  YES  Due to any health problems, do you need the help of another person with your personal care needs such as eating, bathing,  dressing or getting around the house?  NO      DEPRESSION SCREEN and PSYCHOSOCIAL HEALTH:    PHQ-2 Total Score:         Have your feelings caused you distress or interfered with your ability to get along socially with family or friends? not at all   In the past 2 weeks, have you felt stress over health, finances, relationships or work?  several days  Do you often get the emotional support you need? More than half the days  In the past 2 weeks, how much body pain do you have? Nearly everyday  In the past 2 weeks, how much fatigue do you  have? more than half of the days      [place]:  How much alcohol do you drink weekly?  2-5 drinks per week  Is your diet well-balanced/healthy, including protein, fiber, vegetables, and fruits? YES   Do you exercise regularly? NO        Type of exercise None         Frequency of exercise Never  Do you always use your seat belt in the car? YES  How would you describe the condition of your mouth and teeth ? including false teeth or dentures?  good  Are you sexually active? NO  Do you find yourself having trouble hearing people speak?  YES  Do you wear a hearing aid/device? Yes  Do you have a fire extinguisher in your home?  YES  Do you have a smoke detector?  YES        ACTIVITIES OF DAILY LIVING:  In your present state of health how much difficulty do you have with the following activities:  Preparing food and eating: 2 Moderate impairment (problem < 50% time, intensity interfers in day to day life, happens occasionally over the last 30 days.)       Bathing yourself: 2 Moderate impairment (problem < 50% time, intensity interfers in day to day life, happens occasionally over the last 30 days.)       Getting dressed: 1 Mild impairment (problem < 25% time, intensity tolerable, happens rarely over the last 30 days.)       Using the toilet: 0 No impairment (the person has no problem)       Moving around from place to place: 1 Mild impairment (problem < 25% time, intensity tolerable, happens rarely over the last 30 days.)    In the past year have you fallen or had a near fall? No  Do you feel safe in your home environment? YES     INSTRUMENTAL ACTIVITIES OF DAILY LIVING:  In your present state of health how much difficulty do you have with the following activities?       Shopping: 1 Mild impairment (problem < 25% time, intensity tolerable, happens rarely over the last 30 days.)       Using the telephone: 0 No impairment (the person has no problem)       Housekeeping: 0 No impairment (the person has no  problem)       Laundry: 1 Mild impairment (problem < 25% time, intensity tolerable, happens rarely over the last 30 days.)       Driving or using transportation (bus, taxi): 0 No impairment (the person has no problem)       Handling your own finances: 0 No impairment (the person has no problem)       Managing your own medications: 0 No impairment (the person has no problem)     SIGNS OF COGNITIVE IMPAIRMENT:   Patient report? No  Concerns raised by family members, friends, caretakers or others? No     CARDIAC RISK FACTORS:  Smoker: No  Obesity: No  Diabetic: No  Known heart disease: Yes  Family history of heart disease: No  Sedentary lifestyle: Yes  Hyperlipidemia: No     Patient's questionnaire responses copied into template above by Vanetta Mulders, CMA

## 2016-05-26 NOTE — Progress Notes (Signed)
S:  Pt is here for Wellness visit    Still has a lot of pain  The past injection by Dr. Amie Critchley made the pain in his hip worse unfortunately  If he takes 2 hydromorphones-- this helps  Pain is so severe when he gets up in the morning--breaks out in a sweat    He was seen at [facility] by a neurosurgeon who recommended epidural steroid injection -as per Dr. Sherrye Payor NS  Regardless, none of the findings on his MRI would respond readily to minimally invasive surgery.  Probably the surgery with the best chance of providing him with relief of his pain would be an L4 5 decompression and arthrodesis, but the treatment might lead to side effects just as severe as his pain profile.  I think he is an ideal candidate for continued management of his pain with nonsurgical management including physical therapy and epidural steroid injections.  He will follow up with me on a when necessary basis.    He does have an appointment with a Urologist at the end of this month about his prostate    Due for CTA for the thoracic aneurysm sept 2018  Had CT abdo in 2014 for abdominal aortic aneurysm which measured 3.1 centimeters--due in sept 2018 also    Taking baby asa once daily  On atorvastatin    Seeing eye doc next week.    Recently had steroid pills for cough--this helped    Still struggling with alcohol--has good days and less good days.    I asked him about falls which he denied    Patient Active Problem List    Diagnosis Date Noted   . Falls [phone]] 10/26/2013     Inpatient evaluation September 2014, felt to have micturition syncope.  7/15 -- orthostatic hypotension sxs, BPs in afternoon in mid to high 90s to low [id number] systolic, [id number] to 123456 diastolic. D/Ced Nifedipine.     . Coronary artery disease involving native coronary artery of native heart with angina pectoris ([place]) [I25.119] 12/30/2010     Non-ST elevation myocardial infarction., July 18,2012  LAD drug-eluting stent nov, 2012. Clopidogrel         . Malignant neoplasm  of prostate ([place]) [C61] 08/14/2010     Diagnosed 07/2010. Sees [name] (urol) and [name] (rad onc). Saw [name], [name]--[facility] [place]--palladium seed implants June 2012  Mr. Sweger has an intermediate risk prostate cancer on the basis of a Gleason 7 histology       . Lumbago [M54.5] 12/19/2009     March 21, 2012, underwent L4-L5 bilateral laminectomies and partial medial facetectomies, as well as an L4-L5 right-sided synovial cyst resection. He says since surgery he feels better       . Routine general medical examination at a health care facility [Z00.00] 05/15/2009     06/07/2012  Td/Tdap (q 10 yr):  2004, 2012  Pneumococcal (65 yoa and high risk x1):  2010, 2004  Influenza vaccine (q 1 yr):  2010 nov, 2011, 2012, 2013  Shingles: discussed--should get at his local pharmacy  Blood pressure:  Excellent 138/64  121/72  Cholesterol (q 5 yr begin age 43):on simvastatin oct 2012: excellent--will repeat  AAA screen (x1, male age 65, if ever smoked):  Checked at [place]-- abdo and thoracic CT--aug 2012: stable ascending aortic aneurysm  Colorectal cancer: ct colonoscopy done 2008, repeat due 2013--done April 2013: neg   PSA:  Sees urology for fu prostate ca. Has had  palladium seed implants  Tobacco:  NS x 11 yrs, 50 pack yr hx  Exercise:  Reg--treadmill x 20-30 mins then weight machines--every other day! Has not been back to the gym because of the back pain  Alcohol /Drugs:  None in 26 yrs--relapse jan 2014--did inpatient treatment--now none in 37 days!  Sexual practice/contraception:  Not an issue  Advanced directives:  Does have living will. DPOA  done       . Generalized osteoarthrosis, unspecified site [M15.9] 01/07/2007     Has seen orthopedics--cortisone shot to L knee helped.  L knee replacement 2008     . Diverticulitis of colon (without mention of hemorrhage)(562.11) [K57.32] 01/07/2007     Hospitalized at [facility] May 2008     . Tobacco use disorder [F17.200] 12/24/2005     Quit  2000.  50 pack year history  EtOH--quit 1986.     Marland Kitchen Unspecified hearing loss [H91.90] 12/24/2005     Bilat hearing aids.     . Benign neoplasm of colon [D12.6] 12/24/2005     Tubular adenoma. Dx'ed flex sig 2000--> colonoscopy 2000: N.  F/U 2003: mild diverticulosis, tubular adenoma.  F/U due 2008 with CT colonography.--done 7/08: ess. N.  Next due 2013--done 2014, no polyps seen. Due 2019       . Panlobular emphysema ([place]) [J43.1] 12/24/2005     05/2013: started by pulm on home O2  Emphysema: an FEV1 which is approximately at 33% of predicted    04/2010--Pulm: Chronic obstructive pulmonary disease, severe to very severe. I explained to him that continuing on the Spiriva as well as the Serevent was likely good therapy.I further told him that if he should have recurrent exacerbations within the course of the year that I would consider adding an inhaled corticosteroid at that point in time. He does not need oxygen at this point in time. He remains a nonsmoker. He is already engaged in a pulmonary rehabilitation.    Today I described to him how overall lung volume reduction surgery had no significant benefit on mortality. Subgroup analysis did indicate that those patients, who had upper lobe predominant disease with low exercise capacity were the most likely to benefit. Given the fact that he actually still has reasonably good functional status and is getting around and going to the gym and working out, it is not clear that he would be in the group that would most likely benefit, although, to safe for certain, we need to get a cardiopulmonary exercise test. At this point in time, he is feeling good with his chronic obstructive pulmonary disease therapy and is not interested in pursuing lung volume reduction surgery.    05/20/2010 CT angio: Addendum: Increased scarring in the right lung base since the prior   exam including the interval development of a new nodule. The   appearance suggests that the patient has had a  recent infection of   the right lower lobe has resolved and since the prior scan. However   recommend 3 months followup of the right lower lobe lesion given that   the patient is high risk with extensive paraseptal emphysema.     08/03/11 CT Chest  1. New indeterminate nodules in the lower lobes bilaterally, the largest   nodule measures 10 x 12 mm in the paraspinal location in the right lower lobe.  Possible considerations include infectious/inflammatory and less likely   metastasis.   2. Bilateral upper lobe predominant moderate centrilobular and paraseptal   emphysema.  3. Unchanged dilated main pulmonary artery measuring 32 mm may indicate   pulmonary arterial hypertension.   4. Aortic aneurysm involving the ascending aorta at the level of the main   pulmonary artery measuring 47 mm.     10/2011:   Impression:  1. Previous opacities resolved. New focus of small airways inflammation in the  RUL. No worrisome nodule or opacity for malignancy. If followup of the new   airways inflammation is desired a 12 month study will be adequate.  2. Stable fibrosis and peripheral calcification in the RLL, which raises the   possibility of chronic aspiration.                  . Thoracic aneurysm without mention of rupture [I71.2] 12/24/2005     A. CT 12/2014:   Aneurysmal dilatation of the ascending aorta is again identified measuring up   to 4.6 cm at the level of the main pulmonary artery, previously 4.7 cm in   2013.  B.  CTA 01/06/16:  Ascending aortic dimension 4.5 cm. Yearly follow up       . AAA (abdominal aortic aneurysm) ([place]) [I71.4] 12/24/2005     Father also had AAA. Borderline, seen on CT 8/06.  Also has thoracic aneurysm     . Ventricular septal defect [Q21.0] 12/24/2005     Small muscular VSD--NEEDS SBE PROPHYLAXIS. (Clindamycin 600 mg prior to dental visits.)     . Hypertrophy of prostate without urinary obstruction and other lower urinary tract symptoms (LUTS) [N40.0] 12/24/2005   . Chronic Kidney Disease -- Stage  III [N25.89] 07/22/2005     Creat ~ 1.8. Normal albuminuria (8/08)  Likely 2/2 HTN, poss component from hx obstructing nephrolithiasis.  8/08 Renal: referred to Uro for 82mm stone, nonobstructing. Also doing w/u for stone type, poss target prevention.     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 HYPERTENSION NOS(aka HTN) [I10] 03/29/2002   . ECZEMA  [L25.9] 12/24/2005     Lower extremities in particular.     . Pure hypercholesterolemia [E78.00] 03/29/2002   . Lumbar radiculopathy [M54.16] 03/18/2016   . 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 ventricular polyps [J38.7] 10/16/2014   . Dysphonia [R49.0] 10/16/2014   . Pneumothorax on right [J93.9] 01/22/2014   . Right rib fracture [S22.31XA] 01/22/2014   . Alcohol use disorder, moderate, in sustained remission [F10.21]      Had been in remission for many years but recently relapsed. Goes to [place] very regularly       . Carotid stenosis [I65.29]             . Major depressive disorder with single episode, in full remission ([place]) [F32.5] 11/10/2012   . Lumbar stenosis with neurogenic claudication [M48.062] 02/05/2012     March 21, 2012, underwent L4-L5 bilateral laminectomies and partial medial facetectomies, as well as an L4-L5 right-sided synovial cyst resection. He says since surgery he feels better       . ERECTILE DYSFUNCTION [N52.9] 12/24/2005   . Calculus of kidney [N20.0] 12/24/2005     Lithotripsy 1988  Stone panel in 0ct 09: high oxalate. Given low oxalae diet by Dr. Princella [name], nephrology in 2009.         Outpatient Medications Prior to Visit   Medication Sig Dispense Refill   . Acetaminophen 500 MG  Oral Tab Take 1-2 tablets three times daily for left hip pain. Do not exceed 3000 mg of acetaminophen in a day. (Patient not taking: Reported on 05/26/2016) 100 tablet 2   . Albuterol Sulfate HFA 108 (90 Base) MCG/ACT Inhalation  Aero Soln Inhale 2 puffs by mouth every 4 hours as needed for shortness of breath/wheezing. For wheezing and/or cough. 1 Inhaler 12   . Aspirin 81 MG Oral Tab 1 tablet by mouth once daily     . Atorvastatin Calcium (LIPITOR) 80 MG Oral Tab Take 1 tablet (80 mg) by mouth daily. 90 tablet 3   . Carbamide Peroxide 6.5 % Otic Solution Place 5-10 drops into right ear 2 times a day. 15 mL 0   . Cyanocobalamin (B-12) 100 MCG Oral Tab Take 100 mcg by mouth.     . Furosemide 20 MG Oral Tab Take 1 tablet (20 mg) by mouth daily. 90 tablet 3   . Hydrocodone-Acetaminophen 10-325 MG Oral Tab Take 1 tablet by mouth 2 times a day as needed for pain. For pain. 42 tablet 0   . Metoprolol Succinate ER 100 MG Oral TABLET SR 24 HR TAKE 1 TABLET DAILY 90 tablet 0   . Multiple Vitamins-Minerals (MULTIVITAMIN OR) Once daily     . NIFEdipine ER 30 MG Oral TABLET SR 24 HR Take 1 tablet (30 mg) by mouth daily. 90 tablet 1   . Pantoprazole Sodium 40 MG Oral Tab EC TAKE 1 TABLET DAILY 90 tablet 1   . Polyethylene Glycol 3350 (MIRALAX) Oral Powder Take 17 g by mouth.     . SPIRIVA HANDIHALER 18 MCG Inhalation Cap INHALE THE CONTENTS OF 1 CAPSULE DAILY, AFTER BREATHING OUT, INHALE A SECOND TIME 90 capsule 1   . SYMBICORT 160-4.5 MCG/ACT Inhalation Aerosol USE 2 INHALATIONS EVERY 12 HOURS 30.6 g 2   . Tamsulosin HCl 0.4 MG Oral Cap TAKE 1 CAPSULE AT BEDTIME 90 capsule 2   . Triamcinolone Acetonide 0.1 % External Ointment Apply 1 application topically 2 times a day. Apply to affected areas bid 80 g 0     No facility-administered medications prior to visit.      O:  Pt alert and cooperative and in NAD.  BP 129/80   Pulse 77   Temp 97.8 F (36.6 C) (Temporal)   Resp 22   Ht 5' 10.51" (1.791 m)   Wt 211 lb 6.4 oz (95.9 kg)   SpO2 95%   BMI 29.89 kg/m   Auscultation of the chest revealed fairly significantly decreased air entry bilaterally but no crackles or wheezes were heard  Heart sounds were quiet  Abdo: Unremarkable  Examination of the  skin on the chest reveals considerable bruising over the anterior chest wall--when I asked him about this he did admit that he has had some falls, I am not sure if this was in relation to alcohol intake    A/P:  Wellness visit  We reviewed his past medical history and the screening tests that he is due for  Essentially he is due for repeat CTs to follow his thoracic and abdominal aortic aneurysms  General counseling re-healthy lifestyle  He should talk to his pharmacist about the shingles vaccine--the shingrix vaccine would be best  I wish him well with his goals re-EtOH    (Z00.00) Routine general medical examination at a health care facility  (primary encounter diagnosis)  Plan: as above    (I10) Unspecified essential hypertension  Plan: Metoprolol Succinate ER 100  MG Oral TABLET SR         24 HR, COMPREHENSIVE METABOLIC PANEL            (R06.00,  R06.89) Dyspnea and respiratory abnormalities  Plan: Furosemide 20 MG Oral Tab, CBC (HEMOGRAM)            (I71.4) Abdominal aortic aneurysm (AAA) without rupture (HCC)  Plan: Atorvastatin Calcium (LIPITOR) 80 MG Oral Tab            (I25.758) Atherosclerosis of native coronary artery of transplanted heart with stable angina pectoris (HCC)  Plan: Atorvastatin Calcium (LIPITOR) 80 MG Oral Tab            (M48.061) Spinal stenosis of lumbar region, unspecified whether neurogenic claudication present  Plan: Hydrocodone-Acetaminophen 10-325 MG Oral Tab,         REFERRAL TO PAIN RELIEF CLINIC, DISCONTINUED:         Hydrocodone-Acetaminophen 10-325 MG Oral Tab  I think it would be worthwhile for him to be seen in pain clinic for consideration of epidural steroids as per his neurosurgeon      (M79.1) Left buttock pain  Plan: Hydrocodone-Acetaminophen 10-325 MG Oral Tab,         REFERRAL TO PAIN RELIEF CLINIC, DISCONTINUED:         Hydrocodone-Acetaminophen 10-325 MG Oral Tab  I suggested that the maximum dose of this medication should be 1 tablet twice a day   he will return to  clinic monthly for refills              (N40.1,  R39.12) Benign prostatic hyperplasia with weak urinary stream  Plan: Tamsulosin HCl 0.4 MG Oral Cap            (K21.9) Gastroesophageal reflux disease without esophagitis  Plan: MAGNESIUM            (M51.9) Intervertebral disk disease  Plan: REFERRAL TO PAIN RELIEF CLINIC    Theresia Bough, MD

## 2016-05-31 ENCOUNTER — Encounter (INDEPENDENT_AMBULATORY_CARE_PROVIDER_SITE_OTHER): Payer: Self-pay | Admitting: Family Medicine

## 2016-06-01 NOTE — Telephone Encounter (Signed)
Generic response sent.    Routing to Dr. Satira Sark and Referral Pool - thank you!

## 2016-06-03 NOTE — Telephone Encounter (Signed)
I submitted a referral to [facility] clinic for consideration of lumbar epidural steroids. Pt would prefer to be seen at [facility] but it does not seem that they provide this service?  Thank you for your help in processing this referral    Katie--I am trying to route this to the referral pool--but I can't find it... (?) can you help? Thank you!

## 2016-06-03 NOTE — Telephone Encounter (Signed)
Unsure what pt is referring to.  Pt received second injection from Dr. Amie Critchley on 05/05/16:  Tracy Conway presents today for a previously discussed and agreed upon hydrodissection of the left sciaticnerve. This will be his second injection. He did obtain a few weeks of moderate relief last time. I discussed repeating an injection with Dr. Satira Sark, as Mr. Marien was requesting a repeat. We will try this today.    The referral was placed by Dr. Satira Sark for: Spinal stenosis of lumbar region, unspecified whether neurogenic claudication present    Does pt need to follow up with [facility]?  Please advise next steps for lumbar injection.  Thank you!

## 2016-06-08 ENCOUNTER — Encounter (HOSPITAL_BASED_OUTPATIENT_CLINIC_OR_DEPARTMENT_OTHER): Payer: Self-pay

## 2016-06-09 NOTE — Telephone Encounter (Signed)
There is [facility]. Re-directed the referral to their clinic. Notified the pt via eCare.

## 2016-06-13 ENCOUNTER — Encounter (INDEPENDENT_AMBULATORY_CARE_PROVIDER_SITE_OTHER): Payer: Self-pay | Admitting: Family Medicine

## 2016-06-15 NOTE — Telephone Encounter (Signed)
Generic response sent. Routing to Dr. Satira Sark to respond to pt's concerns regarding another evaluation

## 2016-06-23 ENCOUNTER — Ambulatory Visit (INDEPENDENT_AMBULATORY_CARE_PROVIDER_SITE_OTHER): Payer: No Typology Code available for payment source | Admitting: Family Medicine

## 2016-06-23 ENCOUNTER — Encounter (INDEPENDENT_AMBULATORY_CARE_PROVIDER_SITE_OTHER): Payer: Self-pay | Admitting: Family Medicine

## 2016-06-23 DIAGNOSIS — M48062 Spinal stenosis, lumbar region with neurogenic claudication: Secondary | ICD-10-CM

## 2016-06-23 MED ORDER — HYDROMORPHONE HCL 4 MG OR TABS
ORAL_TABLET | ORAL | 0 refills | Status: AC
Start: 2016-07-20 — End: ?

## 2016-06-23 MED ORDER — HYDROMORPHONE HCL 4 MG OR TABS
ORAL_TABLET | ORAL | 0 refills | Status: DC
Start: 2016-06-23 — End: 2016-06-23

## 2016-06-23 NOTE — Progress Notes (Signed)
S:  Pt is her for refill of pain meds.  Does have appt with pain clinic march for consideration of epidural steroids    He thinks that he had better results with hydromorphone in the past instead of hydrocodone    Reviewed labs:  Office Visit on 05/26/16   1. COMPREHENSIVE METABOLIC PANEL   Result Value Ref Range    Sodium 141 135 - 145 meq/L    Potassium 4.5 3.6 - 5.2 meq/L    Chloride 99 98 - 108 meq/L    Carbon Dioxide, Total 32 22 - 32 meq/L    Anion Gap 10 4 - 12    Glucose 135 (H) 62 - 125 mg/dL    Urea Nitrogen 24 (H) 8 - 21 mg/dL    Creatinine 1.57 (H) 0.51 - 1.18 mg/dL    Protein (Total) 6.9 6.0 - 8.2 g/dL    Albumin 4.2 3.5 - 5.2 g/dL    Bilirubin (Total) 1.0 0.2 - 1.3 mg/dL    Calcium 9.9 8.9 - 10.2 mg/dL    AST (GOT) 27 9 - 38 U/L    Alkaline Phosphatase (Total) 63 52 - 227 U/L    ALT (GPT) 24 10 - 48 U/L    GFR, Calc, European American 43 (L) >59 mL/min/[1.73_m2]    GFR, Calc, African American 52 (L) >59 mL/min/[1.73_m2]    GFR, Information       Calculated GFR in mL/min/1.73 m2 by MDRD equation.  Inaccurate with changing renal function.  See [email or web address]   2. CBC (HEMOGRAM)   Result Value Ref Range    WBC 5.59 4.3 - 10.0 10*3/uL    RBC 4.00 (L) 4.40 - 5.60 10*6/uL    Hemoglobin 12.5 (L) 13.0 - 18.0 g/dL    Hematocrit 38 38 - 50 %    MCV 95 81 - 98 fL    MCH 31.3 27.3 - 33.6 pg    MCHC 33.1 32.2 - 36.5 g/dL    Platelet Count 153 150 - 400 10*3/uL    RDW-CV 14.9 (H) 11.6 - 14.4 %   3. MAGNESIUM   Result Value Ref Range    Magnesium 1.6 (L) 1.8 - 2.4 mg/dL     O:  Pt alert and cooperative and in NAD.  BP 110/64   Pulse 96   Temp 96.8 F (36 C) (Temporal)   Resp 16   SpO2 94% Comment: 2L O2  Wearing O2 by nasal cannula.  Moves antalgically    A/P:  [id number]) Lumbar stenosis with neurogenic claudication  Plan: HYDROmorphone HCl 4 MG Oral Tab, DISCONTINUED:         HYDROmorphone HCl 4 MG Oral Tab        Will try this instead of the hydrocodone  The narcotics  are unlikely to give him complete relief.  Pain Clinic suggestions will be welcome.    Discussed that his magnesium is a bit low--suggested repleating that with low dose supplemental Mg    Asked him if he could stop the pantoprazole as GERD seems quiescent--he thinks he can--advised that he can take this med prn.    rtc one month    Theresia Bough, MD

## 2016-06-23 NOTE — Progress Notes (Signed)
Reason for Visit: follow up      Refills? NO  Referral? NO  Letter or Form? NO  Lab Results? NO    HEALTH MAINTENANCE:  Has the patient had this done since their last visit?  Cervical screening/PAP: na  Mammo: N/A  Colon Screen: utd    Have you seen a specialist since your last visit: Yes - Name, location, and date see epic    Vaccines Due? Unknown    HM Due:   Health Maintenance   Topic Date Due   . Depression Monitoring (PHQ-9)  05/02/2015   . Colorectal Cancer Screening (Colonoscopy)  07/14/2017   . Tetanus Vaccine  05/20/2020   . Lipid Disorders Screening  01/05/2021   . Zoster Vaccine  Completed   . Pneumococcal Vaccine  Completed   . Influenza Vaccine  Completed       PCP Verified?  Yes, tanner

## 2016-06-23 NOTE — Patient Instructions (Signed)
If you can get by with NO PANTOPRAZOLE for reflux--it would be good to stop that medicine--you can take it as needed however

## 2016-06-25 ENCOUNTER — Encounter (INDEPENDENT_AMBULATORY_CARE_PROVIDER_SITE_OTHER): Payer: Self-pay | Admitting: Family Medicine

## 2016-06-25 DIAGNOSIS — M5416 Radiculopathy, lumbar region: Secondary | ICD-10-CM

## 2016-06-25 DIAGNOSIS — M5440 Lumbago with sciatica, unspecified side: Secondary | ICD-10-CM

## 2016-06-25 DIAGNOSIS — G8929 Other chronic pain: Secondary | ICD-10-CM

## 2016-06-25 NOTE — Telephone Encounter (Signed)
Generic response sent to patient.    Routing to Dr. Tanner to advise on Rx concerns.

## 2016-06-29 ENCOUNTER — Other Ambulatory Visit (INDEPENDENT_AMBULATORY_CARE_PROVIDER_SITE_OTHER): Payer: Self-pay | Admitting: Family Medicine

## 2016-06-29 DIAGNOSIS — I10 Essential (primary) hypertension: Secondary | ICD-10-CM

## 2016-06-30 MED ORDER — NIFEDIPINE ER 30 MG OR TB24
EXTENDED_RELEASE_TABLET | ORAL | 1 refills | Status: AC
Start: 2016-06-30 — End: ?

## 2016-06-30 NOTE — Telephone Encounter (Addendum)
We can cancel the PT referral

## 2016-06-30 NOTE — Telephone Encounter (Signed)
Please see patients response regarding PT

## 2016-07-01 ENCOUNTER — Ambulatory Visit (HOSPITAL_BASED_OUTPATIENT_CLINIC_OR_DEPARTMENT_OTHER): Payer: No Typology Code available for payment source

## 2016-07-01 ENCOUNTER — Encounter (HOSPITAL_BASED_OUTPATIENT_CLINIC_OR_DEPARTMENT_OTHER): Payer: No Typology Code available for payment source | Admitting: Critical Care Medicine

## 2016-07-01 NOTE — Telephone Encounter (Signed)
Referral closed

## 2016-07-06 ENCOUNTER — Encounter (INDEPENDENT_AMBULATORY_CARE_PROVIDER_SITE_OTHER): Payer: Self-pay | Admitting: Family Medicine

## 2016-07-06 NOTE — Telephone Encounter (Signed)
Generic response sent to patient.    Routing to Dr. Satira Sark to advise. Will the pain clinic have access to the images the patient is requesting?

## 2016-07-14 ENCOUNTER — Telehealth (HOSPITAL_BASED_OUTPATIENT_CLINIC_OR_DEPARTMENT_OTHER): Payer: Self-pay

## 2016-07-14 NOTE — Telephone Encounter (Signed)
Patient reminder call,  complete Online Pain Tracker report prior to appointment arrival.     Advised patient,     You will be receiving an informational handout regarding Online Pain[employer], That'll be located in your New Patient Welcome Packet that's arriving in the mail. Online Pain Tracker reporting can take up to 30 minutes to complete. Also, if you are having computer/internet access issues; please arrive 45 minutes prior to the scheduled appointment time to utilize our clinic computer resources.     Pt confirmed understanding.      Provided call back number. PH: [phone]

## 2016-07-15 NOTE — Progress Notes (Signed)
[facility] CENTER FOR PAIN RELIEF CONSULTATION VISIT  07/16/2016   Virgina Norfolk  [id number]    REFERRED BY:   [place] [address]  PCP:  Theresia Bough, MD (General)  [address] / [place] [address]    REFERRED FOR: Evaluation of Chronic Left hip pain,  with the following specific issues to be addressed:  "82 year old male with significant lumbar disk disease and spinal stenosis, saw neurosurgery because of findings on recent MRI. The neurosurgeon's recommendation was as follows:  Regardless, none of the findings on his MRI would respond readily to minimally invasive surgery.  Probably the surgery with the best chance of providing him with relief of his pain would be an L4 5 decompression and arthrodesis, but the treatment might lead to side effects just as severe as his pain profile.  I think he is an ideal candidate for continued management of his pain with nonsurgical management including physical therapy and epidural steroid injections.  He will follow up with me on a when necessary basis.  Thank you for evaluating him for lumbar epidural steroids."    CHIEF COMPLAINT:   Chief Complaint   Patient presents with   . Hip Pain     left side       HISTORY OF PRESENT ILLNESS:   Arien Benincasa is a 82 year old male with a history of CAD (s/p LAD DES 02/2011), Prostate CA, COPD (2Lpm O2 at home), HLD, AAA (stable, 4.5 cm diameter), Lumbar spinal stenosis.  He has pain located in Hips,  Left-sided.      Mr. Duris reports that his pain began 6 months ago with no clear precipitating event..   Since the onset, the pain has been unchanged.  Treatment of his pain has not improved pain and function.  Mr. Maahs  reports 4-5/10 level of pain, on a 0-10 Numeric Pain Rating Scale. It can get up to a 9-10 on his worst days.     Mr. Ramroop describes his current pain as stabbing and continuous, it radiates down hi left buttock down to the back of his knee. He states the pain is at times  worse bheind the thigh than the origin of his pain, his left hip.   Mr.Tufte reports that his pain is worst during the morning.  His pain is made worse by movement, getting up out of bed.  His pain is improved by nothing.    Other pain complaints/history include midline low back pain (has had back surgery 4-5 years ago).  Mood: "feel's ok".     As a result of his pain, Mr. Belote notes limited activity, but hasn't made a great deal of difference in light of his severe COPD.   Mr. Gewirtz sleep is not disturbed by the pain.    He states he has had 3 injections in the hip, 1 in the muscle, 2 around the nerve. After which he got an MRI - and was suggested to have an Epidural steroid injection.  He didn't do anything about the pain for the first month or so and then his PCP referred him to sports medicine and he received the abovementioned 3 injections. Not very active 2/2 COPD, unable to do yard work anymore. He is only able to do "a block or two". He states he is limited by going further by both pain and dyspnea. He states that his pain is worst in the  morning, that it is so bad that it "turns my stomach"    He states his sleep is erratic, sleeps until 3-4 in the morning and wakes up to go to the bathroom and then goes back to sleep until 6 am or so. He is not woken up by his pain, but by urinary problems. He notes that once he wakes up his pain is exacerbated though and doesn't stop.     He has tried PT with [facility]" and did 4 session and he was discharged because he was not perceiving improvement.     Pain Tracker  Pain Tracker Scores  ORT Score -  6/26  AUDIT-C Score - 4/12  PHQ-9 Score - 4/27  SI Score - 0/3  GAD-7: - 2/21  PTSD Score - 0/4  STOP Score - 2/4  FM Score       -            3/31    Pain intensity & Interference (lower is better)  Pain intensity7/10   Pain Interference 7/10    Sleep  (lower is better)  Sleep Initiation            5/10  Sleep Maintenance 8/10    ODI & Important Activity Difficulty (lower is better)  ODI 58/100  Activity Difficulty 8/10    QOL & Satisfaction   Quality of Life  8/10  Treatment Satisfaction 2/10    Days With Excess Meds (Last Month): NONE    Important Activity: SHOWERING    Most Bothersome Side Effect: DIZZINESS    Current pain therapies: hydromorphone.  Other specialists/providers who have seen/evaluated him for pain complaints include: Neurosurgeon, sports medicine, physica.  Treatment for this pain complaint and associated symptoms has included:   INTERVENTIONS:   THERAPIES:   MEDICATIONS   OPIOID MEDICATIONS: hydromorphone 4 mg, hydrocodone/apap   ANTI-EPILEPTIC MEDICATIONS: gabapentin 100 mg   MUSCLE RELAXANTS:   ANTI-DEPRESSANTS: citalopram    ANTI-ANXIETY:   SLEEP:   NSAIDS/OTHER PAIN/PSYCHIATRIC/SEDATING/MOOD MEDICATIONS: APAP   OTHER TREATMENTS, INCLUDING COMPLEMENTARY AND ALTERNATIVE MEDICINE APPROACHES:    He feels that the most effective treatments are or have been  None.    Diagnostic and radiologic tests/results have included    MRI 03/04/2016        PAST HISTORY:  Past Medical History:   Diagnosis Date   . Abdominal aneurysm without mention of rupture ([place]) 12/24/2005    Borderline, seen on CT 8/06.   . Aortic aneurysm of unspecified site without mention of rupture ([facility])     Aortic Aneurysm   . Benign neoplasm of colon 12/24/2005    Tubular adenoma. Dx'ed flex sig 2000--> colonoscopy 2000: N.  F/U 2003: mild diverticulosis, tubular adenoma.  F/U due 2008 with CT colonography.   . Calculus of kidney 12/24/2005    Lithotripsy 1988   . COPD 12/24/2005    emphysema   . Diverticulitis of colon (without mention of hemorrhage)(562.11) 01/07/2007    Hospitalized at [facility] May 2008   . ECZEMA  12/24/2005    Lower extremities in particular.   Marland Kitchen ERECTILE DYSFUNCTION 12/24/2005   . Generalized  osteoarthrosis, unspecified site 01/07/2007    Has seen orthopedics--cortisone shot to L knee helped.   . Hypertrophy of prostate without urinary obstruction and other lower urinary tract symptoms (LUTS) 12/24/2005   . MALIG NEOPLASM DORSAL TONGUE(aka CANCER) 03/29/2002    Ssurgery 1994, T1N1 squamous cell ca L tongue, partial glossectomy; L supraorbital hyoid neck dissection    .  Other specified disorders resulting from impaired renal function 07/22/2005    Creat ~ 1.2   . Sleep apnea    . Thoracic aneurysm without mention of rupture ([place]) 12/24/2005    Ascending aortic aneurysm.  Followed by Dr. Ralene Cork, Cardiac surgery.  Gets regular CT scans.  Goal systolic BP: 220-254   . Tobacco use disorder 12/24/2005    Quit 2000.   Marland Kitchen Unspecified disorder of muscle, ligament, and fascia    . Unspecified hearing loss 12/24/2005    Bilat hearing aids.   . Ventricular septal defect 12/24/2005    Small muscular VSD--NEEDS SBE PROPHYLAXIS. (Clindamycin 600 mg prior to dental visits.)     Past Surgical History:   Procedure Laterality Date   . brachy therapy     . Fusion L foot distal phalange     . heart stent placement  09/2010, 02/2011    [place]   . L4-L5 laminectomy  Mar 21 2012    L spine surgery    . LIGATION PRQ VAS DEFERENS UNI/BI SPX     . lipotripsy surgery     . Lithotrypsy of R renal calculi     . Removal of Tongue Carcinoma with Lymphadenectomy     . Retinaculum release B/L wrists     . REVJ TOT KNEE ARTHRP [place]      left knee   . RPR 1ST INGUN HRNA AGE 24 MO-5 YRS REDUCIBLE     . Vasectomy Reversal     . VASECTOMY W/TRAY      x2   . Ventral Hernia Repair      x3     Family History     Problem (# of Occurrences) Relation (Name,Age of Onset)    Alcohol Abuse (1) Mother    Alzheimer's disease (2) Father, Mother    Arthritis (1) Father    Cancer (3) Father: Prostate, Mother, Brother    No Family Hx (1) Brother    Other (46) Father: Chronic Kidney Disease    Psych (1) Mother: Alzheimer's Disease        Social History      Social History Narrative    From sept 2011 note from Dr. Claybon Jabs, cardiology:  He has been married 3 times, 25 years to his current wife.  He has 5 daughters living and well although two of his daughters are rather overweight. He is working as a Water quality scientist. He smoked one package of cigarettes a day from the age of 76 years until about 10 years ago, although for the last 5 years of smoking, he was down to about half a package of cigarettes a day.  He is an admitted alcoholic and quit drinking in 1981, at which time he attended [place] meetings but not any more.        1.24.2012: Married to [employer]. 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 Just retired! Plans on spending more time woodworking. Goes to gym--exercises 20-30 mins every other day. Ct    -- Considering working for [employer]     Current Outpatient Prescriptions   Medication Sig Dispense Refill   . Acetaminophen 500 MG Oral Tab Take 1-2 tablets three times daily for left hip pain. Do not exceed 3000 mg of acetaminophen in a day. (Patient not taking: Reported on 07/16/2016) 100 tablet 2   . Albuterol Sulfate HFA 108 (90 Base) MCG/ACT Inhalation Aero Soln Inhale 2 puffs by mouth every 4 hours as needed for  shortness of breath/wheezing. For wheezing and/or cough. 1 Inhaler 12   . Aspirin 81 MG Oral Tab 1 tablet by mouth once daily     . Atorvastatin Calcium (LIPITOR) 80 MG Oral Tab Take 1 tablet (80 mg) by mouth daily. 90 tablet 3   . Carbamide Peroxide 6.5 % Otic Solution Place 5-10 drops into right ear 2 times a day. 15 mL 0   . Cyanocobalamin (B-12) 100 MCG Oral Tab Take 100 mcg by mouth.     . Furosemide 20 MG Oral Tab Take 1 tablet (20 mg) by mouth daily. 90 tablet 3   . [START ON 07/20/2016] HYDROmorphone HCl 4 MG Oral Tab 1 tablet(s) by mouth every 12 hours if needed to relieve pain. Max 60 tabs per month 60 tablet 0   . Metoprolol Succinate ER 100 MG Oral TABLET SR 24 HR Take 1 tablet (100 mg) by mouth daily. Do not chew or crush.  90 tablet 3   . Multiple Vitamins-Minerals (MULTIVITAMIN OR) Once daily     . NIFEdipine ER 30 MG Oral TABLET SR 24 HR TAKE 1 TABLET DAILY 90 tablet 1   . Pantoprazole Sodium 40 MG Oral Tab EC TAKE 1 TABLET DAILY 90 tablet 1   . Polyethylene Glycol 3350 (MIRALAX) Oral Powder Take 17 g by mouth.     . SPIRIVA HANDIHALER 18 MCG Inhalation Cap INHALE THE CONTENTS OF 1 CAPSULE DAILY, AFTER BREATHING OUT, INHALE A SECOND TIME 90 capsule 1   . SYMBICORT 160-4.5 MCG/ACT Inhalation Aerosol USE 2 INHALATIONS EVERY 12 HOURS 30.6 g 2   . Tamsulosin HCl 0.4 MG Oral Cap Take 1 capsule (0.4 mg) by mouth at bedtime. 90 capsule 2   . Triamcinolone Acetonide 0.1 % External Ointment Apply 1 application topically 2 times a day. Apply to affected areas bid 80 g 0     No current facility-administered medications for this visit.      ALLERGIES: Penicillin g and Percocet [oxycodone-acetaminophen]  Mr. Kibbe reports no side effects.    Past medical, surgical, family, and social history as above was  reviewed and updated personally with Mr. Pannone.  Medications and allergies were also reviewed and confirmed personally.    REVIEW OF SYSTEMS:  The Review of Systems was provided by Mr. Laughner and entered in the [place] note associated with this encounter.  I personally reviewed and confirmed the ROS information with Mr. Geissinger and have the following additional comments:  none.    BP 104/65   Pulse 90   Temp 97.3 F (36.3 C) (Temporal)   Ht 5\' 11"  (1.803 m)   Wt (!) 220 lb 12.8 oz (100.2 kg)   SpO2 93%   BMI 30.80 kg/m   Estimated body mass index is 30.8 kg/m as calculated from the following:    Height as of this encounter: 5\' 11"  (1.803 m).    Weight as of this encounter: 220 lb 12.8 oz (100.2 kg).    Physical Exam   Constitutional: He is well-developed, well-nourished, and in no distress.   HENT:   Head: Normocephalic and atraumatic.   Nose: Nose normal.   Nasal cannula in place in bilateral nares. Bilateral hearing aids in place.   Eyes:  Conjunctivae are normal. Right eye exhibits no discharge. Left eye exhibits no discharge.   Cardiovascular: Normal rate and regular rhythm.    Pulmonary/Chest: Effort normal. No respiratory distress.   Musculoskeletal:        Left upper leg: He exhibits  tenderness.        Right lower leg: He exhibits edema.        Left lower leg: He exhibits edema.        Legs:  GAIT & STATION:  gait within exam room intact, can walk on heels and toes, Romberg intact and tandem gait impaired but able to perform  Unipedal stance: Borderline   can squat and rise  2-3+ pitting edema bilaterally  SPINE:  no apparent scoliosis or kyphosis, normal cervical and lumbar lordosis. Well healed low back surgical scar  Lumbar curvature: normal  Lumbar active range of motion:   Flexion: limited; pain  Extension:  limited; pain  Right rotation + extension: pain ipsilateral, Left rotation + extension: pain ipsilateral  Cervical spine:  Has a forward position to cervical spine Curvature:  normal  Cervical active range of motion:normal, no pain with ROM maneuvers  Shoulders: full range of motion   Well healed Left Total knee replacement scar   No tenderness to palpation over back  Straight Leg Raise (estimated degrees):  Right: negative  at 60 degrees   Left: negativ at 60 degrees   Lumbopelvic examination: FABER: On left, ellicits left buttock pain.     Neurological:   Reflex Scores:       Tricep reflexes are 0 on the right side and 1+ on the left side.       Bicep reflexes are 1+ on the right side and 1+ on the left side.       Brachioradialis reflexes are 1+ on the right side and 1+ on the left side.       Patellar reflexes are 2+ on the right side and 2+ on the left side.       Achilles reflexes are 1+ on the right side and 0 on the left side.  Sensory examination: normal, normal to light touch and pinprick and normal to vibration sensation in all extremities Except: decreased cold BL feet, Decreased vibration sense BL feet.   Allodynia: absent     Hyperalgesia:absent  Hyperpathia: absent   Dysesthesia: absent  Motor strength: 5/5 in all muscle groups of the upper and lower extremities     Vitals reviewed.        DIAGNOSES:  Patient Active Problem List    Diagnosis Date Noted   . Generalized osteoarthrosis, unspecified site [M15.9] 01/07/2007     Has seen orthopedics--cortisone shot to L knee helped.  L knee replacement 2008     . Diverticulitis of colon (without mention of hemorrhage)(562.11) [K57.32] 01/07/2007     Hospitalized at [facility] May 2008     . Tobacco use disorder [F17.200] 12/24/2005     Quit 2000.  50 pack year history  EtOH--quit 1986.     Marland Kitchen Unspecified hearing loss [H91.90] 12/24/2005     Bilat hearing aids.     . ECZEMA  [L25.9] 12/24/2005     Lower extremities in particular.     Marland Kitchen ERECTILE DYSFUNCTION [N52.9] 12/24/2005   . Benign neoplasm of colon [D12.6] 12/24/2005     Tubular adenoma. Dx'ed flex sig 2000--> colonoscopy 2000: N.  F/U 2003: mild diverticulosis, tubular adenoma.  F/U due 2008 with CT colonography.--done 7/08: ess. N.  Next due 2013--done 2014, no polyps seen. Due 2019       . Calculus of kidney [N20.0] 12/24/2005     Lithotripsy 1988  Stone panel in 0ct 09: high oxalate. Given low oxalae diet by Dr. Princella [name], nephrology in 2009.       Marland Kitchen  Panlobular emphysema ([place]) [J43.1] 12/24/2005     05/2013: started by pulm on home O2  Emphysema: an FEV1 which is approximately at 33% of predicted    04/2010--Pulm: Chronic obstructive pulmonary disease, severe to very severe. I explained to him that continuing on the Spiriva as well as the Serevent was likely good therapy.I further told him that if he should have recurrent exacerbations within the course of the year that I would consider adding an inhaled corticosteroid at that point in time. He does not need oxygen at this point in time. He remains a nonsmoker. He is already engaged in a pulmonary rehabilitation.    Today I described to him how overall lung volume reduction surgery had no  significant benefit on mortality. Subgroup analysis did indicate that those patients, who had upper lobe predominant disease with low exercise capacity were the most likely to benefit. Given the fact that he actually still has reasonably good functional status and is getting around and going to the gym and working out, it is not clear that he would be in the group that would most likely benefit, although, to safe for certain, we need to get a cardiopulmonary exercise test. At this point in time, he is feeling good with his chronic obstructive pulmonary disease therapy and is not interested in pursuing lung volume reduction surgery.    05/20/2010 CT angio: Addendum: Increased scarring in the right lung base since the prior   exam including the interval development of a new nodule. The   appearance suggests that the patient has had a recent infection of   the right lower lobe has resolved and since the prior scan. However   recommend 3 months followup of the right lower lobe lesion given that   the patient is high risk with extensive paraseptal emphysema.     08/03/11 CT Chest  1. New indeterminate nodules in the lower lobes bilaterally, the largest   nodule measures 10 x 12 mm in the paraspinal location in the right lower lobe.  Possible considerations include infectious/inflammatory and less likely   metastasis.   2. Bilateral upper lobe predominant moderate centrilobular and paraseptal   emphysema.   3. Unchanged dilated main pulmonary artery measuring 32 mm may indicate   pulmonary arterial hypertension.   4. Aortic aneurysm involving the ascending aorta at the level of the main   pulmonary artery measuring 47 mm.     10/2011:   Impression:  1. Previous opacities resolved. New focus of small airways inflammation in the  RUL. No worrisome nodule or opacity for malignancy. If followup of the new   airways inflammation is desired a 12 month study will be adequate.  2. Stable fibrosis and peripheral calcification in the  RLL, which raises the   possibility of chronic aspiration.                  . Thoracic aneurysm without mention of rupture [I71.2] 12/24/2005     A. CT 12/2014:   Aneurysmal dilatation of the ascending aorta is again identified measuring up   to 4.6 cm at the level of the main pulmonary artery, previously 4.7 cm in   2013.  B.  CTA 01/06/16:  Ascending aortic dimension 4.5 cm. Yearly follow up       . AAA (abdominal aortic aneurysm) ([place]) [I71.4] 12/24/2005     Father also had AAA. Borderline, seen on CT 8/06.  Also has thoracic aneurysm     . Ventricular septal  defect [Q21.0] 12/24/2005     Small muscular VSD--NEEDS SBE PROPHYLAXIS. (Clindamycin 600 mg prior to dental visits.)     . Hypertrophy of prostate without urinary obstruction and other lower urinary tract symptoms (LUTS) [N40.0] 12/24/2005   . Poor balance [R26.89] 07/16/2016   . Left buttock pain [M79.1] 07/16/2016   . Lumbar radiculopathy [M54.16] 03/18/2016   . 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 ventricular polyps [J38.7] 10/16/2014   . Dysphonia [R49.0] 10/16/2014   . Pneumothorax on right [J93.9] 01/22/2014   . Right rib fracture [S22.31XA] 01/22/2014   . Falls [phone].[id number]] 10/26/2013     Inpatient evaluation September 2014, felt to have micturition syncope.  7/15 -- orthostatic hypotension sxs, BPs in afternoon in mid to high 90s to low 660Y systolic, 30Z to 60F diastolic. D/Ced Nifedipine.     . Alcohol use disorder, moderate, in sustained remission [F10.21]      Had been in remission for many years but recently relapsed. Goes to [employer] very regularly       . Carotid stenosis [I65.29]             . Major depressive disorder with single episode, in full remission ([place]) [F32.5] 11/10/2012   . Lumbar stenosis with neurogenic claudication [M48.062] 02/05/2012     March 21, 2012, underwent L4-L5 bilateral laminectomies and partial medial facetectomies, as well as an L4-L5 right-sided synovial cyst resection. He says since surgery he feels better       .  Coronary artery disease involving native coronary artery of native heart with angina pectoris ([place]) [I25.119] 12/30/2010     Non-ST elevation myocardial infarction., July 18,2012  LAD drug-eluting stent nov, 2012. Clopidogrel         . Malignant neoplasm of prostate ([place]) [C61] 08/14/2010     Diagnosed 07/2010. Sees [name] (urol) and [name] (rad onc). Saw [name], [name]--[facility] [place]--palladium seed implants June 2012  Mr. Behrens has an intermediate risk prostate cancer on the basis of a Gleason 7 histology       . Lumbago [M54.5] 12/19/2009     March 21, 2012, underwent L4-L5 bilateral laminectomies and partial medial facetectomies, as well as an L4-L5 right-sided synovial cyst resection. He says since surgery he feels better       . Routine general medical examination at a health care facility [Z00.00] 05/15/2009     06/07/2012  Td/Tdap (q 10 yr):  2004, 2012  Pneumococcal (65 yoa and high risk x1):  2010, 2004  Influenza vaccine (q 1 yr):  2010 nov, 2011, 2012, 2013  Shingles: discussed--should get at his local pharmacy  Blood pressure:  Excellent 138/64  121/72  Cholesterol (q 5 yr begin age 45):on simvastatin oct 2012: excellent--will repeat  AAA screen (x1, male age 5, if ever smoked):  Checked at [facility]-- abdo and thoracic CT--aug 2012: stable ascending aortic aneurysm  Colorectal cancer: ct colonoscopy done 2008, repeat due 2013--done April 2013: neg   PSA:  Sees urology for fu prostate ca. Has had palladium seed implants  Tobacco:  NS x 11 yrs, 50 pack yr hx  Exercise:  Reg--treadmill x 20-30 mins then weight machines--every other day! Has not been back to the gym because of the back pain  Alcohol /Drugs:  None in 26 yrs--relapse jan 2014--did inpatient treatment--now none in 37 days!  Sexual practice/contraception:  Not an issue  Advanced directives:  Does have living will. DPOA  done       .  Chronic Kidney Disease -- Stage III [N25.89] 07/22/2005     Creat ~ 1.8.  Normal albuminuria (8/08)  Likely 2/2 HTN, poss component from hx obstructing nephrolithiasis.  8/08 Renal: referred to Uro for 37mm stone, nonobstructing. Also doing w/u for stone type, poss target prevention.     Marland Kitchen HYPERTENSION NOS(aka HTN) [I10] 03/29/2002   . Pure hypercholesterolemia [E78.00] 03/29/2002   . MALIG NEOPLASM DORSAL TONGUE(aka CANCER) [C02.0] 03/29/2002     [place], T1N1 squamous cell ca L tongue, partial glossectomy; L supraorbital hyoid neck dissection           Visit Specific Diagnoses:  (M54.16) Lumbar radiculopathy  (primary encounter diagnosis)  (R26.89) Poor balance  (M79.1) Left buttock pain    [place] Prescription Monitoring Program Review:  Hydromorphone 4 mg tab # 60 (30d) 06/23/2016    Current Daily Morphine Equivalent Dose:  32 MED    IMPRESSION/DISCUSSION:     Tonnie Friedel is a 82 year old male with a history of O2 dependent lung disease, daily aspirin for CAD with indwelling stent, obesity, past alcohol abuse with persistent daily alcohol use and prior L4-L5 laminectomy (for right lower extremity pain) now with left buttock and posterior thigh pain that persists after sciatic nerve hydrodissection/blocks.  His MRI demonstrates a left sided L5-S1 disc extrusion.    On exam he has findings suggestive of both a musculoskeletal and radicular component as well as a clear balance impairment.    He had no success with physical therapy.  He has not tried gabapentin or duloxetine.    We discussed our impressions and the following suggestions/options in detail with Mr. Guerrero and  provided him with additional information in the after visit summary.  Mr. Baranowski had no further questions.    Recommendations:  1. Diagnostic evaluation: none at present.  2. Rehabilitation:  As he clearly has a musculoskeletal component, considering another effort at physical therapy is reasonable.  ALSO-- he has poor balance at increased fall risk.  Physical therapy focusing on his musculoskeletal pain and  gait, balance, fall risk reduction would be reasonable. He is open to this.  3. Medication options to discuss with his PCP, Theresia Bough, MD and/or other involved providers:  3.1  Duloxetine trial. Duloxetine (Cymbalta) has pain relieving actions and is FDA approved for painful diabetic peripheral neuropathy, fibromyalgia, chronic musculoskeletal pain, generalized anxiety disorder, and depression.   A recent review listed duloxetine as having more evidence for efficacy than other medication for chronic low back pain Gerlene Burdock, Francee Piccolo, et al. "Systemic Pharmacologic Therapies for Low Back Pain: A Systematic Review for an [place] Pharmacologic Therapies for Low Back Pain." Annals of Internal Medicine 166.7 (2017): [phone].)  Duloxetine works on both serotonin and norepinephrine. Start 20 or 30 mg/day with food for 1 week.  If tolerating the starting dose, the dosage can be increased to the typical target dose of 60 mg/day with food.  Nausea is not uncommon at initiation of treatment, it is typically transitory and improves within one week. There is a slight risk of bleeding and hepatic dysfunction with duloxetine. If no benefit after 6-8 weeks at 60 mg taper and stop it.  Duloxetine is available as a generic medication.   3.2  Gabapentin trial.  Start 300 mg at night, titrate up on a three times a day schedule to a typical  target dose of 1200 to 2400 mg/day.  The maximum daily dose is 3600 mg/day. The dose  can be increased every two to three days if there are no significant side effects.  The most common side effects are CNS effects of sedation, cognitive impairment, and dizziness.  These side effects can be mitigated by starting dose increases at night and by making slow increases in dose. At times we suggest a larger dose at night (example:  1200 mg at night and 600 mg twice a day during this day). Potential benefits include improvement in sleep and pain.  If partial response with tolerable side effects, continue.  If no response or intolerable side effects, taper and discontinue.    4.   Procedure suggestions:  Utilizing a spinal model, I discussed epidural steroid injections including interlaminar and transforaminal approaches. I reviewed the potential role of them in his situation.  We discussed an interlaminar approach at L5-S1 on the left.  The rationale for the epidural steroid injection, alternatives, risks were discussed.  We had a detailed discussion of the procedure, risks, and alternatives.    We discussed positioning, his O2, and planning for holding aspirin (with approval of Dr Myrle Sheng).  His questions were answered.    5. Follow-up:  For injections.    Coordination of care:    We suggested that Mr. Whitlatch discuss our consultation findings with his  PCP, Theresia Bough, MD and other involved health care providers.    Chaya Jan, MD

## 2016-07-16 ENCOUNTER — Encounter (HOSPITAL_BASED_OUTPATIENT_CLINIC_OR_DEPARTMENT_OTHER): Payer: Self-pay | Admitting: Pain Medicine

## 2016-07-16 ENCOUNTER — Ambulatory Visit: Payer: No Typology Code available for payment source | Attending: Pain Medicine | Admitting: Pain Medicine

## 2016-07-16 VITALS — BP 104/65 | HR 90 | Temp 97.3°F | Ht 71.0 in | Wt 220.8 lb

## 2016-07-16 DIAGNOSIS — Z6830 Body mass index (BMI) 30.0-30.9, adult: Secondary | ICD-10-CM

## 2016-07-16 DIAGNOSIS — M791 Myalgia: Secondary | ICD-10-CM | POA: Insufficient documentation

## 2016-07-16 DIAGNOSIS — M7918 Myalgia, other site: Secondary | ICD-10-CM

## 2016-07-16 DIAGNOSIS — R2689 Other abnormalities of gait and mobility: Secondary | ICD-10-CM | POA: Insufficient documentation

## 2016-07-16 DIAGNOSIS — M5416 Radiculopathy, lumbar region: Secondary | ICD-10-CM | POA: Insufficient documentation

## 2016-07-16 NOTE — Progress Notes (Signed)
I saw and evaluated the patient with Dory Larsen, who conducted the initial history.  I reviewed and confirmed the history in detail with Mr. Teixeira in person.   I was present for the examination and formulation portions of the encounter. I agree with the findings and the plan of care as documented in our notes. I did edit the trainee's note.        Chaya Jan, MD  Medical Director  [facility]

## 2016-07-16 NOTE — Patient Instructions (Signed)
Nice to meet you.  A copy of our suggestions and other pain information.     Recommendations:  1. Diagnostic evaluation: none at present.  2. Rehabilitation:  As he clearly has a musculoskeletal component, considering another effort at physical therapy is reasonable.  ALSO-- he has poor balance at increased fall risk.  Physical therapy focusing on his musculoskeletal pain and gait, balance, fall risk reduction would be reasonable.  3. Medication options to discuss with his PCP, Theresia Bough, MD and/or other involved providers:  3.1  Duloxetine trial. Duloxetine (Cymbalta) has pain relieving actions and is FDA approved for painful diabetic peripheral neuropathy, fibromyalgia, chronic musculoskeletal pain, generalized anxiety disorder, and depression.   A recent review listed duloxetine as having more evidence for efficacy than other medication for chronic low back pain Gerlene Burdock, Francee Piccolo, et al. "Systemic Pharmacologic Therapies for Low Back Pain: A Systematic Review for an [place] Pharmacologic Therapies for Low Back Pain." Annals of Internal Medicine 166.7 (2017): [phone].)  Duloxetine works on both serotonin and norepinephrine. Start 20 or 30 mg/day with food for 1 week.  If tolerating the starting dose, the dosage can be increased to the typical target dose of 60 mg/day with food.  Nausea is not uncommon at initiation of treatment, it is typically transitory and improves within one week. There is a slight risk of bleeding and hepatic dysfunction with duloxetine. If no benefit after 6-8 weeks at 60 mg taper and stop it.  Duloxetine is available as a generic medication.   3.2  Gabapentin trial.  Start 300 mg at night, titrate up on a three times a day schedule to a typical  target dose of 1200 to 2400 mg/day.  The maximum daily dose is 3600 mg/day. The dose can be increased every two to three days if there are no significant side effects.  The most common  side effects are CNS effects of sedation, cognitive impairment, and dizziness.  These side effects can be mitigated by starting dose increases at night and by making slow increases in dose. At times we suggest a larger dose at night (example:  1200 mg at night and 600 mg twice a day during this day). Potential benefits include improvement in sleep and pain. If partial response with tolerable side effects, continue.  If no response or intolerable side effects, taper and discontinue.    4.   Procedure suggestions:  Utilizing a spinal model, I discussed epidural steroid injections including interlaminar and transforaminal approaches. I reviewed the potential role of them in his situation.  We discussed an interlaminar approach at L5-S1 on the left.  The rationale for the epidural steroid injection, alternatives, risks were discussed.  We had a detailed discussion of the procedure, risks, and alternatives.  I answered his questions.  We discussed positioning, his O2, and planning for holding aspirin (discuss with Dr Myrle Sheng).    5. Follow-up:  For injections.

## 2016-07-16 NOTE — Progress Notes (Signed)
Date PainTracker completed:  07/06/2016    Pain Tracker Scores  ORT Score     -           6/26  AUDIT-C Score -        4/12  PHQ-9 Score  -           4/27  SI Score          -           0/3  GAD-7:           -           2/21  PTSD Score   -           0/4  STOP Score   -           2/4  FM Score       -            3/31    Pain intensity & Interference (lower is better)  Pain intensity              7/10     Pain Interference        7/10    Sleep (lower is better)  Sleep Initiation            5/10  Sleep Maintenance     8/10    ODI & Important Activity Difficulty (lower is better)  ODI                             58/100  Activity Difficulty         8/10    QOL & Satisfaction   Quality of Life                 8/10  Treatment Satisfaction   2/10    Days With Excess Meds (Last Month): NONE    Important Activity:  SHOWERING    Most Bothersome Side Effect: DIZZINESS              Review of Systems   Constitutional: Negative.    HENT: Positive for hearing loss and tinnitus.    Eyes: Negative.    Respiratory: Positive for shortness of breath.    Cardiovascular: Positive for leg swelling.   Gastrointestinal: Negative.    Endocrine: Negative.    Genitourinary: Negative.    Musculoskeletal: Positive for back pain and myalgias.   Skin: Negative.    Allergic/Immunologic: Negative.    Neurological: Positive for light-headedness.   Hematological: Negative.    Psychiatric/Behavioral: Negative.

## 2016-07-20 ENCOUNTER — Encounter (HOSPITAL_BASED_OUTPATIENT_CLINIC_OR_DEPARTMENT_OTHER): Payer: Self-pay | Admitting: Pain Medicine

## 2016-07-21 ENCOUNTER — Encounter (INDEPENDENT_AMBULATORY_CARE_PROVIDER_SITE_OTHER): Payer: Self-pay | Admitting: Family Medicine

## 2016-07-21 DIAGNOSIS — M545 Low back pain: Secondary | ICD-10-CM

## 2016-07-21 DIAGNOSIS — G8929 Other chronic pain: Secondary | ICD-10-CM

## 2016-07-21 MED ORDER — DULOXETINE HCL 30 MG OR CPEP
DELAYED_RELEASE_CAPSULE | ORAL | 1 refills | Status: AC
Start: 2016-07-21 — End: ?

## 2016-07-21 NOTE — Telephone Encounter (Signed)
Generic response sent to patient.    Routing to Dr. Aida Raider provider to advise on patient request.

## 2016-07-23 ENCOUNTER — Telehealth (INDEPENDENT_AMBULATORY_CARE_PROVIDER_SITE_OTHER): Payer: Self-pay | Admitting: Cardiovascular Disease

## 2016-07-23 NOTE — Telephone Encounter (Signed)
Incoming fax received from [facility] regarding ESI.  FOrm has been placed in Dr. Sherrye Payor desk for completion

## 2016-07-27 ENCOUNTER — Other Ambulatory Visit (INDEPENDENT_AMBULATORY_CARE_PROVIDER_SITE_OTHER): Payer: Self-pay | Admitting: Family Medicine

## 2016-07-27 DIAGNOSIS — I10 Essential (primary) hypertension: Secondary | ICD-10-CM

## 2016-07-28 ENCOUNTER — Inpatient Hospital Stay: Payer: Self-pay

## 2016-07-28 MED ORDER — METOPROLOL SUCCINATE ER 100 MG OR TB24
EXTENDED_RELEASE_TABLET | ORAL | 2 refills | Status: AC
Start: 2016-07-28 — End: ?

## 2016-07-28 NOTE — Telephone Encounter (Signed)
Unable to locate form--Inquiries about holding ASA routed to Dr. Myrle Sheng    See 07/16/2016 office visit from [facility] center pain relief    4.   Procedure suggestions:  Utilizing a spinal model, I discussed epidural steroid injections including interlaminar and transforaminal approaches. I reviewed the potential role of them in his situation.  We discussed an interlaminar approach at L5-S1 on the left.  The rationale for the epidural steroid injection, alternatives, risks were discussed.  We had a detailed discussion of the procedure, risks, and alternatives.    We discussed positioning, his O2, and planning for holding aspirin (with approval of Dr Myrle Sheng).  His questions were answered.

## 2016-07-28 NOTE — Telephone Encounter (Signed)
Dr. Sherrye Payor response has been routed to [facility] (Amy Luciana Axe)  ----- Message -----  From: Roda Shutters, MD  Sent: 07/28/2016  12:16 PM    He may hold aspirin for 5-7 days  Resume post procedure.

## 2016-08-03 ENCOUNTER — Other Ambulatory Visit: Payer: Self-pay | Admitting: Emergency Medicine

## 2016-08-03 ENCOUNTER — Inpatient Hospital Stay
Admission: EM | Admit: 2016-08-03 | Discharge: 2016-08-10 | DRG: 392 | Disposition: A | Discharge status: Skilled Nursing Facility | Payer: No Typology Code available for payment source | Attending: Family Medicine | Admitting: Family Medicine

## 2016-08-03 ENCOUNTER — Inpatient Hospital Stay (HOSPITAL_COMMUNITY): Payer: No Typology Code available for payment source | Admitting: Family Medicine

## 2016-08-03 ENCOUNTER — Telehealth (INDEPENDENT_AMBULATORY_CARE_PROVIDER_SITE_OTHER): Payer: Self-pay | Admitting: Family Medicine

## 2016-08-03 ENCOUNTER — Other Ambulatory Visit: Payer: Self-pay

## 2016-08-03 DIAGNOSIS — K567 Ileus, unspecified: Secondary | ICD-10-CM

## 2016-08-03 DIAGNOSIS — I2510 Atherosclerotic heart disease of native coronary artery without angina pectoris: Secondary | ICD-10-CM

## 2016-08-03 DIAGNOSIS — F1099 Alcohol use, unspecified with unspecified alcohol-induced disorder: Secondary | ICD-10-CM

## 2016-08-03 DIAGNOSIS — N2889 Other specified disorders of kidney and ureter: Secondary | ICD-10-CM

## 2016-08-03 DIAGNOSIS — Z66 Do not resuscitate: Secondary | ICD-10-CM | POA: Diagnosis present

## 2016-08-03 DIAGNOSIS — R933 Abnormal findings on diagnostic imaging of other parts of digestive tract: Secondary | ICD-10-CM

## 2016-08-03 DIAGNOSIS — Z9981 Dependence on supplemental oxygen: Secondary | ICD-10-CM

## 2016-08-03 DIAGNOSIS — R112 Nausea with vomiting, unspecified: Secondary | ICD-10-CM

## 2016-08-03 DIAGNOSIS — F10129 Alcohol abuse with intoxication, unspecified: Secondary | ICD-10-CM

## 2016-08-03 DIAGNOSIS — W109XXA Fall (on) (from) unspecified stairs and steps, initial encounter: Secondary | ICD-10-CM | POA: Diagnosis present

## 2016-08-03 DIAGNOSIS — N183 Chronic kidney disease, stage 3 (moderate): Secondary | ICD-10-CM | POA: Diagnosis present

## 2016-08-03 DIAGNOSIS — S0993XA Unspecified injury of face, initial encounter: Secondary | ICD-10-CM

## 2016-08-03 DIAGNOSIS — R938 Abnormal findings on diagnostic imaging of other specified body structures: Secondary | ICD-10-CM

## 2016-08-03 DIAGNOSIS — Z9181 History of falling: Secondary | ICD-10-CM

## 2016-08-03 DIAGNOSIS — J449 Chronic obstructive pulmonary disease, unspecified: Secondary | ICD-10-CM

## 2016-08-03 DIAGNOSIS — R93421 Abnormal radiologic findings on diagnostic imaging of right kidney: Secondary | ICD-10-CM

## 2016-08-03 DIAGNOSIS — G8929 Other chronic pain: Secondary | ICD-10-CM | POA: Diagnosis present

## 2016-08-03 DIAGNOSIS — R42 Dizziness and giddiness: Secondary | ICD-10-CM

## 2016-08-03 DIAGNOSIS — S2249XA Multiple fractures of ribs, unspecified side, initial encounter for closed fracture: Secondary | ICD-10-CM | POA: Diagnosis present

## 2016-08-03 DIAGNOSIS — K5900 Constipation, unspecified: Principal | ICD-10-CM | POA: Diagnosis present

## 2016-08-03 DIAGNOSIS — K219 Gastro-esophageal reflux disease without esophagitis: Secondary | ICD-10-CM | POA: Diagnosis present

## 2016-08-03 DIAGNOSIS — J9610 Chronic respiratory failure, unspecified whether with hypoxia or hypercapnia: Secondary | ICD-10-CM | POA: Diagnosis present

## 2016-08-03 DIAGNOSIS — S22080A Wedge compression fracture of T11-T12 vertebra, initial encounter for closed fracture: Secondary | ICD-10-CM | POA: Diagnosis present

## 2016-08-03 DIAGNOSIS — I714 Abdominal aortic aneurysm, without rupture: Secondary | ICD-10-CM | POA: Diagnosis present

## 2016-08-03 DIAGNOSIS — F1010 Alcohol abuse, uncomplicated: Secondary | ICD-10-CM | POA: Diagnosis present

## 2016-08-03 DIAGNOSIS — R319 Hematuria, unspecified: Secondary | ICD-10-CM | POA: Diagnosis present

## 2016-08-03 DIAGNOSIS — I10 Essential (primary) hypertension: Secondary | ICD-10-CM

## 2016-08-03 DIAGNOSIS — Z955 Presence of coronary angioplasty implant and graft: Secondary | ICD-10-CM

## 2016-08-03 DIAGNOSIS — R05 Cough: Secondary | ICD-10-CM

## 2016-08-03 DIAGNOSIS — I129 Hypertensive chronic kidney disease with stage 1 through stage 4 chronic kidney disease, or unspecified chronic kidney disease: Secondary | ICD-10-CM | POA: Diagnosis present

## 2016-08-03 DIAGNOSIS — J441 Chronic obstructive pulmonary disease with (acute) exacerbation: Secondary | ICD-10-CM | POA: Diagnosis present

## 2016-08-03 DIAGNOSIS — S0990XA Unspecified injury of head, initial encounter: Secondary | ICD-10-CM

## 2016-08-03 DIAGNOSIS — I6529 Occlusion and stenosis of unspecified carotid artery: Secondary | ICD-10-CM | POA: Diagnosis present

## 2016-08-03 DIAGNOSIS — Z8546 Personal history of malignant neoplasm of prostate: Secondary | ICD-10-CM

## 2016-08-03 LAB — CBC (HEMOGRAM)
Hematocrit: 31 % — ABNORMAL LOW (ref 38–50)
Hemoglobin: 10.4 g/dL — ABNORMAL LOW (ref 13.0–18.0)
MCH: 31.1 pg (ref 27.3–33.6)
MCHC: 34 g/dL (ref 32.2–36.5)
MCV: 92 fL (ref 81–98)
Platelet Count: 229 10*3/uL (ref 150–400)
RBC: 3.34 10*6/uL — ABNORMAL LOW (ref 4.40–5.60)
RDW-CV: 15.5 % — ABNORMAL HIGH (ref 11.6–14.4)
WBC: 5.27 10*3/uL (ref 4.3–10.0)

## 2016-08-03 LAB — COMPREHENSIVE METABOLIC PANEL
ALT (GPT): 21 U/L (ref 10–48)
AST (GOT): 27 U/L (ref 9–38)
Albumin: 3.5 g/dL (ref 3.5–5.2)
Alkaline Phosphatase (Total): 82 U/L (ref 52–227)
Anion Gap: 9 (ref 4–12)
Bilirubin (Total): 1.6 mg/dL — ABNORMAL HIGH (ref 0.2–1.3)
Calcium: 9.7 mg/dL (ref 8.9–10.2)
Carbon Dioxide, Total: 33 meq/L — ABNORMAL HIGH (ref 22–32)
Chloride: 92 meq/L — ABNORMAL LOW (ref 98–108)
Creatinine: 1.48 mg/dL — ABNORMAL HIGH (ref 0.51–1.18)
GFR, Calc, African American: 56 mL/min/{1.73_m2} — ABNORMAL LOW (ref >59)
GFR, Calc, European American: 46 mL/min/{1.73_m2} — ABNORMAL LOW (ref >59)
Glucose: 119 mg/dL (ref 62–125)
Potassium: 3.8 meq/L (ref 3.6–5.2)
Protein (Total): 6.7 g/dL (ref 6.0–8.2)
Sodium: 134 meq/L — ABNORMAL LOW (ref 135–145)
Urea Nitrogen: 31 mg/dL — ABNORMAL HIGH (ref 8–21)

## 2016-08-03 LAB — PROTHROMBIN TIME
Prothrombin INR: 1.2 (ref 0.8–1.3)
Prothrombin Time Patient: 14.8 s (ref 10.7–15.6)

## 2016-08-03 LAB — LAB ADD ON ORDER

## 2016-08-03 LAB — MAGNESIUM: Magnesium: 1.8 mg/dL (ref 1.8–2.4)

## 2016-08-03 LAB — LIPASE: Lipase: 11 U/L (ref <70)

## 2016-08-03 NOTE — Telephone Encounter (Signed)
Noted  

## 2016-08-03 NOTE — Telephone Encounter (Signed)
(  TEXTING IS AN OPTION FOR [facility] ONLY)  Is this a [place] clinic? No      RETURN CALL: General message OK      SUBJECT:  General Message     REASON FOR REQUEST: Patient's spouse calls to inform us she is taking her spouse to the [place], he is throwing up dark dark dark dark green, Blood pressure all over the place. High and very low,    MESSAGE: Urine is orange and and vomiting, Spouse taking him to the ED.

## 2016-08-03 NOTE — Telephone Encounter (Signed)
Pt seen at ED.  Closing.    FYI to Dr. Satira Sark.

## 2016-08-04 ENCOUNTER — Other Ambulatory Visit: Payer: Self-pay | Admitting: Unknown Physician Specialty

## 2016-08-04 ENCOUNTER — Other Ambulatory Visit: Payer: Self-pay | Admitting: Family Medicine

## 2016-08-04 DIAGNOSIS — R109 Unspecified abdominal pain: Secondary | ICD-10-CM

## 2016-08-04 DIAGNOSIS — R918 Other nonspecific abnormal finding of lung field: Secondary | ICD-10-CM

## 2016-08-04 DIAGNOSIS — R0682 Tachypnea, not elsewhere classified: Secondary | ICD-10-CM

## 2016-08-04 LAB — CBC (HEMOGRAM)
Hematocrit: 27 % — ABNORMAL LOW (ref 38–50)
Hemoglobin: 9 g/dL — ABNORMAL LOW (ref 13.0–18.0)
MCH: 31 pg (ref 27.3–33.6)
MCHC: 33.3 g/dL (ref 32.2–36.5)
MCV: 93 fL (ref 81–98)
Platelet Count: 234 10*3/uL (ref 150–400)
RBC: 2.9 10*6/uL — ABNORMAL LOW (ref 4.40–5.60)
RDW-CV: 15.9 % — ABNORMAL HIGH (ref 11.6–14.4)
WBC: 4.53 10*3/uL (ref 4.3–10.0)

## 2016-08-04 LAB — BASIC METABOLIC PANEL
Anion Gap: 8 (ref 4–12)
Calcium: 8.5 mg/dL — ABNORMAL LOW (ref 8.9–10.2)
Carbon Dioxide, Total: 29 meq/L (ref 22–32)
Chloride: 99 meq/L (ref 98–108)
Creatinine: 1.37 mg/dL — ABNORMAL HIGH (ref 0.51–1.18)
GFR, Calc, African American: >60 mL/min/{1.73_m2} (ref >59)
GFR, Calc, European American: 50 mL/min/{1.73_m2} — ABNORMAL LOW (ref >59)
Glucose: 113 mg/dL (ref 62–125)
Potassium: 3.2 meq/L — ABNORMAL LOW (ref 3.6–5.2)
Sodium: 136 meq/L (ref 135–145)
Urea Nitrogen: 25 mg/dL — ABNORMAL HIGH (ref 8–21)

## 2016-08-04 LAB — URINALYSIS WITH REFLEX CULTURE
Bacteria, URN: NONE SEEN
Bilirubin (Qual), URN: NEGATIVE
Epith Cells_Renal/Trans,URN: NEGATIVE /HPF
Epith Cells_Squamous, URN: NEGATIVE /LPF
Glucose Qual, URN: NEGATIVE mg/dL
Ketones, URN: NEGATIVE mg/dL
Leukocyte Esterase, URN: NEGATIVE
Nitrite, URN: NEGATIVE
Occult Blood, URN: NEGATIVE
RBC, URN: NEGATIVE /HPF
Specific Gravity, URN: 1.034 g/mL — ABNORMAL HIGH (ref 1.002–1.027)
WBC, URN: NEGATIVE /HPF

## 2016-08-04 LAB — EKG 12 LEAD
Atrial Rate: 94 {beats}/min
Atrial Rate: 94 {beats}/min
Q-T Interval: 374 ms
Q-T Interval: 374 ms
QRS Duration: 100 ms
QRS Duration: 100 ms
QTC Calculation: 445 ms
QTC Calculation: 445 ms
R Axis: 20 degrees
R Axis: 20 degrees
T Axis: 16 degrees
T Axis: 16 degrees
Ventricular Rate: 85 {beats}/min
Ventricular Rate: 85 {beats}/min

## 2016-08-04 LAB — LAB ADD ON ORDER

## 2016-08-04 LAB — URINALYSIS COMPLETE, URN

## 2016-08-04 LAB — MAGNESIUM: Magnesium: 1.8 mg/dL (ref 1.8–2.4)

## 2016-08-05 ENCOUNTER — Ambulatory Visit (HOSPITAL_COMMUNITY): Payer: No Typology Code available for payment source

## 2016-08-05 ENCOUNTER — Other Ambulatory Visit: Payer: Self-pay | Admitting: Physician Assistant

## 2016-08-05 DIAGNOSIS — R600 Localized edema: Secondary | ICD-10-CM

## 2016-08-05 DIAGNOSIS — N183 Chronic kidney disease, stage 3 (moderate): Secondary | ICD-10-CM

## 2016-08-05 DIAGNOSIS — R34 Anuria and oliguria: Secondary | ICD-10-CM

## 2016-08-05 DIAGNOSIS — R109 Unspecified abdominal pain: Secondary | ICD-10-CM

## 2016-08-05 LAB — BASIC METABOLIC PANEL
Anion Gap: 7 (ref 4–12)
Calcium: 8.7 mg/dL — ABNORMAL LOW (ref 8.9–10.2)
Carbon Dioxide, Total: 28 meq/L (ref 22–32)
Chloride: 102 meq/L (ref 98–108)
Creatinine: 1.1 mg/dL (ref 0.51–1.18)
GFR, Calc, African American: >60 mL/min/{1.73_m2} (ref >59)
GFR, Calc, European American: >60 mL/min/{1.73_m2} (ref >59)
Glucose: 109 mg/dL (ref 62–125)
Potassium: 3.5 meq/L — ABNORMAL LOW (ref 3.6–5.2)
Sodium: 137 meq/L (ref 135–145)
Urea Nitrogen: 22 mg/dL — ABNORMAL HIGH (ref 8–21)

## 2016-08-05 LAB — EKG 12 LEAD
Atrial Rate: 94 {beats}/min
Q-T Interval: 374 ms
QRS Duration: 100 ms
QTC Calculation: 445 ms
R Axis: 20 degrees
T Axis: 16 degrees
Ventricular Rate: 85 {beats}/min

## 2016-08-05 LAB — CBC (HEMOGRAM)
Hematocrit: 29 % — ABNORMAL LOW (ref 38–50)
Hemoglobin: 9.4 g/dL — ABNORMAL LOW (ref 13.0–18.0)
MCH: 30.8 pg (ref 27.3–33.6)
MCHC: 32.9 g/dL (ref 32.2–36.5)
MCV: 94 fL (ref 81–98)
Platelet Count: 234 10*3/uL (ref 150–400)
RBC: 3.05 10*6/uL — ABNORMAL LOW (ref 4.40–5.60)
RDW-CV: 15.9 % — ABNORMAL HIGH (ref 11.6–14.4)
WBC: 5.71 10*3/uL (ref 4.3–10.0)

## 2016-08-05 LAB — PHOSPHATE: Phosphate: 3.3 mg/dL (ref 2.5–4.5)

## 2016-08-05 LAB — MAGNESIUM: Magnesium: 1.8 mg/dL (ref 1.8–2.4)

## 2016-08-05 NOTE — Telephone Encounter (Signed)
Dr. Myrle Sheng,    I just returned from vacation and found your note regarding med holds.    I called the patient to schedule and his wife told me that he is currently admitted to [facility] for treatment of injuries from 2 major falls.  She stated he fell down a flight of stairs that has 7 steps, and a fall in the bathroom.  She stated he has fractured ribs, possible nasal fracture and head injury.  She stated they are also treating him for pneumonia and bowel obstruction.    Obviously, we are not able to move forward with scheduling the epidural.  Once he is fully recovered, and if he needs the planned procedure, I will re-contact you for med holds again.    [place], [place]

## 2016-08-06 LAB — CBC (HEMOGRAM)
Hematocrit: 28 % — ABNORMAL LOW (ref 38–50)
Hemoglobin: 9.3 g/dL — ABNORMAL LOW (ref 13.0–18.0)
MCH: 31.1 pg (ref 27.3–33.6)
MCHC: 33.8 g/dL (ref 32.2–36.5)
MCV: 92 fL (ref 81–98)
Platelet Count: 253 10*3/uL (ref 150–400)
RBC: 2.99 10*6/uL — ABNORMAL LOW (ref 4.40–5.60)
RDW-CV: 15.7 % — ABNORMAL HIGH (ref 11.6–14.4)
WBC: 5.73 10*3/uL (ref 4.3–10.0)

## 2016-08-06 LAB — BASIC METABOLIC PANEL
Anion Gap: 7 (ref 4–12)
Calcium: 8.9 mg/dL (ref 8.9–10.2)
Carbon Dioxide, Total: 27 meq/L (ref 22–32)
Chloride: 101 meq/L (ref 98–108)
Creatinine: 1.12 mg/dL (ref 0.51–1.18)
GFR, Calc, African American: >60 mL/min/{1.73_m2} (ref >59)
GFR, Calc, European American: >60 mL/min/{1.73_m2} (ref >59)
Glucose: 101 mg/dL (ref 62–125)
Potassium: 4 meq/L (ref 3.6–5.2)
Sodium: 135 meq/L (ref 135–145)
Urea Nitrogen: 20 mg/dL (ref 8–21)

## 2016-08-08 DIAGNOSIS — K5900 Constipation, unspecified: Secondary | ICD-10-CM

## 2016-08-09 LAB — BLOOD C/S: Culture: NO GROWTH

## 2016-08-10 DIAGNOSIS — G8911 Acute pain due to trauma: Secondary | ICD-10-CM

## 2016-08-10 LAB — EKG 12 LEAD
Atrial Rate: 94 {beats}/min
Atrial Rate: 94 {beats}/min
Q-T Interval: 374 ms
Q-T Interval: 374 ms
QRS Duration: 100 ms
QRS Duration: 100 ms
QTC Calculation: 445 ms
QTC Calculation: 445 ms
R Axis: 20 degrees
R Axis: 20 degrees
T Axis: 16 degrees
T Axis: 16 degrees
Ventricular Rate: 85 {beats}/min
Ventricular Rate: 85 {beats}/min

## 2016-08-10 LAB — BASIC METABOLIC PANEL
Anion Gap: 4 (ref 4–12)
Calcium: 8.9 mg/dL (ref 8.9–10.2)
Carbon Dioxide, Total: 28 meq/L (ref 22–32)
Chloride: 102 meq/L (ref 98–108)
Creatinine: 1.01 mg/dL (ref 0.51–1.18)
GFR, Calc, African American: >60 mL/min/{1.73_m2} (ref >59)
GFR, Calc, European American: >60 mL/min/{1.73_m2} (ref >59)
Glucose: 118 mg/dL (ref 62–125)
Potassium: 4.5 meq/L (ref 3.6–5.2)
Sodium: 134 meq/L — ABNORMAL LOW (ref 135–145)
Urea Nitrogen: 21 mg/dL (ref 8–21)

## 2016-08-10 LAB — CBC (HEMOGRAM)
Hematocrit: 29 % — ABNORMAL LOW (ref 38–50)
Hemoglobin: 9.6 g/dL — ABNORMAL LOW (ref 13.0–18.0)
MCH: 30.8 pg (ref 27.3–33.6)
MCHC: 33.1 g/dL (ref 32.2–36.5)
MCV: 93 fL (ref 81–98)
Platelet Count: 304 10*3/uL (ref 150–400)
RBC: 3.12 10*6/uL — ABNORMAL LOW (ref 4.40–5.60)
RDW-CV: 15.9 % — ABNORMAL HIGH (ref 11.6–14.4)
WBC: 8.41 10*3/uL (ref 4.3–10.0)

## 2016-08-11 ENCOUNTER — Other Ambulatory Visit (INDEPENDENT_AMBULATORY_CARE_PROVIDER_SITE_OTHER): Payer: Self-pay | Admitting: Family Medicine

## 2016-08-11 DIAGNOSIS — J449 Chronic obstructive pulmonary disease, unspecified: Secondary | ICD-10-CM

## 2016-08-12 ENCOUNTER — Encounter (HOSPITAL_BASED_OUTPATIENT_CLINIC_OR_DEPARTMENT_OTHER): Payer: No Typology Code available for payment source | Admitting: Anesthesiology

## 2016-08-12 MED ORDER — SPIRIVA HANDIHALER 18 MCG IN CAPS
ORAL_CAPSULE | RESPIRATORY_TRACT | 3 refills | Status: AC
Start: 2016-08-12 — End: ?

## 2016-08-20 ENCOUNTER — Encounter (INDEPENDENT_AMBULATORY_CARE_PROVIDER_SITE_OTHER): Payer: No Typology Code available for payment source | Admitting: Family Medicine

## 2016-08-26 ENCOUNTER — Encounter (INDEPENDENT_AMBULATORY_CARE_PROVIDER_SITE_OTHER): Payer: Self-pay | Admitting: Family Medicine

## 2016-08-26 NOTE — Telephone Encounter (Signed)
Generic response sent to patient.    Routing to Dr. Tanner FYI

## 2016-09-03 ENCOUNTER — Encounter (INDEPENDENT_AMBULATORY_CARE_PROVIDER_SITE_OTHER): Payer: Self-pay | Admitting: Family Medicine

## 2016-09-03 DIAGNOSIS — J431 Panlobular emphysema: Secondary | ICD-10-CM

## 2016-09-03 DIAGNOSIS — I25119 Atherosclerotic heart disease of native coronary artery with unspecified angina pectoris: Secondary | ICD-10-CM

## 2016-09-03 NOTE — Telephone Encounter (Signed)
Generic response sent to patient.    Routing to Dr. Satira Sark to advise on oxygen order.

## 2016-09-04 NOTE — Telephone Encounter (Signed)
Requested location information via eCare.  Signed order at [facility] 2 desk ready for faxing.

## 2016-09-04 NOTE — Telephone Encounter (Signed)
Done

## 2016-09-04 NOTE — Telephone Encounter (Signed)
Form printed on clinic letterhead and placed in sign folder.  Basic info filled out in top section.    Routing to Dr. Satira Sark.

## 2016-09-04 NOTE — Telephone Encounter (Signed)
Placed in out going mail    Pt notified

## 2016-09-04 NOTE — Telephone Encounter (Signed)
I have written out the order--can you ask the patient where it should be sent to?  Thanks

## 2016-09-04 NOTE — Telephone Encounter (Signed)
Mailed to pt as requested

## 2016-09-08 ENCOUNTER — Encounter (INDEPENDENT_AMBULATORY_CARE_PROVIDER_SITE_OTHER): Payer: Self-pay | Admitting: Family Medicine

## 2016-09-08 NOTE — Telephone Encounter (Signed)
Patient would have to complete an ROI before we can send all his records. eCare sent to patient with instructions on how to request records. Postponing for any additional follow up.

## 2016-09-08 NOTE — Telephone Encounter (Signed)
Generic response sent to patient.    Routing to FD for assistance. Is there any way to forward this request to [facility] w/out making the patient fill out an ROI?

## 2016-09-09 ENCOUNTER — Encounter (INDEPENDENT_AMBULATORY_CARE_PROVIDER_SITE_OTHER): Payer: Self-pay | Admitting: Family Medicine

## 2016-09-10 NOTE — Telephone Encounter (Signed)
Recent visit notes along with visit notes from Dr. Jari Pigg from [facility] faxed to: [place] as requested. Responded to eCare to inform patient.

## 2016-09-18 ENCOUNTER — Other Ambulatory Visit (INDEPENDENT_AMBULATORY_CARE_PROVIDER_SITE_OTHER): Payer: Self-pay | Admitting: Family Medicine

## 2016-09-18 DIAGNOSIS — J449 Chronic obstructive pulmonary disease, unspecified: Secondary | ICD-10-CM

## 2016-09-21 ENCOUNTER — Encounter (INDEPENDENT_AMBULATORY_CARE_PROVIDER_SITE_OTHER): Payer: Self-pay | Admitting: Family Medicine

## 2016-09-22 ENCOUNTER — Encounter (HOSPITAL_BASED_OUTPATIENT_CLINIC_OR_DEPARTMENT_OTHER): Payer: No Typology Code available for payment source | Admitting: Pulmonary Disease

## 2016-09-22 NOTE — Telephone Encounter (Signed)
Refill Requested from [facility],  Last filled 03/16/16        Comment/Action:appears patient passed on 07-Oct-2016

## 2016-09-22 NOTE — Telephone Encounter (Signed)
eCare message sent to patient  Patient's chart has also been flagged as "Deceased" and ticket was sent to IT    Routing to Dr. Satira Sark

## 2016-09-25 DEATH — deceased

## 2022-12-18 LAB — PATHOLOGY, SURGICAL

## 2023-01-21 LAB — PATHOLOGY, SURGICAL

## 2023-03-16 LAB — PATHOLOGY, SURGICAL
